# Patient Record
Sex: Female | Born: 1935 | Race: White | Hispanic: No | Smoking: Never smoker
Health system: Southern US, Community
[De-identification: ages and names within clinical notes are randomized; demographics above are authoritative.]

## PROBLEM LIST (undated history)

## (undated) DIAGNOSIS — T8859XA Other complications of anesthesia, initial encounter: Secondary | ICD-10-CM

## (undated) DIAGNOSIS — G629 Polyneuropathy, unspecified: Secondary | ICD-10-CM

## (undated) DIAGNOSIS — I10 Essential (primary) hypertension: Secondary | ICD-10-CM

## (undated) DIAGNOSIS — T4145XA Adverse effect of unspecified anesthetic, initial encounter: Secondary | ICD-10-CM

## (undated) DIAGNOSIS — E785 Hyperlipidemia, unspecified: Secondary | ICD-10-CM

## (undated) DIAGNOSIS — K589 Irritable bowel syndrome without diarrhea: Secondary | ICD-10-CM

## (undated) DIAGNOSIS — E039 Hypothyroidism, unspecified: Secondary | ICD-10-CM

## (undated) DIAGNOSIS — K219 Gastro-esophageal reflux disease without esophagitis: Secondary | ICD-10-CM

## (undated) DIAGNOSIS — Z87440 Personal history of urinary (tract) infections: Secondary | ICD-10-CM

## (undated) HISTORY — PX: BLADDER SURGERY: SHX569

## (undated) HISTORY — DX: Hypothyroidism, unspecified: E03.9

## (undated) HISTORY — DX: Gastro-esophageal reflux disease without esophagitis: K21.9

## (undated) HISTORY — DX: Essential (primary) hypertension: I10

## (undated) HISTORY — DX: Hyperlipidemia, unspecified: E78.5

## (undated) HISTORY — DX: Irritable bowel syndrome, unspecified: K58.9

## (undated) HISTORY — DX: Polyneuropathy, unspecified: G62.9

## (undated) HISTORY — PX: FOOT SURGERY: SHX648

## (undated) HISTORY — PX: MASTECTOMY: SHX3

## (undated) HISTORY — PX: NASAL SINUS SURGERY: SHX719

## (undated) HISTORY — PX: ABDOMINAL HYSTERECTOMY: SHX81

## (undated) HISTORY — DX: Personal history of urinary (tract) infections: Z87.440

---

## 1998-04-07 ENCOUNTER — Ambulatory Visit (HOSPITAL_BASED_OUTPATIENT_CLINIC_OR_DEPARTMENT_OTHER): Admission: RE | Admit: 1998-04-07 | Discharge: 1998-04-07 | Payer: Self-pay | Admitting: Plastic Surgery

## 1998-05-19 ENCOUNTER — Encounter (INDEPENDENT_AMBULATORY_CARE_PROVIDER_SITE_OTHER): Payer: Self-pay | Admitting: Gastroenterology

## 1998-05-19 ENCOUNTER — Ambulatory Visit (HOSPITAL_COMMUNITY): Admission: RE | Admit: 1998-05-19 | Discharge: 1998-05-19 | Payer: Self-pay | Admitting: Gastroenterology

## 1998-05-21 ENCOUNTER — Encounter: Payer: Self-pay | Admitting: Internal Medicine

## 1998-05-21 ENCOUNTER — Ambulatory Visit (HOSPITAL_COMMUNITY): Admission: RE | Admit: 1998-05-21 | Discharge: 1998-05-21 | Payer: Self-pay | Admitting: Internal Medicine

## 2000-02-20 ENCOUNTER — Ambulatory Visit (HOSPITAL_BASED_OUTPATIENT_CLINIC_OR_DEPARTMENT_OTHER): Admission: RE | Admit: 2000-02-20 | Discharge: 2000-02-20 | Payer: Self-pay | Admitting: Plastic Surgery

## 2000-04-04 ENCOUNTER — Encounter: Admission: RE | Admit: 2000-04-04 | Discharge: 2000-04-04 | Payer: Self-pay | Admitting: Endocrinology

## 2000-04-04 ENCOUNTER — Encounter: Payer: Self-pay | Admitting: Endocrinology

## 2000-09-25 ENCOUNTER — Encounter: Admission: RE | Admit: 2000-09-25 | Discharge: 2000-09-25 | Payer: Self-pay | Admitting: Gastroenterology

## 2000-09-25 ENCOUNTER — Encounter: Payer: Self-pay | Admitting: Gastroenterology

## 2001-06-04 ENCOUNTER — Encounter: Payer: Self-pay | Admitting: Emergency Medicine

## 2001-06-04 ENCOUNTER — Emergency Department (HOSPITAL_COMMUNITY): Admission: EM | Admit: 2001-06-04 | Discharge: 2001-06-04 | Payer: Self-pay | Admitting: Emergency Medicine

## 2001-07-04 ENCOUNTER — Encounter: Payer: Self-pay | Admitting: Otolaryngology

## 2001-07-04 ENCOUNTER — Encounter: Admission: RE | Admit: 2001-07-04 | Discharge: 2001-07-04 | Payer: Self-pay | Admitting: Otolaryngology

## 2001-07-09 ENCOUNTER — Encounter: Admission: RE | Admit: 2001-07-09 | Discharge: 2001-07-09 | Payer: Self-pay | Admitting: Pulmonary Disease

## 2001-07-09 ENCOUNTER — Encounter: Payer: Self-pay | Admitting: Pulmonary Disease

## 2001-09-19 ENCOUNTER — Encounter: Payer: Self-pay | Admitting: Pulmonary Disease

## 2001-09-19 ENCOUNTER — Ambulatory Visit (HOSPITAL_COMMUNITY): Admission: RE | Admit: 2001-09-19 | Discharge: 2001-09-19 | Payer: Self-pay | Admitting: Pulmonary Disease

## 2002-03-20 ENCOUNTER — Ambulatory Visit (HOSPITAL_COMMUNITY): Admission: RE | Admit: 2002-03-20 | Discharge: 2002-03-20 | Payer: Self-pay | Admitting: Pulmonary Disease

## 2002-03-20 ENCOUNTER — Encounter: Payer: Self-pay | Admitting: Pulmonary Disease

## 2002-06-05 ENCOUNTER — Encounter: Payer: Self-pay | Admitting: Gastroenterology

## 2002-06-05 ENCOUNTER — Ambulatory Visit (HOSPITAL_COMMUNITY): Admission: RE | Admit: 2002-06-05 | Discharge: 2002-06-05 | Payer: Self-pay | Admitting: Gastroenterology

## 2002-10-29 ENCOUNTER — Ambulatory Visit (HOSPITAL_COMMUNITY): Admission: RE | Admit: 2002-10-29 | Discharge: 2002-10-29 | Payer: Self-pay | Admitting: Pulmonary Disease

## 2002-10-29 ENCOUNTER — Encounter: Payer: Self-pay | Admitting: Pulmonary Disease

## 2002-12-26 ENCOUNTER — Inpatient Hospital Stay (HOSPITAL_COMMUNITY): Admission: AD | Admit: 2002-12-26 | Discharge: 2002-12-29 | Payer: Self-pay | Admitting: Emergency Medicine

## 2002-12-29 ENCOUNTER — Inpatient Hospital Stay: Admission: RE | Admit: 2002-12-29 | Discharge: 2003-01-08 | Payer: Self-pay | Admitting: Psychiatry

## 2003-01-13 ENCOUNTER — Encounter: Admission: RE | Admit: 2003-01-13 | Discharge: 2003-04-13 | Payer: Self-pay | Admitting: Endocrinology

## 2003-03-24 ENCOUNTER — Encounter (INDEPENDENT_AMBULATORY_CARE_PROVIDER_SITE_OTHER): Payer: Self-pay | Admitting: Specialist

## 2003-03-24 ENCOUNTER — Ambulatory Visit (HOSPITAL_COMMUNITY): Admission: RE | Admit: 2003-03-24 | Discharge: 2003-03-25 | Payer: Self-pay | Admitting: Otolaryngology

## 2003-05-07 ENCOUNTER — Ambulatory Visit (HOSPITAL_COMMUNITY): Admission: RE | Admit: 2003-05-07 | Discharge: 2003-05-07 | Payer: Self-pay | Admitting: Pulmonary Disease

## 2003-10-07 ENCOUNTER — Encounter (INDEPENDENT_AMBULATORY_CARE_PROVIDER_SITE_OTHER): Payer: Self-pay | Admitting: Gastroenterology

## 2003-10-07 DIAGNOSIS — K5730 Diverticulosis of large intestine without perforation or abscess without bleeding: Secondary | ICD-10-CM | POA: Insufficient documentation

## 2003-11-29 ENCOUNTER — Ambulatory Visit: Payer: Self-pay | Admitting: Pulmonary Disease

## 2003-11-29 ENCOUNTER — Ambulatory Visit: Payer: Self-pay | Admitting: Gastroenterology

## 2003-12-03 ENCOUNTER — Ambulatory Visit (HOSPITAL_COMMUNITY): Admission: RE | Admit: 2003-12-03 | Discharge: 2003-12-03 | Payer: Self-pay | Admitting: Gastroenterology

## 2003-12-14 ENCOUNTER — Ambulatory Visit: Payer: Self-pay | Admitting: Gastroenterology

## 2003-12-16 ENCOUNTER — Encounter: Admission: RE | Admit: 2003-12-16 | Discharge: 2003-12-16 | Payer: Self-pay | Admitting: Rheumatology

## 2004-02-17 ENCOUNTER — Encounter: Admission: RE | Admit: 2004-02-17 | Discharge: 2004-02-17 | Payer: Self-pay | Admitting: Endocrinology

## 2004-02-17 ENCOUNTER — Ambulatory Visit: Payer: Self-pay | Admitting: Pulmonary Disease

## 2004-06-01 ENCOUNTER — Ambulatory Visit: Payer: Self-pay | Admitting: Pulmonary Disease

## 2004-06-02 ENCOUNTER — Ambulatory Visit: Payer: Self-pay | Admitting: Cardiovascular Disease

## 2004-07-19 ENCOUNTER — Ambulatory Visit: Payer: Self-pay | Admitting: Internal Medicine

## 2004-07-26 ENCOUNTER — Encounter: Admission: RE | Admit: 2004-07-26 | Discharge: 2004-07-26 | Payer: Self-pay | Admitting: Endocrinology

## 2004-09-05 ENCOUNTER — Ambulatory Visit: Payer: Self-pay | Admitting: Internal Medicine

## 2004-10-05 ENCOUNTER — Ambulatory Visit: Payer: Self-pay | Admitting: Pulmonary Disease

## 2004-10-05 LAB — PULMONARY FUNCTION TEST

## 2004-10-27 ENCOUNTER — Ambulatory Visit: Payer: Self-pay | Admitting: Pulmonary Disease

## 2004-11-01 ENCOUNTER — Ambulatory Visit: Payer: Self-pay | Admitting: Pulmonary Disease

## 2004-11-29 ENCOUNTER — Ambulatory Visit: Payer: Self-pay | Admitting: Pulmonary Disease

## 2004-12-29 ENCOUNTER — Ambulatory Visit: Payer: Self-pay | Admitting: Pulmonary Disease

## 2005-01-05 ENCOUNTER — Ambulatory Visit: Payer: Self-pay | Admitting: Pulmonary Disease

## 2005-02-22 ENCOUNTER — Ambulatory Visit: Payer: Self-pay | Admitting: Emergency Medicine

## 2005-03-27 ENCOUNTER — Ambulatory Visit: Payer: Self-pay | Admitting: Pulmonary Disease

## 2005-04-24 ENCOUNTER — Ambulatory Visit: Payer: Self-pay | Admitting: Critical Care Medicine

## 2005-05-14 ENCOUNTER — Inpatient Hospital Stay (HOSPITAL_COMMUNITY): Admission: EM | Admit: 2005-05-14 | Discharge: 2005-05-16 | Payer: Self-pay | Admitting: Emergency Medicine

## 2005-05-15 ENCOUNTER — Encounter (INDEPENDENT_AMBULATORY_CARE_PROVIDER_SITE_OTHER): Payer: Self-pay | Admitting: Cardiovascular Disease

## 2005-07-06 ENCOUNTER — Ambulatory Visit: Payer: Self-pay | Admitting: Pulmonary Disease

## 2005-07-06 ENCOUNTER — Inpatient Hospital Stay (HOSPITAL_COMMUNITY): Admission: EM | Admit: 2005-07-06 | Discharge: 2005-07-16 | Payer: Self-pay | Admitting: *Deleted

## 2005-07-10 ENCOUNTER — Encounter (INDEPENDENT_AMBULATORY_CARE_PROVIDER_SITE_OTHER): Payer: Self-pay | Admitting: Cardiology

## 2005-07-10 ENCOUNTER — Ambulatory Visit: Payer: Self-pay | Admitting: Infectious Diseases

## 2005-07-26 ENCOUNTER — Ambulatory Visit: Payer: Self-pay | Admitting: Pulmonary Disease

## 2005-10-24 ENCOUNTER — Ambulatory Visit: Payer: Self-pay | Admitting: Pulmonary Disease

## 2005-11-26 ENCOUNTER — Ambulatory Visit: Payer: Self-pay | Admitting: Pulmonary Disease

## 2005-12-04 ENCOUNTER — Emergency Department (HOSPITAL_COMMUNITY): Admission: EM | Admit: 2005-12-04 | Discharge: 2005-12-04 | Payer: Self-pay | Admitting: Emergency Medicine

## 2005-12-18 ENCOUNTER — Ambulatory Visit (HOSPITAL_COMMUNITY): Admission: RE | Admit: 2005-12-18 | Discharge: 2005-12-18 | Payer: Self-pay | Admitting: Pulmonary Disease

## 2005-12-18 ENCOUNTER — Ambulatory Visit: Payer: Self-pay | Admitting: Pulmonary Disease

## 2005-12-31 ENCOUNTER — Ambulatory Visit: Payer: Self-pay | Admitting: Pulmonary Disease

## 2005-12-31 ENCOUNTER — Ambulatory Visit: Payer: Self-pay | Admitting: Internal Medicine

## 2006-01-24 ENCOUNTER — Ambulatory Visit: Payer: Self-pay | Admitting: Gastroenterology

## 2006-02-01 ENCOUNTER — Ambulatory Visit: Payer: Self-pay | Admitting: Internal Medicine

## 2006-03-06 ENCOUNTER — Ambulatory Visit: Payer: Self-pay | Admitting: Pulmonary Disease

## 2006-03-11 ENCOUNTER — Ambulatory Visit: Payer: Self-pay | Admitting: Pulmonary Disease

## 2006-03-11 ENCOUNTER — Ambulatory Visit (HOSPITAL_COMMUNITY): Admission: RE | Admit: 2006-03-11 | Discharge: 2006-03-11 | Payer: Self-pay | Admitting: Pulmonary Disease

## 2006-03-22 ENCOUNTER — Ambulatory Visit: Payer: Self-pay | Admitting: Cardiology

## 2006-03-28 ENCOUNTER — Ambulatory Visit: Payer: Self-pay | Admitting: Pulmonary Disease

## 2006-04-03 ENCOUNTER — Ambulatory Visit: Payer: Self-pay | Admitting: Pulmonary Disease

## 2006-04-24 ENCOUNTER — Ambulatory Visit: Payer: Self-pay | Admitting: Pulmonary Disease

## 2006-05-06 ENCOUNTER — Ambulatory Visit: Payer: Self-pay | Admitting: Pulmonary Disease

## 2006-05-30 ENCOUNTER — Ambulatory Visit: Payer: Self-pay | Admitting: Pulmonary Disease

## 2006-07-02 ENCOUNTER — Ambulatory Visit: Payer: Self-pay | Admitting: Pulmonary Disease

## 2006-08-06 ENCOUNTER — Ambulatory Visit: Payer: Self-pay | Admitting: Pulmonary Disease

## 2006-09-09 ENCOUNTER — Ambulatory Visit: Payer: Self-pay | Admitting: Internal Medicine

## 2006-11-01 ENCOUNTER — Ambulatory Visit: Payer: Self-pay | Admitting: Pulmonary Disease

## 2006-11-05 ENCOUNTER — Ambulatory Visit: Payer: Self-pay | Admitting: Gastroenterology

## 2006-12-16 ENCOUNTER — Ambulatory Visit: Payer: Self-pay | Admitting: Internal Medicine

## 2006-12-25 ENCOUNTER — Ambulatory Visit: Payer: Self-pay | Admitting: Gastroenterology

## 2006-12-25 LAB — CONVERTED CEMR LAB
Fecal Occult Blood: NEGATIVE
OCCULT 1: NEGATIVE
OCCULT 3: NEGATIVE
OCCULT 4: NEGATIVE
OCCULT 5: NEGATIVE

## 2007-01-01 DIAGNOSIS — J449 Chronic obstructive pulmonary disease, unspecified: Secondary | ICD-10-CM

## 2007-01-01 DIAGNOSIS — E039 Hypothyroidism, unspecified: Secondary | ICD-10-CM

## 2007-01-01 DIAGNOSIS — I10 Essential (primary) hypertension: Secondary | ICD-10-CM | POA: Insufficient documentation

## 2007-01-01 DIAGNOSIS — I519 Heart disease, unspecified: Secondary | ICD-10-CM

## 2007-01-01 DIAGNOSIS — J018 Other acute sinusitis: Secondary | ICD-10-CM

## 2007-01-01 DIAGNOSIS — K219 Gastro-esophageal reflux disease without esophagitis: Secondary | ICD-10-CM

## 2007-01-01 DIAGNOSIS — J309 Allergic rhinitis, unspecified: Secondary | ICD-10-CM | POA: Insufficient documentation

## 2007-01-01 DIAGNOSIS — K589 Irritable bowel syndrome without diarrhea: Secondary | ICD-10-CM

## 2007-01-02 ENCOUNTER — Telehealth: Payer: Self-pay | Admitting: Adult Health

## 2007-01-03 ENCOUNTER — Ambulatory Visit: Payer: Self-pay | Admitting: Pulmonary Disease

## 2007-01-03 DIAGNOSIS — J209 Acute bronchitis, unspecified: Secondary | ICD-10-CM | POA: Insufficient documentation

## 2007-01-10 ENCOUNTER — Emergency Department (HOSPITAL_COMMUNITY): Admission: EM | Admit: 2007-01-10 | Discharge: 2007-01-10 | Payer: Self-pay | Admitting: Emergency Medicine

## 2007-01-10 ENCOUNTER — Telehealth (INDEPENDENT_AMBULATORY_CARE_PROVIDER_SITE_OTHER): Payer: Self-pay | Admitting: *Deleted

## 2007-01-28 ENCOUNTER — Encounter: Payer: Self-pay | Admitting: Cardiology

## 2007-02-03 ENCOUNTER — Ambulatory Visit: Payer: Self-pay | Admitting: Pulmonary Disease

## 2007-02-03 DIAGNOSIS — J329 Chronic sinusitis, unspecified: Secondary | ICD-10-CM | POA: Insufficient documentation

## 2007-02-21 ENCOUNTER — Ambulatory Visit (HOSPITAL_COMMUNITY): Admission: RE | Admit: 2007-02-21 | Discharge: 2007-02-22 | Payer: Self-pay | Admitting: Otolaryngology

## 2007-02-21 ENCOUNTER — Encounter (INDEPENDENT_AMBULATORY_CARE_PROVIDER_SITE_OTHER): Payer: Self-pay | Admitting: Otolaryngology

## 2007-02-24 ENCOUNTER — Emergency Department (HOSPITAL_COMMUNITY): Admission: EM | Admit: 2007-02-24 | Discharge: 2007-02-24 | Payer: Self-pay | Admitting: Emergency Medicine

## 2007-03-11 ENCOUNTER — Emergency Department (HOSPITAL_COMMUNITY): Admission: EM | Admit: 2007-03-11 | Discharge: 2007-03-11 | Payer: Self-pay | Admitting: Emergency Medicine

## 2007-03-20 ENCOUNTER — Telehealth (INDEPENDENT_AMBULATORY_CARE_PROVIDER_SITE_OTHER): Payer: Self-pay | Admitting: *Deleted

## 2007-03-25 ENCOUNTER — Ambulatory Visit: Payer: Self-pay | Admitting: Pulmonary Disease

## 2007-04-15 ENCOUNTER — Ambulatory Visit: Payer: Self-pay | Admitting: Pulmonary Disease

## 2007-04-21 ENCOUNTER — Ambulatory Visit: Payer: Self-pay | Admitting: Pulmonary Disease

## 2007-05-09 ENCOUNTER — Ambulatory Visit: Payer: Self-pay | Admitting: Pulmonary Disease

## 2007-05-09 ENCOUNTER — Telehealth: Payer: Self-pay | Admitting: Pulmonary Disease

## 2007-05-26 ENCOUNTER — Telehealth (INDEPENDENT_AMBULATORY_CARE_PROVIDER_SITE_OTHER): Payer: Self-pay | Admitting: *Deleted

## 2007-05-26 ENCOUNTER — Encounter: Payer: Self-pay | Admitting: Pulmonary Disease

## 2007-06-05 ENCOUNTER — Telehealth (INDEPENDENT_AMBULATORY_CARE_PROVIDER_SITE_OTHER): Payer: Self-pay | Admitting: *Deleted

## 2007-06-13 ENCOUNTER — Ambulatory Visit: Payer: Self-pay | Admitting: Pulmonary Disease

## 2007-06-25 ENCOUNTER — Telehealth (INDEPENDENT_AMBULATORY_CARE_PROVIDER_SITE_OTHER): Payer: Self-pay | Admitting: *Deleted

## 2007-06-25 ENCOUNTER — Ambulatory Visit: Payer: Self-pay | Admitting: Pulmonary Disease

## 2007-06-30 ENCOUNTER — Telehealth: Payer: Self-pay | Admitting: Pulmonary Disease

## 2007-06-30 ENCOUNTER — Ambulatory Visit: Payer: Self-pay | Admitting: Pulmonary Disease

## 2007-06-30 DIAGNOSIS — R079 Chest pain, unspecified: Secondary | ICD-10-CM | POA: Insufficient documentation

## 2007-08-01 ENCOUNTER — Ambulatory Visit: Payer: Self-pay | Admitting: Pulmonary Disease

## 2007-09-02 ENCOUNTER — Ambulatory Visit: Payer: Self-pay | Admitting: Pulmonary Disease

## 2007-09-25 ENCOUNTER — Ambulatory Visit: Payer: Self-pay | Admitting: Pulmonary Disease

## 2007-10-06 ENCOUNTER — Ambulatory Visit: Payer: Self-pay | Admitting: Pulmonary Disease

## 2007-11-10 ENCOUNTER — Ambulatory Visit: Payer: Self-pay | Admitting: Pulmonary Disease

## 2007-11-28 ENCOUNTER — Encounter: Admission: RE | Admit: 2007-11-28 | Discharge: 2007-11-28 | Payer: Self-pay | Admitting: Endocrinology

## 2007-12-04 ENCOUNTER — Ambulatory Visit: Payer: Self-pay | Admitting: Pulmonary Disease

## 2008-01-06 ENCOUNTER — Ambulatory Visit: Payer: Self-pay | Admitting: Internal Medicine

## 2008-01-29 ENCOUNTER — Ambulatory Visit: Payer: Self-pay | Admitting: Pulmonary Disease

## 2008-02-25 ENCOUNTER — Ambulatory Visit: Payer: Self-pay | Admitting: Pulmonary Disease

## 2008-02-25 DIAGNOSIS — J45909 Unspecified asthma, uncomplicated: Secondary | ICD-10-CM | POA: Insufficient documentation

## 2008-03-03 ENCOUNTER — Ambulatory Visit: Payer: Self-pay | Admitting: Pulmonary Disease

## 2008-03-05 DIAGNOSIS — J45909 Unspecified asthma, uncomplicated: Secondary | ICD-10-CM | POA: Insufficient documentation

## 2008-04-06 ENCOUNTER — Ambulatory Visit: Payer: Self-pay | Admitting: Pulmonary Disease

## 2008-04-30 ENCOUNTER — Ambulatory Visit: Payer: Self-pay | Admitting: Pulmonary Disease

## 2008-06-02 ENCOUNTER — Ambulatory Visit: Payer: Self-pay | Admitting: Pulmonary Disease

## 2008-06-30 ENCOUNTER — Ambulatory Visit: Payer: Self-pay | Admitting: Pulmonary Disease

## 2008-07-21 ENCOUNTER — Emergency Department (HOSPITAL_COMMUNITY): Admission: EM | Admit: 2008-07-21 | Discharge: 2008-07-21 | Payer: Self-pay

## 2008-07-30 ENCOUNTER — Encounter: Payer: Self-pay | Admitting: Internal Medicine

## 2008-07-30 ENCOUNTER — Ambulatory Visit: Payer: Self-pay | Admitting: Pulmonary Disease

## 2008-08-26 ENCOUNTER — Ambulatory Visit: Payer: Self-pay | Admitting: Pulmonary Disease

## 2008-08-30 ENCOUNTER — Emergency Department (HOSPITAL_COMMUNITY): Admission: EM | Admit: 2008-08-30 | Discharge: 2008-08-30 | Payer: Self-pay | Admitting: Emergency Medicine

## 2008-09-28 ENCOUNTER — Ambulatory Visit: Payer: Self-pay | Admitting: Pulmonary Disease

## 2008-10-06 ENCOUNTER — Encounter: Payer: Self-pay | Admitting: Cardiology

## 2008-11-01 ENCOUNTER — Ambulatory Visit: Payer: Self-pay | Admitting: Pulmonary Disease

## 2008-11-03 ENCOUNTER — Telehealth: Payer: Self-pay | Admitting: Gastroenterology

## 2008-11-05 ENCOUNTER — Encounter: Payer: Self-pay | Admitting: Gastroenterology

## 2008-11-30 ENCOUNTER — Telehealth (INDEPENDENT_AMBULATORY_CARE_PROVIDER_SITE_OTHER): Payer: Self-pay | Admitting: *Deleted

## 2008-12-01 ENCOUNTER — Ambulatory Visit: Payer: Self-pay | Admitting: Pulmonary Disease

## 2008-12-06 ENCOUNTER — Telehealth (INDEPENDENT_AMBULATORY_CARE_PROVIDER_SITE_OTHER): Payer: Self-pay | Admitting: *Deleted

## 2008-12-15 ENCOUNTER — Encounter: Admission: RE | Admit: 2008-12-15 | Discharge: 2008-12-15 | Payer: Self-pay | Admitting: Gastroenterology

## 2009-01-03 ENCOUNTER — Ambulatory Visit: Payer: Self-pay | Admitting: Pulmonary Disease

## 2009-02-01 ENCOUNTER — Ambulatory Visit: Payer: Self-pay | Admitting: Pulmonary Disease

## 2009-03-03 ENCOUNTER — Ambulatory Visit: Payer: Self-pay | Admitting: Pulmonary Disease

## 2009-03-29 ENCOUNTER — Ambulatory Visit: Payer: Self-pay | Admitting: Pulmonary Disease

## 2009-03-29 DIAGNOSIS — J019 Acute sinusitis, unspecified: Secondary | ICD-10-CM

## 2009-03-31 ENCOUNTER — Telehealth (INDEPENDENT_AMBULATORY_CARE_PROVIDER_SITE_OTHER): Payer: Self-pay | Admitting: *Deleted

## 2009-05-05 ENCOUNTER — Ambulatory Visit: Payer: Self-pay | Admitting: Pulmonary Disease

## 2009-05-13 ENCOUNTER — Encounter: Admission: RE | Admit: 2009-05-13 | Discharge: 2009-05-13 | Payer: Self-pay | Admitting: Neurology

## 2009-05-13 ENCOUNTER — Encounter: Payer: Self-pay | Admitting: Pulmonary Disease

## 2009-05-18 ENCOUNTER — Telehealth: Payer: Self-pay | Admitting: Pulmonary Disease

## 2009-05-20 ENCOUNTER — Encounter: Admission: RE | Admit: 2009-05-20 | Discharge: 2009-05-20 | Payer: Self-pay | Admitting: Neurology

## 2009-06-01 ENCOUNTER — Ambulatory Visit: Payer: Self-pay | Admitting: Pulmonary Disease

## 2009-07-04 ENCOUNTER — Ambulatory Visit: Payer: Self-pay | Admitting: Pulmonary Disease

## 2009-08-01 ENCOUNTER — Ambulatory Visit: Payer: Self-pay | Admitting: Pulmonary Disease

## 2009-08-03 ENCOUNTER — Ambulatory Visit: Payer: Self-pay | Admitting: Pulmonary Disease

## 2009-08-03 ENCOUNTER — Telehealth (INDEPENDENT_AMBULATORY_CARE_PROVIDER_SITE_OTHER): Payer: Self-pay | Admitting: *Deleted

## 2009-08-03 ENCOUNTER — Telehealth: Payer: Self-pay | Admitting: Pulmonary Disease

## 2009-08-03 ENCOUNTER — Ambulatory Visit: Payer: Self-pay | Admitting: Cardiology

## 2009-08-03 DIAGNOSIS — R0602 Shortness of breath: Secondary | ICD-10-CM | POA: Insufficient documentation

## 2009-08-03 DIAGNOSIS — R071 Chest pain on breathing: Secondary | ICD-10-CM

## 2009-08-03 LAB — CONVERTED CEMR LAB
BUN: 15 mg/dL (ref 6–23)
CO2: 29 meq/L (ref 19–32)
Calcium: 9.1 mg/dL (ref 8.4–10.5)
Chloride: 97 meq/L (ref 96–112)
Potassium: 4.1 meq/L (ref 3.5–5.1)
Sodium: 132 meq/L — ABNORMAL LOW (ref 135–145)

## 2009-09-05 ENCOUNTER — Ambulatory Visit: Payer: Self-pay | Admitting: Pulmonary Disease

## 2009-10-10 ENCOUNTER — Ambulatory Visit: Payer: Self-pay | Admitting: Pulmonary Disease

## 2009-10-18 ENCOUNTER — Ambulatory Visit: Payer: Self-pay | Admitting: Pulmonary Disease

## 2009-11-08 ENCOUNTER — Ambulatory Visit: Payer: Self-pay | Admitting: Pulmonary Disease

## 2010-01-24 ENCOUNTER — Ambulatory Visit
Admission: RE | Admit: 2010-01-24 | Discharge: 2010-01-24 | Payer: Self-pay | Source: Home / Self Care | Attending: Pulmonary Disease | Admitting: Pulmonary Disease

## 2010-02-04 ENCOUNTER — Encounter: Payer: Self-pay | Admitting: Gastroenterology

## 2010-02-05 ENCOUNTER — Encounter: Payer: Self-pay | Admitting: Endocrinology

## 2010-02-14 NOTE — Assessment & Plan Note (Signed)
Summary: Joanna [name]   Nurse Visit   Allergies: 1)  ! Sulfa 2)  ! Biaxin 3)  ! Prednisone 4)  ! * Amitriptyline 5)  ! Caffeine 6)  ! Codeine 7)  ! Ceftin 8)  ! * Latex  Medication Administration  Injection # 1:    Medication: Xolair (omalizumab) 150mg     Diagnosis: EXTRINSIC ASTHMA, UNSPECIFIED (ICD-493.00)    Route: SQ    Site: L deltoid    Exp Date: 05/15/2012    Lot #: [id number]    Mfr: GENENTECH    Comments: 1.2 ML IN LEFT PT DIDNT WAIT CHARGED [id number] AND [id number]    Given by: Dimas Millin IN ALLERGY LAB   Medication Administration  Injection # 1:    Medication: Xolair (omalizumab) 150mg     Diagnosis: EXTRINSIC ASTHMA, UNSPECIFIED (ICD-493.00)    Route: SQ    Site: L deltoid    Exp Date: 05/15/2012    Lot #: [id number]    Mfr: GENENTECH    Comments: 1.2 ML IN LEFT PT DIDNT WAIT CHARGED [id number] AND [id number]    Given by: Babette Relic SCOTT IN ALLERGY LAB

## 2010-02-14 NOTE — Assessment & Plan Note (Signed)
Summary: rov for asthma   Visit Type:  Follow-up Primary Provider/Referring Provider:  Dr. Lucianne Muss  CC:  follow up. Pt states her breathing is getting better. Pt states she now has very little sob with activity and very little sob at rest. Pt states she occasionally gets a bad coughing spell wih green-yellow phlem. Pt states symbicort helps her out a lot. Pt states she still has that spot on her back that bothers her. Pt states she would like flu shot today.  Marland Kitchen  History of Present Illness: The pt comes in today for f/u of her known asthma.  She is being maintained on symbicort, as well as xolair injections.  She also has a h/o chronic rhinosinusitis, but has been better on xolair and sinus rinses.  She feels that her breathing is stable, denies any worsening sob.  She has occasional cough with scant discolored mucus.  Current Medications (verified): 1)  Synthroid 125 Mcg Tabs (Levothyroxine Sodium) .... Take 1/2 Tab Once Daily 2)  Neurontin 300 Mg  Caps (Gabapentin) .... Take 2 Tabs By Mouth Three Times A Day 3)  Calcium 600 1500 Mg  Tabs (Calcium Carbonate) .... Take 2 Tablet By Mouth Once A Day 4)  Amlodipine Besylate 5 Mg Tabs (Amlodipine Besylate) .... Take 1 Tab By Mouth Daily 5)  Toprol Xl 50 Mg  Tb24 (Metoprolol Succinate) .... Take 1 Tablet By Mouth Once A Day 6)  Fiber Complete   Tabs (Fiber) .... Take 4 To 6 Tabs By Mouth Once Daily 7)  Proair Hfa 108 (90 Base) Mcg/act  Aers (Albuterol Sulfate) .... Inhale 2 Puffs Four Times A Day As Needed 8)  Symbicort 160-4.5 Mcg/act  Aero (Budesonide-Formoterol Fumarate) .... Two Puffs Twice Daily..rinse Mouth 9)  Sinus Rinse .... Use Two Times A Day 10)  Simvastatin 5 Mg  Tabs (Simvastatin) .... Take 1 Tablet By Mouth Once A Day 11)  Aspirin Low Dose 81 Mg  Tabs (Aspirin) .... Take 1 Tablet By Mouth Once A Day 12)  Alprazolam .... Pt Unsure of Strength. Take 1 Tab By Mouth At Bedtime 13)  Omeprazole .... Take 1 Tablet By Mouth Once A Day 14)   Losartan Potassium 50 Mg Tabs (Losartan Potassium) .... Take 1 Tablet By Mouth Once A Day  Allergies (verified): 1)  ! Sulfa 2)  ! Biaxin 3)  ! Prednisone 4)  ! * Amitriptyline 5)  ! Caffeine 6)  ! Codeine 7)  ! Ceftin 8)  ! * Latex  Review of Systems       The patient complains of shortness of breath with activity, shortness of breath at rest, productive cough, acid heartburn, difficulty swallowing, nasal congestion/difficulty breathing through nose, sneezing, and joint stiffness or pain.  The patient denies non-productive cough, coughing up blood, chest pain, irregular heartbeats, indigestion, loss of appetite, weight change, abdominal pain, sore throat, tooth/dental problems, headaches, itching, ear ache, anxiety, depression, hand/feet swelling, rash, change in color of mucus, and fever.    Vital Signs:  Patient profile:   77 year old female Height:      67 inches Weight:      145.25 pounds BMI:     22.83 O2 Sat:      100 % on Room air Temp:     97.7 degrees F oral Pulse rate:   70 / minute BP sitting:   120 / 64  (left arm) Cuff size:   regular  Vitals Entered By: Carver Fila (October 18, 2009 11:19 AM)  O2 Flow:  Room air CC: follow up. Pt states her breathing is getting better. Pt states she now has very little sob with activity and very little sob at rest. Pt states she occasionally gets a bad coughing spell wih green-yellow phlem. Pt states symbicort helps her out a lot. Pt states she still has that spot on her back that bothers her. Pt states she would like flu shot today.   Comments meds and allergies updated Phone number updated Carver Fila  October 18, 2009 11:19 AM    Physical Exam  General:  wd female in nad Nose:  no purulence or drainage noted. Mouth:  clear, no thrush Lungs:  totally clear to auscultation, no wheezing Heart:  rrr, no mrg Extremities:  no edema or cyanosis  Neurologic:  alert and oriented, moves all 4.   Impression &  Recommendations:  Problem # 1:  INTRINSIC ASTHMA, UNSPECIFIED (ICD-493.10) the pt is doing fairly well on her current asthma regimen.  She has breakthru at times, but is only using symbicort once a day.  I have asked her to use twice a day, but I think she is concerned about the expense.  I discussed with her the pt assistance program.  I have asked her to also stay on the xolair, and to keep as active as possible.  Medications Added to Medication List This Visit: 1)  Amlodipine Besylate 5 Mg Tabs (Amlodipine besylate) .... Take 1 tab by mouth daily  Other Orders: Est. Patient Level III (16109) Flu Vaccine 17yrs + MEDICARE PATIENTS (U0454) Administration Flu vaccine - MCR (U9811)  Patient Instructions: 1)  stay on symbicort, but take twice a day!! 2)  if you are having a hard time affording the symbicort, let us know and we can look into the patient assistance program 3)  stay on xolair 4)  followup with me in 6mos.   Immunization History:  Pneumovax Immunization History:    Pneumovax:  historical (10/15/2005)       Flu Vaccine Consent Questions     Do you have a history of severe allergic reactions to this vaccine? no    Any prior history of allergic reactions to egg and/or gelatin? no    Do you have a sensitivity to the preservative Thimersol? no    Do you have a past history of Guillan-Barre Syndrome? no    Do you currently have an acute febrile illness? no    Have you ever had a severe reaction to latex? no    Vaccine information given and explained to patient? yes    Are you currently pregnant? no    Lot Number:[id number]   Exp Date:07/15/2010   Manufacturer: [employer]    Site Given  Left Deltoid IMlu Vernie Murders  October 18, 2009 12:09 PM

## 2010-02-14 NOTE — Assessment & Plan Note (Signed)
Summary: xolair///kp   Nurse Visit   Allergies: 1)  ! Sulfa 2)  ! Biaxin 3)  ! Prednisone 4)  ! * Amitriptyline 5)  ! Caffeine 6)  ! Codeine 7)  ! Ceftin 8)  ! * Latex  Medication Administration  Injection # 1:    Medication: Xolair (omalizumab) 150mg     Diagnosis: EXTRINSIC ASTHMA, UNSPECIFIED (ICD-493.00)    Route: SQ    Site: L deltoid    Exp Date: 01/2012    Lot #: [id number]    Mfr: Mendel Ryder    Comments: Injection given by Dimas Millin in allergy lab. Xolair 150mg . 1.38ml x 1 in Left Deltoid. Pt did not wait.   Orders Added: 1)  Admin of Therapeutic Inj  intramuscular or subcutaneous [96372] 2)  Xolair (omalizumab) 150mg  [J2357]

## 2010-02-14 NOTE — Progress Notes (Signed)
Summary: SOB-req to be seen- call back asap  Phone Note Call from Patient Call back at [facility] Phone [phone]   Caller: Patient Call For: clance Summary of Call: pt states that she's in "a lot of pain" and wants to be seen today- wants to see kc for SOB. says she thinks it's pleurisy- pain under L shoulder and behind her back- has had this before. wants a call back asap this am.  Initial call taken by: Tivis Ringer, CNA,  August 03, 2009 11:18 AM  Follow-up for Phone Call        megan pt is requesting to see [facility] today---please advise Randell Loop CMA  August 03, 2009 11:19 AM    [facility], do you want to see this pt today or have another physican see pt since you have a dr's appt today?  Aundra Millet Reynolds LPN  August 03, 2009 11:58 AM   per [facility], add her on at 2pm today and cxr prior to appt.  Aundra Millet Reynolds LPN  August 03, 2009 12:00 PM   Additional Follow-up for Phone Call Additional follow up Details #1::        Pt aware of appt time and knows to be at the office by 130 and go to xray first. Appts are in [facility].Reynaldo Minium CMA  August 03, 2009 12:08 PM

## 2010-02-14 NOTE — Assessment & Plan Note (Signed)
Summary: xolair///kp   Nurse Visit   Allergies: 1)  ! Sulfa 2)  ! Biaxin 3)  ! Prednisone 4)  ! * Amitriptyline 5)  ! Caffeine 6)  ! Codeine 7)  ! Ceftin 8)  ! * Latex  Medication Administration  Injection # 1:    Medication: Xolair (omalizumab) 150mg     Diagnosis: EXTRINSIC ASTHMA, UNSPECIFIED (ICD-493.00)    Route: IM    Site: R deltoid    Exp Date: 08/15/2012    Lot #: [id number]    Mfr: Genetech    Comments: xolair 150 mg, 30 units, 1.2 ml in Right deltoid Pt waited 20 mins.    Given by: Carver Fila, clinical medical assistant  Orders Added: 1)  Xolair (omalizumab) 150mg  [J2357] 2)  Administration xolair injection [76811]   Medication Administration  Injection # 1:    Medication: Xolair (omalizumab) 150mg     Diagnosis: EXTRINSIC ASTHMA, UNSPECIFIED (ICD-493.00)    Route: IM    Site: R deltoid    Exp Date: 08/15/2012    Lot #: [id number]    Mfr: Genetech    Comments: xolair 150 mg, 30 units, 1.2 ml in Right deltoid Pt waited 20 mins.    Given by: Carver Fila, clinical medical assistant  Orders Added: 1)  Xolair (omalizumab) 150mg  [J2357] 2)  Administration xolair injection [phone]

## 2010-02-14 NOTE — Assessment & Plan Note (Signed)
Summary: acute sick visit for chest pain.   Primary Provider/Referring Provider:  Dr. Lucianne Muss  CC:  Pt is here for a sick visit.   Pt c/o L sided back pain x 1 week.  Pt states pain is worse if she tries to take a deep breath or cough.  Pt also c/o coughing up yellow to green sputum but states it is difficult to cough up. Marland Kitchen  History of Present Illness: the pt comes in today for an acute sick visit.  She noted the onset of pleuritic like chest pain starting one week ago.  It was gradual in onset, but has worsened and is persistent.  The pain is posterior on the left, and under the left scapula.  It is worse with deep breathing, but she is unsure if movement makes it worse.  She can press on the area and somewhat reproduce the pain.  She also has had a cough with scant discolored mucus, but no chest congestion or fever.  She does not feel she is developing a chest cold.  She has had no LE edema or calf pain, nor has she been on a long trip where she was sitting for a prolonged period of time.    Current Medications (verified): 1)  Synthroid 125 Mcg Tabs (Levothyroxine Sodium) .... Take 1/2 Tab Once Daily 2)  Neurontin 300 Mg  Caps (Gabapentin) .... Take 2 Tabs By Mouth Three Times A Day 3)  Calcium 600 1500 Mg  Tabs (Calcium Carbonate) .... Take 2 Tablet By Mouth Once A Day 4)  Amlodipine Besylate 5 Mg Tabs (Amlodipine Besylate) .... Take 1/2 Tab By Mouth Daily 5)  Toprol Xl 50 Mg  Tb24 (Metoprolol Succinate) .... Take 1 Tablet By Mouth Once A Day 6)  Fiber Complete   Tabs (Fiber) .... Take 4 To 6 Tabs By Mouth Once Daily 7)  Proair Hfa 108 (90 Base) Mcg/act  Aers (Albuterol Sulfate) .... Inhale 2 Puffs Four Times A Day As Needed 8)  Symbicort 160-4.5 Mcg/act  Aero (Budesonide-Formoterol Fumarate) .... Two Puffs Twice Daily..rinse Mouth 9)  Sinus Rinse .... Use Two Times A Day 10)  Simvastatin 5 Mg  Tabs (Simvastatin) .... Take 1 Tablet By Mouth Once A Day 11)  Aspirin Low Dose 81 Mg  Tabs (Aspirin)  .... Take 1 Tablet By Mouth Once A Day 12)  Alprazolam .... Pt Unsure of Strength. Take 1 Tab By Mouth At Bedtime 13)  Omeprazole .... Take 1 Tablet By Mouth Once A Day 14)  Losartan Potassium 50 Mg Tabs (Losartan Potassium) .... Take 1 Tablet By Mouth Once A Day  Allergies (verified): 1)  ! Sulfa 2)  ! Biaxin 3)  ! Prednisone 4)  ! * Amitriptyline 5)  ! Caffeine 6)  ! Codeine 7)  ! Ceftin 8)  ! * Latex  Past History:  Past medical, surgical, family and social histories (including risk factors) reviewed, and no changes noted (except as noted below).  Past Medical History: Reviewed history from 09/28/2008 and no changes required.  IRRITABLE BOWEL SYNDROME (ICD-564.1) HYPOTHYROIDISM (ICD-244.9) GERD (ICD-530.81) HYPERTENSION (ICD-401.9) Hx of RHINOSINUSITIS, ACUTE (ICD-461.8) ALLERGIC RHINITIS (ICD-477.9) ASTHMA (ICD-493.90)    Family History: Reviewed history and no changes required.  Social History: Reviewed history from 03/29/2009 and no changes required. Marital Status: widowed Children: 2 sons Occupation: unemployeed  Review of Systems       The patient complains of shortness of breath at rest, non-productive cough, and chest pain.  The patient denies shortness  of breath with activity, coughing up blood, irregular heartbeats, acid heartburn, indigestion, loss of appetite, weight change, abdominal pain, difficulty swallowing, sore throat, tooth/dental problems, headaches, sneezing, itching, ear ache, anxiety, depression, hand/feet swelling, joint stiffness or pain, rash, fever, productive cough, nasal congestion/difficulty breathing through nose, and change in color of mucus.    Vital Signs:  Patient profile:   77 year old female Height:      67 inches Weight:      142.38 pounds BMI:     22.38 O2 Sat:      96 % on Room air Temp:     97.7 degrees F oral Pulse rate:   77 / minute BP sitting:   104 / 60  (left arm) Cuff size:   regular  Vitals Entered By: Arman Filter LPN (August 03, 2009 1:59 PM)  O2 Flow:  Room air CC: Pt is here for a sick visit.   Pt c/o L sided back pain x 1 week.  Pt states pain is worse if she tries to take a deep breath or cough.  Pt also c/o coughing up yellow to green sputum but states it is difficult to cough up.  Comments Medications reviewed with patient Arman Filter LPN  August 03, 2009 1:59 PM    Physical Exam  General:  thin female in nad Nose:  no purulence or drainage noted. Lungs:  splinting to the left due to pain clear lung fields except for mild left basilar crackles. Heart:  rrr, no mrg Extremities:  no edema or calf tenderness, varicosities present  Neurologic:  alert and oriented, moves all 4.   Impression & Recommendations:  Problem # 1:  CHEST PAIN, PLEURITIC (ICD-786.52) the pt is having acute onset of pleuritic chest pain that I suspect is MSK in origin, but I think we need to r/o PE and other pleural processes given the severity of her symptoms and increased sob.  Her plain cxr today shows no acute process.  Will check ct chest, and call her with the results.  If unremarkable, would treat this with heat and NSAIDS.  Medications Added to Medication List This Visit: 1)  Amlodipine Besylate 5 Mg Tabs (Amlodipine besylate) .... Take 1/2 tab by mouth daily 2)  Losartan Potassium 50 Mg Tabs (Losartan potassium) .... Take 1 tablet by mouth once a day  Other Orders: Est. Patient Level IV (82956) Radiology Referral (Radiology) TLB-BMP (Basic Metabolic Panel-BMET) (80048-METABOL) T-2 View CXR (71020TC)  Patient Instructions: 1)  will check ct scan of chest to exclude blood clots and other things within the chest.  If negative, I suspect this is in the chest wall.

## 2010-02-14 NOTE — Assessment & Plan Note (Signed)
Summary: xolair///kp   Nurse Visit   Allergies: 1)  ! Sulfa 2)  ! Biaxin 3)  ! Prednisone 4)  ! * Amitriptyline 5)  ! Caffeine 6)  ! Codeine 7)  ! Ceftin 8)  ! * Latex  Medication Administration  Injection # 1:    Medication: Xolair (omalizumab) 150mg     Diagnosis: 493.00    Route: SQ    Site: R deltoid    Exp Date: 07/2012    Lot #: [id number]    Mfr: Salome Spotted    Comments: 1.2 ML IN RIGHT ARM PT DIDNT WAIT CHARGED [id number] AND [id number]    Given by: Dimas Millin IN ALLERGY LAB   Medication Administration  Injection # 1:    Medication: Xolair (omalizumab) 150mg     Diagnosis: 493.00    Route: SQ    Site: R deltoid    Exp Date: 07/2012    Lot #: [id number]    Mfr: Salome Spotted    Comments: 1.2 ML IN RIGHT ARM PT DIDNT WAIT CHARGED [id number] AND 47829    Given by: Dimas Millin IN ALLERGY LAB

## 2010-02-14 NOTE — Progress Notes (Signed)
Summary: CT scan results  Phone Note Other Incoming   Caller: [name], CT    ex  [phone] Summary of Call: received call from [facility].  [name] had pt over in the office currently as pt  just finished having CT.  KC not in the office this PM and therefore had MW look at CT scan. MW ok'd for pt leave and we would call her with results.  Relayed this information to [facility] who informed pt.  Will forward message to [facility] as an FYI.  Arman Filter LPN  August 03, 2009 5:16 PM  Initial call taken by: Arman Filter LPN,  August 03, 2009 5:16 PM  Follow-up for Phone Call        noted Follow-up by: Barbaraann Share MD,  August 03, 2009 6:06 PM

## 2010-02-14 NOTE — Progress Notes (Signed)
Summary: talk to nurse  Phone Note Call from Patient Call back at Home Phone [phone]   Caller: Patient Call For: clance Summary of Call: patient took one pill of her avelox that dr clance told her to take. she said she was "out of her head", she was seeing all kinds of colors, she passed out, and she couldnt hardly walk. she thinks this was side effects from the avelox. what should she do. she said she wasnt going to take abother pill until she talked to someone about this.  Initial call taken by: Valinda Hoar,  March 31, 2009 12:36 PM  Follow-up for Phone Call        pt states she was having all these symptoms prior to starting the avelox advised pt to continue avelox and dr clance is aware, also advised pt she should call her primary about the other symptoms she is having Follow-up by: Philipp Deputy CMA,  March 31, 2009 2:22 PM

## 2010-02-14 NOTE — Assessment & Plan Note (Signed)
Summary: xolair///kp   Nurse Visit   Allergies: 1)  ! Sulfa 2)  ! Biaxin 3)  ! Prednisone 4)  ! * Amitriptyline 5)  ! Caffeine 6)  ! Codeine 7)  ! Ceftin 8)  ! * Latex  Medication Administration  Injection # 1:    Medication: Xolair (omalizumab) 150mg     Diagnosis: EXTRINSIC ASTHMA, UNSPECIFIED (ICD-493.00)    Route: SQ    Site: R deltoid    Exp Date: 03/2012    Lot #: [id number]    Mfr: Mendel Ryder    Comments: Injection given by Dimas Millin in allergy lab. Xolair 150mg . 1.42ml x 1 in Right Deltoid. Pt waited 15 minutes.     Patient tolerated injection without complications  Orders Added: 1)  Admin of Therapeutic Inj  intramuscular or subcutaneous [96372] 2)  Xolair (omalizumab) 150mg  [J2357]

## 2010-02-14 NOTE — Assessment & Plan Note (Signed)
Summary: xolair/klw   Nurse Visit   Allergies: 1)  ! Sulfa 2)  ! Biaxin 3)  ! Prednisone 4)  ! * Amitriptyline 5)  ! Caffeine 6)  ! Codeine 7)  ! Ceftin 8)  ! * Latex  Medication Administration  Injection # 1:    Medication: Xolair (omalizumab) 150mg     Diagnosis: EXTRINSIC ASTHMA, UNSPECIFIED (ICD-493.00)    Route: IM    Site: R deltoid    Exp Date: 10/15/2012    Lot #: [id number]    Mfr: Genetech    Comments: xolair 150 mg , 30 units, 1.2 ml x 1 in Right deltoid. Pt waited 20 mins.     Given by: Drucie Opitz, CMA  Orders Added: 1)  Xolair (omalizumab) 150mg  [J2357] 2)  Administration xolair injection [phone]

## 2010-02-14 NOTE — Assessment & Plan Note (Signed)
Summary: xolair/jd   Nurse Visit   Allergies: 1)  ! Sulfa 2)  ! Biaxin 3)  ! Prednisone 4)  ! * Amitriptyline 5)  ! Caffeine 6)  ! Codeine 7)  ! Ceftin 8)  ! * Latex  Medication Administration  Injection # 1:    Medication: Xolair (omalizumab) 150mg     Diagnosis: EXTRINSIC ASTHMA, UNSPECIFIED (ICD-493.00)    Route: IM    Site: L deltoid    Exp Date: 10/15/2012    Lot #: [id number]    Mfr: Genetech    Comments: xolair 150 mg , 30 units, 1.2 ml in lrft deltoid Pt waited 10 minutes    Given by: Dimas Millin, Allergy tech  Orders Added: 1)  Xolair (omalizumab) 150mg  [J2357] 2)  Administration xolair injection [phone]

## 2010-02-14 NOTE — Progress Notes (Signed)
Summary: sinus problem---MRI report   Phone Note Call from Patient Call back at Home Phone [phone]   Caller: Patient Call For: Cadan Maggart Summary of Call: patient need to talk to nurse, not really sure what she needed. i believe that she needs a medicine to replace avelox. she never took it when she seen dr Karem Farha in march, she said she was having an reaction to it.  [facility] Initial call taken by: Valinda Hoar,  May 18, 2009 12:15 PM  Follow-up for Phone Call        Patient talked in circles and sounds very confused. She says she had a recent MRI and was told she had a sinus infection and would need to call KC. I offered her appt on 05/19/2009 with [facility] but the patient said she would have to callback after talking with her son.Michel Bickers CMA  May 18, 2009 12:34 PM  Spoke with pt.  She states that she is unable to come in for an appt this wk. due to transportation issues.  She is c/o "sinus infection"- facial pressure, HA, green nasal d/c.  States that she needs abx, but wants KC to know that she " could'nt even walk taking avelox".  Please advise, thanks! Follow-up by: Vernie Murders,  May 18, 2009 2:52 PM  Additional Follow-up for Phone Call Additional follow up Details #1::        we need to find out where she had her xray, and get the report to me (thanks).   can call in omnicef 300mg  2 each am for 10 days. let her know she may need to go see ENT if she is having persistent sinus symptoms Additional Follow-up by: Barbaraann Share MD,  May 18, 2009 5:51 PM    Additional Follow-up for Phone Call Additional follow up Details #2::    pt called back, wanted to know if anything had been called in for her.  Said to please call and notify her if ithas.  Call back number given is still good. Follow-up by: Eugene Gavia,  May 19, 2009 8:35 AM  Additional Follow-up for Phone Call Additional follow up Details #3:: Details for Additional Follow-up Action Taken: The patient is aware  of abx RX and to call if she continues to have problems with her sinuses. She also had verbal understanding that if she does continue to have problems she may need to see an ENT. The patient had an MRI of the brain at [facility] and I have printed the report and given to Dr. Shelle Iron.Michel Bickers CMA  May 19, 2009 9:18 AM  let her know that I have reviewed mri report, and the abx will hopefully take care of this. Additional Follow-up by: Barbaraann Share MD,  May 19, 2009 5:59 PM  New/Updated Medications: CEFDINIR 300 MG CAPS (CEFDINIR) 2 by mouth every morning for 10 days Prescriptions: CEFDINIR 300 MG CAPS (CEFDINIR) 2 by mouth every morning for 10 days  #20 x 0   Entered by:   Michel Bickers CMA   Authorized by:   Barbaraann Share MD   Signed by:   Michel Bickers CMA on 05/19/2009   Method used:   Electronically to        [facility] [address] [id number] (retail)       [address] [place], [place]  [address]       Ph: [phone]  Fax: [phone]   RxID:   [id number]  pt advised. Carron Curie CMA  May 20, 2009 9:00 AM

## 2010-02-14 NOTE — Miscellaneous (Signed)
Summary: Injection Record / [facility]    Injection Record / [facility]    Imported By: Lennie Odor 06/06/2009 14:57:43  _____________________________________________________________________  External Attachment:    Type:   Image     Comment:   External Document

## 2010-02-14 NOTE — Assessment & Plan Note (Signed)
Summary: xolair/mhh   Nurse Visit   Allergies: 1)  ! Sulfa 2)  ! Biaxin 3)  ! Prednisone 4)  ! * Amitriptyline 5)  ! Caffeine 6)  ! Codeine 7)  ! Ceftin 8)  ! * Latex  Medication Administration  Injection # 1:    Medication: Xolair (omalizumab) 150mg     Diagnosis: EXTRINSIC ASTHMA, UNSPECIFIED (ICD-493.00)    Route: SQ    Site: R deltoid    Exp Date: 03/17/2012    Lot #: [id number]    Mfr: GENENTECH    Comments: 1.2 ML IN RIGHT ARM PT DIDNT WAIT    Given by: TAMMY SCOTT IN [facility]  Orders Added: 1)  Xolair (omalizumab) 150mg  [J2357] 2)  Administration xolair injection [id number]   Medication Administration  Injection # 1:    Medication: Xolair (omalizumab) 150mg     Diagnosis: EXTRINSIC ASTHMA, UNSPECIFIED (ICD-493.00)    Route: SQ    Site: R deltoid    Exp Date: 03/17/2012    Lot #: [id number]    Mfr: GENENTECH    Comments: 1.2 ML IN RIGHT ARM PT DIDNT WAIT    Given by: TAMMY SCOTT IN [facility]  Orders Added: 1)  Xolair (omalizumab) 150mg  [J2357] 2)  Administration xolair injection [[id number]]

## 2010-02-14 NOTE — Assessment & Plan Note (Signed)
Summary: rov for asthma, acute sinusitis   Visit Type:  Follow-up Primary Provider/Referring Provider:  Dr. Lucianne Muss  CC:  Pt here for 6 month follow up. Pt c/o sneezing, productive  cough yellow to green mucus, and sinus pressure x 6 weeks.  History of Present Illness: The pt comes in today for f/u of her known intrinsic and extrinsic asthma.  She has been doing very well on xolair, with no recent acute exacerbation.  Most recently however, she has had persistent nasal congestion, sinus pressure, and is washing purulent material from her nose with her sinus rinses.  She denies any fever.  Her breathing has not been an issue, and denies any significant cough.  Current Medications (verified): 1)  Synthroid 125 Mcg Tabs (Levothyroxine Sodium) .... Take 1/2 Tab Once Daily 2)  Neurontin 300 Mg  Caps (Gabapentin) .... Take 2 Tabs By Mouth Three Times A Day 3)  Calcium 600 1500 Mg  Tabs (Calcium Carbonate) .... Take 2 Tablet By Mouth Once A Day 4)  Amlodipine Besylate 5 Mg Tabs (Amlodipine Besylate) .... Take 1 Tablet By Mouth Once A Day 5)  Toprol Xl 50 Mg  Tb24 (Metoprolol Succinate) .... Take 1 Tablet By Mouth Once A Day 6)  Fiber Complete   Tabs (Fiber) .... Take 4 To 6 Tabs By Mouth Once Daily 7)  Proair Hfa 108 (90 Base) Mcg/act  Aers (Albuterol Sulfate) .... Inhale 2 Puffs Four Times A Day As Needed 8)  Symbicort 160-4.5 Mcg/act  Aero (Budesonide-Formoterol Fumarate) .... Two Puffs Twice Daily..rinse Mouth 9)  Sinus Rinse .... Use Two Times A Day 10)  Simvastatin 5 Mg  Tabs (Simvastatin) .... Take 1 Tablet By Mouth Once A Day 11)  Aspirin Low Dose 81 Mg  Tabs (Aspirin) .... Take 1 Tablet By Mouth Once A Day 12)  Alprazolam .... Pt Unsure of Strength. Take 1 Tab By Mouth At Bedtime 13)  Omeprazole .... Take 1 Tablet By Mouth Once A Day  Allergies (verified): 1)  ! Sulfa 2)  ! Biaxin 3)  ! Prednisone 4)  ! * Amitriptyline 5)  ! Caffeine 6)  ! Codeine 7)  ! Ceftin 8)  ! * Latex  Social  History: Marital Status: widowed Children: 2 sons Occupation: unemployeed  Review of Systems      See HPI  Vital Signs:  Patient profile:   77 year old female Height:      67 inches Weight:      147.50 pounds O2 Sat:      93 % on Room air Temp:     98.1 degrees F oral Pulse rate:   69 / minute BP sitting:   124 / 82  (left arm) Cuff size:   regular  Vitals Entered By: Zackery Barefoot CMA (March 29, 2009 11:50 AM)  O2 Flow:  Room air CC: Pt here for 6 month follow up. Pt c/o sneezing, productive  cough yellow to green mucus, sinus pressure x 6 weeks Comments Medications reviewed with patient Verified contact number and pharmacy with patient Zackery Barefoot CMA  March 29, 2009 11:51 AM    Physical Exam  General:  wd female in nad severe nasal voice Nose:  crusting noted bilat. Lungs:  mildly decreased bs, no wheezing or rhonchi Heart:  rrr, no mrg Extremities:  no edema or cyanosis Neurologic:  alert and oriented, moves all 4.   Impression & Recommendations:  Problem # 1:  SINUSITIS, ACUTE (ICD-461.9) the pt most likely has acute on  chronic sinusitis, and will need a course of abx.  I have asked her to continue with her nasal hygiene regimen as well.  Problem # 2:  INTRINSIC ASTHMA, UNSPECIFIED (ICD-493.10) the pt has known severe asthma.  She has been doing very well on xolair, with no recent acute exacerbations.  She is to continue on symbicort.  Medications Added to Medication List This Visit: 1)  Calcium 600 1500 Mg Tabs (Calcium carbonate) .... Take 2 tablet by mouth once a day 2)  Alprazolam  .... Pt unsure of strength. take 1 tab by mouth at bedtime 3)  Omeprazole  .... Take 1 tablet by mouth once a day  Other Orders: Est. Patient Level III (10272)  Patient Instructions: 1)  stay on symbicort 2)  will treat sinus infection with avelox 400mg  one a day for 7days 3)  continue sinus rinses 4)  can try sudafed 60mg  one in am and pm while having sinus  congestion, but no more than one week. 5)  followup with me in 6mos or sooner if problems.    Immunization History:  Influenza Immunization History:    Influenza:  historical (10/18/2008)    Appended Document: Orders Update     Clinical Lists Changes  Orders: Added new Service order of Xolair (omalizumab) 150mg  (Z3664) - Signed Added new Service order of Admin of Therapeutic Inj  intramuscular or subcutaneous (40347) - Signed       Medication Administration  Injection # 1:    Medication: Xolair (omalizumab) 150mg     Diagnosis: EXTRINSIC ASTHMA, UNSPECIFIED (ICD-493.00)    Route: SQ    Site: left arm    Exp Date: 05/2012    Lot #: [id number]    Mfr: Mendel Ryder    Comments: Xolair 150mg  1.2 mL left arm Given by Drucie Opitz, CMA in the allergy lab Pt seen by MD after injection for office visit  Orders Added: 1)  Xolair (omalizumab) 150mg  [J2357] 2)  Admin of Therapeutic Inj  intramuscular or subcutaneous [[id number]]

## 2010-02-14 NOTE — Assessment & Plan Note (Signed)
Summary: xolair/apc   Nurse Visit   Allergies: 1)  ! Sulfa 2)  ! Biaxin 3)  ! Prednisone 4)  ! * Amitriptyline 5)  ! Caffeine 6)  ! Codeine 7)  ! Ceftin 8)  ! * Latex  Medication Administration  Injection # 1:    Medication: Xolair (omalizumab) 150mg     Diagnosis: EXTRINSIC ASTHMA, UNSPECIFIED (ICD-493.00)    Route: SQ    Site: L deltoid    Exp Date: 05/15/2012    Lot #: [id number]    Mfr: genentech    Comments: 1.2ML IN LEFT ARM PT WAITED 15 MINS    Patient tolerated injection without complications    Given by: TAMMY SCOTT IN [facility] LAB  Orders Added: 1)  Xolair (omalizumab) 150mg  [J2357] 2)  Administration xolair injection [[id number]]   Medication Administration  Injection # 1:    Medication: Xolair (omalizumab) 150mg     Diagnosis: EXTRINSIC ASTHMA, UNSPECIFIED (ICD-493.00)    Route: SQ    Site: L deltoid    Exp Date: 05/15/2012    Lot #: [id number]    Mfr: genentech    Comments: 1.2ML IN LEFT ARM PT WAITED 15 MINS    Patient tolerated injection without complications    Given by: TAMMY SCOTT IN [facility]ER[facility] LAB  Orders Added: 1)  Xolair (omalizumab) 150mg  [J2357] 2)  Administration xolair injection [[id number]]

## 2010-02-16 NOTE — Assessment & Plan Note (Signed)
Summary: Joanna [name] ///kp   Nurse Visit   Allergies: 1)  ! Sulfa 2)  ! Biaxin 3)  ! Prednisone 4)  ! * Amitriptyline 5)  ! Caffeine 6)  ! Codeine 7)  ! Ceftin 8)  ! * Latex  Medication Administration  Injection # 1:    Medication: Xolair (omalizumab) 150mg     Diagnosis: EXTRINSIC ASTHMA, UNSPECIFIED (ICD-493.00)    Route: SQ    Site: R deltoid    Exp Date: 01/2012    Lot #: [id number]    Mfr: Genetech    Comments: 1.2ML X RIGHT ARM 150 ML CHARGED J2357 AND 47829    Patient tolerated injection without complications    Given by: SUSANNE FORD IN [facility]ERGY LAB  Orders Added: 1)  Xolair (omalizumab) 150mg  [J2357] 2)  Administration xolair injection [[id number]]   Medication Administration  Injection # 1:    Medication: Xolair (omalizumab) 150mg     Diagnosis: EXTRINSIC ASTHMA, UNSPECIFIED (ICD-493.00)    Route: SQ    Site: R deltoid    Exp Date: 01/2012    Lot #: [id number]    Mfr: Genetech    Comments: 1.2ML X RIGHT ARM 150 ML CHARGED [id number]    Patient tolerated injection without complications    Given by: SUSANNE FORD IN [facility]ERGY LAB  Orders Added: 1)  Xolair (omalizumab) 150mg  [J2357] 2)  Administration xolair injection [[id number]]

## 2010-03-16 ENCOUNTER — Telehealth (INDEPENDENT_AMBULATORY_CARE_PROVIDER_SITE_OTHER): Payer: Self-pay | Admitting: *Deleted

## 2010-03-22 ENCOUNTER — Encounter: Payer: Self-pay | Admitting: Pulmonary Disease

## 2010-03-22 ENCOUNTER — Ambulatory Visit (INDEPENDENT_AMBULATORY_CARE_PROVIDER_SITE_OTHER): Payer: Medicare Other

## 2010-03-22 DIAGNOSIS — J45909 Unspecified asthma, uncomplicated: Secondary | ICD-10-CM

## 2010-03-23 NOTE — Progress Notes (Signed)
  Phone Note Other Incoming   Request: Send information Summary of Call: Request for records received from Dr. Willis Modena with [facility]. Faxed Colon from 10/07/2003 and Panendoscopy from 05/19/1998 to [phone] along with a copy of the [facility] Release.

## 2010-03-27 ENCOUNTER — Ambulatory Visit: Payer: Medicare Other | Admitting: Adult Health

## 2010-03-27 ENCOUNTER — Emergency Department (HOSPITAL_COMMUNITY): Payer: Medicare Other

## 2010-03-27 ENCOUNTER — Emergency Department (HOSPITAL_COMMUNITY)
Admission: EM | Admit: 2010-03-27 | Discharge: 2010-03-27 | Disposition: A | Discharge status: Home / Self Care | Payer: Medicare Other | Attending: Emergency Medicine | Admitting: Emergency Medicine

## 2010-03-27 DIAGNOSIS — Z853 Personal history of malignant neoplasm of breast: Secondary | ICD-10-CM | POA: Insufficient documentation

## 2010-03-27 DIAGNOSIS — R079 Chest pain, unspecified: Secondary | ICD-10-CM | POA: Insufficient documentation

## 2010-03-27 DIAGNOSIS — R0602 Shortness of breath: Secondary | ICD-10-CM | POA: Insufficient documentation

## 2010-03-27 DIAGNOSIS — E039 Hypothyroidism, unspecified: Secondary | ICD-10-CM | POA: Insufficient documentation

## 2010-03-27 DIAGNOSIS — R0989 Other specified symptoms and signs involving the circulatory and respiratory systems: Secondary | ICD-10-CM | POA: Insufficient documentation

## 2010-03-27 DIAGNOSIS — R0609 Other forms of dyspnea: Secondary | ICD-10-CM | POA: Insufficient documentation

## 2010-03-27 DIAGNOSIS — J189 Pneumonia, unspecified organism: Secondary | ICD-10-CM | POA: Insufficient documentation

## 2010-03-27 DIAGNOSIS — I10 Essential (primary) hypertension: Secondary | ICD-10-CM | POA: Insufficient documentation

## 2010-03-27 LAB — DIFFERENTIAL
Basophils Absolute: 0 10*3/uL (ref 0.0–0.1)
Eosinophils Absolute: 0.1 10*3/uL (ref 0.0–0.7)
Lymphs Abs: 1.6 10*3/uL (ref 0.7–4.0)
Monocytes Relative: 7 % (ref 3–12)
Neutro Abs: 3.8 10*3/uL (ref 1.7–7.7)
Neutrophils Relative %: 64 % (ref 43–77)

## 2010-03-27 LAB — PROTIME-INR: INR: 0.98 (ref 0.00–1.49)

## 2010-03-27 LAB — BASIC METABOLIC PANEL
CO2: 28 mEq/L (ref 19–32)
Calcium: 9.3 mg/dL (ref 8.4–10.5)
Creatinine, Ser: 0.93 mg/dL (ref 0.4–1.2)
GFR calc non Af Amer: 58 mL/min — ABNORMAL LOW (ref >60)
Potassium: 3.7 mEq/L (ref 3.5–5.1)
Sodium: 134 mEq/L — ABNORMAL LOW (ref 135–145)

## 2010-03-27 LAB — CBC
HCT: 39.9 % (ref 36.0–46.0)
Platelets: 235 10*3/uL (ref 150–400)

## 2010-03-27 LAB — BRAIN NATRIURETIC PEPTIDE: Pro B Natriuretic peptide (BNP): 96.7 pg/mL (ref 0.0–100.0)

## 2010-03-27 MED ORDER — IOHEXOL 300 MG/ML  SOLN
100.0000 mL | Freq: Once | INTRAMUSCULAR | Status: AC | PRN
  Administered 2010-03-27: 100 mL via INTRAVENOUS

## 2010-03-28 NOTE — Assessment & Plan Note (Signed)
Summary: Geoff[facility] INJ//SH   Nurse Visit   Allergies: 1)  ! Sulfa 2)  ! Biaxin 3)  ! Prednisone 4)  ! * Amitriptyline 5)  ! Caffeine 6)  ! Codeine 7)  ! Ceftin 8)  ! * Latex  Medication Administration  Injection # 1:    Medication: Xolair (omalizumab) 150mg     Diagnosis: EXTRINSIC ASTHMA, UNSPECIFIED (ICD-493.00)    Route: SQ    Site: L deltoid    Exp Date: 01/2012    Lot #: [id number]    Mfr: Genetech    Comments: 1.2 ML IN LEFT ARM 150 MG CHARGED J2357 AND 52841    Given by: Dimas Millin IN ALLERGY LAB  Orders Added: 1)  Xolair (omalizumab) 150mg  [J2357] 2)  Administration xolair injection [id number]   Medication Administration  Injection # 1:    Medication: Xolair (omalizumab) 150mg     Diagnosis: EXTRINSIC ASTHMA, UNSPECIFIED (ICD-493.00)    Route: SQ    Site: L deltoid    Exp Date: 01/2012    Lot #: [id number]    Mfr: Genetech    Comments: 1.2 ML IN LEFT ARM 150 MG CHARGED J2357 AND 02725    Given by: Babette Relic SCOTT IN ALLERGY LAB  Orders Added: 1)  Xolair (omalizumab) 150mg  [J2357] 2)  Administration xolair injection [[id number]]

## 2010-04-18 ENCOUNTER — Ambulatory Visit: Payer: Self-pay | Admitting: Pulmonary Disease

## 2010-04-18 ENCOUNTER — Encounter: Payer: Self-pay | Admitting: Pulmonary Disease

## 2010-04-20 ENCOUNTER — Encounter: Payer: Self-pay | Admitting: Pulmonary Disease

## 2010-04-20 ENCOUNTER — Ambulatory Visit (INDEPENDENT_AMBULATORY_CARE_PROVIDER_SITE_OTHER): Payer: Medicare Other

## 2010-04-20 ENCOUNTER — Ambulatory Visit (INDEPENDENT_AMBULATORY_CARE_PROVIDER_SITE_OTHER): Payer: Medicare Other | Admitting: Pulmonary Disease

## 2010-04-20 DIAGNOSIS — J449 Chronic obstructive pulmonary disease, unspecified: Secondary | ICD-10-CM

## 2010-04-20 DIAGNOSIS — J4489 Other specified chronic obstructive pulmonary disease: Secondary | ICD-10-CM

## 2010-04-20 DIAGNOSIS — J479 Bronchiectasis, uncomplicated: Secondary | ICD-10-CM

## 2010-04-20 DIAGNOSIS — J45909 Unspecified asthma, uncomplicated: Secondary | ICD-10-CM

## 2010-04-20 MED ORDER — LEVOFLOXACIN 750 MG PO TABS: 750.0000 mg | ORAL_TABLET | Freq: Every day | ORAL | Status: AC

## 2010-04-20 MED ORDER — OMALIZUMAB 150 MG ~~LOC~~ SOLR
150.0000 mg | Freq: Once | SUBCUTANEOUS | Status: AC
  Administered 2010-04-20: 150 mg via SUBCUTANEOUS

## 2010-04-20 NOTE — Patient Instructions (Signed)
Will treat with levaquin 750mg  one each day for 7days mucinex dm extrastrength one in am and pm with large glass of water, for 7days Stay on breathing meds.  followup with me in 4mos

## 2010-04-20 NOTE — Assessment & Plan Note (Signed)
Her doe is at her usual baseline, and no bronchospasm on exam today.  I have asked her to continue with her current meds.

## 2010-04-20 NOTE — Assessment & Plan Note (Addendum)
The pt has had a recent acute exac of her bronchiectasis, and was treated with zpak.  She is still congested and bringing up purulent mucus.  Suspect the zpak was not adequate given her chronic lung disease with recurrent infections.  Will treat with a course of levaquin.  Will also need to keep in mind whether her sinusitis may be playing a role in this.

## 2010-04-20 NOTE — Progress Notes (Signed)
  Subjective:    Patient ID: Joanna [name], female    DOB: [birth year], 77 y.o.   MRN: [id number]  HPI The pt comes in today for an acute sick visit.  She has know copd with asthmatic component, and most recently came to ER with mild hemoptysis.  Ct chest showed scattered areas of bronchiectasis, primarily in lower lobes L>> R, with increased infiltrate in LLL.  She was treated with what sounds like zpak, but has continued to have purulent mucus but no further hemoptysis.  She is still congested.  She denies any significant worsening of her sob.    Review of Systems  Constitutional: Negative for fever and unexpected weight change.  HENT: Positive for congestion, rhinorrhea, sneezing, postnasal drip and sinus pressure. Negative for ear pain, nosebleeds, sore throat, trouble swallowing and dental problem.   Eyes: Positive for itching. Negative for redness.  Respiratory: Positive for cough, chest tightness, shortness of breath and wheezing.   Cardiovascular: Negative for palpitations and leg swelling.  Gastrointestinal: Negative for nausea and vomiting.  Genitourinary: Negative for dysuria.  Musculoskeletal: Negative for joint swelling.  Skin: Negative for rash.  Neurological: Negative for headaches.  Hematological: Does not bruise/bleed easily.  Psychiatric/Behavioral: Positive for dysphoric mood. The patient is nervous/anxious.        Objective:   Physical Exam Wd female in nad Chest with left basilar crackles, no wheezing, adequate airflow Cor with rrr LE without edema, +varicosities Neuro: alert and oriented, moves all 4        Assessment & Plan:

## 2010-04-22 ENCOUNTER — Encounter: Payer: Self-pay | Admitting: Pulmonary Disease

## 2010-04-22 LAB — CBC
HCT: 35.5 % — ABNORMAL LOW (ref 36.0–46.0)
Hemoglobin: 12.1 g/dL (ref 12.0–15.0)
MCHC: 34.1 g/dL (ref 30.0–36.0)
RBC: 3.99 MIL/uL (ref 3.87–5.11)
RDW: 13 % (ref 11.5–15.5)

## 2010-04-22 LAB — DIFFERENTIAL
Basophils Absolute: 0 10*3/uL (ref 0.0–0.1)
Basophils Relative: 1 % (ref 0–1)
Eosinophils Relative: 1 % (ref 0–5)
Monocytes Absolute: 0.6 10*3/uL (ref 0.1–1.0)
Monocytes Relative: 8 % (ref 3–12)

## 2010-04-22 LAB — BASIC METABOLIC PANEL
CO2: 25 mEq/L (ref 19–32)
Chloride: 95 mEq/L — ABNORMAL LOW (ref 96–112)
Glucose, Bld: 96 mg/dL (ref 70–99)
Potassium: 3.7 mEq/L (ref 3.5–5.1)
Sodium: 128 mEq/L — ABNORMAL LOW (ref 135–145)

## 2010-04-22 LAB — POCT CARDIAC MARKERS
CKMB, poc: 1.5 ng/mL (ref 1.0–8.0)
Myoglobin, poc: 83.8 ng/mL (ref 12–200)
Troponin i, poc: <0.05 ng/mL (ref 0.00–0.09)
Troponin i, poc: <0.05 ng/mL (ref 0.00–0.09)

## 2010-05-01 ENCOUNTER — Encounter: Payer: Self-pay | Admitting: Pulmonary Disease

## 2010-05-09 ENCOUNTER — Encounter: Payer: Self-pay | Admitting: Internal Medicine

## 2010-05-30 NOTE — Assessment & Plan Note (Signed)
[facility]                         GASTROENTEROLOGY OFFICE NOTE   KAMREE, WIENS                       MRN:          [id number]  DATE:11/05/2006                            DOB:          [birth year]    Joanna [name] is doing extremely well on a high fiber diet with 2 fiber capsules  3 times a day.  She has had minimal abdominal complaints and is having  regular bowel movements.  Her vital signs were all stable, as is her  abdominal exam, except for some tenderness to deep palpation over her  sigmoid colon.   Joanna [name] obviously has symptomatic diverticulosis and uses p.r.n. Robinul  Forte, which I have asked her to continue along with her fiber  supplements.  We will check hemoccult cards.  If these are negative, she  does not need routine colonoscopy at this time.     Vania Rea. Jarold Motto, MD, Caleen Essex, FAGA  Electronically Signed    DRP/MedQ  DD: 11/05/2006  DT: 11/06/2006  Job #: [id number]

## 2010-05-30 NOTE — H&P (Signed)
NAME:  Joanna [name], Joanna [name] NO.:  [id number]   MEDICAL RECORD NO.:  [id number]          PATIENT TYPE:  OIB   LOCATION:  5152                         FACILITY:  [facility]   PHYSICIAN:  Hermelinda Medicus, M.D.   DATE OF BIRTH:  1928-02-07   DATE OF ADMISSION:  02/21/2007  DATE OF DISCHARGE:  02/22/2007                              HISTORY & PHYSICAL   This patient is a 77 year old female who has been under my care for  sinusitis on several occasions.  She had surgery back in 2005 and did  really quite well until just recently when she was admitted to the  hospital after having a bad sinus infection by Dr. Lucianne Muss with a  pneumonia, and was treated as an in hospital person for several days she  did quite well.  But then also over Christmas, which was just 1-1/2  months ago was in the emergency room where Dr. Donnetta Hutching saw her for  severe headache with postnasal drainage and sinusitis. once again.  A  CAT scan was done showing considerable sinusitis of her bilateral  maxillary inner right ethmoid.  She was treated, once again, and she  continues to have difficulties.  She has had several episodes over the  past year, and has been on antibiotics including Ceftin and Levaquin.  She has developed some allergies to [facility], SULFA, and also to CODEINE;  but now her latest CAT scan shows continuing bilateral maxillary and  right ethmoid sinusitis, and she now enters for functional endoscopic  sinus surgery.  She has also had a workup recently by Dr. Ronny Flurry  with a stress test and a complete evaluation for atrial fibrillation,  and has been cleared for surgery.  She had been experiencing some  shortness of breath with exertion and has had this felt to be most  likely secondary to chronic obstructive pulmonary disease, but her chest  x-ray shows no progressive disease.  She now enters for sinus surgery.   CURRENT MEDICATIONS:  Listed in the chart.   ALLERGIES:  Primarily those of  CODEINE, AMITRIPTYLINE, SULFA, BIAXIN,  SELDANE and TEGRETOL.   PHARYNX:  She is seen also Dr. Marcelino Freestone in 2005 with peripheral  neuropathy; and, of course, she has been seen by Dr. Lucianne Muss for her  asthma and pneumonia.  She has also had a hysterectomy in the past. has  had a hemorrhoidectomy, and bilateral mastectomies with implants.  She  has also had sinus surgery, in the past, under my care.   REVIEW OF SYSTEMS:  She has some nocturia x2.  A pulmonary disease, as  above-mentioned.  She has never had a TIA or a stroke.  She had a  negative brain scan.  She does not smoke or drink other than some  coffee.   PHYSICAL EXAMINATION:  GENERAL:  She is a well-nourished, well-developed  female in no acute distress.  VITAL SIGNS:  Blood pressure 130/80.  Her pulse is 60.  HEENT:  The ears are clear.  Tympanic membranes are clear.  The nose,  however, does show scar tissue on the  right ethmoid where it is just  blocked, essentially; it does not drain; and she shows purulent drainage  out of both maxillary sinuses on scoping.  She has some drainage down  the back of her throat and has a chronic cough with mucus, and has been  recently on Levaquin to try to get this sinus issue under control, as  well as nasal sprays,. and mild decongestants.  CARDIAC:  EKGs and chest x-ray are well stated in Dr. Yevonne Pax note,  and he she has had a complete stress test with the EKG showing atrial  fibrillation.  A normal stress nuclear study with no diagnostic ST and T-  wave abnormalities.  She also showed an ejection fraction of 75%.  ABDOMEN:  Unremarkable.  EXTREMITIES:  Unremarkable.   INITIAL DIAGNOSIS:  1. Bilateral maxillary AND right ethmoid sinusitis.  2. History of persistent respiratory issues with A history of      pneumonia and bronchitis.  3. History of allergies to medications above mentioned.  4. History of bilateral mastectomies with reconstruction.  5. History of atrial  fibrillation.  6. History of allergic rhinitis.  7. History OF hysterectomy.  8. Bladder tack.  9. Hemorrhoidectomy.           ______________________________  Hermelinda Medicus, M.D.     JC/MEDQ  D:  02/21/2007  T:  02/22/2007  Job:  [id number]   cc:   Reather Littler, M.D.  Cassell Clement, M.D.

## 2010-05-30 NOTE — Op Note (Signed)
NAME:  Joanna [name], Joanna [name]                ACCOUNT NO.:  [id number]   MEDICAL RECORD NO.:  [id number]          PATIENT TYPE:  OIB   LOCATION:  5152                         FACILITY:  [facility]   PHYSICIAN:  Hermelinda Medicus, M.D.   DATE OF BIRTH:  [birth year]   DATE OF PROCEDURE:  02/21/2007  DATE OF DISCHARGE:  02/22/2007                               OPERATIVE REPORT   PREOPERATIVE DIAGNOSES:  1. Bilateral maxillary sinusitis.  2. Right ethmoid sinusitis with associated bronchitis.  3. A history of pneumonia.   POSTOPERATIVE DIAGNOSES:  1. Bilateral maxillary sinusitis.  2. Right ethmoid sinusitis with associated bronchitis.  3. A history of pneumonia.   OPERATION:  1. A functional endoscopic sinus surgery.  2. Right ethmoidectomy with bilateral maxillary sinus ostial      enlargements.  3. Drainage of mucopurulent debris.   OPERATOR:  Hermelinda Medicus, M.D.   ANESTHESIA:  Local MAC with Janetta Hora. Gelene Mink, M.D.   PROCEDURE:  The patient was placed in the supine position under local  MAC anesthesia, 1% Xylocaine with epinephrine 5 mL, and topical cocaine  200 mg.  The nose was anesthetized.  The right ethmoid sinus was first  approached.  The middle turbinate, after examination, was pushed medial  and the ethmoid sinus was blocked with scar tissue and had some  drainage.   This was opened using the straight upbiting Shann Medal looking  through the 0-degree scope.  Once this was opened, the remainder of the  sinus in the more posterior was in quite decent shape.  Superiorly we  worked up toward the frontal sinus where this was also blocked.  Once  this was completed, we then worked toward the left maxillary sinus which  was very much scarred at the natural ostium, and we were able to use the  curved suction to find this natural ostium and then we used the  backbiting forceps to increase this small sized natural ostium.   We then used the angled Shann Medal to increase it  further.  Purulent drainage was coming from this, we suctioned it; but now that we  have it open, I would expect this were would clear with just the  antibiotics in use.  The right side had similar findings where the  maxillary sinus ostium was blocked.  It was opened with the backbiting  forceps using the 0-degree scope, once again; and then we used the  angled Shann Medal as well.  Once this was completed, we then  suctioned the sinus.  The remainder of the sinuses looked to be in good  condition, as they did on x-ray and on CAT scan.   The patient then had Gelfoam placed in the right ethmoid.  Telfa within  each nostril.  She will be kept overnight because of her primary past  cardiovascular history, and as a 23-hour observation.  Her follow up  then will be in 5 days, 10 days, 3 weeks, 6 weeks, 3 months, 6 months,  and a year.           ______________________________  Hermelinda Medicus, M.D.  JC/MEDQ  D:  02/21/2007  T:  02/22/2007  Job:  [id number]   cc:   Cassell Clement, M.D.  Reather Littler, M.D.

## 2010-05-31 ENCOUNTER — Telehealth: Payer: Self-pay | Admitting: Pulmonary Disease

## 2010-05-31 NOTE — Telephone Encounter (Signed)
Spoke with our rep for xolair and he states that this is not correct. Rep advised that there would be advanced notice if this occurred and all offices would be notified well in advance. I contacted pt back and advised her of this. Advised patient that Medicare is still covering Xolair.

## 2010-05-31 NOTE — Telephone Encounter (Signed)
I haven't heard anything about Medicare not paying for xolair injections. I'm checking into this and will advise patient once I hear. Rhonda Cobb

## 2010-05-31 NOTE — Telephone Encounter (Signed)
Spoke w/ pt and she states Medicare has been slow about paying for her xolair injections. Pt states she can't afford to pay this out of pocket. I advised pt if she received a letter stating they are not going to cover her xolair and she states no she has not. Pt states she saw on the news mediare may no longer cover xolair. I advised pt until she receives a letter stating Joanna [name] is not going to cover her xolair then she does not have anything to worry about. Pt still wanted to know if medicare is going to cover her xolair before she gets her next injection. Please advise Joanna [name] on what needs to be done if anything. Thanks  Carver Fila, CMA

## 2010-06-02 NOTE — H&P (Signed)
NAME:  Joanna [name], Joanna [name]                          ACCOUNT NO.:  [id number]   MEDICAL RECORD NO.:  [id number]                   PATIENT TYPE:  OIB   LOCATION:  5703                                 FACILITY:  [facility]   PHYSICIAN:  Hermelinda Medicus, M.D.                DATE OF BIRTH:  [birth year]   DATE OF ADMISSION:  03/24/2003  DATE OF DISCHARGE:                                HISTORY & PHYSICAL   HISTORY OF PRESENT ILLNESS:  This patient is a 77 year old female who has  had persistent respiratory problems.  She has been coughing up cloudy,  yellow-green material that has been unresolving.  She has also had  persistent sinus problems where she has had drainage down the back of her  throat of similar material.  She has been on antibiotics on several  occasions.  Her rhonchi and asthma has considerable difficulty with  drainage, with control, and her most recent antibiotics being Augmentin XR,  1000 mg b.i.d. for 10 days.  She has also been treated with multiple  medications for her lungs, and with also some mild decongestants.  Some of  her pulmonary medications include 5 mg of prednisone.  She had been on  Allegra.  She has been also taking other medications primarily for her blood  pressure and cardiovascular status.  At this point, however, Dr. Shelle Iron  feels the sinus problems are the major causative factors to her respiratory  difficulties.  Her chest x-ray showed chronic bronchitis.  We obtained a CT  scan of her sinuses after multiple treatments, and it showed bilateral  maxillary sinus fluid levels with ethmoid sinusitis with sphenoid sinusitis.   On examination, even in the face of antibiotics, we can see purulent  drainage in the posterior ethmoid region on both sides, and she now enters  for functional endoscopic sinus surgery revision.  She has had functional  endoscopic sinus surgery once in the past in 1999 when she had bilateral  ethmoid and maxillary sinus surgery at that time.   She had done very well  until just recently.   MEDICATIONS:  1. Lipitor 10.  2. Neurontin 300.  3. Toprol 25.  4. Allegra p.r.n.  5. Synthroid 0.5.  6. Advair Diskus.  7. Protonix 40 mg daily.  8. Iron.  9. Prednisone 5.  10.      Augmentin XL 1000 mg q.12 and _________ p.r.n.Marland Kitchen   ALLERGIES:  1. SULFA.  2. SELDANE.  3. BIAXIN.  4. CODEINE.  5. AMITRIPTYLINE.  6. TEGRETOL.   SOCIAL HISTORY:  She does not smoke or drink.   SURGICAL HISTORY:  She has had bilateral mastectomies for malignancy.  Breast implants were removed in 1999 and then replaced.  She had a  hysterectomy and of course the sinus surgery above mentioned.   She has also had some polymyalgia with reactive arthritis.  She has had  recent palpitations.  She  is using the Advair and is taken care of by Dr.  Shelle Iron.  She has had a flu shot.  One thing that brought sinus problems to  our attention, she had some vertigo thought to be associated with sinus  difficulties.  She fell and fractured her skull on December, 2004, and has  done very well, but she did notice on both films the sinusitis problem.  She  has had the reflux esophagitis cared for.   PHYSICAL EXAMINATION:  VITAL SIGNS:  Blood pressure 121/66, height 67  inches.  She weighs 158.  Respirations 18.  Heart rate is 71.  HEENT:  Ears are clear.  Tympanic membranes are clear.  Nose shows purulent  drainage on both sides in the posterior ethmoid region, possibly involving  the left maxillary more than the right, in the face of Augmentin 1000 b.i.d.  Nasopharynx is clear.  The oral cavity is also clear of any ulceration or  mass.  Larynx is clear of any ulceration or mass.  True cords, false cords,  epiglottis, and base of the tongue are clear.  True cord mobility, gag  reflex, tongue mobility, EOMs, and facial nerve are all symmetrical.  NECK:  Free of any thyromegaly, cervical adenopathy or mass.  CHEST:  Clear.  No rales, rhonchi or wheezes at this  time.  CARDIOVASCULAR:  No murmurs or gallops.  ABDOMEN:  Unremarkable.  EXTREMITIES:  Unremarkable except for the above-mentioned surgery.   DIAGNOSES:  1. Bilateral maxillary, ethmoid and sphenoid sinusitis.  2. History of chronic bronchitis and asthma.  3. History of bilateral mastectomies for pre carcinoma with breast implants,     then removed and replaced.  4. Hysterectomy.  5. History of the sinus surgery.  6. Polymyalgia.   PLAN:  Do bilateral ethmoid, maxillary sinusotomies with ethmoidectomies,  with bilateral sphenoidotomies.                                                Hermelinda Medicus, M.D.    JC/MEDQ  D:  03/24/2003  T:  03/25/2003  Job:  [id number]   cc:   Marcelyn Bruins, M.D. [facility]   Reather Littler, M.D.  [address]  [place]  [place] [address]  Fax: [phone]

## 2010-06-02 NOTE — Cardiovascular Report (Signed)
NAMEMarland [name]  BIRDELLA, SIPPEL                ACCOUNT NO.:  [id number]   MEDICAL RECORD NO.:  [id number]          PATIENT TYPE:  INP   LOCATION:  2007                         FACILITY:  [facility]   PHYSICIAN:  Cristy Hilts. Jacinto Halim, MD       DATE OF BIRTH:  Jan 18, 1928   DATE OF PROCEDURE:  05/14/2005  DATE OF DISCHARGE:                              CARDIAC CATHETERIZATION   PROCEDURES PERFORMED:  1.  Left ventriculography.  2.  Selective left and right coronary arteriography.  3.  Ascending aortogram.  4.  Abdominal aortogram.   INDICATIONS:  [name] is a 77 year old female who was admitted to [facility] with new onset of atrial fibrillation.  She also complained of  chest discomfort in the middle of her chest.  She has history of  hypertension, hyperlipidemia, and bronchial asthma.  She was admitted to the  hospital and two sets of cardiac enzymes were negative for myocardial injury  and EKG had demonstrated atrial fibrillation which converted to sinus rhythm  with beta blocker and calcium channel blocker therapy.  This morning while  she was in the bathroom suddenly had an episode of syncope and had  complained of severe crushing chest pain.  Prior to this she was complaining  of chest pain and she had diffuse sublingual nitroglycerin and she was doing  fairly well until she walked to the bathroom and had to strain and then she  passed out.  Because of severe chest pain right in the middle of her chest  associated with hypotensive rate which I presume was probably due to  nitroglycerin, however, underlying coronary disease could not be excluded.  Hence, she was brought emergently to the cardiac catheterization laboratory  to evaluate her coronary anatomy.   ASCENDING AORTOGRAM:  Abdominal aortogram was performed to evaluate for  ascending aortic aneurysm and aortic dissection as she had severe chest  pain.   HEMODYNAMIC DATA:  The left ventricular pressures were 123/0 with an end-  diastolic pressure of 10 mmHg.  The aortic pressures were 122/41 with a mean  of 72 mmHg.  There was no pressure gradient across the aortic valve.   ANGIOGRAPHIC DATA:  Left ventricle:  Left ventricular systolic function was  normal with ejection fraction estimated at 65%.  There was no significant  mitral regurgitation.   Right coronary artery:  Right coronary artery is a moderate to large caliber  vessel.  It gives origin to a large RV branch and a PDA.  It is codominant  with circumflex.  It is normal.   Left main:  Left main is a large caliber vessel.  It is normal.   Circumflex:  Circumflex is a very large caliber vessel and a super dominant  vessel.  It has mild calcification, but otherwise appears normal.  It gives  origin to large OM1 which traverses all the way to the apex, moderate sized  OM2, and has a large PDA.   LAD:  LAD is a large caliber vessel.  It bifurcates into a diagonal 1 and  LAD in the mid segment and  ends before reaching the apex.  This is smooth  and normal except for mild calcification.   ASCENDING AORTOGRAM:  Ascending aortogram revealed presence of three aortic  valve cusps.  There was no evidence of aortic regurgitation.  No evidence of  aortic dissection.   ABDOMINAL AORTOGRAM:  Abdomen aortogram revealed presence of two renal  arteries, one on either side.  There was no evidence of abdominal aortic  aneurysm.  The aortoiliac bifurcation was widely patent.   IMPRESSION:  1.  Except for mild calcification in the coronaries, normal coronary      arteries with no stenosis.  2.  Normal left ventricular systolic function, ejection fraction 65%.  3.  No evidence of ascending aortic aneurysm or abdominal aneurysm or      dissection.   RECOMMENDATIONS:  1.  The syncope was probably related to vasovagal episode.  2.  Chest pain is probably non-cardiac and evaluation for non-cardiac cause      of chest pain, probably GERD is indicated.   Patient will be  continued with beta blocker therapy along with calcium  channel blocker therapy and continued in observation for 24 hours.  If she  maintains sinus rhythm which she has she can be discharged home safely.  Otherwise, if she has any more episodes of palpitation or recurrence of  atrial fibrillation she may need to be on chronic Coumadin therapy.   A total of 75 mL of contrast was utilized for diagnostic angiography.   TECHNIQUE OF PROCEDURE:  Under usual sterile precautions using a a 6-French  right femoral arterial access, 6-French multipurpose BD catheter was  advanced to the ascending aorta with 0.035 [place] J-wire.  The catheter was  gently advanced to the left ventricle.  Left ventricular pressures were  monitored.  Hand contrast injection of the the left ventricle was performed  both in LAO and RAO projection.  The catheter was flushed with saline,  pulled back into the ascending aorta and pressure gradient across the aorta  was monitored.  Right coronary artery was selectively engaged and  angiography was performed.  In a similar fashion left main coronary artery  was selectively engaged and angiography was performed.  Then the catheter  was pulled into the root of the aorta and the ascending aortogram was  performed.  Then the catheter was pulled back into abdominal aorta and  abdominal aortogram was performed.  Then the catheter was pulled out of the  body in the usual fashion.  Patient tolerated procedure.  No immediate  complication noted.      Cristy Hilts. Jacinto Halim, MD  Electronically Signed     JRG/MEDQ  D:  05/14/2005  T:  05/15/2005  Job:  [id number]   cc:   Reather Littler, M.D.  Fax: [phone]

## 2010-06-02 NOTE — Op Note (Signed)
NAME:  Joanna [name], Joanna [name]                          ACCOUNT NO.:  [id number]   MEDICAL RECORD NO.:  [id number]                   PATIENT TYPE:  OIB   LOCATION:  5703                                 FACILITY:  [facility]   PHYSICIAN:  Hermelinda Medicus, M.D.                DATE OF BIRTH:  Aug 02, 1928   DATE OF PROCEDURE:  03/24/2003  DATE OF DISCHARGE:                                 OPERATIVE REPORT   PREOPERATIVE DIAGNOSIS:  Persistent ethmoid, maxillary, and sphenoid  sinusitis status post functional endoscopic sinus surgery in 1999, bilateral  ethmoidectomy and bilateral maxillary sinus ostial enlargement.   POSTOPERATIVE DIAGNOSIS:  Persistent ethmoid, maxillary, and sphenoid  sinusitis status post functional endoscopic sinus surgery in 1999, bilateral  ethmoidectomy and bilateral maxillary sinus ostial enlargement.   OPERATION:  Functional endoscopic sinus surgery with revision ethmoidectomy  and maxillary sinus ostial enlargement with sphenoidotomies.   SURGEON:  Hermelinda Medicus, M.D.   ANESTHESIA:  Local 1% Xylocaine with epinephrine and topical cocaine with  MAC, Dr. Katrinka Blazing   PROCEDURE:  The patient was placed in the supine position.  Under local  anesthesia where we used 6 mL of 1% Xylocaine with epinephrine and 200 mg  topical cocaine, first, the left sphenoid sinuses were approached and both  sphenoid sinuses were opened and suctioned using the 0 degree scope and the  straight and upbiting Shann Medal.  The openings or the natural ostia  of the sphenoid sinuses were just opened and dilated so that they would  drain more effectively.  Attention was then carried to the ethmoid sinus on  the left where the middle turbinate was pushed aggressively medial and the  inferior turbinate was pushed lateral and some purulent drainage could  immediately be seen via the naked eye evaluation as well as the scope.  The  ethmoid sinus scar tissue was taken down and opened up and the scar  tissue  and polypoid debris and new polyps were removed from this ethmoid sinus.  It  was also checked up in the frontal region where this frontal ostium was made  sure it would drain adequately.  Once the ethmoid sinus was opened, the  maxillary sinus, which was totally obstructed with polyps and thick  membrane, was suctioned of its pus which was cultured, then the back biting  forceps and the 0 degree scope and the angled Blakesley Wilders were used to  take down this uncinate process region and open this maxillary sinus  aggressively to approximately five times its normal size.  The sinus was  suctioned and antrostomy was also done for further ventilation and drainage.  On the right side, a similar situation was found where the natural ostium  was very stenotic with recurrent polyps and scar tissue and this was opened,  removed, and the ethmoid sinus was further opened more posteriorly using the  0 and 70 degree scope and straight and  upbiting [employer].  The last  right maxillary sinus was again suctioned and purulent drainage was found  and this was cultured, also.  Antrostomy was done on this side, also.  The  scope was used, the 0 and 70, to establish the natural ostium and there was  considerable polypoid material blocking her sinus drainage and was removed  and the sinus natural ostium was increased, again, approximately five times  its normal sized.  Once this was completed, then Gelfoam was placed within  the ethmoid  sinus, Gelfilm was placed to hold the middle turbinate medial, and Merocel  packs were placed in each nose which will be removed.  Follow up will be  tomorrow at 11:45 and then it will be in one week, three weeks, six weeks,  six months, and a year.                                               Hermelinda Medicus, M.D.    JC/MEDQ  D:  03/24/2003  T:  03/24/2003  Job:  [id number]   cc:   Marcelyn Bruins, M.D. [facility]   Reather Littler, M.D.  [address]  [place]  [place] [address]  Fax: [phone]

## 2010-06-02 NOTE — Discharge Summary (Signed)
NAME:  Joanna [name], Joanna [name]                            ACCOUNT NO.:  [id number]   MEDICAL RECORD NO.:  [id number]                   PATIENT TYPE:  ORB   LOCATION:                                       FACILITY:  [facility]   PHYSICIAN:  Reather Littler, M.D.                    DATE OF BIRTH:  [birth year]   DATE OF ADMISSION:  12/29/2002  DATE OF DISCHARGE:  01/08/2003                                 DISCHARGE SUMMARY   HISTORY:  This patient was transferred from the 3700 unit after an admission  for syncope at home related to tachycardia. The patient, however, continued  to have vertigo after treatment of her transient atrial fibrillation and  also because of her weakness. She was transferred to subacute unit.   HOSPITAL COURSE:  The patient was given intensive therapy. Her beta blockers  were reduced because of low normal blood pressure. She was given symptomatic  treatment for her vertigo. Has also complaints of headaches and  constipation. She was given BuSpar for her anxiety. The patient had slow  progress with ambulation.   The patient was also seen in consultation by physical therapy for vestibular  training. Subsequently on December 21, she had headache with nasal  congestion and stuffiness and was given Zithromax; however, the next day she  had a macular rash on knees and legs and also marked pains in shoulders,  knees, and foot along with some swelling of the left foot. Zithromax was  stopped, and prednisone and Mobic was given for symptomatic control. Dr.  Kellie Simmering was also consulted for this. He felt that she had reactive  arthralgia or arthritis and continued the prednisone with which she improved  considerably and was discharged on December 24 on the same medications.   LABORATORY DATA:  Rheumatoid factor was negative. Hemoglobin 10.2. ESR 132.  Sodium 131, glucose 106. Complement levels normal. ANA test 1:40,  ___________ positive. Neutrophil cytoplasmic antibody negative. Other  antibodies were also negative.   DISCHARGE DIAGNOSES:  1. Vertigo secondary to head injury.  2. Transient atrial fibrillation.  3. Sinusitis.  4. Reactive arthritis.   DISCHARGE CONDITION:  Improved.   DISCHARGE INSTRUCTIONS:  1. Amoxicillin 500 mg 3 times a day for 5 days.  2. Prednisone 15 mg for 2 days, 10 for 2 days, and 5 mg for 2 days.  3. Neurontin q.i.d.  4. Advair b.i.d.  5. Synthroid 75 mcg.  6. Fibercon.  7. Protonix.  8. Toprol-XL 25 mg q.d.  9. Advil p.r.n.   FOLLOW UP:  Follow up in the office in two weeks.                                                Reather Littler, M.D.  AK/MEDQ  D:  02/24/2003  T:  02/24/2003  Job:  [id number]

## 2010-06-02 NOTE — H&P (Signed)
NAME:  Joanna [name], Joanna [name] NO.:  [id number]   MEDICAL RECORD NO.:  [id number]          PATIENT TYPE:  EMS   LOCATION:  MAJO                         FACILITY:  [facility]   PHYSICIAN:  Ulyses Amor, MD DATE OF BIRTH:  11-Aug-1928   DATE OF ADMISSION:  05/13/2005  DATE OF DISCHARGE:                                HISTORY & PHYSICAL   REASON FOR ADMISSION:  Joanna [name] is a 77 year old white woman who is  admitted to [facility]  for the new onset of atrial fibrillation.   HISTORY OF PRESENT ILLNESS:  The patient, who has one past episode of atrial  fibrillation, experienced the onset of a rapid and irregular heart beat soon  after awakening this morning.  This was associated with a vague pressure in  her substernal area which radiated down to the left arm.  There was  associated dyspnea and diaphoresis but no nausea.  These symptoms continued  throughout the day in a low-grade fashion and ultimately prompted her call  to EMS.  She was found to be in atrial fibrillation with a rapid ventricular  rate.  Diltiazem IV was started in the field, and she was transported to the  emergency department.  Since her arrival in the emergency department, she  has remained in atrial fibrillation, though her heart rate has been less  than 100 since being on the diltiazem.  In addition, her chest discomfort  has resolved.   As noted, the patient has one past episode of atrial fibrillation.  The  details regarding this event are not apparent in the medical record.  There  is no history of chest pain, myocardial infarction, coronary artery disease,  congestive heart failure, or arrhythmia.  She has a number of risk factors  for coronary artery disease including hypertension, dyslipidemia, and a  family history of heart disease (father).  There is no history of diabetes  mellitus or smoking.   The patient experienced no dizziness, lightheadedness, syncope, or near-  syncope  today.   The patient has a number of medical problems including asthma,  diverticulitis, and hypothyroidism.   MEDICATIONS:  Albuterol, Advair Diskus, Fosamax, Protonix, Synthroid, and  Toprol-XL.   ALLERGIES:  SULFA.   PAST SURGICAL HISTORY:  1.  Bilateral mastectomies for precarcinoma with breast implants which were      removed and then replaced.  2.  Sinus surgery.  3.  Hysterectomy.   SOCIAL HISTORY:  The patient lives alone.  She is widowed.  She does not  work.  She does not drink alcohol.  She does not smoke cigarettes.   FAMILY HISTORY:  Notable for heart disease in her father.   REVIEW OF SYSTEMS:  No new problems related to his head, eyes, ears, nose,  mouth, throat, lungs, gastrointestinal system, genitourinary system, or  extremities.  There is no history of neurological or psychiatric disorder.  There is no history of fever, chills, or weight loss.   PHYSICAL EXAMINATION:  VITAL SIGNS:  Blood pressure 114/54, pulse 108 and  irregularly irregular.  Respirations 20, temperature 97.9.  GENERAL:  The patient was an elderly white woman in no discomfort.  He was  alert, oriented, appropriate, and responsive.  HEENT:  Normal.  NECK:  Without thyromegaly or adenopathy.  Carotid pulses were palpable  bilaterally without bruits.  CARDIAC:  Irregularly irregular rhythm.  There was no murmur, rub, gallop,  or click.  CHEST:  No chest wall tenderness was noted.  LUNGS:  Clear.  ABDOMEN:  Soft, nontender.  There was no mass, hepatosplenomegaly, bruit,  distention, rebound, guarding, or rigidity.  Bowel sounds were normal.  BREASTS/PELVIC/RECTAL:  Not performed as they were not pertinent to the  reason for acute care hospitalization.  EXTREMITIES:  Without edema, deviation, or deformity.  Radial and dorsalis  pedal pulses were palpable bilaterally.  NEUROLOGIC:  Brief screening  neurological survey was unremarkable.   LABORATORY DATA:  The EKG revealed atrial fibrillation  with a ventricular  rate of 98 beats per minute.  The chest radiographs, according to the  radiologist, demonstrated evidence of chronic obstructive pulmonary disease  with no acute cardiopulmonary abnormalities.  Potassium was 4.0, BUN 29,  creatinine 1.2.  White count was 11.5 with a hemoglobin of 12.7, hematocrit  37.  The initial set of cardiac markers revealed a myoglobin of 144, CK-MB  2.4, troponin less than 0.05.  The remaining studies were pending at the  time of this dictation.   IMPRESSION:  1.  New onset atrial fibrillation.  One prior episode in the past.  2.  Chest pain; rule out coronary artery disease.  3.  Hypertension.  4.  Dyslipidemia.  5.  Asthma.  6.  Hypothyroidism.   PLAN:  1.  Telemetry.  2.  Serial cardiac enzymes.  3.  Aspirin.  4.  Intravenous heparin.  5.  Intravenous diltiazem.  6.  Echocardiogram.  7.  Thyroid function tests.  8.  Further measures by Dr. Jacinto Halim.      Ulyses Amor, MD  Electronically Signed    MSC/MEDQ  D:  05/14/2005  T:  05/14/2005  Job:  [id number]   cc:   Cristy Hilts. Jacinto Halim, MD  Fax: [phone]

## 2010-06-02 NOTE — Consult Note (Signed)
NAME:  Joanna [name], Joanna [name]                ACCOUNT NO.:  [id number]   MEDICAL RECORD NO.:  [id number]          PATIENT TYPE:  INP   LOCATION:  6730                         FACILITY:  [facility]   PHYSICIAN:  Sandria Bales. Ezzard Standing, M.D.  DATE OF BIRTH:  January 05, 1929   DATE OF CONSULTATION:  07/10/2005  DATE OF DISCHARGE:                                   CONSULTATION   REASON FOR CONSULTATION:  Gallstones.   HISTORY OF PRESENT ILLNESS:  Ms. Moyd is a 77 year old white female who  was admitted to [facility] on 06 July 2005 with an acute febrile illness  which was felt to be a probable bronchitis.  She is somewhat difficult to  take a history from, and she tends to be somewhat vague in her answers.  Anyway, it sounds like on admission or at least on initial evaluation, she  had a CT scan which showed gallstones.  She was eating some food today and  had an acute bout with abdominal pain.  She bumped her liver enzymes with  alkaline phosphatase 193, SGOT 203, SGPT 170.  We were called to evaluate  her for gallbladder disease.   As best I can tell, she has had no prior history of peptic ulcer disease,  liver disease.  She has no prior knowledge of gallbladder disease or  pancreatic disease.  She says she has had some trouble with constipation.   Her prior abdominal surgeries have included a hysterectomy in the 1970s for  benign disease.   PAST MEDICAL HISTORY:  She is intolerant of CODEINE, AMITRIPTYLINE, and  PAXIL.   CURRENT MEDICATIONS ON ADMISSION:  1.  Advair.  2.  Singulair.  3.  Neurontin.  4.  Lorazepam.  5.  Protonix.  6.  Synthroid.  7.  Zocor.  8.  Toprol XL 75 mg daily.  9.  Actonel.  10. She takes a sleeping medicine.   REVIEW OF SYSTEMS:  NEUROLOGIC: She denies a history of seizure or loss of  consciousness.  PULMONARY: She has had an increasing cough.  She has asthma.  She had right  pleuritic pain on admission.  CARDIAC: She thinks she has had some trouble with  hypertension and atrial  fibrillation recently.  She also has history of hypercholesterolemia.  GASTROINTESTINAL: See History of Present Illness.  UROLOGIC: No history of kidney stones or kidney infections.   SOCIAL HISTORY:  She is a widow.  She has 2 sons; they live here in  [place].   PHYSICAL EXAMINATION:  VITAL SIGNS: Temperature 98.5, pulse 85, respirations  16, blood pressure 154/68.  GENERAL:  She is a well-nourished, pleasant, older white female.  She is a  little bit scattered in trying to get a history from her.  LUNGS: Clear to auscultation.  HEART: Regular rate and rhythm without murmur or rub.  ABDOMEN: Soft.  She has no right upper quadrant tenderness, no guarding, no  rebound.  She has a well-healed lower midline abdominal incision.  No  hernia, organomegaly.  RECTAL: I did not do a rectal exam on her.  EXTREMITIES:  She has use  of all four extremities.  NEUROLOGIC: Grossly intact.   LABORATORY DATA:  White blood count 6700, hemoglobin 11, hematocrit 33.  Sodium 134, potassium 3.1, chloride 100.  Her alkaline phosphatase is 193,  bilirubin 0.7,  SGOT 203, SGPT 170, albumin 2.9.   She had a CT scan on June 25 which shows a right hilar lymphadenopathy and  mediastinal lymph node, septal thickening, peribronchial vascular nodules of  the right lung.  This could represent atypical infection. She had bilateral  pleural effusions.  She had cholelithiasis with some gallbladder wall edema.   An ultrasound done today shows a gallbladder stones with thickened  gallbladder wall, localized pain over the gallbladder.  I reviewed the films  with Dr. Uvaldo Rising.   IMPRESSION:  1.  Gallstones with thickened gallbladder wall, evidence of chronic      cholecystitis.  I talked with the patient.  I think she is better served      by getting over her acute pulmonary illness and then having elective      cholecystectomy in 3-6 weeks after resolution of her current illness.  I       gave her a card to get in touch with our office for further evaluation.      If she has worsening gall bladder symptoms, surgery may need to be      pushed to an earlier date.  2.  Right pleuritic pain with bronchitis on antibiotics.  3.  Back pain being followed by orthopedics, Dr. Turner Daniels.  She is getting      physical therapy for that.  4.  Thyroid replacement.  5.  Hypertension.  6.  History of atrial fibrillation.      Sandria Bales. Ezzard Standing, M.D.  Electronically Signed     DHN/MEDQ  D:  07/10/2005  T:  07/10/2005  Job:  [id number]   cc:   Reather Littler, M.D.  Fax: [phone]   Rockey Situ. Flavia Shipper., M.D.  Fax: [phone]   Aundra Dubin, M.D.  [address]  [place]  [place] [address]   Feliberto Gottron. Turner Daniels, M.D.  Fax: [phone]

## 2010-06-02 NOTE — H&P (Signed)
NAME:  Joanna [name], ROUND NO.:  [id number]   MEDICAL RECORD NO.:  [id number]           PATIENT TYPE:   LOCATION:                                 FACILITY:   PHYSICIAN:  Reather Littler, M.D.            DATE OF BIRTH:   DATE OF ADMISSION:  07/06/2005  DATE OF DISCHARGE:                                HISTORY & PHYSICAL   HISTORY:  This 77 year old Caucasian female was __________ to the emergency  room for increasing fever, chest pain, cough, and malaise.  Patient says for  the last five to six days she was having pains in the right side of the  chest which were inconsistent in location.  They were migrating from the  front to the back and more recently she was having pain in the scapular  area.  The patient says the pain was worse with breathing and she would have  a difficult time breathing.  She did not have any unusual shortness of  breath.  She was having some cough and expectoration which was initially  yellowish and green and in the last day or so been less prominent.  The  patient was having increasing fever on the day of admission and patient had  already been started on Zithromax on the 20th of this month in the office.  Patient reported to the emergency room because of worsening overall symptoms  and decided to seek more help.   The patient already has known history of mild asthma followed by the  pulmonologist.  The patient says that her wheezing is not any worse  recently.   CURRENT MEDICATIONS:  1.  She is taking Advair b.i.d.  2.  Singulair.  3.  Neurontin 300 mg t.i.d.  4.  Lorazepam.  5.  Protonix.  6.  Synthroid which was on hold recently.  7.  Zocor.  8.  Toprol XL 75 mg.  9.  Actonel weekly.  10. p.r.n. sleep medications, most recently oxazepam.   ALLERGIES:  She is intolerant to CODEINE, AMITRIPTYLINE, and PAXIL.   PAST HISTORY:  She has had atrial fibrillation, inflammatory arthritis,  vertigo, partial hysterectomy, breast implants after  mastectomy.   FAMILY HISTORY:  Positive for diabetes and thyroid disease.   REVIEW OF SYSTEMS:  Patient has had mild hypothyroidism, although her  supplementation had been stopped because of recent atrial fibrillation.  She  has had mild increase in blood pressure.  She has a hypercholesterolemia.  There is no history of diabetes.  She does have some peripheral neuropathy  of uncertain etiology.  She also has chronic insomnia, anxiety, osteopenia,  occasional constipation.  Recently, also she has had more constipation along  with decreased appetite.   PHYSICAL EXAMINATION:  VITAL SIGNS:  The patient's blood pressure is 125/70,  her pulse is 80 per minute, regular, temperature is normal, oxygen  saturation was normal.  HEENT:  Her eyes look generally normal.  ENT examination shows dry mucous  membranes.  NECK:  No lymphadenopathy or thyroid enlargement.  HEART:  Sounds were normal.  LUNGS:  There is no change in percussion.  Her breath sounds are normal.  No  wheezing.  She did have a short pleural rub heard inconsistently on the left  posterior chest area in the supine position; however, this was not heard  subsequently.  ABDOMEN:  Not distended.  There is no mass or tenderness.  EXTREMITIES:  Normal.   Chest x-ray reported as having left lower lobe atelectasis.  No pneumonia.  White blood count 4400.  Sodium 128.  Rest of her laboratories were normal.   ASSESSMENT:  Patient has bronchitis.  She appears to have pleuritic chest  pain which may be due to viral pleurisy.   PLAN:  To hydrate her, control pain, and continue the Rocephin that was  started in the emergency room along with Zithromax that she already has been  taking.  She will be given symptomatic relief in the hospital until she is  comfortable and temperature is controlled.  Blood cultures and sputum  cultures will be done.           ______________________________  Reather Littler, M.D.     AK/MEDQ  D:  07/06/2005   T:  07/06/2005  Job:  [id number]

## 2010-06-02 NOTE — Discharge Summary (Signed)
NAMEMarland [name]  CONSANDRA, Joanna [name]                            ACCOUNT NO.:  [id number]   MEDICAL RECORD NO.:  [id number]                   PATIENT TYPE:  INP   LOCATION:  3714                                 FACILITY:  [facility]   PHYSICIAN:  Reather Littler, M.D.                    DATE OF BIRTH:  22-Aug-1928   DATE OF ADMISSION:  12/26/2002  DATE OF DISCHARGE:  12/29/2002                                 DISCHARGE SUMMARY   HISTORY OF PRESENT ILLNESS:  The patient was admitted with an episode of  syncope preceded by palpitations on the day of admission.  Her medications  on admission were Synthroid, Neurontin, and she also has a history of  hypercholesterolemia, mild hypertension, asthma, and peripheral neuropathy  and mild hypothyroidism.  She was also on Avapro 75 mg for hypertension.   PHYSICAL EXAMINATION:  As per HPI, no significant physical findings except  for irregular heart rate.   HOSPITAL COURSE:  The patient was admitted to monitored bed.  She was  initially given a Cardizem drip with which she converted to a normal sinus  rhythm and maintained this with low dose Lopressor.   Because of her head trauma, she was seen in neurosurgical consultation by  Dr. Venetia Maxon who felt that she had occipital skull fracture without clinical  significance and earlier effects of the trauma were dizziness and vertigo  which continued while the patient was in the hospital.   The patient remained weak.  She had difficulty walking because of the  vertigo and headaches and was discharged to subacute unit for recovery.   DISCHARGE DIAGNOSES:  1. Atrial fibrillation, new onset with syncope.  2. Occipital fracture and minimal hemorrhagic contusions of both frontal     lobes.  3. History of hypertension.  4. History of hypercholesterolemia.  5. History of hypothyroidism.   CONDITION ON DISCHARGE:  Improved.   RECOMMENDATIONS:  She will be placed on rehab program.   DISCHARGE MEDICATIONS:  1. Neurontin 300 t.i.d.  2. Lopressor 25 b.i.d.  3. Percocet p.r.n.  4. Meclozine t.i.d. p.r.n.  5. Celebrex 200 mg daily p.r.n.  6. Advair inhaler b.i.d.  7. Synthroid to be put on hold.                                                Reather Littler, M.D.    AK/MEDQ  D:  12/29/2002  T:  12/29/2002  Job:  [id number]

## 2010-06-02 NOTE — Discharge Summary (Signed)
NAME:  Joanna [name], Joanna [name] NO.:  [id number]   MEDICAL RECORD NO.:  [id number]          PATIENT TYPE:  INP   LOCATION:  2007                         FACILITY:  [facility]   PHYSICIAN:  Dani Gobble, MD       DATE OF BIRTH:  26-Jul-1928   DATE OF ADMISSION:  05/13/2005  DATE OF DISCHARGE:  05/16/2005                                 DISCHARGE SUMMARY   Ms. Staggs is a 77 year old female patient who was admitted secondary to  chest pain and atrial fibrillation with rapid response.  She was seen by Dr.  Waldon Reining in the emergency room.  She was put on IV Cardizem.  The following  day, she was seen by our service.  She was back in sinus rhythm as of about  7 o'clock that morning.  She was also seen by her primary care doctor, Dr.  Lucianne Muss.  She does have a history of hypothyroidism.  Apparently, her TSH  showed that it was low.  Thus, her Synthroid was put on hold.  She  apparently had a onset of atrial fibrillation approximately six years  earlier.  Because of her chest pain, it had been decided that she might need  to undergo cardiac catheterization and questionable Coumadin therapy.  She  was on IV heparin; however, on May 14, 2005, she had severe chest pain.  She was given sublingual nitroglycerin.  She then had to go to the bathroom  to have a bowel movement.  In the bathroom, she apparently had a syncopal  spell.  Her blood pressure dropped low.  The code team was called.  She was  given fluids and dopamine was started.  She was seen by Dr. Jacinto Halim.  It was  decided to take her emergently to cardiac catheterization.  She was still  having ongoing chest pain.   Her EKG really did not show any significant changes.  Her cardiac  catheterization showed normal coronary arteries.  She had some mild  calcification.   The following day, her case was discussed between Dr. Jacinto Halim, Dr. Tresa Endo and  Dr. Lucianne Muss.  It was decided to hold off on Coumadin now secondary to her  hypothyroidism.   Her beta blocker would be increased.  Her Cardizem was  discontinued.  Her heparin had been stopped postcatheterization.  She was to  get up in the hall and walk and she would be discharged the following day.  It was decided she should undergo 2-D echocardiogram.   On May 16, 2005, she was seen by Dr. Dani Gobble.  Blood pressure was 98/56,  pulse was 75, respirations 20, temperature was 98.5.  She was complaining  about cough and congestion which was a productive cough.  Dr. Domingo Sep  wanted her to be on a Z-Pak and thought she was stable to be discharged  home.  She was also seen by Dr. Lucianne Muss who also suggested using Mucinex and  to follow up with him in one month.   LABORATORY DATA:  Hemoglobin A1c was 6.4, total cholesterol 229,  triglycerides 84, HDL was 74, LDL was 138.  Her sodium was 139.  Original  potassium was 3.2 which was replaced.  Glucose was 113, BUN 17, creatinine  8.0.  On May 15, 2005, potassium was 3.7.  Postprocedure, her BUN and  creatinine remained normal.  Chest x-ray showed COPD, no active disease.   DISCHARGE MEDICATIONS:  1.  Neurontin 300 mg three times per day.  2.  Aspirin 81 mg one time per day.  3.  Lipitor 20 mg one time per day.  4.  Protonix 40 mg one time per day.  5.  Toprol XL 75 mg one time per day.  6.  Zithromax 500 mg one time per day for three days.  7.  Mucinex 600 mg two pills two times per day for a week.  8.  Albuterol/Advair Diskus as used before.  9.  She was told not to use Fosamax for now until seen by Dr. Lucianne Muss.   She should follow up with Dr. Jacinto Halim on May 31, 2005 at 10 a.m.   DISCHARGE DIAGNOSES:  1.  Chest pain, possible musculoskeletal versus gastrointestinal related.  2.  Recurrent paroxysm atrial fibrillation with rapid ventricular response:      Previous episode was six years ago.  It is in light of hyperthyroidism      on replacement with underlying hypothyroidism.  3.  Hypertension.  4.  Dyslipidemia.  5.  Hyperglycemia  with hemoglobin A1c of 6.4.  6.  History of polymyalgia:  She has just come off prednisone.  7.  Peripheral neuropathy.  8.  Syncope after getting nitroglycerin in the hospital with a bowel      movement caused by vasovagal response.  9.  Congestion and productive cough.      Lezlie Octave, N.P.    ______________________________  Dani Gobble, MD    BB/MEDQ  D:  05/16/2005  T:  05/17/2005  Job:  [id number]   cc:   Reather Littler, M.D.  Fax: [phone]

## 2010-06-02 NOTE — H&P (Signed)
Joanna [name], Joanna [name]                            ACCOUNT NO.:  [id number]   MEDICAL RECORD NO.:  [id number]                   PATIENT TYPE:  INP   LOCATION:  2924                                 FACILITY:  [facility]   PHYSICIAN:  Alfonse Alpers. Dagoberto Ligas, M.D.             DATE OF BIRTH:  1928/02/25   DATE OF ADMISSION:  12/26/2002  DATE OF DISCHARGE:                                HISTORY & PHYSICAL   CHIEF COMPLAINT:  This is a 77 year old woman who was brought to the  emergency room with an episode of weakness. The patient apparently called  the EMS herself and was seen at the door of her house by EMS. At that time,  she was kneeling on the floor, and they noticed a contusion on her occiput.  She was brought to the emergency room. She does not remember a history of  having fallen or a syncope episode in the past. When she was seen in the  EMS, she was found to be atrial fibrillation, brought to the emergency room  with a fast ventricular rate of 157. Currently, she is getting Cardizem  intravenously. The patient is unclear on her history at this point. She does  not remember hitting her head and does note some vertigo with movement of  her head. She complains of a headache, but this is probably more localized  to the actual contusion on her skull.   She has a history of thyroid disease. She is currently taking Synthroid at  0.05 mg daily.   She also has a history of intermittent asthmatic problems. She apparently  has been taking medication, Avelox, for this and most recently was on  steroids. This apparently was discontinued recently.   She has a history of dyslipidemia and is on a medication for this, but it is  not known what it is at this time.   PAST MEDICAL HISTORY:  She has had a hysterectomy in the past. She has also  had breast implants.   MEDICATIONS PRIOR TO ADMISSION:  1. Synthroid 0.05 mg q.d.  2. BuSpar periodically.  3. Neurontin periodically.  4. She has apparently used  Serevent and Pulmicort in the past.   FAMILY HISTORY:  The aunt has diabetes. Mother and aunts have thyroid  disease.   REVIEW OF SYSTEMS:  Not clear at this time.   PHYSICAL EXAMINATION:  GENERAL:  This is a well-developed woman who appears  in no clinical distress at this time. She appears comfortable, not dyspneic.  Her heart rate is 130.  HEENT:  Her head reveals an area of swelling in the occiput. It is not  hemorrhagic at this time. Eyes equally respond to light. Extraocular muscle  movements are intact.  NECK:  Her neck is supple. No bruits are noted.  LUNGS:  Her lungs are clear.  CARDIOVASCULAR:  Rhythm is irregular. The rate is approximately 130.  BREASTS:  Previous  implants to be present.  ABDOMEN:  Soft, no masses are present.  EXTREMITIES:  Reveal no calf tenderness to be present. No edema is present.  NEUROMUSCULAR:  The patient is somewhat unclear in her history, but she  moves all four extremities. No evidence of neurological lesion.   IMPRESSION:  1. Episode of atrial fibrillation with fast ventricular rate.  2. Syncopal episode, possibly related to #1.  3. History of dyslipidemia.  4. History of asthma.                                                Alfonse Alpers. Dagoberto Ligas, M.D.    CGG/MEDQ  D:  12/26/2002  T:  12/26/2002  Job:  [id number]

## 2010-06-02 NOTE — Discharge Summary (Signed)
NAME:  Joanna [name], MAIDEN NO.:  [id number]   MEDICAL RECORD NO.:  [id number]          PATIENT TYPE:  INP   LOCATION:  6730                         FACILITY:  [facility]   PHYSICIAN:  Reather Littler, M.D.       DATE OF BIRTH:  [birth year]   DATE OF ADMISSION:  07/05/2005  DATE OF DISCHARGE:  07/16/2005                                 DISCHARGE SUMMARY   HISTORY:  This lady was admitted from the emergency room for fever, chest  pain, cough, malaise and it was felt that she was not responding to  outpatient treatment with Zithromax.  The patient has some history of  asthma.   MEDICATIONS:  Advair, Singulair and Neurontin, lorazepam, Protonix, Zocor,  Toprol and Actonel.   For past history, family history, review of systems see HPI.   PHYSICAL EXAMINATION:  The patient's pulse was 80.  Other vital signs  normal.  She had dry mucous membranes.  Her pulmonary exam showed a short  pleural rub inconsistently on the left posterior chest area.  The rest of  the exam was unremarkable.   HOSPITAL COURSE:  The patient was treated with IV Rocephin along with oral  Zithromax.  Blood cultures were drawn but did not show any infection.  Also  her hydration was with saline because of a sodium of 128   She did have persistently recurrent fever for which she was seen by Dr.  Roxan Hockey from infectious disease.  She did have one blood culture that was  positive for gram positive cocci in chains.  CT scan of the chest was  ordered.  She was initially started on vancomycin but this was changed back  to Rocephin.  She was also seen in consultation by pulmonology for her  lymphadenopathy in the chest which was felt to be reactive since it was  previously also found.   There was also some suggestion of mild CHF and she was given Lasix as her  BMP was increased.   In the meantime, the patient also had some right upper quadrant pain and  vomiting and she did have cholelithiasis, however, HIDA scan  was negative  and the surgeons did not feel she needed surgery.   The patient had slow improvement in her fever and gradually started feeling  stronger and improving her appetite.   She did have abnormal liver functions which gradually improved.  Abdominal  pain resolved spontaneously and the patient was discharge to be followed up  as an outpatient.   SIGNIFICANT LABS:  Hemoglobin of 10.9 July 12, 2005.  Sodium ranged from 128  up to 135.  The patient also had transient low potassium of 3.1.  Glucose  ranged from 95 to 123, albumin 2.4. On the final test, AST ranged from 40 up  to 203, the ALT ranged from 25 up to 170 with a final number 105, alkaline  phosphatase ranged 56 up to 200.  BNP 1122 and repeat 1411 on July 16, 2005.  TSH ranged from 5.52 initially and repeat 1.9.  Cortisone normal.  Serum IgE  normal.  Rheumatoid factor negative.  Blood culture one positive for  viridans Streptococcus.  ANA negative.   FINAL DIAGNOSIS:  1.  Acute bronchitis with possible viridans Streptococcus bacteremia.  2.  Mild congestive heart failure.  3.  Possible cholecystitis with abnormal liver functions.   DISCHARGE CONDITION:  Improved.   DISCHARGE INSTRUCTIONS:  The patient was sent home on her admission  medications and the patient was not sent on any antibiotics but she was  continued on 20 mg Lasix a day.  Diet to be regular.  Activity as tolerated.   ADDENDUM:  The patient's echocardiogram showed ejection fraction was 50%  which is lower limits of normal.  Left ventricular wall thickness was mildly  to moderately increased, no vegetation identified and the valves were not  well visualized.  There was trivial mitral regurgitation.           ______________________________  Reather Littler, M.D.     AK/MEDQ  D:  08/22/2005  T:  08/22/2005  Job:  [id number]   cc:   Marcelyn Bruins, M.D. [facility]  Rockey Situ. Flavia Shipper., M.D.  Aundra Dubin, M.D.

## 2010-06-02 NOTE — Consult Note (Signed)
[name], [name]                            ACCOUNT NO.:  [id number]   MEDICAL RECORD NO.:  [id number]                   PATIENT TYPE:  INP   LOCATION:  1830                                 FACILITY:  [facility]   PHYSICIAN:  Danae Orleans. Venetia Maxon, M.D.               DATE OF BIRTH:  [birth year]   DATE OF CONSULTATION:  12/26/2002  DATE OF DISCHARGE:                                   CONSULTATION   REASON FOR CONSULTATION:  Head injury.   HISTORY OF PRESENT ILLNESS:  The patient is a 77 year old woman in atrial  fibrillation who fell and struck her occiput.  She had a questionable  syncopal episode versus loss of consciousness after her fall.  She has an  occipital skull fracture on CT with contrast, injury with multiple small  petechial hemorrhages in bilateral frontal regions.  She was evaluated in  the emergency room by Alfonse Alpers. Gegick, M.D. and is being admitted to the  CCU for telemetry and treatment of her atrial fibrillation.   PAST MEDICAL HISTORY:  Significant for elevated cholesterol, peripheral  neuropathy, and asthma with interstitial fibrosis.   MEDICATIONS:  1. Inhalers.  2. Neurontin.   PHYSICAL EXAMINATION:  GENERAL:  The patient is awake, alert, and  conversant.  She appears to be somewhat uncomfortable.  She complains of  swimmy headed when she sits up.  VITAL SIGNS:  Blood pressure 148/79, pulse 145, respiratory rate 24, and  temperature of 98.8.  She states her name and age.  HEENT:  Pupils equal, round, and reactive to light.  Extraocular movements  are intact.  Facial sensation and facial motor intact and symmetric. Hearing  intact.  Palate is upgoing.  Shoulder shrug is symmetric.  Tongue protrudes  in the midline.  On motor examination, she has 5/5 upper and lower extremity  strength and all motor groups are symmetric with no apparent weakness and no  apparent drift of her outstretched upper extremities.  She has tenderness  over her occiput, no tenderness over  her cervical spine with motion of her  cervical spine. Reflexes are symmetric at 2 in the biceps, triceps, and  brachial radialis, 2 at the knees, and 2 at the ankles.  Toes are downgoing  to plantar stimulation.  She denies any numbness in her upper and lower  extremities.  Cerebellar examination is normal.   IMPRESSION:  The patient is a 77 year old woman with small bifrontal  intracerebral hemorrhages following a fall in which she struck the back of  her head and had an occipital skull fracture and small subdural hematoma.  She will be admitted for cardiology follow-up, but needs to be observed  closely with serial neuro checks and needs a head Ct repeat in the morning.  No anticoagulation.  I will follow the patient.  Danae Orleans. Venetia Maxon, M.D.    JDS/MEDQ  D:  12/26/2002  T:  12/26/2002  Job:  [id number]

## 2010-06-02 NOTE — Assessment & Plan Note (Signed)
 HEALTHCARE                         GASTROENTEROLOGY OFFICE NOTE   Joanna [name], Joanna [name]                       MRN:          [id number]  DATE:01/24/2006                            DOB:          [birth year]    Joanna [name] comes in and says she is having some pressure in her rectum and  having difficulty with moving bowel activity, increasing constipation,  hemorrhoids are bothering her.  No real bleeding, just irritation.  Said  the stool softener, Peri-Colace, not working well.  Says there has been  a great deal of stress since in the hospital several months ago and says  her son, who is an alcoholic, really upset her and he finally moved out.  Otherwise, she is doing fairly well.  She says she has difficulty with  straining with bowel activity.  She has a known very torturous colon and  had this difficulty for quite some time.  I talked to her at length  about using MiraLax and Senokot, prunes, Peri-Colace, and Dulcolax to  have her bowel activity more regularly and less stressful.   PHYSICAL EXAMINATION:  She weighs 143, blood pressure 150/64, pulse 70  and regular.  Neck and upper extremities are unremarkable.  RECTAL:  Done and entirely normal.  There was no stool.  I did not see  any external hemorrhoids.  There did not appear to be anything in the  rectal vault of any concern.  There was no stool to check for blood.   IMPRESSION:  1. Irritable bowel syndrome with constipation component secondary to      extremely torturous colon with some rectal irritation.  2. Hyperlipidemia.  3. Hypothyroidism.  4. Anxiety and depression.  5. Gastroesophageal reflux disease.  6. History of asthmatic bronchitis.  7. Hypertension.   RECOMMENDATIONS:  Try some Canasa suppositories 1 at bedtime, some  Analpram cream, and to continue on her Protonix.  I told her to use some  stool softeners.  We will give her some MiraLax.  Gave her samples of  all of these  medications and hopefully this will be helpful to her.  I  told her to call us if she had any further difficulty.     Ulyess Mort, MD  Electronically Signed    SML/MedQ  DD: 01/24/2006  DT: 01/24/2006  Job #: [id number]

## 2010-06-02 NOTE — Consult Note (Signed)
Joanna [name]                            ACCOUNT NO.:  [id number]   MEDICAL RECORD NO.:  [id number]                   PATIENT TYPE:  INP   LOCATION:  3714                                 FACILITY:  [facility]   PHYSICIAN:  Aundra Dubin, M.D.            DATE OF BIRTH:  10-Sep-1928   DATE OF CONSULTATION:  01/07/2003  DATE OF DISCHARGE:  12/29/2002                                   CONSULTATION   CHIEF COMPLAINT:  Swollen joints, increased ESR.   HISTORY OF PRESENT ILLNESS:  Joanna [name] is a 77 year old woman with a seven  year history of idiopathic peripheral neuropathy.  About two weeks ago at  home she had fallen and required hospitalization and further subacute care.  At that time at the fall, she was having a great deal of congestion and  sinus.  She says that she was blowing out through her nose bloody scabs,  long and green yellow mucus.  She feels that she was having fever of about  101 at home, but this was subjective.  She felt that she had been going  downhill, but was not sure if she had lost weight.  She was having anorexia.   About two days ago, she began having a great deal of pain overnight.  She  was having pain in the shoulders, left mid foot, and knees.  She developed  welts on the anterior shins at the knees along with the palms and soles of  her feet.  She has had no fever while being on the subacute care floor.   Labs on January 06, 2003, showed an ESR of 130, uric acid 4.4, WBC 8.4,  hemoglobin 10.2, platelets 427.   On further review of systems, she has some headaches, but they are no worse.  She feels that the symptoms of her pain and her peripheral neuropathy have  been unchanged.  She has a history of asthma.  She has had some dizziness.  She has had a foot x-ray which was unrevealing.   PAST MEDICAL HISTORY:  1. Hypothyroidism.  2. Cataract surgery.  3. Idiopathic peripheral neuropathy.  No history of gout.   MEDICATIONS:  1. Advair q. day.  2. Metoprolol 12.5 mg b.i.d.  3. Synthroid 25 mcg q. day.  4. Calcium b.i.d.  5. Neurontin 400 mg t.i.d.  6. Allegra 180 mg q. day.  7. Amoxicillin 500 mg t.i.d.  8. Mobic.  9. Prednisone 20 mg q. day.  10.      Oxycodone p.r.n.  11.      Oxazepam 15 mg h.s.  12.      Meclizine p.r.n.   FAMILY HISTORY:  Her father died at age 64, and he had stomach problems.  Her mother died at age 59, and she had heart problems.   SOCIAL HISTORY:  She has been widowed for eight years.  She generally lives  alone, but presently  a son will be at home to help with her.  She has two  children.  She had smoked a little bit throughout the years.  No alcohol.   PHYSICAL EXAMINATION:  VITAL SIGNS:  Temperature 98.1, pulse 89,  respirations 20, blood pressure 101/57.  GENERAL:  She is a thin woman who is in no distress.  SKIN:  She had some macular areas that are minimally palpable to the palms  of the hands.  These are red and a little tender.  There are also bumps to  the anterior shins, and a few of these types of areas on her feet.  HEENT:  PERRL/EOMI.  Mouth:  Dentures.  She has either food or possibly a  yeast infection to the back of her throat.  She has just eaten.  NECK:  Prominent submandibular glands, nontender thyroid.  LUNGS:  Clear.  HEART:  Regular, no murmur.  ABDOMEN:  Negative hepatosplenomegaly, nontender.  MUSCULOSKELETAL:  The hands have mild degenerative changes, but are cool and  nontender.  Wrists, elbows, and shoulders move through a full range of  motion, and this is not painful for her.  Compression of her spine finds  this is nontender.  Hips with good range of motion.  Knees are cool, and  flexes easily to 135 degrees, and are nontender.  The bilateral feet are  cool and not swollen, but she is quite tender to the left mid foot more  medially.  NEUROLOGIC:  She had good pinprick discrimination.  Strength is 5/5.   ASSESSMENT AND PLAN:  1. Acute onset of painful joints  with an increased erythrocyte sedimentation     rate.  She has a whole complex of issues and symptoms.  This is a woman     who has idiopathic neuropathy, difficulties with sinusitis, and some     anorexia.  She has had subjective fever at home.  Questionably, this     could be a vasculitis.  I do want to check an ANCA profile.  Also, she     was just on the medicine azithromycin before this began, possibly she had     a reaction to this.  Her joints are much improved at this time, and she     is on prednisone.  I favor treating her with about a two week tapering     dose.  This would allow time for the labs to return, and if she is     showing laboratory evidence of a vasculitis, then the prednisone would be     extended.  2. Recent fall.  3. Idiopathic neuropathy.   I will follow up as need be in my office with Joanna [name].                                               Aundra Dubin, M.D.    WWT/MEDQ  D:  01/07/2003  T:  01/08/2003  Job:  [id number]   cc:   Reather Littler, M.D.  [address]  [place]  [place] [address]  Fax: [phone]

## 2010-06-02 NOTE — Assessment & Plan Note (Signed)
[facility]                             PULMONARY OFFICE NOTE   Joanna, [name]                       MRN:          [id number]  DATE:03/11/2006                            DOB:          1928/09/18    SUBJECTIVE:  [name] is a 77 year old white female who is well known  to me with bronchiectasis, as well as a recurrent sign of bronchitis  syndrome, as well as underlying asthma, which has a significant allergic  component to it.  She has done much better since being on Xolair on a  continual basis.  She has continued to have trouble, primarily due to  her medical noncompliance with her inhaled combination bronchodilator  and corticosteroid.  She comes in today where her cough is at her usual  baseline with intermittent, purulent sputum.  Her breathing is actually  doing fairly well, according to her history, but she is not using her  Symbicort on a regular basis.  She is only using 1 puff 1 time a day.  She has continued to complain of a pleuritic-like chest pain in her back  along both scapulas.  She has had spine films in the past to rule out  vertebral body compression fracture with none being found.  She has not  had a VQ scan to rule out chronic thromboembolic disease.   PHYSICAL EXAMINATION:  BP is 138/60, pulse 56, temperature is 97.6,  weight is 141 pounds, O2 saturation on room air is 99%.  CHEST:  Chest reveals bibasilar crackles which are chronic.  There are  no wheezes or rhonchi.  CARDIAC EXAM:  Reveals regular rate and rhythm.   IMPRESSION:  1. Asthma/bronchiectasis/chronic sinusitis.  At this point in time she      has got it well controlled on her Xolair.  She does need to use her      Symbicort 2 puffs 2 times a day as we have discussed.  2. Posterior scapular pain which is pleuritic in nature.  There was no      evidence of vertebral body compression fractures on her last      thoracic spine films.  I suspect this is  musculoskeletal in nature,      however, I think the burden of proof is on [place] to make sure she does      not have chronic thromboembolic disease.  Her chest x-ray today in      the office shows no acute process or pneumothorax.   PLAN:  1. Will schedule for a ventilation perfusion lung scan and let patient      know the results.  2. I have urged the patient to take Symbicort 2 puffs 2 times a day.  3. Continue Xolair.  4. The patient will follow up in 3 months or sooner if there are      problems.     Barbaraann Share, MD,FCCP  Electronically Signed    [facility]/MedQ  DD: 03/12/2006  DT: 03/12/2006  Job #: [id number]   cc:   Reather Littler, M.D.

## 2010-06-02 NOTE — Assessment & Plan Note (Signed)
[facility]                             PULMONARY OFFICE NOTE   ARYIA, DELIRA                       MRN:          [id number]  DATE:11/26/2005                            DOB:          June 27, 1928    HISTORY OF PRESENT ILLNESS:  The patient is a 77 year old white female  patient of Dr. Teddy Spike who has a known history of asthmatic bronchitis  and rhinitis, presents for an acute office visit complaining of a 2 week  history of increased productive cough with thick yellow mucus, nasal  congestion and shortness of breath.  Patient has not been seen in almost  a year here in the office.  She has previously been on Advair, however,  reports she was changed approximately 6 months ago to a different  inhaler and is unsure who changed her and what inhaler she is on.  She  is also getting monthly Xolair injections.  The patient denies any  hemoptysis, orthopnea, PND or leg swelling.  Patient reports that she is  under an enormous amount of stress at home.  She has an alcoholic son  and has been having lots of difficulties.   PAST MEDICAL HISTORY:  Reviewed.   CURRENT MEDICATIONS:  Reviewed.   PHYSICAL EXAM:  Patient is a pleasant white female in no acute distress.  She is afebrile with stable vital signs.  Her O2 saturation is 97% on  room air.  HEENT:  Unremarkable.  NECK:  Supple without adenopathy.  No JVD.  LUNG SOUNDS:  Reveal diminished breath sounds in the bases, otherwise  clear.  CARDIAC:  Regular rate and rhythm.  ABDOMEN:  Soft, benign.  EXTREMITIES:  Warm without any edema.   IMPRESSION AND PLAN:  Acute tracheobronchitis.  The patient is to get  doxycycline times seven days.  Mucinex DM twice a day.  The patient will  return here in two weeks with Dr. Shelle Iron, or sooner if needed.  The  patient is advised to bring all of her medications at her next visit.      Rubye Oaks, NP  Electronically Signed      Barbaraann Share,  MD,FCCP  Electronically Signed   TP/MedQ  DD: 11/26/2005  DT: 11/26/2005  Job #: [id number]

## 2010-06-06 ENCOUNTER — Telehealth: Payer: Self-pay | Admitting: Pulmonary Disease

## 2010-06-06 NOTE — Telephone Encounter (Signed)
xolair is either given once a week or every 2 weeks.  Check her schedule.

## 2010-06-06 NOTE — Telephone Encounter (Signed)
Spoke with pt. She states that she is confused about when to get her next xolair and wants KC to verify this. Last ing was given 04/20/10. Pls advise thanks!

## 2010-06-06 NOTE — Telephone Encounter (Signed)
Looks like last Geoffry Paradise was given 04/20/10- atc pt and line busy x 2 wcb

## 2010-06-07 NOTE — Telephone Encounter (Signed)
Spoke with Dimas Millin in Allergy Lab.  Per Tammy, pt is supposed to be xolair injections once monthly.  However, she got one in January, March, then April.  Last injection was 04/20/10.    LMOMTCB to inform pt injections are once monthly.  Will also forward message to [facility] so he is aware pt is not getting xolair injections on a monthly basis.

## 2010-06-07 NOTE — Telephone Encounter (Signed)
Just make sure that she knows she is supposed to get one this month.

## 2010-06-08 ENCOUNTER — Ambulatory Visit (INDEPENDENT_AMBULATORY_CARE_PROVIDER_SITE_OTHER): Payer: Medicare Other

## 2010-06-08 DIAGNOSIS — J45909 Unspecified asthma, uncomplicated: Secondary | ICD-10-CM

## 2010-06-08 NOTE — Telephone Encounter (Signed)
Pt aware she needs to get her Xolair monthly and will make the appt. She was transferred to the schedulers.

## 2010-06-08 NOTE — Telephone Encounter (Signed)
[email or web address].[name], CMA  

## 2010-06-09 MED ORDER — OMALIZUMAB 150 MG ~~LOC~~ SOLR
150.0000 mg | Freq: Once | SUBCUTANEOUS | Status: AC
  Administered 2010-06-09: 150 mg via SUBCUTANEOUS

## 2010-06-29 ENCOUNTER — Telehealth: Payer: Self-pay | Admitting: Pulmonary Disease

## 2010-06-29 MED ORDER — MOXIFLOXACIN HCL 400 MG PO TABS: 400.0000 mg | ORAL_TABLET | Freq: Every day | ORAL | Status: DC

## 2010-06-29 NOTE — Telephone Encounter (Signed)
Called, spoke with pt.  She is aware will call in avelox 400mg  qd x 5 days and will need OV with CXR.  OV scheduled with RA for tomorrow, June 15 at 4:15--pt aware.

## 2010-06-29 NOTE — Telephone Encounter (Signed)
avelox 400 qd  X 5 days OV with TP or other doc for CXR

## 2010-06-29 NOTE — Telephone Encounter (Signed)
Called and spoke with pt.  Pt states symptoms started 3 days.  C/o raw and sore throat, coughing up thick green sputum but states it's difficult to get up, R sided chest "pressure", increased sob, and chills and sweats.  KC out of office this week.  Will forward message to doc of the day to address.  Allergies  Allergen Reactions  . Caffeine   . Cefuroxime Axetil     REACTION: hives  . Clarithromycin   . Codeine   . Latex   . Prednisone   . Sulfonamide Derivatives

## 2010-06-30 ENCOUNTER — Emergency Department (HOSPITAL_COMMUNITY): Payer: Medicare Other

## 2010-06-30 ENCOUNTER — Ambulatory Visit: Payer: Medicare Other | Admitting: Pulmonary Disease

## 2010-06-30 ENCOUNTER — Emergency Department (HOSPITAL_COMMUNITY)
Admission: EM | Admit: 2010-06-30 | Discharge: 2010-06-30 | Disposition: A | Discharge status: Home / Self Care | Payer: Medicare Other | Attending: Emergency Medicine | Admitting: Emergency Medicine

## 2010-06-30 DIAGNOSIS — E860 Dehydration: Secondary | ICD-10-CM | POA: Insufficient documentation

## 2010-06-30 DIAGNOSIS — J449 Chronic obstructive pulmonary disease, unspecified: Secondary | ICD-10-CM | POA: Insufficient documentation

## 2010-06-30 DIAGNOSIS — J4489 Other specified chronic obstructive pulmonary disease: Secondary | ICD-10-CM | POA: Insufficient documentation

## 2010-06-30 DIAGNOSIS — J40 Bronchitis, not specified as acute or chronic: Secondary | ICD-10-CM | POA: Insufficient documentation

## 2010-06-30 DIAGNOSIS — E039 Hypothyroidism, unspecified: Secondary | ICD-10-CM | POA: Insufficient documentation

## 2010-06-30 DIAGNOSIS — I10 Essential (primary) hypertension: Secondary | ICD-10-CM | POA: Insufficient documentation

## 2010-06-30 DIAGNOSIS — R509 Fever, unspecified: Secondary | ICD-10-CM | POA: Insufficient documentation

## 2010-06-30 DIAGNOSIS — E7800 Pure hypercholesterolemia, unspecified: Secondary | ICD-10-CM | POA: Insufficient documentation

## 2010-06-30 LAB — DIFFERENTIAL
Basophils Absolute: 0 10*3/uL (ref 0.0–0.1)
Eosinophils Relative: 0 % (ref 0–5)
Lymphocytes Relative: 6 % — ABNORMAL LOW (ref 12–46)
Lymphs Abs: 0.6 10*3/uL — ABNORMAL LOW (ref 0.7–4.0)
Monocytes Absolute: 0.5 10*3/uL (ref 0.1–1.0)
Neutro Abs: 9 10*3/uL — ABNORMAL HIGH (ref 1.7–7.7)

## 2010-06-30 LAB — CBC
HCT: 36.1 % (ref 36.0–46.0)
Hemoglobin: 12.2 g/dL (ref 12.0–15.0)
MCHC: 33.8 g/dL (ref 30.0–36.0)
MCV: 84.5 fL (ref 78.0–100.0)
RDW: 12.5 % (ref 11.5–15.5)

## 2010-06-30 LAB — BASIC METABOLIC PANEL
BUN: 13 mg/dL (ref 6–23)
Chloride: 95 mEq/L — ABNORMAL LOW (ref 96–112)
Creatinine, Ser: 0.94 mg/dL (ref 0.50–1.10)
Glucose, Bld: 123 mg/dL — ABNORMAL HIGH (ref 70–99)
Potassium: 4.2 mEq/L (ref 3.5–5.1)

## 2010-07-04 ENCOUNTER — Ambulatory Visit (INDEPENDENT_AMBULATORY_CARE_PROVIDER_SITE_OTHER): Payer: Medicare Other

## 2010-07-04 DIAGNOSIS — J45909 Unspecified asthma, uncomplicated: Secondary | ICD-10-CM

## 2010-07-05 ENCOUNTER — Encounter: Payer: Self-pay | Admitting: Pulmonary Disease

## 2010-07-05 ENCOUNTER — Ambulatory Visit (INDEPENDENT_AMBULATORY_CARE_PROVIDER_SITE_OTHER): Payer: Medicare Other | Admitting: Pulmonary Disease

## 2010-07-05 VITALS — BP 104/62 | HR 73 | Temp 97.6°F | Ht 67.5 in | Wt 147.6 lb

## 2010-07-05 DIAGNOSIS — J069 Acute upper respiratory infection, unspecified: Secondary | ICD-10-CM

## 2010-07-05 MED ORDER — OMALIZUMAB 150 MG ~~LOC~~ SOLR
150.0000 mg | Freq: Once | SUBCUTANEOUS | Status: AC
  Administered 2010-07-05: 150 mg via SUBCUTANEOUS

## 2010-07-05 NOTE — Progress Notes (Signed)
  Subjective:    Patient ID: Joanna [name], female    DOB: 1928/07/12, 77 y.o.   MRN: [id number]  HPI 2 wks of increased resp symptoms - SOB, cough, increased mucus, nasal congestion. Finally seen in ED 6/15. Prescribed a cough suppressant. Instructed to f/u here today. Presently states she is "a lot better". No new complaints. Denies CP, fever, hemoptysis, LE edema and calf tenderness   Review of Systems  Constitutional: Negative for fever, chills and diaphoresis.  HENT: Positive for rhinorrhea and postnasal drip.   Eyes: Negative.   Respiratory: Negative for chest tightness and wheezing.   Cardiovascular: Negative.   Gastrointestinal: Negative.   Musculoskeletal: Negative.   Neurological: Negative.        Objective:   Physical Exam  Constitutional: She is oriented to person, place, and time. She appears well-developed and well-nourished. No distress.  HENT:  Head: Normocephalic and atraumatic.  Right Ear: External ear normal.  Left Ear: External ear normal.  Mouth/Throat: Oropharynx is clear and moist.  Eyes: EOM are normal. Pupils are equal, round, and reactive to light.  Neck: Normal range of motion. Neck supple. No thyromegaly present.  Cardiovascular: Normal rate, regular rhythm and normal heart sounds.   No murmur heard. Pulmonary/Chest: Effort normal and breath sounds normal. She has no wheezes. She has no rales.  Abdominal: Soft. Bowel sounds are normal.  Genitourinary: Vagina normal and uterus normal.  Musculoskeletal: Normal range of motion.  Lymphadenopathy:    She has no cervical adenopathy.  Neurological: She is alert and oriented to person, place, and time.  Skin: Skin is dry. She is not diaphoretic.  Psychiatric: She has a normal mood and affect.          Assessment & Plan:  Resolving URI - treated conservatively by ED MD 6/16.  No further specific Rx.  F/U with Dr Shelle Iron as previously scheduled

## 2010-07-12 ENCOUNTER — Other Ambulatory Visit: Payer: Self-pay | Admitting: Neurology

## 2010-07-20 ENCOUNTER — Ambulatory Visit
Admission: RE | Admit: 2010-07-20 | Discharge: 2010-07-20 | Disposition: A | Discharge status: Home / Self Care | Payer: Medicare Other | Source: Ambulatory Visit | Attending: Neurology | Admitting: Neurology

## 2010-08-09 ENCOUNTER — Telehealth: Payer: Self-pay | Admitting: Pulmonary Disease

## 2010-08-09 NOTE — Telephone Encounter (Signed)
Called and spoke with pt.  Pt c/o chest congestion, hoarseness, sore/dry throat, coughing up white to "dark" colored sputum but couldn't tell me specifically what color- just kept saying "dark" Also c/o increased sob and no energy.  Symptoms started approx 2 weeks ago.  Pt scheduled to see TP tomorrow at 2:15pm (could not come in today- didn't have transportation)  But instructed her to go to ER in the meantime if she felt symptoms worsened and she needed immediate attention.  Pt verbalized understanding.

## 2010-08-10 ENCOUNTER — Ambulatory Visit (INDEPENDENT_AMBULATORY_CARE_PROVIDER_SITE_OTHER): Payer: Medicare Other | Admitting: Adult Health

## 2010-08-10 ENCOUNTER — Encounter: Payer: Self-pay | Admitting: Adult Health

## 2010-08-10 DIAGNOSIS — J479 Bronchiectasis, uncomplicated: Secondary | ICD-10-CM

## 2010-08-10 MED ORDER — AMOXICILLIN-POT CLAVULANATE 875-125 MG PO TABS: 1.0000 | ORAL_TABLET | Freq: Two times a day (BID) | ORAL | Status: AC

## 2010-08-10 NOTE — Assessment & Plan Note (Signed)
Flare with associated sinusitis   Plan:  Augmentin  Twice daily  For 10 days- take with food   Mucinex DM Twice daily  As needed  Cough/congestion  Saline nasal rinse As needed   Please contact office for sooner follow up if symptoms do not improve or worsen or seek emergency care

## 2010-08-10 NOTE — Patient Instructions (Addendum)
Augmentin  Twice daily  For 10 days- take with food   Mucinex DM Twice daily  As needed  Cough/congestion  Saline nasal rinse As needed   Please contact office for sooner follow up if symptoms do not improve or worsen or seek emergency care

## 2010-08-10 NOTE — Progress Notes (Signed)
  Subjective:    Patient ID: Joanna [name], female    DOB: 1928/05/25, 77 y.o.   MRN: [id number]  HPI 76 yo WF with known hx of COPD    08/10/2010  Pt presents for an acute office visit. Complains of hoarseness, chest congestion, occ prod cough with light yellow mucus x1week.  OTC not helping. Chest fills full of congestion. Has dry mouth with hoarseness. No energy. Worn out.   Has upcoming colonoscopy next week.   Review of Systems Constitutional:   No  weight loss, night sweats,   +Fevers, chills, fatigue, or  lassitude.  HEENT:   No headaches,  Difficulty swallowing,  Tooth/dental problems, or  Sore throat,                No sneezing, itching, ear ache, nasal congestion, post nasal drip,   CV:  No chest pain,  Orthopnea, PND, swelling in lower extremities, anasarca, dizziness, palpitations, syncope.   GI  No heartburn, indigestion, abdominal pain, nausea, vomiting, diarrhea, change in bowel habits, loss of appetite, bloody stools.   Resp:   No coughing up of blood. Marland Kitchen  No chest wall deformity  Skin: no rash or lesions.  GU: no dysuria, change in color of urine, no urgency or frequency.  No flank pain, no hematuria   MS:  No joint pain or swelling.  No decreased range of motion.  No back pain.  Psych:  No change in mood or affect. No depression or anxiety.  No memory loss.        Objective:   Physical Exam GEN: A/Ox3; pleasant , NAD, elderly   HEENT:  [place]/AT,  EACs-clear, TMs-wnl, NOSE-clear, THROAT-clear, no lesions, no postnasal drip or exudate noted.   NECK:  Supple w/ fair ROM; no JVD; normal carotid impulses w/o bruits; no thyromegaly or nodules palpated; no lymphadenopathy.  RESP  Coarse BS with  w/o, wheezes/ rales/ or rhonchi.no accessory muscle use, no dullness to percussion  CARD:  RRR, no m/r/g  , no peripheral edema, pulses intact, no cyanosis or clubbing.  GI:   Soft & nt; nml bowel sounds; no organomegaly or masses detected.  Musco: Warm bil, no  deformities or joint swelling noted.   Neuro: alert, no focal deficits noted.    Skin: Warm, no lesions or rashes         Assessment & Plan:

## 2010-08-16 NOTE — Progress Notes (Signed)
Note reviewed, and agree with treatment plan

## 2010-08-18 ENCOUNTER — Ambulatory Visit (INDEPENDENT_AMBULATORY_CARE_PROVIDER_SITE_OTHER): Payer: Medicare Other

## 2010-08-18 DIAGNOSIS — J45909 Unspecified asthma, uncomplicated: Secondary | ICD-10-CM

## 2010-08-21 MED ORDER — OMALIZUMAB 150 MG ~~LOC~~ SOLR
150.0000 mg | Freq: Once | SUBCUTANEOUS | Status: AC
  Administered 2010-08-21: 150 mg via SUBCUTANEOUS

## 2010-08-25 ENCOUNTER — Ambulatory Visit (INDEPENDENT_AMBULATORY_CARE_PROVIDER_SITE_OTHER): Payer: Medicare Other | Admitting: Pulmonary Disease

## 2010-08-25 ENCOUNTER — Encounter: Payer: Self-pay | Admitting: Pulmonary Disease

## 2010-08-25 DIAGNOSIS — J479 Bronchiectasis, uncomplicated: Secondary | ICD-10-CM

## 2010-08-25 DIAGNOSIS — J449 Chronic obstructive pulmonary disease, unspecified: Secondary | ICD-10-CM

## 2010-08-25 NOTE — Progress Notes (Signed)
  Subjective:    Patient ID: Joanna [name], female    DOB: [birth year], 77 y.o.   MRN: [id number]  HPI The pt comes in today for f/u after a recent episode of what sound like sinobronchitis. She was treated with abx, and has now returned to her usual baseline. She feels she is breathing well, and has no further cough or mucus.  Denies any chest congestion.  She has restarted her xolair after missing one dose while being sick.    Review of Systems  Constitutional: Negative for fever and unexpected weight change.  HENT: Positive for sneezing. Negative for ear pain, nosebleeds, congestion, sore throat, rhinorrhea, trouble swallowing, dental problem, postnasal drip and sinus pressure.   Eyes: Negative for redness and itching.  Respiratory: Positive for cough and shortness of breath. Negative for chest tightness and wheezing.   Cardiovascular: Negative for palpitations and leg swelling.  Gastrointestinal: Negative for nausea and vomiting.  Genitourinary: Negative for dysuria.  Musculoskeletal: Negative for joint swelling.  Skin: Negative for rash.  Neurological: Negative for headaches.  Hematological: Does not bruise/bleed easily.  Psychiatric/Behavioral: Negative for dysphoric mood. The patient is not nervous/anxious.        Objective:   Physical Exam  Wd female in nad Nares without purulence or discharge Chest with mildly decreased bs, no wheezing or rhonchi Cor with rrr LE without edema or cyanosis Alert, oriented, moves all 4       Assessment & Plan:

## 2010-08-25 NOTE — Patient Instructions (Signed)
No change in meds followup with me in 4mos.  Keep up with xolair injections.

## 2010-08-27 ENCOUNTER — Encounter: Payer: Self-pay | Admitting: Pulmonary Disease

## 2010-08-27 NOTE — Assessment & Plan Note (Signed)
The pt is doing well currently from a pulmonary standpoint, and feels she has returned to baseline after her recent episode of sinobronchitis.  I have asked her to continue on her maintenance meds, and to f/u with me again in 4mos.  She is to stay on xolair as well.

## 2010-08-27 NOTE — Assessment & Plan Note (Signed)
No recent true exacerbation.

## 2010-09-19 ENCOUNTER — Ambulatory Visit (INDEPENDENT_AMBULATORY_CARE_PROVIDER_SITE_OTHER): Payer: Medicare Other

## 2010-09-19 DIAGNOSIS — J45909 Unspecified asthma, uncomplicated: Secondary | ICD-10-CM

## 2010-09-21 DIAGNOSIS — J45909 Unspecified asthma, uncomplicated: Secondary | ICD-10-CM

## 2010-09-21 MED ORDER — OMALIZUMAB 150 MG ~~LOC~~ SOLR
150.0000 mg | Freq: Once | SUBCUTANEOUS | Status: AC
  Administered 2010-09-21: 150 mg via SUBCUTANEOUS

## 2010-10-06 LAB — URINALYSIS, ROUTINE W REFLEX MICROSCOPIC
Bilirubin Urine: NEGATIVE
Bilirubin Urine: NEGATIVE
Glucose, UA: NEGATIVE
Glucose, UA: NEGATIVE
Hgb urine dipstick: NEGATIVE
Ketones, ur: NEGATIVE
Ketones, ur: NEGATIVE
Nitrite: NEGATIVE
Nitrite: NEGATIVE
Protein, ur: NEGATIVE
Protein, ur: NEGATIVE
Specific Gravity, Urine: 1.018
Specific Gravity, Urine: 1.019
Urobilinogen, UA: 0.2
Urobilinogen, UA: 0.2
pH: 7.5
pH: 7

## 2010-10-06 LAB — DIFFERENTIAL
Basophils Absolute: 0
Basophils Relative: 0
Eosinophils Absolute: 0.1
Eosinophils Relative: 1
Lymphocytes Relative: 32
Lymphs Abs: 2.3
Monocytes Absolute: 0.6
Monocytes Relative: 8
Neutro Abs: 4.3
Neutrophils Relative %: 59

## 2010-10-06 LAB — CBC
HCT: 36.9
HCT: 35.4 — ABNORMAL LOW
Hemoglobin: 12.6
Hemoglobin: 12
MCHC: 34.1
MCHC: 33.9
MCV: 88
MCV: 89
Platelets: 271
Platelets: 275
RBC: 4.19
RBC: 3.98
RDW: 13.7
RDW: 14.1
WBC: 6.2
WBC: 7.3

## 2010-10-06 LAB — URINE MICROSCOPIC-ADD ON

## 2010-10-06 LAB — BASIC METABOLIC PANEL
BUN: 14
Chloride: 100
Creatinine, Ser: 0.95
Glucose, Bld: 96
Potassium: 5.3 — ABNORMAL HIGH

## 2010-10-06 LAB — I-STAT 8, (EC8 V) (CONVERTED LAB)
Bicarbonate: 25 — ABNORMAL HIGH
HCT: 38
Hemoglobin: 12.9
Operator id: 284251
Sodium: 131 — ABNORMAL LOW
TCO2: 26

## 2010-10-06 LAB — BASIC METABOLIC PANEL WITH GFR
CO2: 27
Calcium: 9.7
GFR calc Af Amer: >60
GFR calc non Af Amer: 57 — ABNORMAL LOW
Sodium: 135

## 2010-10-19 ENCOUNTER — Ambulatory Visit (INDEPENDENT_AMBULATORY_CARE_PROVIDER_SITE_OTHER): Payer: Medicare Other

## 2010-10-19 DIAGNOSIS — J45909 Unspecified asthma, uncomplicated: Secondary | ICD-10-CM

## 2010-10-19 MED ORDER — OMALIZUMAB 150 MG ~~LOC~~ SOLR
150.0000 mg | Freq: Once | SUBCUTANEOUS | Status: AC
  Administered 2010-10-19: 150 mg via SUBCUTANEOUS

## 2010-10-20 LAB — CBC
HCT: 35.1 — ABNORMAL LOW
MCHC: 34.5
MCV: 86.8
RBC: 4.04

## 2010-10-20 LAB — DIFFERENTIAL
Basophils Relative: 0
Eosinophils Absolute: 0.1
Eosinophils Relative: 2
Monocytes Relative: 6
Neutrophils Relative %: 67

## 2010-10-20 LAB — BASIC METABOLIC PANEL
BUN: 9
CO2: 23
Chloride: 96
Creatinine, Ser: 1.36 — ABNORMAL HIGH
GFR calc Af Amer: 46 — ABNORMAL LOW

## 2010-10-20 LAB — URINALYSIS, ROUTINE W REFLEX MICROSCOPIC
Bilirubin Urine: NEGATIVE
Hgb urine dipstick: NEGATIVE
Ketones, ur: NEGATIVE
Protein, ur: NEGATIVE
Urobilinogen, UA: 0.2

## 2010-10-20 LAB — URINE MICROSCOPIC-ADD ON

## 2010-11-24 ENCOUNTER — Ambulatory Visit (INDEPENDENT_AMBULATORY_CARE_PROVIDER_SITE_OTHER): Payer: Medicare Other

## 2010-11-24 DIAGNOSIS — J45909 Unspecified asthma, uncomplicated: Secondary | ICD-10-CM

## 2010-11-27 DIAGNOSIS — J45909 Unspecified asthma, uncomplicated: Secondary | ICD-10-CM

## 2010-11-27 MED ORDER — OMALIZUMAB 150 MG ~~LOC~~ SOLR
150.0000 mg | Freq: Once | SUBCUTANEOUS | Status: AC
  Administered 2010-11-27: 150 mg via SUBCUTANEOUS

## 2010-11-28 ENCOUNTER — Ambulatory Visit
Admission: RE | Admit: 2010-11-28 | Discharge: 2010-11-28 | Disposition: A | Discharge status: Home / Self Care | Payer: Medicare Other | Source: Ambulatory Visit | Attending: Gastroenterology | Admitting: Gastroenterology

## 2010-11-28 ENCOUNTER — Other Ambulatory Visit: Payer: Self-pay | Admitting: Gastroenterology

## 2010-11-28 DIAGNOSIS — R933 Abnormal findings on diagnostic imaging of other parts of digestive tract: Secondary | ICD-10-CM

## 2010-12-27 ENCOUNTER — Ambulatory Visit (INDEPENDENT_AMBULATORY_CARE_PROVIDER_SITE_OTHER): Payer: Medicare Other

## 2010-12-27 ENCOUNTER — Encounter: Payer: Self-pay | Admitting: Pulmonary Disease

## 2010-12-27 ENCOUNTER — Ambulatory Visit (INDEPENDENT_AMBULATORY_CARE_PROVIDER_SITE_OTHER): Payer: Medicare Other | Admitting: Pulmonary Disease

## 2010-12-27 DIAGNOSIS — J479 Bronchiectasis, uncomplicated: Secondary | ICD-10-CM

## 2010-12-27 DIAGNOSIS — J45909 Unspecified asthma, uncomplicated: Secondary | ICD-10-CM

## 2010-12-27 DIAGNOSIS — J449 Chronic obstructive pulmonary disease, unspecified: Secondary | ICD-10-CM

## 2010-12-27 DIAGNOSIS — J329 Chronic sinusitis, unspecified: Secondary | ICD-10-CM

## 2010-12-27 NOTE — Patient Instructions (Signed)
Continue with same inhaler medication and xolair injections.  Keep up with nasal rinses if having a lot of congestion or allergies. Can take zyrtec 10mg  or chlorpheniramine 8mg  at bedtime for your allergies if they are bothering you.  followup with me in 6mos.

## 2010-12-27 NOTE — Progress Notes (Signed)
  Subjective:    Patient ID: Joanna [name], female    DOB: Feb 13, 1928, 77 y.o.   MRN: [id number]  HPI The patient comes in today for followup of her known chronic obstructive asthma with significant allergic component.  She's been stable on Xolair injections along with a LABA/ICS.  She has not had an acute exacerbation since her last visit, but did have a viral-like illness that was short lived and did not result in respiratory distress.  She has not required her rescue inhaler very often since the last visit.  She is been keeping up with her sinus rinses, and has not had an episode of acute sinusitis.   Review of Systems  Constitutional: Negative for fever and unexpected weight change.  HENT: Positive for congestion, rhinorrhea and postnasal drip. Negative for ear pain, nosebleeds, sore throat, sneezing, trouble swallowing, dental problem and sinus pressure.   Eyes: Negative for redness and itching.  Respiratory: Positive for cough and chest tightness. Negative for shortness of breath and wheezing.   Cardiovascular: Positive for leg swelling. Negative for palpitations.  Gastrointestinal: Negative for nausea and vomiting.  Genitourinary: Negative for dysuria.  Musculoskeletal: Negative for joint swelling.  Skin: Negative for rash.  Neurological: Negative for headaches.  Hematological: Bruises/bleeds easily.  Psychiatric/Behavioral: Negative for dysphoric mood. The patient is not nervous/anxious.        Objective:   Physical Exam Well-developed female in no acute distress Nose without purulence or discharge noted Chest totally clear to auscultation, no wheezes or crackles Cardiac exam with regular rate and rhythm Lower extremities with no edema, no cyanosis noted Alert and oriented, moves all 4 extremities.       Assessment & Plan:

## 2010-12-27 NOTE — Assessment & Plan Note (Signed)
No recent flares since last visit.

## 2010-12-27 NOTE — Assessment & Plan Note (Signed)
Continue with sinus rinses.

## 2010-12-27 NOTE — Assessment & Plan Note (Signed)
The pt is stable on her asthma medications.  I have asked her to continue with xolair injections and inhalers, and to call if she has worsening symptoms.

## 2010-12-28 ENCOUNTER — Encounter: Payer: Self-pay | Admitting: Pulmonary Disease

## 2010-12-28 DIAGNOSIS — J45909 Unspecified asthma, uncomplicated: Secondary | ICD-10-CM

## 2010-12-28 MED ORDER — OMALIZUMAB 150 MG ~~LOC~~ SOLR
150.0000 mg | Freq: Once | SUBCUTANEOUS | Status: AC
  Administered 2010-12-28: 150 mg via SUBCUTANEOUS

## 2011-01-11 ENCOUNTER — Telehealth: Payer: Self-pay | Admitting: Pulmonary Disease

## 2011-01-11 MED ORDER — DOXYCYCLINE HYCLATE 100 MG PO TABS: ORAL_TABLET | ORAL | Status: DC

## 2011-01-11 MED ORDER — HYDROCODONE-HOMATROPINE 5-1.5 MG/5ML PO SYRP: 5.0000 mL | ORAL_SOLUTION | Freq: Four times a day (QID) | ORAL | Status: AC | PRN

## 2011-01-11 NOTE — Telephone Encounter (Signed)
Pt called back and she  Is aware of meds sent to the pharmacy per Cy recs.  Doxy and hydromet for cough. Pt stated that she will try to take these meds if she can.  Pt to call back for any problems or concerns.

## 2011-01-11 NOTE — Telephone Encounter (Signed)
ATC line busy x 3 still wcb

## 2011-01-11 NOTE — Telephone Encounter (Signed)
I spoke with pt and she c/o dry cough, chest tightness, chest congestion, increase SOB w/ exertion x yesterday. Pt states she is coughing so much she is strangling herself. Pt denies any fever, nausea, vomiting. Pt is requesting to have something called in for her. Since [facility] is not in will forward to Dr. Maple Hudson for recs. Please advise, thanks  Allergies  Allergen Reactions  . Caffeine   . Cefuroxime Axetil     REACTION: hives  . Clarithromycin   . Codeine   . Prednisone   . Sulfonamide Derivatives

## 2011-01-11 NOTE — Telephone Encounter (Signed)
For cough- can she tolerate hydrocodone/ hydromet   200 ml, 1 teaspoon 4 x daily prn cough ?  Try doxycycline 100 mg, # 8,   2 today then one daily  Push fluids

## 2011-01-11 NOTE — Telephone Encounter (Signed)
lmomtcb x1 

## 2011-01-11 NOTE — Telephone Encounter (Signed)
ATC line busy x 3 wcb 

## 2011-01-11 NOTE — Telephone Encounter (Signed)
Pt returning call can be reached at [phone].Raylene Everts

## 2011-01-23 ENCOUNTER — Telehealth: Payer: Self-pay | Admitting: Pulmonary Disease

## 2011-01-23 NOTE — Telephone Encounter (Signed)
I spoke with pt and she states her cough is no better. Pt is scheduled to come in and see TP on 01/25/11 at 9:15.

## 2011-01-25 ENCOUNTER — Encounter: Payer: Self-pay | Admitting: Adult Health

## 2011-01-25 ENCOUNTER — Ambulatory Visit (INDEPENDENT_AMBULATORY_CARE_PROVIDER_SITE_OTHER): Payer: Medicare Other

## 2011-01-25 ENCOUNTER — Ambulatory Visit (INDEPENDENT_AMBULATORY_CARE_PROVIDER_SITE_OTHER): Payer: Medicare Other | Admitting: Adult Health

## 2011-01-25 VITALS — BP 138/60 | HR 75 | Temp 97.0°F | Ht 67.0 in | Wt 136.2 lb

## 2011-01-25 DIAGNOSIS — J449 Chronic obstructive pulmonary disease, unspecified: Secondary | ICD-10-CM | POA: Diagnosis not present

## 2011-01-25 DIAGNOSIS — J45909 Unspecified asthma, uncomplicated: Secondary | ICD-10-CM

## 2011-01-25 MED ORDER — HYDROCODONE-HOMATROPINE 5-1.5 MG/5ML PO SYRP: 2.5000 mL | ORAL_SOLUTION | Freq: Four times a day (QID) | ORAL | Status: AC | PRN

## 2011-01-25 MED ORDER — OMALIZUMAB 150 MG ~~LOC~~ SOLR
150.0000 mg | Freq: Once | SUBCUTANEOUS | Status: AC
  Administered 2011-01-25: 150 mg via SUBCUTANEOUS

## 2011-01-25 MED ORDER — AMOXICILLIN-POT CLAVULANATE 875-125 MG PO TABS: 1.0000 | ORAL_TABLET | Freq: Two times a day (BID) | ORAL | Status: AC

## 2011-01-25 NOTE — Progress Notes (Signed)
  Subjective:    Patient ID: Joanna [name], female    DOB: 04/02/1928, 78 y.o.   MRN: [id number]  HPI  78 yo WF with known hx of COPD    01/25/2011  Pt presents for an acute office visit. Complains of persistent cough and congestion . Pt given Doxycycline and Hydromet on 01-11-11 with some improvement but still has cough. Still c/o prod cough (grey) and  SOB . Has upcoming colonoscopy next week and does not want to be sick.   Review of Systems  Constitutional:   No  weight loss, night sweats,  Cough   fatigue, or  lassitude.  HEENT:   No headaches,  Difficulty swallowing,  Tooth/dental problems, or  Sore throat,                No sneezing, itching, ear ache, nasal congestion, post nasal drip,   CV:  No chest pain,  Orthopnea, PND, swelling in lower extremities, anasarca, dizziness, palpitations, syncope.   GI  No heartburn, indigestion, abdominal pain, nausea, vomiting, diarrhea, change in bowel habits, loss of appetite, bloody stools.   Resp:   No coughing up of blood. Marland Kitchen  No chest wall deformity  Skin: no rash or lesions.  GU: no dysuria, change in color of urine, no urgency or frequency.  No flank pain, no hematuria   MS:  No joint pain or swelling.  No decreased range of motion.  No back pain.  Psych:  No change in mood or affect. No depression or anxiety.  No memory loss.        Objective:   Physical Exam  GEN: A/Ox3; pleasant , NAD, elderly   HEENT:  [place]/AT,  EACs-clear, TMs-wnl, NOSE-clear, THROAT-clear, no lesions, no postnasal drip or exudate noted.   NECK:  Supple w/ fair ROM; no JVD; normal carotid impulses w/o bruits; no thyromegaly or nodules palpated; no lymphadenopathy.  RESP  Coarse BS with  w/o, wheezes/ rales/ or rhonchi.no accessory muscle use, no dullness to percussion  CARD:  RRR, no m/r/g  , no peripheral edema, pulses intact, no cyanosis or clubbing.  GI:   Soft & nt; nml bowel sounds; no organomegaly or masses detected.  Musco: Warm bil, no  deformities or joint swelling noted.   Neuro: alert, no focal deficits noted.    Skin: Warm, no lesions or rashes         Assessment & Plan:

## 2011-01-25 NOTE — Assessment & Plan Note (Signed)
Slow to resolve flare   Plan:  Augmentin  Twice daily  For 7 days - take with food  --to have on hold if symptoms do not improve or worsen.  Mucinex DM Twice daily  As needed  Cough/congestion  Hydromet 1/2 - 1 tsp every 4-6 hrs As needed  Cough-may make you sleepy.  follow up Dr. Shelle Iron as planned and As needed   Please contact office for sooner follow up if symptoms do not improve or worsen or seek emergency care

## 2011-01-25 NOTE — Patient Instructions (Signed)
Augmentin  Twice daily  For 7 days - take with food  --to have on hold if symptoms do not improve or worsen.  Mucinex DM Twice daily  As needed  Cough/congestion  Hydromet 1/2 - 1 tsp every 4-6 hrs As needed  Cough-may make you sleepy.  follow up Dr. Shelle Iron as planned and As needed   Please contact office for sooner follow up if symptoms do not improve or worsen or seek emergency care

## 2011-02-23 ENCOUNTER — Ambulatory Visit (INDEPENDENT_AMBULATORY_CARE_PROVIDER_SITE_OTHER)
Admission: RE | Admit: 2011-02-23 | Discharge: 2011-02-23 | Disposition: A | Discharge status: Home / Self Care | Payer: Medicare Other | Source: Ambulatory Visit | Attending: Adult Health | Admitting: Adult Health

## 2011-02-23 ENCOUNTER — Encounter: Payer: Self-pay | Admitting: Adult Health

## 2011-02-23 ENCOUNTER — Ambulatory Visit (INDEPENDENT_AMBULATORY_CARE_PROVIDER_SITE_OTHER): Payer: Medicare Other | Admitting: Adult Health

## 2011-02-23 VITALS — BP 100/60 | HR 53 | Temp 98.0°F | Ht 67.0 in | Wt 137.0 lb

## 2011-02-23 DIAGNOSIS — R0602 Shortness of breath: Secondary | ICD-10-CM | POA: Diagnosis not present

## 2011-02-23 DIAGNOSIS — J449 Chronic obstructive pulmonary disease, unspecified: Secondary | ICD-10-CM

## 2011-02-23 DIAGNOSIS — R079 Chest pain, unspecified: Secondary | ICD-10-CM | POA: Diagnosis not present

## 2011-02-23 MED ORDER — HYDROCODONE-HOMATROPINE 5-1.5 MG/5ML PO SYRP: 5.0000 mL | ORAL_SOLUTION | Freq: Four times a day (QID) | ORAL | Status: AC | PRN

## 2011-02-23 MED ORDER — MOXIFLOXACIN HCL 400 MG PO TABS: 400.0000 mg | ORAL_TABLET | Freq: Every day | ORAL | Status: AC

## 2011-02-23 MED ORDER — LEVALBUTEROL HCL 0.63 MG/3ML IN NEBU
0.6300 mg | INHALATION_SOLUTION | Freq: Once | RESPIRATORY_TRACT | Status: AC
  Administered 2011-02-23: 0.63 mg via RESPIRATORY_TRACT

## 2011-02-23 NOTE — Assessment & Plan Note (Signed)
Slow to resolve flare  cxr pending  xopenex neb in office   Plan:  Avelox 400mg  daily for 7 days -take with food  Mucinex DM Twice daily  As needed  Cough/congestion  Hydromet 1/2 - 1 tsp every 4-6 hrs As needed  Cough-may make you sleepy.  follow up Dr. Shelle Iron in  2 weeks and As needed   Please contact office for sooner follow up if symptoms do not improve or worsen or seek emergency care

## 2011-02-23 NOTE — Progress Notes (Signed)
Addended by: Fenton Foy on: 02/23/2011 03:18 PM   Modules accepted: Orders

## 2011-02-23 NOTE — Patient Instructions (Addendum)
Avelox 400mg  daily for 7 days -take with food  Mucinex DM Twice daily  As needed  Cough/congestion  Hydromet 1/2 - 1 tsp every 4-6 hrs As needed  Cough-may make you sleepy.  follow up Dr. Shelle Iron in  2 weeks and As needed   Please contact office for sooner follow up if symptoms do not improve or worsen or seek emergency care

## 2011-02-23 NOTE — Progress Notes (Signed)
  Subjective:    Patient ID: Joanna [name], female    DOB: 1928-10-16, 78 y.o.   MRN: [id number]  HPI  78 yo WF with known hx of COPD    01/25/2011  Pt presents for an acute office visit. Complains of persistent cough and congestion . Pt given Doxycycline and Hydromet on 01-11-11 with some improvement but still has cough. Still c/o prod cough (grey) and  SOB . Has upcoming colonoscopy next week and does not want to be sick.  >>augmentin rx   02/23/2011 Acute OV  Complains of  pain in lower lungs under her ribs. also, cough with green to yellow mucus, sob, and, chest tightness for last 2 weeks.  Seen 1 month for same symtpoms with resolution-felt much better. Never feels she got completely over her cough/congesiton completely. Hydromet really helped her cough. No hemoptysis or chest pain. No fever or edema.      Review of Systems  Constitutional:   No  weight loss, night sweats,  Cough   fatigue, or  lassitude.  HEENT:   No headaches,  Difficulty swallowing,  Tooth/dental problems, or  Sore throat,                No sneezing, itching, ear ache, nasal congestion, post nasal drip,   CV:  No chest pain,  Orthopnea, PND, swelling in lower extremities, anasarca, dizziness, palpitations, syncope.   GI  No heartburn, indigestion, abdominal pain, nausea, vomiting, diarrhea, change in bowel habits, loss of appetite, bloody stools.   Resp:   No coughing up of blood. Marland Kitchen  No chest wall deformity  Skin: no rash or lesions.  GU: no dysuria, change in color of urine, no urgency or frequency.  No flank pain, no hematuria   MS:  No joint pain or swelling.  No decreased range of motion.  No back pain.  Psych:  No change in mood or affect. No depression or anxiety.  No memory loss.        Objective:   Physical Exam  GEN: A/Ox3; pleasant , NAD, elderly   HEENT:  [place]/AT,  EACs-clear, TMs-wnl, NOSE-clear, THROAT-clear, no lesions, no postnasal drip or exudate noted.   NECK:  Supple w/ fair  ROM; no JVD; normal carotid impulses w/o bruits; no thyromegaly or nodules palpated; no lymphadenopathy.  RESP  Coarse BS with  w/o, wheezes/ rales/ or rhonchi.no accessory muscle use, no dullness to percussion  CARD:  RRR, no m/r/g  , no peripheral edema, pulses intact, no cyanosis or clubbing.  GI:   Soft & nt; nml bowel sounds; no organomegaly or masses detected.  Musco: Warm bil, no deformities or joint swelling noted.   Neuro: alert, no focal deficits noted.    Skin: Warm, no lesions or rashes         Assessment & Plan:

## 2011-02-26 NOTE — Progress Notes (Signed)
Acute ov reviewed and agree with plan as outlined.  

## 2011-02-27 ENCOUNTER — Telehealth: Payer: Self-pay | Admitting: Pulmonary Disease

## 2011-02-27 NOTE — Telephone Encounter (Signed)
Notes Recorded by Rubye Oaks, NP on 02/23/2011 at 5:52 PM No sign of PNA Cont w/ ov recs  follow up as planned and As needed   lmomtcb

## 2011-03-01 NOTE — Telephone Encounter (Signed)
Called, spoke with pt.  I informed her no sign on PNA, cont with OV recs, follow up as planned ans as needed.  She verbalized understanding of these results and recs and voiced no further questions/concerns at this time.

## 2011-03-02 ENCOUNTER — Ambulatory Visit: Payer: Medicare Other

## 2011-03-13 ENCOUNTER — Ambulatory Visit (INDEPENDENT_AMBULATORY_CARE_PROVIDER_SITE_OTHER): Payer: Medicare Other | Admitting: Pulmonary Disease

## 2011-03-13 ENCOUNTER — Encounter: Payer: Self-pay | Admitting: Pulmonary Disease

## 2011-03-13 ENCOUNTER — Ambulatory Visit (INDEPENDENT_AMBULATORY_CARE_PROVIDER_SITE_OTHER): Payer: Medicare Other

## 2011-03-13 DIAGNOSIS — J4489 Other specified chronic obstructive pulmonary disease: Secondary | ICD-10-CM

## 2011-03-13 DIAGNOSIS — J329 Chronic sinusitis, unspecified: Secondary | ICD-10-CM

## 2011-03-13 DIAGNOSIS — J449 Chronic obstructive pulmonary disease, unspecified: Secondary | ICD-10-CM

## 2011-03-13 DIAGNOSIS — J45909 Unspecified asthma, uncomplicated: Secondary | ICD-10-CM

## 2011-03-13 DIAGNOSIS — J479 Bronchiectasis, uncomplicated: Secondary | ICD-10-CM

## 2011-03-13 MED ORDER — FLUTTER DEVI: Status: DC

## 2011-03-13 MED ORDER — OMALIZUMAB 150 MG ~~LOC~~ SOLR
150.0000 mg | Freq: Once | SUBCUTANEOUS | Status: AC
  Administered 2011-03-13: 150 mg via SUBCUTANEOUS

## 2011-03-13 NOTE — Patient Instructions (Signed)
No change in your asthma meds, continue xolair Can take mucinex dm extra strength one in am and pm everyday until congestion is better. Will start flutter valve am and pm until better, then can use as needed for chest congestion. followup with me in 3mos (cancel any old apptms).

## 2011-03-13 NOTE — Assessment & Plan Note (Signed)
The patient has had issues over the last month or so with recurrent pulmonary symptoms, but is much better after a few rounds of antibiotics.  She feels that she has nearly returned to her usual baseline.  She has a history of chronic sinusitis, but did not feel this was her issue when sick.  At this point, I would like to work more aggressively on mucociliary clearance, and will ask her to try a mucolytic and also a flutter valve.

## 2011-03-13 NOTE — Progress Notes (Signed)
  Subjective:    Patient ID: Joanna [name], female    DOB: 06-Oct-1928, 78 y.o.   MRN: [id number]  HPI The patient comes in today for followup of her known chronic asthma and bronchiectasis.  She is maintained on an aggressive bronchodilator regimen, and also Xolair injections.  She recently has had issues with recurrent sinopulmonary infections/issues, and has been treated with a few rounds of antibiotics over the last month or so.  She feels that she is much improved, and almost back to her usual baseline.  She denies any chest or sinus congestion currently.  Her cough is significantly improved, but she still at times has issues mobilizing her mucus.  She currently does not have a flutter valve.    Review of Systems  Constitutional: Negative for fever and unexpected weight change.  HENT: Positive for congestion, sneezing and postnasal drip. Negative for ear pain, nosebleeds, sore throat, rhinorrhea, trouble swallowing, dental problem and sinus pressure.   Eyes: Negative for redness and itching.  Respiratory: Positive for cough, shortness of breath and wheezing. Negative for chest tightness.   Cardiovascular: Positive for palpitations and leg swelling.  Gastrointestinal: Negative for nausea and vomiting.  Genitourinary: Positive for urgency. Negative for dysuria.  Musculoskeletal: Negative for joint swelling.  Skin: Negative for rash.  Neurological: Positive for headaches.  Hematological: Does not bruise/bleed easily.  Psychiatric/Behavioral: Negative for dysphoric mood. The patient is not nervous/anxious.        Objective:   Physical Exam Well-developed female in no acute distress Nose without purulence or discharge noted Chest is totally clear to auscultation, no wheezes or rhonchi Cardiac exam with regular rate and rhythm Lower extremities with minimal ankle edema, no cyanosis Alert and oriented, moves all 4 extremities.       Assessment & Plan:

## 2011-03-15 DIAGNOSIS — N318 Other neuromuscular dysfunction of bladder: Secondary | ICD-10-CM | POA: Diagnosis not present

## 2011-03-27 DIAGNOSIS — E7800 Pure hypercholesterolemia, unspecified: Secondary | ICD-10-CM | POA: Diagnosis not present

## 2011-03-27 DIAGNOSIS — N318 Other neuromuscular dysfunction of bladder: Secondary | ICD-10-CM | POA: Diagnosis not present

## 2011-03-27 DIAGNOSIS — E039 Hypothyroidism, unspecified: Secondary | ICD-10-CM | POA: Diagnosis not present

## 2011-05-10 DIAGNOSIS — K5900 Constipation, unspecified: Secondary | ICD-10-CM | POA: Diagnosis not present

## 2011-05-15 ENCOUNTER — Ambulatory Visit (INDEPENDENT_AMBULATORY_CARE_PROVIDER_SITE_OTHER): Payer: Medicare Other

## 2011-05-15 DIAGNOSIS — J45909 Unspecified asthma, uncomplicated: Secondary | ICD-10-CM

## 2011-05-15 MED ORDER — OMALIZUMAB 150 MG ~~LOC~~ SOLR
150.0000 mg | Freq: Once | SUBCUTANEOUS | Status: AC
  Administered 2011-05-15: 150 mg via SUBCUTANEOUS

## 2011-05-23 DIAGNOSIS — G479 Sleep disorder, unspecified: Secondary | ICD-10-CM | POA: Diagnosis not present

## 2011-05-23 DIAGNOSIS — E039 Hypothyroidism, unspecified: Secondary | ICD-10-CM | POA: Diagnosis not present

## 2011-05-23 DIAGNOSIS — R109 Unspecified abdominal pain: Secondary | ICD-10-CM | POA: Diagnosis not present

## 2011-05-23 DIAGNOSIS — I10 Essential (primary) hypertension: Secondary | ICD-10-CM | POA: Diagnosis not present

## 2011-06-08 ENCOUNTER — Ambulatory Visit (INDEPENDENT_AMBULATORY_CARE_PROVIDER_SITE_OTHER): Payer: Medicare Other

## 2011-06-08 DIAGNOSIS — J45909 Unspecified asthma, uncomplicated: Secondary | ICD-10-CM

## 2011-06-08 MED ORDER — OMALIZUMAB 150 MG ~~LOC~~ SOLR
150.0000 mg | Freq: Once | SUBCUTANEOUS | Status: AC
  Administered 2011-06-08: 150 mg via SUBCUTANEOUS

## 2011-06-12 ENCOUNTER — Ambulatory Visit (INDEPENDENT_AMBULATORY_CARE_PROVIDER_SITE_OTHER): Payer: Medicare Other | Admitting: Pulmonary Disease

## 2011-06-12 ENCOUNTER — Encounter: Payer: Self-pay | Admitting: Pulmonary Disease

## 2011-06-12 VITALS — BP 122/58 | HR 59 | Temp 97.8°F | Ht 67.0 in | Wt 139.2 lb

## 2011-06-12 DIAGNOSIS — J449 Chronic obstructive pulmonary disease, unspecified: Secondary | ICD-10-CM | POA: Diagnosis not present

## 2011-06-12 DIAGNOSIS — J309 Allergic rhinitis, unspecified: Secondary | ICD-10-CM | POA: Diagnosis not present

## 2011-06-12 NOTE — Patient Instructions (Signed)
Continue current breathing medications Stay on your sinus rinses am and pm until better Try chlorpheniramine 8mg  at bedtime and 4mg  at lunch for next few weeks to see if better. If your nasal congestion and cough continue, and not getting better, let us know. followup with me in 4mos.

## 2011-06-12 NOTE — Assessment & Plan Note (Signed)
The patient seems to be doing well from an asthma standpoint, with no acute exacerbation recently or excessive rescue inhaler use.  Her lungs are totally clear today on exam.  I've asked her to continue on her current medications.

## 2011-06-12 NOTE — Assessment & Plan Note (Signed)
The patient's history is most consistent with a flare of allergic rhinitis, although I cannot totally exclude a sinusitis.  I would like for her to take an antihistamine on a daily basis for a while, and also continue with her sinus rinses.  If she continues to have symptoms, may have to treat for acute sinusitis.

## 2011-06-12 NOTE — Progress Notes (Signed)
  Subjective:    Patient ID: Joanna [name], female    DOB: [birth year], 78 y.o.   MRN: [id number]  HPI The patient comes in today for followup of her known asthma and allergic rhinitis.  She is doing very well from a breathing standpoint, and has not required her rescue inhaler.  Her only complaint today is that of nasal congestion with postnasal drip and cough productive of discolored mucus.  She does not have sinus pressure or fever.  She is not taking an antihistamine on a regular basis currently, but is doing her nasal rinses.   Review of Systems  Constitutional: Negative.  Negative for fever and unexpected weight change.  HENT: Positive for congestion, sneezing and postnasal drip. Negative for ear pain, nosebleeds, sore throat, trouble swallowing, dental problem and sinus pressure.   Eyes: Positive for itching. Negative for redness.  Respiratory: Positive for cough. Negative for chest tightness, shortness of breath and wheezing.   Cardiovascular: Negative.  Negative for palpitations and leg swelling.  Gastrointestinal: Negative.  Negative for nausea and vomiting.  Genitourinary: Negative for dysuria.  Musculoskeletal: Negative.  Negative for joint swelling.  Skin: Negative.  Negative for rash.  Neurological: Negative.  Negative for headaches.  Hematological: Negative.  Does not bruise/bleed easily.  Psychiatric/Behavioral: Negative.  Negative for dysphoric mood. The patient is not nervous/anxious.        Objective:   Physical Exam Thin female in no acute distress Nose without purulence or discharge noted, but congested turbinates and mucous membranes. Chest totally clear auscultation Cardiac exam with regular rate and rhythm Lower extremities without edema, cyanosis Alert and oriented, moves all 4 extremities.       Assessment & Plan:

## 2011-06-15 ENCOUNTER — Telehealth: Payer: Self-pay | Admitting: Pulmonary Disease

## 2011-06-15 MED ORDER — LEVOFLOXACIN 750 MG PO TABS: 750.0000 mg | ORAL_TABLET | Freq: Every day | ORAL | Status: DC

## 2011-06-15 MED ORDER — LEVOFLOXACIN 750 MG PO TABS: 750.0000 mg | ORAL_TABLET | Freq: Every day | ORAL | Status: AC

## 2011-06-15 NOTE — Telephone Encounter (Signed)
Ok to call in levaquin 750mg  one a day for 7 days.  Call us if doesn't improve.

## 2011-06-15 NOTE — Telephone Encounter (Signed)
Pt called back and is afraid she is going to end up with pneumonia.  Not getting better.  Wants to know if [facility] wants to call her in an antibiotic. [phone] [name]

## 2011-06-15 NOTE — Telephone Encounter (Signed)
Called and spoke with pt who was just seen 5/28- states that using saline nasal rinses bid and taking chlorpheniramine as directed per [facility] is not helping with her symptoms. She states that for the past 24 hours she has developed tightness in chest and has increased cough that is mainly non prod, but sometimes is able to produce very minimal green sputum. Denies fever.  She states SOB seems slightly worse than normal for her today. Would like to know if [facility] feels that she needs abx. Please advise, thanks! Allergies  Allergen Reactions  . Caffeine   . Cefuroxime Axetil     REACTION: hives  . Clarithromycin   . Codeine   . Prednisone   . Sulfonamide Derivatives

## 2011-06-15 NOTE — Telephone Encounter (Signed)
I accidentally sent rx to [facility], so I called them and spoke with [facility] at [facility] and advised this is a mistake and to cancel this. They state this has not come across but [facility] made a note to cancel once it comes through. I sent rx to correct pharmacy. Pt is aware.Carron Curie, CMA

## 2011-06-18 ENCOUNTER — Telehealth: Payer: Self-pay | Admitting: Pulmonary Disease

## 2011-06-18 NOTE — Telephone Encounter (Signed)
Pt called to state she had appt with [facility] on 06-12-11;told to follow up in 4 months-wanted to cancel her appt on 06-27-11 at 145pm with [facility]. This has been taken care of and pt aware to call us sooner if need before 10-16-11 appt.

## 2011-06-27 ENCOUNTER — Ambulatory Visit: Payer: Medicare Other | Admitting: Pulmonary Disease

## 2011-07-16 ENCOUNTER — Ambulatory Visit: Payer: Medicare Other

## 2011-07-16 ENCOUNTER — Ambulatory Visit (INDEPENDENT_AMBULATORY_CARE_PROVIDER_SITE_OTHER): Payer: Medicare Other

## 2011-07-16 DIAGNOSIS — J45909 Unspecified asthma, uncomplicated: Secondary | ICD-10-CM

## 2011-07-17 MED ORDER — OMALIZUMAB 150 MG ~~LOC~~ SOLR
150.0000 mg | Freq: Once | SUBCUTANEOUS | Status: AC
  Administered 2011-07-17: 150 mg via SUBCUTANEOUS

## 2011-08-15 ENCOUNTER — Ambulatory Visit (INDEPENDENT_AMBULATORY_CARE_PROVIDER_SITE_OTHER): Payer: Medicare Other

## 2011-08-15 DIAGNOSIS — J45909 Unspecified asthma, uncomplicated: Secondary | ICD-10-CM

## 2011-08-15 MED ORDER — OMALIZUMAB 150 MG ~~LOC~~ SOLR
150.0000 mg | Freq: Once | SUBCUTANEOUS | Status: AC
  Administered 2011-08-15: 150 mg via SUBCUTANEOUS

## 2011-09-14 ENCOUNTER — Ambulatory Visit (INDEPENDENT_AMBULATORY_CARE_PROVIDER_SITE_OTHER): Payer: Medicare Other

## 2011-09-14 DIAGNOSIS — J45909 Unspecified asthma, uncomplicated: Secondary | ICD-10-CM | POA: Diagnosis not present

## 2011-09-14 MED ORDER — OMALIZUMAB 150 MG ~~LOC~~ SOLR
150.0000 mg | Freq: Once | SUBCUTANEOUS | Status: AC
  Administered 2011-09-14: 150 mg via SUBCUTANEOUS

## 2011-09-25 DIAGNOSIS — E871 Hypo-osmolality and hyponatremia: Secondary | ICD-10-CM | POA: Diagnosis not present

## 2011-09-25 DIAGNOSIS — Z23 Encounter for immunization: Secondary | ICD-10-CM | POA: Diagnosis not present

## 2011-09-25 DIAGNOSIS — R42 Dizziness and giddiness: Secondary | ICD-10-CM | POA: Diagnosis not present

## 2011-09-25 DIAGNOSIS — I10 Essential (primary) hypertension: Secondary | ICD-10-CM | POA: Diagnosis not present

## 2011-10-15 ENCOUNTER — Ambulatory Visit (INDEPENDENT_AMBULATORY_CARE_PROVIDER_SITE_OTHER): Payer: Medicare Other

## 2011-10-15 DIAGNOSIS — J45909 Unspecified asthma, uncomplicated: Secondary | ICD-10-CM

## 2011-10-16 ENCOUNTER — Ambulatory Visit (INDEPENDENT_AMBULATORY_CARE_PROVIDER_SITE_OTHER): Payer: Medicare Other | Admitting: Pulmonary Disease

## 2011-10-16 ENCOUNTER — Encounter: Payer: Self-pay | Admitting: Pulmonary Disease

## 2011-10-16 ENCOUNTER — Ambulatory Visit: Payer: Medicare Other | Admitting: Pulmonary Disease

## 2011-10-16 VITALS — BP 138/72 | HR 74 | Ht 67.0 in | Wt 135.6 lb

## 2011-10-16 DIAGNOSIS — J479 Bronchiectasis, uncomplicated: Secondary | ICD-10-CM | POA: Diagnosis not present

## 2011-10-16 DIAGNOSIS — J449 Chronic obstructive pulmonary disease, unspecified: Secondary | ICD-10-CM | POA: Diagnosis not present

## 2011-10-16 MED ORDER — OMALIZUMAB 150 MG ~~LOC~~ SOLR
150.0000 mg | Freq: Once | SUBCUTANEOUS | Status: AC
  Administered 2011-10-16: 150 mg via SUBCUTANEOUS

## 2011-10-16 NOTE — Patient Instructions (Addendum)
No change in current medications. folllowup with me in 4mos.

## 2011-10-16 NOTE — Progress Notes (Signed)
  Subjective:    Patient ID: Joanna [name], female    DOB: 1928/08/25, 78 y.o.   MRN: [id number]  HPI The patient comes in today for followup of her known asthma with significant allergic component.  Overall, she has done extremely well since the last visit, and tells me that she has not had an acute exacerbation.  She feels that her breathing is at a reasonable baseline, and has not had any recent cough or congestion.  She is starting to have a few more allergy symptoms with the change in weather and the approach of fall   Review of Systems  Constitutional: Negative for fever and unexpected weight change.  HENT: Positive for congestion, rhinorrhea and sinus pressure. Negative for ear pain, nosebleeds, sore throat, sneezing, trouble swallowing, dental problem and postnasal drip.   Eyes: Negative for redness and itching.  Respiratory: Positive for cough, shortness of breath and wheezing. Negative for chest tightness.   Cardiovascular: Negative for palpitations and leg swelling.  Gastrointestinal: Negative for nausea and vomiting.  Genitourinary: Negative for dysuria.  Musculoskeletal: Positive for joint swelling.  Skin: Negative for rash.  Neurological: Positive for headaches.  Hematological: Bruises/bleeds easily.  Psychiatric/Behavioral: Positive for dysphoric mood. The patient is nervous/anxious.        Objective:   Physical Exam Thin female in no acute distress Nose without purulence or discharge noted Chest with mild decrease in breath sounds, no wheezing noted, adequate air flow. Cardiac exam with regular rate and rhythm Lower extremities without edema, no cyanosis Alert and oriented, moves all 4 extremities.       Assessment & Plan:

## 2011-10-16 NOTE — Assessment & Plan Note (Signed)
The patient appears to be at a stable baseline from her chronic obstructive asthma.  I have asked her to continue on her current bronchodilator regimen, as well as her Xolair injections.

## 2011-11-06 DIAGNOSIS — G609 Hereditary and idiopathic neuropathy, unspecified: Secondary | ICD-10-CM | POA: Diagnosis not present

## 2011-11-06 DIAGNOSIS — I10 Essential (primary) hypertension: Secondary | ICD-10-CM | POA: Diagnosis not present

## 2011-11-06 DIAGNOSIS — M899 Disorder of bone, unspecified: Secondary | ICD-10-CM | POA: Diagnosis not present

## 2011-11-06 DIAGNOSIS — E559 Vitamin D deficiency, unspecified: Secondary | ICD-10-CM | POA: Diagnosis not present

## 2011-11-06 DIAGNOSIS — M949 Disorder of cartilage, unspecified: Secondary | ICD-10-CM | POA: Diagnosis not present

## 2011-11-06 DIAGNOSIS — E871 Hypo-osmolality and hyponatremia: Secondary | ICD-10-CM | POA: Diagnosis not present

## 2011-11-06 DIAGNOSIS — E039 Hypothyroidism, unspecified: Secondary | ICD-10-CM | POA: Diagnosis not present

## 2011-11-13 ENCOUNTER — Other Ambulatory Visit: Payer: Self-pay | Admitting: Gastroenterology

## 2011-11-13 ENCOUNTER — Ambulatory Visit
Admission: RE | Admit: 2011-11-13 | Discharge: 2011-11-13 | Disposition: A | Discharge status: Home / Self Care | Payer: Medicare Other | Source: Ambulatory Visit | Attending: Gastroenterology | Admitting: Gastroenterology

## 2011-11-13 ENCOUNTER — Ambulatory Visit (INDEPENDENT_AMBULATORY_CARE_PROVIDER_SITE_OTHER): Payer: Medicare Other

## 2011-11-13 DIAGNOSIS — K5900 Constipation, unspecified: Secondary | ICD-10-CM

## 2011-11-13 DIAGNOSIS — R109 Unspecified abdominal pain: Secondary | ICD-10-CM | POA: Diagnosis not present

## 2011-11-13 DIAGNOSIS — J45909 Unspecified asthma, uncomplicated: Secondary | ICD-10-CM

## 2011-11-14 MED ORDER — OMALIZUMAB 150 MG ~~LOC~~ SOLR
150.0000 mg | Freq: Once | SUBCUTANEOUS | Status: AC
  Administered 2011-11-14: 150 mg via SUBCUTANEOUS

## 2011-11-19 DIAGNOSIS — Z1211 Encounter for screening for malignant neoplasm of colon: Secondary | ICD-10-CM | POA: Diagnosis not present

## 2011-11-19 DIAGNOSIS — K5900 Constipation, unspecified: Secondary | ICD-10-CM | POA: Diagnosis not present

## 2011-12-20 ENCOUNTER — Ambulatory Visit (INDEPENDENT_AMBULATORY_CARE_PROVIDER_SITE_OTHER): Payer: Medicare Other

## 2011-12-20 DIAGNOSIS — J45909 Unspecified asthma, uncomplicated: Secondary | ICD-10-CM

## 2011-12-25 DIAGNOSIS — J45909 Unspecified asthma, uncomplicated: Secondary | ICD-10-CM | POA: Diagnosis not present

## 2011-12-25 DIAGNOSIS — R82998 Other abnormal findings in urine: Secondary | ICD-10-CM | POA: Diagnosis not present

## 2011-12-25 DIAGNOSIS — R350 Frequency of micturition: Secondary | ICD-10-CM | POA: Diagnosis not present

## 2011-12-25 MED ORDER — OMALIZUMAB 150 MG ~~LOC~~ SOLR
150.0000 mg | Freq: Once | SUBCUTANEOUS | Status: AC
  Administered 2011-12-25: 150 mg via SUBCUTANEOUS

## 2012-01-07 ENCOUNTER — Telehealth: Payer: Self-pay | Admitting: Pulmonary Disease

## 2012-01-07 NOTE — Telephone Encounter (Signed)
(  continued)  I offered to set an appt for pt who declined saying she just needs some meds called in.  Joanna [name]

## 2012-01-07 NOTE — Telephone Encounter (Signed)
I spoke with the pt and she is c/o having increased SOB with activity, pain under her left shoulder blade when she takes a deep breath, productive cough with green phlegm all x 3 days. App set for tomorrow at 10:15am. Pt advised if symptoms get worse to go to ER or call on call if after hours. Carron Curie, CMA

## 2012-01-08 ENCOUNTER — Encounter: Payer: Self-pay | Admitting: Adult Health

## 2012-01-08 ENCOUNTER — Ambulatory Visit (INDEPENDENT_AMBULATORY_CARE_PROVIDER_SITE_OTHER): Payer: Medicare Other | Admitting: Adult Health

## 2012-01-08 VITALS — BP 130/70 | HR 83 | Temp 98.3°F | Ht 67.0 in | Wt 134.4 lb

## 2012-01-08 DIAGNOSIS — J449 Chronic obstructive pulmonary disease, unspecified: Secondary | ICD-10-CM

## 2012-01-08 MED ORDER — HYDROCODONE-HOMATROPINE 5-1.5 MG/5ML PO SYRP: 5.0000 mL | ORAL_SOLUTION | Freq: Four times a day (QID) | ORAL | Status: AC | PRN

## 2012-01-08 MED ORDER — LEVOFLOXACIN 500 MG PO TABS: 500.0000 mg | ORAL_TABLET | Freq: Every day | ORAL | Status: DC

## 2012-01-08 NOTE — Patient Instructions (Addendum)
Levaquin 500mg  daily for 7 days -this will cover bronchitis and UTI  Mucinex DM Twice daily  As needed  Cough/congestion  Hydromet 1/2 - 1 tsp every 4-6 hrs As needed  Cough-may make you sleepy.  follow up Dr. Shelle Iron as planned and As needed   Please contact office for sooner follow up if symptoms do not improve or worsen or seek emergency care

## 2012-01-08 NOTE — Progress Notes (Signed)
  Subjective:    Patient ID: Joanna [name], female    DOB: 1928/09/06, 78 y.o.   MRN: [id number]  HPI 01/08/12 Acute OV  Complains of  prod cough w yellow/green mucus for 1 week.  No fever or chest pain  otc not helping  Cough is getting worse.   Review of Systems Constitutional:   No  weight loss, night sweats,  Fevers, chills,  +fatigue, or  lassitude.  HEENT:   No headaches,  Difficulty swallowing,  Tooth/dental problems, or  Sore throat,                No sneezing, itching, ear ache,  +nasal congestion, post nasal drip,   CV:  No chest pain,  Orthopnea, PND, swelling in lower extremities, anasarca, dizziness, palpitations, syncope.   GI  No heartburn, indigestion, abdominal pain, nausea, vomiting, diarrhea, change in bowel habits, loss of appetite, bloody stools.   Resp:  o coughing up of blood.   Marland Kitchen  No chest wall deformity  Skin: no rash or lesions.  GU: no dysuria, change in color of urine, no urgency or frequency.  No flank pain, no hematuria   MS:  No joint pain or swelling.  No decreased range of motion.  No back pain.  Psych:  No change in mood or affect. No depression or anxiety.  No memory loss.         Objective:   Physical Exam  GEN: A/Ox3; pleasant , NAD, well nourished   HEENT:  [place]/AT,  EACs-clear, TMs-wnl, NOSE-clear, THROAT-clear, no lesions, no postnasal drip or exudate noted.   NECK:  Supple w/ fair ROM; no JVD; normal carotid impulses w/o bruits; no thyromegaly or nodules palpated; no lymphadenopathy.  RESP  Few rhonchi , no accessory muscle use, no dullness to percussion  CARD:  RRR, no m/r/g  , no peripheral edema, pulses intact, no cyanosis or clubbing.  GI:   Soft & nt; nml bowel sounds; no organomegaly or masses detected.  Musco: Warm bil, no deformities or joint swelling noted.   Neuro: alert, no focal deficits noted.    Skin: Warm, no lesions or rashes        Assessment & Plan:

## 2012-01-15 NOTE — Assessment & Plan Note (Signed)
Flare ?UTI per pt  Advised if UTI symptoms not reoslving see PCP   Plan Levaquin 500mg  daily for 7 days -this will cover bronchitis and UTI  Mucinex DM Twice daily  As needed  Cough/congestion  Hydromet 1/2 - 1 tsp every 4-6 hrs As needed  Cough-may make you sleepy.  follow up Dr. Shelle Iron as planned and As needed   Please contact office for sooner follow up if symptoms do not improve or worsen or seek emergency care

## 2012-01-17 ENCOUNTER — Ambulatory Visit: Payer: Medicare Other

## 2012-01-25 ENCOUNTER — Ambulatory Visit (INDEPENDENT_AMBULATORY_CARE_PROVIDER_SITE_OTHER): Payer: Medicare Other

## 2012-01-25 DIAGNOSIS — J45909 Unspecified asthma, uncomplicated: Secondary | ICD-10-CM | POA: Diagnosis not present

## 2012-01-30 MED ORDER — OMALIZUMAB 150 MG ~~LOC~~ SOLR
150.0000 mg | Freq: Once | SUBCUTANEOUS | Status: AC
  Administered 2012-01-30: 150 mg via SUBCUTANEOUS

## 2012-02-19 ENCOUNTER — Encounter: Payer: Self-pay | Admitting: Pulmonary Disease

## 2012-02-19 ENCOUNTER — Ambulatory Visit (INDEPENDENT_AMBULATORY_CARE_PROVIDER_SITE_OTHER): Payer: Medicare Other | Admitting: Pulmonary Disease

## 2012-02-19 VITALS — BP 122/60 | HR 73 | Temp 97.7°F | Ht 67.5 in | Wt 130.4 lb

## 2012-02-19 DIAGNOSIS — J479 Bronchiectasis, uncomplicated: Secondary | ICD-10-CM | POA: Diagnosis not present

## 2012-02-19 DIAGNOSIS — J449 Chronic obstructive pulmonary disease, unspecified: Secondary | ICD-10-CM | POA: Diagnosis not present

## 2012-02-19 NOTE — Patient Instructions (Addendum)
Stay on symbicort TWICE a day.  It will help your mucus and breathing.  Use flutter valve am and pm when having increased mucus Stay on xolair Stay as active as possible followup with me in 4mos.

## 2012-02-19 NOTE — Progress Notes (Signed)
  Subjective:    Patient ID: Joanna [name], female    DOB: 03-21-1928, 79 y.o.   MRN: [id number]  HPI Patient comes in today for followup of her known chronic obstructive asthma and bronchiectasis.  She has been maintaining on Xolair, but has not been as compliant with symbicort.  She had an acute exacerbation in December, but has returned to baseline after treatment.  She still is having mucus production at times, probably related to her bronchiectasis.  She does use the flutter valve at times, he feels that it does help.  I have asked her to be more compliant with this.  She feels that her breathing is at her usual baseline.   Review of Systems  Constitutional: Negative for fever and unexpected weight change.  HENT: Negative for ear pain, nosebleeds, congestion, sore throat, rhinorrhea, sneezing, trouble swallowing, dental problem, postnasal drip and sinus pressure.   Eyes: Negative for redness and itching.  Respiratory: Positive for cough. Negative for chest tightness, shortness of breath and wheezing.   Cardiovascular: Negative for palpitations and leg swelling.  Gastrointestinal: Negative for nausea and vomiting.  Genitourinary: Negative for dysuria.  Musculoskeletal: Negative for joint swelling.  Skin: Negative for rash.  Neurological: Negative for headaches.  Hematological: Does not bruise/bleed easily.  Psychiatric/Behavioral: Negative for dysphoric mood. The patient is not nervous/anxious.        Objective:   Physical Exam Thin female in no acute distress Nose without purulence or discharge noted Neck without lymphadenopathy or thyromegaly Chest with decreased breath sounds, but no crackles, wheezes, or rhonchi. Cardiac exam was regular rate and rhythm Lower extremities with minimal edema, no cyanosis Alert and oriented, moves all 4 extremities.       Assessment & Plan:

## 2012-02-19 NOTE — Assessment & Plan Note (Signed)
The patient has been compliant on Xolair, but I have stressed the importance of taking symbicort twice a day as well.  She also needs to be more active, and to work on physical conditioning.

## 2012-02-19 NOTE — Assessment & Plan Note (Signed)
The patient has known bronchiectasis with chronic mucus production.  She seems to be doing well from a pulmonary toilet standpoint, but I have asked her to be more consistent with flutter valve when she has increased mucus production.

## 2012-02-22 ENCOUNTER — Ambulatory Visit: Payer: Medicare Other

## 2012-03-11 ENCOUNTER — Ambulatory Visit (INDEPENDENT_AMBULATORY_CARE_PROVIDER_SITE_OTHER): Payer: Medicare Other

## 2012-03-11 DIAGNOSIS — J45909 Unspecified asthma, uncomplicated: Secondary | ICD-10-CM | POA: Diagnosis not present

## 2012-03-11 DIAGNOSIS — E039 Hypothyroidism, unspecified: Secondary | ICD-10-CM | POA: Diagnosis not present

## 2012-03-11 DIAGNOSIS — M899 Disorder of bone, unspecified: Secondary | ICD-10-CM | POA: Diagnosis not present

## 2012-03-11 DIAGNOSIS — I10 Essential (primary) hypertension: Secondary | ICD-10-CM | POA: Diagnosis not present

## 2012-03-11 DIAGNOSIS — E559 Vitamin D deficiency, unspecified: Secondary | ICD-10-CM | POA: Diagnosis not present

## 2012-03-11 MED ORDER — OMALIZUMAB 150 MG ~~LOC~~ SOLR
150.0000 mg | Freq: Once | SUBCUTANEOUS | Status: AC
  Administered 2012-03-11: 150 mg via SUBCUTANEOUS

## 2012-03-12 DIAGNOSIS — M899 Disorder of bone, unspecified: Secondary | ICD-10-CM | POA: Diagnosis not present

## 2012-03-12 DIAGNOSIS — M949 Disorder of cartilage, unspecified: Secondary | ICD-10-CM | POA: Diagnosis not present

## 2012-04-03 DIAGNOSIS — M542 Cervicalgia: Secondary | ICD-10-CM | POA: Diagnosis not present

## 2012-04-03 DIAGNOSIS — S300XXA Contusion of lower back and pelvis, initial encounter: Secondary | ICD-10-CM | POA: Diagnosis not present

## 2012-04-03 DIAGNOSIS — M25559 Pain in unspecified hip: Secondary | ICD-10-CM | POA: Diagnosis not present

## 2012-04-03 DIAGNOSIS — S139XXA Sprain of joints and ligaments of unspecified parts of neck, initial encounter: Secondary | ICD-10-CM | POA: Diagnosis not present

## 2012-04-09 ENCOUNTER — Ambulatory Visit: Payer: Medicare Other

## 2012-04-11 ENCOUNTER — Ambulatory Visit (INDEPENDENT_AMBULATORY_CARE_PROVIDER_SITE_OTHER): Payer: Medicare Other

## 2012-04-11 DIAGNOSIS — J45909 Unspecified asthma, uncomplicated: Secondary | ICD-10-CM

## 2012-04-14 DIAGNOSIS — J45909 Unspecified asthma, uncomplicated: Secondary | ICD-10-CM | POA: Diagnosis not present

## 2012-04-14 MED ORDER — OMALIZUMAB 150 MG ~~LOC~~ SOLR
150.0000 mg | Freq: Once | SUBCUTANEOUS | Status: AC
  Administered 2012-04-14: 150 mg via SUBCUTANEOUS

## 2012-05-03 ENCOUNTER — Emergency Department (HOSPITAL_COMMUNITY): Payer: Medicare Other

## 2012-05-03 ENCOUNTER — Encounter (HOSPITAL_COMMUNITY): Payer: Self-pay | Admitting: Emergency Medicine

## 2012-05-03 ENCOUNTER — Inpatient Hospital Stay (HOSPITAL_COMMUNITY)
Admission: EM | Admit: 2012-05-03 | Discharge: 2012-05-07 | DRG: 470 | Disposition: A | Discharge status: Skilled Nursing Facility | Payer: Medicare Other | Attending: Internal Medicine | Admitting: Internal Medicine

## 2012-05-03 DIAGNOSIS — E876 Hypokalemia: Secondary | ICD-10-CM | POA: Diagnosis not present

## 2012-05-03 DIAGNOSIS — S72009D Fracture of unspecified part of neck of unspecified femur, subsequent encounter for closed fracture with routine healing: Secondary | ICD-10-CM | POA: Diagnosis not present

## 2012-05-03 DIAGNOSIS — R262 Difficulty in walking, not elsewhere classified: Secondary | ICD-10-CM | POA: Diagnosis not present

## 2012-05-03 DIAGNOSIS — S298XXA Other specified injuries of thorax, initial encounter: Secondary | ICD-10-CM | POA: Diagnosis not present

## 2012-05-03 DIAGNOSIS — J309 Allergic rhinitis, unspecified: Secondary | ICD-10-CM

## 2012-05-03 DIAGNOSIS — J4489 Other specified chronic obstructive pulmonary disease: Secondary | ICD-10-CM | POA: Diagnosis not present

## 2012-05-03 DIAGNOSIS — I10 Essential (primary) hypertension: Secondary | ICD-10-CM | POA: Diagnosis not present

## 2012-05-03 DIAGNOSIS — I519 Heart disease, unspecified: Secondary | ICD-10-CM | POA: Diagnosis present

## 2012-05-03 DIAGNOSIS — D649 Anemia, unspecified: Secondary | ICD-10-CM | POA: Diagnosis not present

## 2012-05-03 DIAGNOSIS — F0390 Unspecified dementia without behavioral disturbance: Secondary | ICD-10-CM | POA: Diagnosis present

## 2012-05-03 DIAGNOSIS — S72043A Displaced fracture of base of neck of unspecified femur, initial encounter for closed fracture: Secondary | ICD-10-CM | POA: Diagnosis not present

## 2012-05-03 DIAGNOSIS — J449 Chronic obstructive pulmonary disease, unspecified: Secondary | ICD-10-CM

## 2012-05-03 DIAGNOSIS — R41841 Cognitive communication deficit: Secondary | ICD-10-CM | POA: Diagnosis not present

## 2012-05-03 DIAGNOSIS — S72002D Fracture of unspecified part of neck of left femur, subsequent encounter for closed fracture with routine healing: Secondary | ICD-10-CM

## 2012-05-03 DIAGNOSIS — S52599A Other fractures of lower end of unspecified radius, initial encounter for closed fracture: Secondary | ICD-10-CM | POA: Diagnosis not present

## 2012-05-03 DIAGNOSIS — K589 Irritable bowel syndrome without diarrhea: Secondary | ICD-10-CM

## 2012-05-03 DIAGNOSIS — K5730 Diverticulosis of large intestine without perforation or abscess without bleeding: Secondary | ICD-10-CM

## 2012-05-03 DIAGNOSIS — M6281 Muscle weakness (generalized): Secondary | ICD-10-CM | POA: Diagnosis not present

## 2012-05-03 DIAGNOSIS — K219 Gastro-esophageal reflux disease without esophagitis: Secondary | ICD-10-CM | POA: Diagnosis present

## 2012-05-03 DIAGNOSIS — T148XXA Other injury of unspecified body region, initial encounter: Secondary | ICD-10-CM | POA: Diagnosis not present

## 2012-05-03 DIAGNOSIS — S52302D Unspecified fracture of shaft of left radius, subsequent encounter for closed fracture with routine healing: Secondary | ICD-10-CM

## 2012-05-03 DIAGNOSIS — S52509A Unspecified fracture of the lower end of unspecified radius, initial encounter for closed fracture: Secondary | ICD-10-CM | POA: Diagnosis present

## 2012-05-03 DIAGNOSIS — N390 Urinary tract infection, site not specified: Secondary | ICD-10-CM | POA: Diagnosis present

## 2012-05-03 DIAGNOSIS — J479 Bronchiectasis, uncomplicated: Secondary | ICD-10-CM

## 2012-05-03 DIAGNOSIS — W19XXXA Unspecified fall, initial encounter: Secondary | ICD-10-CM | POA: Diagnosis present

## 2012-05-03 DIAGNOSIS — Z471 Aftercare following joint replacement surgery: Secondary | ICD-10-CM | POA: Diagnosis not present

## 2012-05-03 DIAGNOSIS — J329 Chronic sinusitis, unspecified: Secondary | ICD-10-CM

## 2012-05-03 DIAGNOSIS — S52612A Displaced fracture of left ulna styloid process, initial encounter for closed fracture: Secondary | ICD-10-CM

## 2012-05-03 DIAGNOSIS — B965 Pseudomonas (aeruginosa) (mallei) (pseudomallei) as the cause of diseases classified elsewhere: Secondary | ICD-10-CM | POA: Diagnosis present

## 2012-05-03 DIAGNOSIS — E039 Hypothyroidism, unspecified: Secondary | ICD-10-CM | POA: Diagnosis present

## 2012-05-03 DIAGNOSIS — E871 Hypo-osmolality and hyponatremia: Secondary | ICD-10-CM | POA: Diagnosis not present

## 2012-05-03 DIAGNOSIS — M25539 Pain in unspecified wrist: Secondary | ICD-10-CM | POA: Diagnosis not present

## 2012-05-03 DIAGNOSIS — W010XXA Fall on same level from slipping, tripping and stumbling without subsequent striking against object, initial encounter: Secondary | ICD-10-CM | POA: Diagnosis not present

## 2012-05-03 DIAGNOSIS — S72009A Fracture of unspecified part of neck of unspecified femur, initial encounter for closed fracture: Secondary | ICD-10-CM | POA: Diagnosis not present

## 2012-05-03 DIAGNOSIS — S52202A Unspecified fracture of shaft of left ulna, initial encounter for closed fracture: Secondary | ICD-10-CM

## 2012-05-03 DIAGNOSIS — Y92009 Unspecified place in unspecified non-institutional (private) residence as the place of occurrence of the external cause: Secondary | ICD-10-CM

## 2012-05-03 DIAGNOSIS — S52609A Unspecified fracture of lower end of unspecified ulna, initial encounter for closed fracture: Secondary | ICD-10-CM | POA: Diagnosis present

## 2012-05-03 DIAGNOSIS — Z96649 Presence of unspecified artificial hip joint: Secondary | ICD-10-CM | POA: Diagnosis not present

## 2012-05-03 DIAGNOSIS — S72002A Fracture of unspecified part of neck of left femur, initial encounter for closed fracture: Secondary | ICD-10-CM

## 2012-05-03 DIAGNOSIS — M25559 Pain in unspecified hip: Secondary | ICD-10-CM | POA: Diagnosis not present

## 2012-05-03 DIAGNOSIS — S52202D Unspecified fracture of shaft of left ulna, subsequent encounter for closed fracture with routine healing: Secondary | ICD-10-CM

## 2012-05-03 DIAGNOSIS — S52209A Unspecified fracture of shaft of unspecified ulna, initial encounter for closed fracture: Secondary | ICD-10-CM | POA: Diagnosis not present

## 2012-05-03 DIAGNOSIS — J90 Pleural effusion, not elsewhere classified: Secondary | ICD-10-CM | POA: Diagnosis not present

## 2012-05-03 DIAGNOSIS — S5290XB Unspecified fracture of unspecified forearm, initial encounter for open fracture type I or II: Secondary | ICD-10-CM | POA: Diagnosis not present

## 2012-05-03 DIAGNOSIS — D62 Acute posthemorrhagic anemia: Secondary | ICD-10-CM | POA: Diagnosis not present

## 2012-05-03 DIAGNOSIS — S52502A Unspecified fracture of the lower end of left radius, initial encounter for closed fracture: Secondary | ICD-10-CM

## 2012-05-03 DIAGNOSIS — W010XXD Fall on same level from slipping, tripping and stumbling without subsequent striking against object, subsequent encounter: Secondary | ICD-10-CM

## 2012-05-03 MED ORDER — FENTANYL CITRATE 0.05 MG/ML IJ SOLN
50.0000 ug | INTRAMUSCULAR | Status: AC | PRN
  Administered 2012-05-03 – 2012-05-04 (×2): 50 ug via INTRAVENOUS
  Filled 2012-05-03 (×2): qty 2

## 2012-05-03 NOTE — ED Notes (Signed)
As per EMS , pt reports she tripped on a rug, falling on left side. No LOC,N/V or neck and back pain.

## 2012-05-03 NOTE — ED Notes (Signed)
Bed:WA18<BR> Expected date:<BR> Expected time:<BR> Means of arrival:<BR> Comments:<BR> EMS

## 2012-05-04 ENCOUNTER — Encounter (HOSPITAL_COMMUNITY): Payer: Self-pay | Admitting: Anesthesiology

## 2012-05-04 ENCOUNTER — Inpatient Hospital Stay (HOSPITAL_COMMUNITY): Payer: Medicare Other

## 2012-05-04 ENCOUNTER — Inpatient Hospital Stay (HOSPITAL_COMMUNITY): Payer: Medicare Other | Admitting: Anesthesiology

## 2012-05-04 ENCOUNTER — Encounter (HOSPITAL_COMMUNITY): Payer: Self-pay | Admitting: Internal Medicine

## 2012-05-04 ENCOUNTER — Encounter (HOSPITAL_COMMUNITY)
Admission: EM | Disposition: A | Discharge status: Skilled Nursing Facility | Payer: Self-pay | Source: Home / Self Care | Attending: Internal Medicine

## 2012-05-04 DIAGNOSIS — D649 Anemia, unspecified: Secondary | ICD-10-CM | POA: Diagnosis present

## 2012-05-04 DIAGNOSIS — E871 Hypo-osmolality and hyponatremia: Secondary | ICD-10-CM | POA: Diagnosis present

## 2012-05-04 DIAGNOSIS — S52599A Other fractures of lower end of unspecified radius, initial encounter for closed fracture: Secondary | ICD-10-CM

## 2012-05-04 DIAGNOSIS — S52202A Unspecified fracture of shaft of left ulna, initial encounter for closed fracture: Secondary | ICD-10-CM

## 2012-05-04 DIAGNOSIS — I10 Essential (primary) hypertension: Secondary | ICD-10-CM

## 2012-05-04 DIAGNOSIS — S72009A Fracture of unspecified part of neck of unspecified femur, initial encounter for closed fracture: Secondary | ICD-10-CM

## 2012-05-04 DIAGNOSIS — W010XXA Fall on same level from slipping, tripping and stumbling without subsequent striking against object, initial encounter: Secondary | ICD-10-CM

## 2012-05-04 DIAGNOSIS — S72002A Fracture of unspecified part of neck of left femur, initial encounter for closed fracture: Secondary | ICD-10-CM

## 2012-05-04 DIAGNOSIS — E039 Hypothyroidism, unspecified: Secondary | ICD-10-CM

## 2012-05-04 HISTORY — PX: CLOSED REDUCTION WRIST FRACTURE: SHX1091

## 2012-05-04 HISTORY — PX: HIP ARTHROPLASTY: SHX981

## 2012-05-04 LAB — URINALYSIS, MICROSCOPIC ONLY
Bilirubin Urine: NEGATIVE
Glucose, UA: NEGATIVE mg/dL
Ketones, ur: NEGATIVE mg/dL
Nitrite: NEGATIVE
Specific Gravity, Urine: 1.011 (ref 1.005–1.030)
pH: 7.5 (ref 5.0–8.0)

## 2012-05-04 LAB — CBC WITH DIFFERENTIAL/PLATELET
Basophils Absolute: 0 10*3/uL (ref 0.0–0.1)
Eosinophils Relative: 1 % (ref 0–5)
Lymphocytes Relative: 27 % (ref 12–46)
Lymphs Abs: 2.1 10*3/uL (ref 0.7–4.0)
MCV: 84.9 fL (ref 78.0–100.0)
Neutro Abs: 5.2 10*3/uL (ref 1.7–7.7)
Neutrophils Relative %: 65 % (ref 43–77)
Platelets: 244 10*3/uL (ref 150–400)
RBC: 4.05 MIL/uL (ref 3.87–5.11)
RDW: 13.4 % (ref 11.5–15.5)
WBC: 7.9 10*3/uL (ref 4.0–10.5)

## 2012-05-04 LAB — BASIC METABOLIC PANEL
CO2: 24 mEq/L (ref 19–32)
CO2: 27 mEq/L (ref 19–32)
Calcium: 9 mg/dL (ref 8.4–10.5)
Chloride: 98 mEq/L (ref 96–112)
Creatinine, Ser: 0.67 mg/dL (ref 0.50–1.10)
Creatinine, Ser: 0.8 mg/dL (ref 0.50–1.10)
GFR calc Af Amer: 78 mL/min — ABNORMAL LOW (ref >90)
Glucose, Bld: 139 mg/dL — ABNORMAL HIGH (ref 70–99)
Sodium: 132 mEq/L — ABNORMAL LOW (ref 135–145)

## 2012-05-04 LAB — CBC
HCT: 33 % — ABNORMAL LOW (ref 36.0–46.0)
MCV: 84.4 fL (ref 78.0–100.0)
RBC: 3.91 MIL/uL (ref 3.87–5.11)
RDW: 13.4 % (ref 11.5–15.5)
WBC: 15.6 10*3/uL — ABNORMAL HIGH (ref 4.0–10.5)

## 2012-05-04 LAB — TYPE AND SCREEN
ABO/RH(D): O POS
Antibody Screen: POSITIVE
DAT, IgG: NEGATIVE

## 2012-05-04 LAB — PROTIME-INR
INR: 1 (ref 0.00–1.49)
Prothrombin Time: 13.1 seconds (ref 11.6–15.2)

## 2012-05-04 LAB — IRON AND TIBC
Iron: 51 ug/dL (ref 42–135)
Saturation Ratios: 20 % (ref 20–55)
UIBC: 201 ug/dL (ref 125–400)

## 2012-05-04 LAB — FOLATE: Folate: >20 ng/mL

## 2012-05-04 LAB — RETICULOCYTES: Retic Count, Absolute: 39.7 10*3/uL (ref 19.0–186.0)

## 2012-05-04 LAB — ABO/RH: ABO/RH(D): O POS

## 2012-05-04 SURGERY — HEMIARTHROPLASTY, HIP, DIRECT ANTERIOR APPROACH, FOR FRACTURE
Anesthesia: General | Site: Wrist | Laterality: Left | Wound class: Clean

## 2012-05-04 MED ORDER — CEFAZOLIN SODIUM-DEXTROSE 2-3 GM-% IV SOLR
INTRAVENOUS | Status: AC
  Filled 2012-05-04: qty 50

## 2012-05-04 MED ORDER — HYDROMORPHONE HCL PF 1 MG/ML IJ SOLN
0.5000 mg | INTRAMUSCULAR | Status: DC | PRN
  Administered 2012-05-04: 1 mg via INTRAVENOUS
  Filled 2012-05-04 (×2): qty 1

## 2012-05-04 MED ORDER — ALBUTEROL SULFATE HFA 108 (90 BASE) MCG/ACT IN AERS
2.0000 | INHALATION_SPRAY | RESPIRATORY_TRACT | Status: AC
  Filled 2012-05-04: qty 6.7

## 2012-05-04 MED ORDER — ACETAMINOPHEN 650 MG RE SUPP: 650.0000 mg | Freq: Four times a day (QID) | RECTAL | Status: DC | PRN

## 2012-05-04 MED ORDER — EPHEDRINE SULFATE 50 MG/ML IJ SOLN
INTRAMUSCULAR | Status: DC | PRN
  Administered 2012-05-04: 10 mg via INTRAVENOUS

## 2012-05-04 MED ORDER — ONDANSETRON HCL 4 MG/2ML IJ SOLN
4.0000 mg | Freq: Four times a day (QID) | INTRAMUSCULAR | Status: DC | PRN
  Administered 2012-05-04: 4 mg via INTRAVENOUS
  Filled 2012-05-04: qty 2

## 2012-05-04 MED ORDER — PATIENT'S GUIDE TO USING COUMADIN BOOK
Freq: Once | Status: AC
  Administered 2012-05-04: 21:00:00
  Filled 2012-05-04: qty 1

## 2012-05-04 MED ORDER — ENOXAPARIN SODIUM 40 MG/0.4ML ~~LOC~~ SOLN
40.0000 mg | SUBCUTANEOUS | Status: DC
  Administered 2012-05-05 – 2012-05-07 (×3): 40 mg via SUBCUTANEOUS
  Filled 2012-05-04 (×4): qty 0.4

## 2012-05-04 MED ORDER — FENTANYL CITRATE 0.05 MG/ML IJ SOLN: 25.0000 ug | INTRAMUSCULAR | Status: DC | PRN

## 2012-05-04 MED ORDER — SODIUM CHLORIDE 0.9 % IV SOLN
INTRAVENOUS | Status: DC
  Administered 2012-05-04 – 2012-05-05 (×3): via INTRAVENOUS

## 2012-05-04 MED ORDER — 0.9 % SODIUM CHLORIDE (POUR BTL) OPTIME
TOPICAL | Status: DC | PRN
  Administered 2012-05-04: 1000 mL

## 2012-05-04 MED ORDER — MIDAZOLAM HCL 5 MG/5ML IJ SOLN
INTRAMUSCULAR | Status: DC | PRN
  Administered 2012-05-04: 0.5 mg via INTRAVENOUS

## 2012-05-04 MED ORDER — PHENOL 1.4 % MT LIQD: 1.0000 | OROMUCOSAL | Status: DC | PRN

## 2012-05-04 MED ORDER — LOSARTAN POTASSIUM 50 MG PO TABS
50.0000 mg | ORAL_TABLET | Freq: Every morning | ORAL | Status: DC
  Administered 2012-05-05 – 2012-05-07 (×3): 50 mg via ORAL
  Filled 2012-05-04 (×4): qty 1

## 2012-05-04 MED ORDER — ONDANSETRON HCL 4 MG PO TABS: 4.0000 mg | ORAL_TABLET | Freq: Four times a day (QID) | ORAL | Status: DC | PRN

## 2012-05-04 MED ORDER — ONDANSETRON HCL 4 MG/2ML IJ SOLN: 4.0000 mg | Freq: Three times a day (TID) | INTRAMUSCULAR | Status: AC | PRN

## 2012-05-04 MED ORDER — HYDROMORPHONE HCL PF 1 MG/ML IJ SOLN
1.0000 mg | Freq: Once | INTRAMUSCULAR | Status: AC
  Administered 2012-05-04: 1 mg via INTRAVENOUS
  Filled 2012-05-04: qty 1

## 2012-05-04 MED ORDER — METOPROLOL TARTRATE 50 MG PO TABS
50.0000 mg | ORAL_TABLET | Freq: Every morning | ORAL | Status: DC
  Filled 2012-05-04: qty 2

## 2012-05-04 MED ORDER — CLINDAMYCIN PHOSPHATE 600 MG/50ML IV SOLN
600.0000 mg | Freq: Four times a day (QID) | INTRAVENOUS | Status: AC
  Administered 2012-05-04 (×2): 600 mg via INTRAVENOUS
  Filled 2012-05-04 (×2): qty 50

## 2012-05-04 MED ORDER — WARFARIN - PHARMACIST DOSING INPATIENT: Freq: Every day | Status: DC

## 2012-05-04 MED ORDER — ACETAMINOPHEN 10 MG/ML IV SOLN
INTRAVENOUS | Status: AC
  Filled 2012-05-04: qty 100

## 2012-05-04 MED ORDER — WARFARIN VIDEO
Freq: Once | Status: AC
  Administered 2012-05-05: 12:00:00

## 2012-05-04 MED ORDER — WARFARIN SODIUM 4 MG PO TABS
4.0000 mg | ORAL_TABLET | Freq: Once | ORAL | Status: AC
  Administered 2012-05-04: 4 mg via ORAL
  Filled 2012-05-04: qty 1

## 2012-05-04 MED ORDER — FENTANYL CITRATE 0.05 MG/ML IJ SOLN
INTRAMUSCULAR | Status: DC | PRN
  Administered 2012-05-04: 100 ug via INTRAVENOUS
  Administered 2012-05-04 (×3): 50 ug via INTRAVENOUS

## 2012-05-04 MED ORDER — LEVOTHYROXINE SODIUM 125 MCG PO TABS
62.5000 ug | ORAL_TABLET | Freq: Every day | ORAL | Status: DC
  Administered 2012-05-04 – 2012-05-06 (×3): 62.5 ug via ORAL
  Filled 2012-05-04 (×6): qty 0.5

## 2012-05-04 MED ORDER — CEFAZOLIN SODIUM-DEXTROSE 2-3 GM-% IV SOLR
INTRAVENOUS | Status: DC | PRN
  Administered 2012-05-04: 2 g via INTRAVENOUS

## 2012-05-04 MED ORDER — HYDROMORPHONE HCL PF 1 MG/ML IJ SOLN
1.0000 mg | INTRAMUSCULAR | Status: DC | PRN
  Administered 2012-05-04 – 2012-05-06 (×3): 1 mg via INTRAVENOUS
  Filled 2012-05-04 (×3): qty 1

## 2012-05-04 MED ORDER — ONDANSETRON HCL 4 MG/2ML IJ SOLN
INTRAMUSCULAR | Status: DC | PRN
  Administered 2012-05-04: 4 mg via INTRAVENOUS

## 2012-05-04 MED ORDER — OXYCODONE HCL 5 MG PO TABS
5.0000 mg | ORAL_TABLET | ORAL | Status: DC | PRN
  Administered 2012-05-05 – 2012-05-07 (×7): 5 mg via ORAL
  Filled 2012-05-04 (×7): qty 1

## 2012-05-04 MED ORDER — ONDANSETRON HCL 4 MG/2ML IJ SOLN: 4.0000 mg | Freq: Four times a day (QID) | INTRAMUSCULAR | Status: DC | PRN

## 2012-05-04 MED ORDER — ACETAMINOPHEN 325 MG PO TABS
650.0000 mg | ORAL_TABLET | Freq: Four times a day (QID) | ORAL | Status: DC | PRN
  Administered 2012-05-07: 650 mg via ORAL
  Filled 2012-05-04: qty 2

## 2012-05-04 MED ORDER — PROPOFOL 10 MG/ML IV BOLUS
INTRAVENOUS | Status: DC | PRN
  Administered 2012-05-04: 100 mg via INTRAVENOUS

## 2012-05-04 MED ORDER — FERROUS SULFATE 325 (65 FE) MG PO TABS
325.0000 mg | ORAL_TABLET | Freq: Three times a day (TID) | ORAL | Status: DC
  Administered 2012-05-04 – 2012-05-07 (×10): 325 mg via ORAL
  Filled 2012-05-04 (×11): qty 1

## 2012-05-04 MED ORDER — CIPROFLOXACIN IN D5W 400 MG/200ML IV SOLN
400.0000 mg | Freq: Two times a day (BID) | INTRAVENOUS | Status: DC
  Administered 2012-05-04 – 2012-05-06 (×4): 400 mg via INTRAVENOUS
  Filled 2012-05-04 (×5): qty 200

## 2012-05-04 MED ORDER — MENTHOL 3 MG MT LOZG: 1.0000 | LOZENGE | OROMUCOSAL | Status: DC | PRN

## 2012-05-04 MED ORDER — ACETAMINOPHEN 10 MG/ML IV SOLN: 1000.0000 mg | Freq: Once | INTRAVENOUS | Status: DC | PRN

## 2012-05-04 MED ORDER — PHENYLEPHRINE HCL 10 MG/ML IJ SOLN
INTRAMUSCULAR | Status: DC | PRN
  Administered 2012-05-04: 160 ug via INTRAVENOUS
  Administered 2012-05-04 (×3): 80 ug via INTRAVENOUS

## 2012-05-04 MED ORDER — METOCLOPRAMIDE HCL 5 MG/ML IJ SOLN: 5.0000 mg | Freq: Three times a day (TID) | INTRAMUSCULAR | Status: DC | PRN

## 2012-05-04 MED ORDER — ACETAMINOPHEN 10 MG/ML IV SOLN
INTRAVENOUS | Status: DC | PRN
  Administered 2012-05-04: 1000 mg via INTRAVENOUS

## 2012-05-04 MED ORDER — LACTATED RINGERS IV SOLN
INTRAVENOUS | Status: DC | PRN
  Administered 2012-05-04 (×2): via INTRAVENOUS

## 2012-05-04 MED ORDER — METOCLOPRAMIDE HCL 5 MG PO TABS
5.0000 mg | ORAL_TABLET | Freq: Three times a day (TID) | ORAL | Status: DC | PRN
  Filled 2012-05-04: qty 2

## 2012-05-04 MED ORDER — AMLODIPINE BESYLATE 2.5 MG PO TABS
2.5000 mg | ORAL_TABLET | Freq: Every morning | ORAL | Status: DC
  Administered 2012-05-05 – 2012-05-07 (×3): 2.5 mg via ORAL
  Filled 2012-05-04 (×4): qty 1

## 2012-05-04 MED ORDER — METOPROLOL TARTRATE 1 MG/ML IV SOLN
2.5000 mg | Freq: Four times a day (QID) | INTRAVENOUS | Status: DC
  Administered 2012-05-04 – 2012-05-05 (×5): 2.5 mg via INTRAVENOUS
  Filled 2012-05-04 (×8): qty 5

## 2012-05-04 MED ORDER — SUCCINYLCHOLINE CHLORIDE 20 MG/ML IJ SOLN
INTRAMUSCULAR | Status: DC | PRN
  Administered 2012-05-04: 80 mg via INTRAVENOUS

## 2012-05-04 MED ORDER — ALBUTEROL SULFATE HFA 108 (90 BASE) MCG/ACT IN AERS
INHALATION_SPRAY | RESPIRATORY_TRACT | Status: AC
  Filled 2012-05-04: qty 6.7

## 2012-05-04 MED ORDER — LIDOCAINE HCL (CARDIAC) 20 MG/ML IV SOLN
INTRAVENOUS | Status: DC | PRN
  Administered 2012-05-04: 60 mg via INTRAVENOUS

## 2012-05-04 MED ORDER — HYDROCODONE-ACETAMINOPHEN 5-325 MG PO TABS
1.0000 | ORAL_TABLET | ORAL | Status: AC | PRN
  Administered 2012-05-04: 2 via ORAL
  Filled 2012-05-04: qty 2

## 2012-05-04 MED ORDER — MORPHINE SULFATE 2 MG/ML IJ SOLN
0.5000 mg | INTRAMUSCULAR | Status: DC | PRN
  Administered 2012-05-04 (×3): 0.5 mg via INTRAVENOUS
  Filled 2012-05-04 (×3): qty 1

## 2012-05-04 MED ORDER — METHOCARBAMOL 100 MG/ML IJ SOLN
500.0000 mg | Freq: Four times a day (QID) | INTRAVENOUS | Status: DC | PRN
  Filled 2012-05-04: qty 5

## 2012-05-04 MED ORDER — HYDROMORPHONE HCL PF 1 MG/ML IJ SOLN
INTRAMUSCULAR | Status: DC | PRN
  Administered 2012-05-04 (×2): 0.5 mg via INTRAVENOUS
  Administered 2012-05-04: 1 mg via INTRAVENOUS

## 2012-05-04 MED ORDER — HYDRALAZINE HCL 20 MG/ML IJ SOLN
5.0000 mg | Freq: Four times a day (QID) | INTRAMUSCULAR | Status: DC | PRN
  Administered 2012-05-04: 5 mg via INTRAVENOUS
  Filled 2012-05-04: qty 0.25

## 2012-05-04 SURGICAL SUPPLY — 54 items
ADH SKN CLS APL DERMABOND .7 (GAUZE/BANDAGES/DRESSINGS) ×2
BAG SPEC THK2 15X12 ZIP CLS (MISCELLANEOUS) ×2
BAG ZIPLOCK 12X15 (MISCELLANEOUS) ×3 IMPLANT
BANDAGE ELASTIC 3 VELCRO ST LF (GAUZE/BANDAGES/DRESSINGS) ×1 IMPLANT
BANDAGE ELASTIC 4 VELCRO ST LF (GAUZE/BANDAGES/DRESSINGS) ×1 IMPLANT
BLADE SAW SGTL 18X1.27X75 (BLADE) ×3 IMPLANT
BNDG PLASTER X FAST 4X5 WHT LF (CAST SUPPLIES) ×3 IMPLANT
BNDG PLSTR 5X4 XFST ST WHT LF (CAST SUPPLIES) ×6
CLOTH BEACON ORANGE TIMEOUT ST (SAFETY) ×3 IMPLANT
DERMABOND ADVANCED (GAUZE/BANDAGES/DRESSINGS) ×1
DERMABOND ADVANCED .7 DNX12 (GAUZE/BANDAGES/DRESSINGS) ×2 IMPLANT
DRAPE INCISE IOBAN 85X60 (DRAPES) ×3 IMPLANT
DRAPE ORTHO SPLIT 77X108 STRL (DRAPES) ×6
DRAPE POUCH INSTRU U-SHP 10X18 (DRAPES) ×3 IMPLANT
DRAPE SURG 17X11 SM STRL (DRAPES) ×3 IMPLANT
DRAPE SURG ORHT 6 SPLT 77X108 (DRAPES) ×4 IMPLANT
DRAPE U-SHAPE 47X51 STRL (DRAPES) ×3 IMPLANT
DRSG AQUACEL AG ADV 3.5X10 (GAUZE/BANDAGES/DRESSINGS) ×3 IMPLANT
DRSG TEGADERM 4X4.75 (GAUZE/BANDAGES/DRESSINGS) ×3 IMPLANT
DURAPREP 26ML APPLICATOR (WOUND CARE) ×3 IMPLANT
ELECT BLADE TIP CTD 4 INCH (ELECTRODE) ×3 IMPLANT
ELECT REM PT RETURN 9FT ADLT (ELECTROSURGICAL) ×3
ELECTRODE REM PT RTRN 9FT ADLT (ELECTROSURGICAL) ×2 IMPLANT
EVACUATOR 1/8 PVC DRAIN (DRAIN) ×3 IMPLANT
FACESHIELD LNG OPTICON STERILE (SAFETY) ×12 IMPLANT
GAUZE SPONGE 2X2 8PLY STRL LF (GAUZE/BANDAGES/DRESSINGS) ×2 IMPLANT
GLOVE BIOGEL PI IND STRL 7.5 (GLOVE) ×2 IMPLANT
GLOVE BIOGEL PI IND STRL 8 (GLOVE) ×2 IMPLANT
GLOVE BIOGEL PI INDICATOR 7.5 (GLOVE) ×1
GLOVE BIOGEL PI INDICATOR 8 (GLOVE) ×1
GLOVE ECLIPSE 8.0 STRL XLNG CF (GLOVE) IMPLANT
GLOVE ORTHO TXT STRL SZ7.5 (GLOVE) ×6 IMPLANT
GLOVE SURG ORTHO 8.0 STRL STRW (GLOVE) ×3 IMPLANT
GOWN BRE IMP PREV XXLGXLNG (GOWN DISPOSABLE) ×6 IMPLANT
GOWN STRL NON-REIN LRG LVL3 (GOWN DISPOSABLE) ×3 IMPLANT
HANDPIECE INTERPULSE COAX TIP (DISPOSABLE)
IMMOBILIZER KNEE 20 (SOFTGOODS)
IMMOBILIZER KNEE 20 THIGH 36 (SOFTGOODS) IMPLANT
KIT BASIN OR (CUSTOM PROCEDURE TRAY) ×3 IMPLANT
MANIFOLD NEPTUNE II (INSTRUMENTS) ×3 IMPLANT
PACK TOTAL JOINT (CUSTOM PROCEDURE TRAY) ×3 IMPLANT
PADDING CAST ABS 4INX4YD NS (CAST SUPPLIES) ×4
PADDING CAST ABS COTTON 4X4 ST (CAST SUPPLIES) IMPLANT
POSITIONER SURGICAL ARM (MISCELLANEOUS) ×3 IMPLANT
SET HNDPC FAN SPRY TIP SCT (DISPOSABLE) IMPLANT
SPONGE GAUZE 2X2 STER 10/PKG (GAUZE/BANDAGES/DRESSINGS) ×1
STRIP CLOSURE SKIN 1/2X4 (GAUZE/BANDAGES/DRESSINGS) ×6 IMPLANT
SUT ETHIBOND NAB CT1 #1 30IN (SUTURE) ×3 IMPLANT
SUT MNCRL AB 4-0 PS2 18 (SUTURE) ×3 IMPLANT
SUT VIC AB 1 CT1 36 (SUTURE) ×6 IMPLANT
SUT VIC AB 2-0 CT1 27 (SUTURE) ×6
SUT VIC AB 2-0 CT1 TAPERPNT 27 (SUTURE) ×4 IMPLANT
TOWEL OR 17X26 10 PK STRL BLUE (TOWEL DISPOSABLE) ×6 IMPLANT
TRAY FOLEY CATH 14FRSI W/METER (CATHETERS) ×2 IMPLANT

## 2012-05-04 NOTE — ED Notes (Signed)
Patient transported to X-ray and returned 

## 2012-05-04 NOTE — Progress Notes (Signed)
ANTICOAGULATION CONSULT NOTE - Initial Consult  Pharmacy Consult for Warfarin Indication: VTE prophylaxis  Allergies  Allergen Reactions  . Cefuroxime Axetil Hives  . Lactose Intolerance (Gi) Other (See Comments)    Gi upset  . Prednisone Other (See Comments)    Pt unsure of reaction  . Clarithromycin Palpitations  . Codeine Palpitations  . Sulfonamide Derivatives Palpitations    Patient Measurements: Height: 5\' 7"  (170.2 cm) Weight: 135 lb 3.2 oz (61.326 kg) IBW/kg (Calculated) : 61.6  Vital Signs: Temp: 97.1 F (36.2 C) (04/20 1802) Temp src: Oral (04/20 1409) BP: 138/49 mmHg (04/20 1802) Pulse Rate: 73 (04/20 1802)  Labs:  Recent Labs  05/04/12 0013 05/04/12 0552  HGB 11.9* 11.4*  HCT 34.4* 33.0*  PLT 244 247  LABPROT 13.1  --   INR 1.00  --   CREATININE 0.80 0.67    Estimated Creatinine Clearance: 53.4 ml/min (by C-G formula based on Cr of 0.67).   Medical History: Past Medical History  Diagnosis Date  . IBS (irritable bowel syndrome)   . Hypothyroid   . GERD (gastroesophageal reflux disease)   . Hypertension   . Allergic rhinitis   . Asthma     Medications:  Scheduled:  . amLODipine  2.5 mg Oral q morning - 10a  . ciprofloxacin  400 mg Intravenous Q12H  . clindamycin (CLEOCIN) IV  600 mg Intravenous Q6H  . [START ON 05/05/2012] enoxaparin (LOVENOX) injection  40 mg Subcutaneous Q24H  . ferrous sulfate  325 mg Oral TID PC  . [COMPLETED]  HYDROmorphone (DILAUDID) injection  1 mg Intravenous Once  . levothyroxine  62.5 mcg Oral QHS  . losartan  50 mg Oral q morning - 10a  . metoprolol  2.5 mg Intravenous Q6H  . [DISCONTINUED] metoprolol  50 mg Oral q morning - 10a   Infusions:  . sodium chloride 75 mL/hr at 05/04/12 1745    Assessment: 79 yo female s/p fall. Now s/p left hip hemiarthroplasty and left wrist closed reduction.  To be warfarin for post-op VTE prophylaxis.  Baseline coags within normal limits  CBC ok  Potential drug  interaction with cipro (mild)  Note aspirin 81mg  day PTA  Goal of Therapy:  INR 2-3 Monitor platelets by anticoagulation protocol: Yes   Plan:   Begin warfarin 4mg  po x 1 today  Daily PT/INR  Lovenox 40mg  sq q24h until INR >/= 1.8 per MD  Provide warfarin education prior to discharge  Loralee Pacas, PharmD, BCPS Pager: [phone] 05/04/2012,6:23 PM

## 2012-05-04 NOTE — Interval H&P Note (Signed)
History and Physical Interval Note:  05/04/2012 3:19 PM  Joanna [name]  has presented today for surgery, with the diagnosis of left femoral neck fracture and wrist fracture  The various methods of treatment have been discussed with the patient and family. After consideration of risks, benefits and other options for treatment, the patient has consented to  Procedure(s): ARTHROPLASTY BIPOLAR HIP (Left) CLOSED REDUCTION WRIST (Left) as a surgical intervention .  The patient's history has been reviewed, patient examined, no change in status, stable for surgery.  I have reviewed the patient's chart and labs.  Questions were answered to the patient's satisfaction.     Shelda Pal

## 2012-05-04 NOTE — H&P (Signed)
[facility] History and Physical  Joanna [name] XBM:[id number] DOB: September 27, 1928 DOA: 05/03/2012  Referring physician: EDP PCP: Reather Littler, MD  Specialists:   Chief Complaint: Left Hip and Left Wrist Pain after Falling  HPI: Joanna [name] is a 79 y.o. female who presented to the ED after suffering a fall in her home.  She reports slipping on the floor and falling onto her left hip and outstretched hand.   She had increased pain afterward.  In the ED, she was found to have fractures of the Left Femoral neck, and the left distal Radius and Ulna.  Dr. Despina Hick of Orthopedics was consulted to see patient in the AM.      Review of Systems: The patient denies anorexia, fever, chills, headaches, weight loss, vision loss, decreased hearing, hoarseness, chest pain, syncope, dyspnea on exertion, peripheral edema, balance deficits, hemoptysis, abdominal pain, nausea, vomiting, diarrhea, hematemesis,  melena, hematochezia, severe indigestion/heartburn,dysuria,  hematuria, incontinence, muscle weakness, suspicious skin lesions, transient blindness, depression, unusual weight change, abnormal bleeding, enlarged lymph nodes, angioedema, and breast masses.    Past Medical History  Diagnosis Date  . IBS (irritable bowel syndrome)   . Hypothyroid   . GERD (gastroesophageal reflux disease)   . Hypertension   . Allergic rhinitis   . Asthma     Past Surgical History  Procedure Laterality Date  . Mastectomy      double  . Bladder surgery    . Nasal sinus surgery      Medications:  HOME MEDS: Prior to Admission medications   Medication Sig Start Date End Date Taking? Authorizing Provider  amLODipine (NORVASC) 5 MG tablet Take 2.5 mg by mouth every evening.   Yes Historical Provider, MD  aspirin 81 MG tablet Take 81 mg by mouth every other day.    Yes Historical Provider, MD  b complex vitamins tablet Take 1 tablet by mouth every morning.   Yes Historical Provider, MD  budesonide-formoterol  (SYMBICORT) 160-4.5 MCG/ACT inhaler Inhale 2 puffs into the lungs 2 (two) times daily.     Yes Historical Provider, MD  calcium carbonate (OS-CAL - DOSED IN MG OF ELEMENTAL CALCIUM) 1250 MG tablet Take 1 tablet by mouth every morning.   Yes Historical Provider, MD  cholecalciferol (VITAMIN D) 1000 UNITS tablet Take 1,000 Units by mouth daily.   Yes Historical Provider, MD  fish oil-omega-3 fatty acids 1000 MG capsule Take 1 g by mouth every morning.   Yes Historical Provider, MD  flurbiprofen (ANSAID) 100 MG tablet Take 100 mg by mouth daily as needed (pain).  10/23/10  Yes Historical Provider, MD  gabapentin (NEURONTIN) 300 MG capsule Take 300-600 mg by mouth daily as needed (pain).    Yes Historical Provider, MD  glucosamine-chondroitin 500-400 MG tablet Take 1 tablet by mouth 3 (three) times a week.   Yes Historical Provider, MD  levothyroxine (SYNTHROID, LEVOTHROID) 125 MCG tablet Take 62.5 mcg by mouth at bedtime.    Yes Historical Provider, MD  losartan (COZAAR) 50 MG tablet Take 50 mg by mouth every evening.   Yes Historical Provider, MD  metoprolol (LOPRESSOR) 50 MG tablet Take 50 mg by mouth every evening.   Yes Historical Provider, MD  Multiple Vitamin (MULTIVITAMIN WITH MINERALS) TABS Take 1 tablet by mouth every morning.   Yes Historical Provider, MD  omalizumab Geoffry Paradise) 150 MG injection Inject 150 mg into the skin every 28 (twenty-eight) days. Administered by Dr. Shelle Iron [phone]   Yes Historical Provider, MD  omeprazole (PRILOSEC)  20 MG capsule Take 20 mg by mouth every morning.   Yes Historical Provider, MD  polycarbophil (FIBERCON) 625 MG tablet Take 625 mg by mouth 2 (two) times daily.   Yes Historical Provider, MD  simvastatin (ZOCOR) 20 MG tablet Take 20 mg by mouth every evening.   Yes Historical Provider, MD  vitamin C (ASCORBIC ACID) 500 MG tablet Take 500 mg by mouth daily.   Yes Historical Provider, MD  albuterol (PROAIR HFA) 108 (90 BASE) MCG/ACT inhaler Inhale 2 puffs  into the lungs every 6 (six) hours as needed for wheezing or shortness of breath.     Historical Provider, MD    Allergies:  Allergies  Allergen Reactions  . Cefuroxime Axetil Hives  . Lactose Intolerance (Gi) Other (See Comments)    Gi upset  . Prednisone Other (See Comments)    Pt unsure of reaction  . Clarithromycin Palpitations  . Codeine Palpitations  . Sulfonamide Derivatives Palpitations      Social History: Lives Alone  reports that she has never smoked. She has never used smokeless tobacco. She reports that she does not drink alcohol or use illicit drugs.    Family History: Family History  Problem Relation Age of Onset  . CAD Mother   . Hypertension Father   . Breast cancer Sister      Physical Exam:  GEN:  Pleasant Elderly 79 year old Well nourished and well developed Caucasian Female examined  and in no acute distress; cooperative with exam Filed Vitals:   05/03/12 2327 05/03/12 2338 05/04/12 0230  BP:  173/76 174/77  Pulse:  83 76  Temp:  97.9 F (36.6 C)   TempSrc:  Oral   Resp:  15   Height:  5\' 7"  (1.702 m)   SpO2: 90% 95% 99%   Blood pressure 174/77, pulse 76, temperature 97.9 F (36.6 C), temperature source Oral, resp. rate 15, height 5\' 7"  (1.702 m), SpO2 99.00%. PSYCH: She is alert and oriented x4; does not appear anxious does not appear depressed; affect is normal HEENT: Normocephalic and Atraumatic, Mucous membranes pink; PERRLA; EOM intact; Fundi:  Benign;  No scleral icterus, Nares: Patent, Oropharynx: Fair Dentition, Neck:  FROM, no cervical lymphadenopathy nor thyromegaly or carotid bruit; no JVD; Breasts:: Not examined CHEST WALL: No tenderness CHEST: Normal respiration, clear to auscultation bilaterally HEART: Regular rate and rhythm; no murmurs rubs or gallops BACK: No kyphosis or scoliosis; no CVA tenderness ABDOMEN: Positive Bowel Sounds, soft non-tender; no masses, no organomegaly.   Rectal Exam: Not done EXTREMITIES: No  cyanosis, clubbing or edema; no ulcerations. Genitalia: not examined PULSES: 2+ and symmetric SKIN: Normal hydration no rash or ulceration CNS: Cranial nerves 2-12 grossly intact no focal neurologic deficit    Labs & Imaging Results for orders placed during the hospital encounter of 05/03/12 (from the past 48 hour(s))  URINALYSIS, MICROSCOPIC ONLY     Status: Abnormal   Collection Time    05/04/12 12:10 AM      Result Value Range   Color, Urine YELLOW  YELLOW   APPearance CLEAR  CLEAR   Specific Gravity, Urine 1.011  1.005 - 1.030   pH 7.5  5.0 - 8.0   Glucose, UA NEGATIVE  NEGATIVE mg/dL   Hgb urine dipstick NEGATIVE  NEGATIVE   Bilirubin Urine NEGATIVE  NEGATIVE   Ketones, ur NEGATIVE  NEGATIVE mg/dL   Protein, ur NEGATIVE  NEGATIVE mg/dL   Urobilinogen, UA 0.2  0.0 - 1.0 mg/dL   Nitrite  NEGATIVE  NEGATIVE   Leukocytes, UA LARGE (*) NEGATIVE   WBC, UA 7-10  <3 WBC/hpf   Comment: WITH CLUMPS  BASIC METABOLIC PANEL     Status: Abnormal   Collection Time    05/04/12 12:13 AM      Result Value Range   Sodium 132 (*) 135 - 145 mEq/L   Potassium 3.9  3.5 - 5.1 mEq/L   Chloride 96  96 - 112 mEq/L   CO2 27  19 - 32 mEq/L   Glucose, Bld 120 (*) 70 - 99 mg/dL   BUN 18  6 - 23 mg/dL   Creatinine, Ser 1.61  0.50 - 1.10 mg/dL   Calcium 9.0  8.4 - 09.6 mg/dL   GFR calc non Af Amer 67 (*) >90 mL/min   GFR calc Af Amer 78 (*) >90 mL/min   Comment:            The eGFR has been calculated     using the CKD EPI equation.     This calculation has not been     validated in all clinical     situations.     eGFR's persistently     <90 mL/min signify     possible Chronic Kidney Disease.  CBC WITH DIFFERENTIAL     Status: Abnormal   Collection Time    05/04/12 12:13 AM      Result Value Range   WBC 7.9  4.0 - 10.5 K/uL   RBC 4.05  3.87 - 5.11 MIL/uL   Hemoglobin 11.9 (*) 12.0 - 15.0 g/dL   HCT 04.5 (*) 40.9 - 81.1 %   MCV 84.9  78.0 - 100.0 fL   MCH 29.4  26.0 - 34.0 pg   MCHC  34.6  30.0 - 36.0 g/dL   RDW 91.4  78.2 - 95.6 %   Platelets 244  150 - 400 K/uL   Neutrophils Relative 65  43 - 77 %   Neutro Abs 5.2  1.7 - 7.7 K/uL   Lymphocytes Relative 27  12 - 46 %   Lymphs Abs 2.1  0.7 - 4.0 K/uL   Monocytes Relative 6  3 - 12 %   Monocytes Absolute 0.5  0.1 - 1.0 K/uL   Eosinophils Relative 1  0 - 5 %   Eosinophils Absolute 0.1  0.0 - 0.7 K/uL   Basophils Relative 0  0 - 1 %   Basophils Absolute 0.0  0.0 - 0.1 K/uL  PROTIME-INR     Status: None   Collection Time    05/04/12 12:13 AM      Result Value Range   Prothrombin Time 13.1  11.6 - 15.2 seconds   INR 1.00  0.00 - 1.49  TYPE AND SCREEN     Status: None   Collection Time    05/04/12 12:13 AM      Result Value Range   ABO/RH(D) O POS     Antibody Screen PENDING     Sample Expiration 05/07/2012      Radiological Exams on Admission: Dg Chest 1 View  05/04/2012  *RADIOLOGY REPORT*  Clinical Data: Status post fall; history of asthma.  Concern for chest injury.  CHEST - 1 VIEW  Comparison: Chest radiograph performed 02/23/2011, and CTA of the chest performed 03/27/2010  Findings: The lungs are well-aerated.  Mild chronically increased interstitial markings are seen.  Bilateral densities reflect overlying soft tissues.  There is no evidence of focal opacification, pleural effusion or  pneumothorax.  The cardiomediastinal silhouette is normal in size.  Calcification is noted within the aortic arch.  No acute osseous abnormalities are seen.  IMPRESSION: Mild chronic lung changes noted.  No acute cardiopulmonary process seen.  No displaced rib fractures identified.   Original Report Authenticated By: Tonia Ghent, M.D.    Dg Forearm Left  05/04/2012  *RADIOLOGY REPORT*  Clinical Data: Distal forearm pain and bruising post fall  LEFT FOREARM - 2 VIEW  Comparison: None  Findings: Minimally distracted ulnar styloid fracture. Transverse metaphyseal fracture distal left radius, minimally comminuted, with apex volar  angulation. No definite intrarticular extension. Diffuse osseous demineralization. No additional fracture, dislocation or bone destruction.  IMPRESSION: Displaced ulnar styloid fracture. Mildly displaced and angulated comminuted distal left radial metaphyseal fracture.   Original Report Authenticated By: Ulyses Southward, M.D.    Dg Hip Complete Left  05/04/2012  *RADIOLOGY REPORT*  Clinical Data: Hip pain.  LEFT HIP - COMPLETE 2+ VIEW  Comparison: None.  Findings: There appears to be a mildly displaced transcervical fracture of the left femoral neck, difficult to fully characterize. The left femoral head remains seated at the acetabulum.  The right hip joint is unremarkable in appearance.  Mild degenerative change is noted at the lower lumbar spine.  The sacroiliac joints are unremarkable in appearance.  The visualized bowel gas pattern is grossly unremarkable in appearance.  Scattered phleboliths are noted within the pelvis.  IMPRESSION: Mildly displaced transcervical fracture of the left femoral neck.   Original Report Authenticated By: Tonia Ghent, M.D.     EKG:  Normal Sinus Rhythm  No Acute S-T Changes.  .   Assessment/Plan Principal Problem:   Closed left hip fracture Active Problems:   Fracture of radial shaft, with ulna, left, closed   Fall due to stumbling   HYPOTHYROIDISM   HYPERTENSION   COPD   Anemia   Hyponatremia    1.  Left Hip Fracture and Fracture of distal radius and Ulna-  Orthopedics Dr. Despina Hick to see this AM.  NPO, PAinControl PRN  2.  Fall due to Stumbling- accidental.    3.  Hypothyroid- on Levothyroxine, check TSH.    4.  HTN-  IV Metoprolol, IV Hydralazine PRN.    5.  COPD- stable  Nebs PRN.  Monitor O2 sats.    6.  Anemia- send Anemia Panel, monitor trend.    7.  Hyponatremia-  IVFs with NSS, should correct, monitor Na+Levels.    8.  DVT prophylaxis with SCDs.      Code Status:  FULL CODE Family Communication:  No Family Present at this  Time Disposition Plan:  TBA  Time spent: 35 Minutes  Ron Parker [facility] Pager [phone]  If 7PM-7AM, please contact night-coverage [email or web address] Password [facility] 05/04/2012, 2:44 AM

## 2012-05-04 NOTE — Anesthesia Postprocedure Evaluation (Signed)
  Anesthesia Post-op Note  Anesthesia Post Note  Patient: Joanna [name]  Procedure(s) Performed: Procedure(s) (LRB): ARTHROPLASTY BIPOLAR HIP (Left) CLOSED REDUCTION WRIST (Left)  Anesthesia type: General  Patient location: PACU  Post pain: Pain level controlled  Post assessment: Post-op Vital signs reviewed  Last Vitals:  Filed Vitals:   05/04/12 1745  BP:   Pulse: 77  Temp:   Resp: 18    Post vital signs: Reviewed  Level of consciousness: sedated  Complications: No apparent anesthesia complications

## 2012-05-04 NOTE — ED Provider Notes (Signed)
History     CSN: [id number]  Arrival date & time 05/03/12  2327   First MD Initiated Contact with Patient 05/03/12 2328      Chief Complaint  Patient presents with  . Fall  . Wrist Pain  . Hip Pain    (Consider location/radiation/quality/duration/timing/severity/associated sxs/prior treatment) HPI 79 year old female presents to emergency room via EMS after fall.  Patient reports she slipped on some water and a loose rug in her kitchen.  She reports landing on her left side.  She did not strike her head, neck, or have LOC.  Patient is complaining of severe pain to her left wrist, and left hip.  She was unable to get up after falling.  Patient is right-hand dominant.  She reports she last ate at dinner around 7 pm.  She has past medical history of hypertension, COPD, irregular heartbeat, hypothyroidism.  She is followed by Dr. Lucianne Muss and Dr.Clance.   Past Medical History  Diagnosis Date  . IBS (irritable bowel syndrome)   . Hypothyroid   . GERD (gastroesophageal reflux disease)   . Hypertension   . Allergic rhinitis   . Asthma     Past Surgical History  Procedure Laterality Date  . Mastectomy      double  . Bladder surgery    . Nasal sinus surgery      No family history on file.  History  Substance Use Topics  . Smoking status: Never Smoker   . Smokeless tobacco: Never Used  . Alcohol Use: No    OB History   Grav Para Term Preterm Abortions TAB SAB Ect Mult Living                  Review of Systems  See History of Present Illness; otherwise all other systems are reviewed and negative Allergies  Caffeine; Cefuroxime axetil; Clarithromycin; Codeine; Prednisone; and Sulfonamide derivatives  Home Medications   Current Outpatient Rx  Name  Route  Sig  Dispense  Refill  . albuterol (PROAIR HFA) 108 (90 BASE) MCG/ACT inhaler   Inhalation   Inhale 2 puffs into the lungs every 6 (six) hours as needed for wheezing or shortness of breath.          Marland Kitchen amLODipine  (NORVASC) 5 MG tablet   Oral   Take 2.5 mg by mouth every morning.          Marland Kitchen aspirin 81 MG tablet   Oral   Take 81 mg by mouth every other day.          . budesonide-formoterol (SYMBICORT) 160-4.5 MCG/ACT inhaler   Inhalation   Inhale 2 puffs into the lungs 2 (two) times daily.           . calcium carbonate (OS-CAL - DOSED IN MG OF ELEMENTAL CALCIUM) 1250 MG tablet   Oral   Take 1 tablet by mouth every morning.         . estazolam (PROSOM) 1 MG tablet   Oral   Take 1 mg by mouth at bedtime.          . flurbiprofen (ANSAID) 100 MG tablet   Oral   Take 100 mg by mouth daily as needed (pain).          Marland Kitchen gabapentin (NEURONTIN) 300 MG capsule   Oral   Take 300-600 mg by mouth daily as needed (pain).          . Hypertonic Nasal Wash (SINUS RINSE NA)   Nasal  by Nasal route 2 (two) times daily.           Marland Kitchen levothyroxine (SYNTHROID, LEVOTHROID) 125 MCG tablet   Oral   Take 62.5 mcg by mouth daily before breakfast.          . losartan (COZAAR) 50 MG tablet   Oral   Take 50 mg by mouth every morning.          . magnesium hydroxide (MILK OF MAGNESIA) 400 MG/5ML suspension   Oral   Take 15 mLs by mouth daily as needed for constipation.          . metoprolol (LOPRESSOR) 50 MG tablet   Oral   Take 50 mg by mouth every morning.          . Multiple Vitamin (MULTIVITAMIN WITH MINERALS) TABS   Oral   Take 1 tablet by mouth every morning.         . nortriptyline (PAMELOR) 25 MG capsule   Oral   Take 2 capsules by mouth at bedtime as needed.          Marland Kitchen omalizumab (XOLAIR) 150 MG injection   Subcutaneous   Inject 150 mg into the skin every 28 (twenty-eight) days. Once montly         . omeprazole (PRILOSEC) 20 MG capsule   Oral   Take 20 mg by mouth every morning.         . polycarbophil (FIBERCON) 625 MG tablet   Oral   Take 625 mg by mouth 2 (two) times daily.         . simvastatin (ZOCOR) 5 MG tablet   Oral   Take 5 mg by mouth at  bedtime.          . triamcinolone cream (KENALOG) 0.1 %   Topical   Apply 1 application topically daily as needed (for rash or dryness).            BP 173/76  Pulse 83  Temp(Src) 97.9 F (36.6 C) (Oral)  Resp 15  Ht 5\' 7"  (1.702 m)  SpO2 95%  Physical Exam  Nursing note and vitals reviewed. Constitutional: She is oriented to person, place, and time. She appears well-developed and well-nourished. She appears distressed.  HENT:  Head: Normocephalic and atraumatic.  Right Ear: External ear normal.  Left Ear: External ear normal.  Nose: Nose normal.  Mouth/Throat: Oropharynx is clear and moist.  Eyes: Conjunctivae and EOM are normal. Pupils are equal, round, and reactive to light.  Neck: Normal range of motion. No JVD present. No tracheal deviation present. No thyromegaly present.  Cardiovascular: Normal rate, regular rhythm, normal heart sounds and intact distal pulses.  Exam reveals no gallop and no friction rub.   No murmur heard. Pulmonary/Chest: Effort normal and breath sounds normal. No stridor. No respiratory distress. She has no wheezes. She has no rales. She exhibits no tenderness.  Abdominal: Soft. Bowel sounds are normal. She exhibits no distension and no mass. There is no tenderness. There is no rebound and no guarding.  Musculoskeletal: She exhibits tenderness.  Left leg shortened and rotated.  Left wrist with crepitus, deformity.  Normal sensation, pulses intact in all extremities  Lymphadenopathy:    She has no cervical adenopathy.  Neurological: She is alert and oriented to person, place, and time. She exhibits normal muscle tone. Coordination normal.  Skin: Skin is warm and dry. No rash noted. No erythema. No pallor.    ED Course  Procedures (including critical care  time)  Labs Reviewed  BASIC METABOLIC PANEL - Abnormal; Notable for the following:    Sodium 132 (*)    Glucose, Bld 120 (*)    GFR calc non Af Amer 67 (*)    GFR calc Af Amer 78 (*)    All  other components within normal limits  CBC WITH DIFFERENTIAL - Abnormal; Notable for the following:    Hemoglobin 11.9 (*)    HCT 34.4 (*)    All other components within normal limits  URINALYSIS, MICROSCOPIC ONLY - Abnormal; Notable for the following:    Leukocytes, UA LARGE (*)    All other components within normal limits  PROTIME-INR  TYPE AND SCREEN  ABO/RH   Dg Chest 1 View  05/04/2012  *RADIOLOGY REPORT*  Clinical Data: Status post fall; history of asthma.  Concern for chest injury.  CHEST - 1 VIEW  Comparison: Chest radiograph performed 02/23/2011, and CTA of the chest performed 03/27/2010  Findings: The lungs are well-aerated.  Mild chronically increased interstitial markings are seen.  Bilateral densities reflect overlying soft tissues.  There is no evidence of focal opacification, pleural effusion or pneumothorax.  The cardiomediastinal silhouette is normal in size.  Calcification is noted within the aortic arch.  No acute osseous abnormalities are seen.  IMPRESSION: Mild chronic lung changes noted.  No acute cardiopulmonary process seen.  No displaced rib fractures identified.   Original Report Authenticated By: Tonia Ghent, M.D.    Dg Forearm Left  05/04/2012  *RADIOLOGY REPORT*  Clinical Data: Distal forearm pain and bruising post fall  LEFT FOREARM - 2 VIEW  Comparison: None  Findings: Minimally distracted ulnar styloid fracture. Transverse metaphyseal fracture distal left radius, minimally comminuted, with apex volar angulation. No definite intrarticular extension. Diffuse osseous demineralization. No additional fracture, dislocation or bone destruction.  IMPRESSION: Displaced ulnar styloid fracture. Mildly displaced and angulated comminuted distal left radial metaphyseal fracture.   Original Report Authenticated By: Ulyses Southward, M.D.    Dg Hip Complete Left  05/04/2012  *RADIOLOGY REPORT*  Clinical Data: Hip pain.  LEFT HIP - COMPLETE 2+ VIEW  Comparison: None.  Findings: There  appears to be a mildly displaced transcervical fracture of the left femoral neck, difficult to fully characterize. The left femoral head remains seated at the acetabulum.  The right hip joint is unremarkable in appearance.  Mild degenerative change is noted at the lower lumbar spine.  The sacroiliac joints are unremarkable in appearance.  The visualized bowel gas pattern is grossly unremarkable in appearance.  Scattered phleboliths are noted within the pelvis.  IMPRESSION: Mildly displaced transcervical fracture of the left femoral neck.   Original Report Authenticated By: Tonia Ghent, M.D.     Date: 05/03/2012  Rate: 83  Rhythm: normal sinus rhythm  QRS Axis: normal  Intervals: normal  ST/T Wave abnormalities: nonspecific ST/T changes  Conduction Disutrbances:none  Narrative Interpretation:   Old EKG Reviewed: unchanged    1. Left displaced femoral neck fracture, closed, initial encounter   2. Distal radius fracture, left, closed, initial encounter   3. Fracture of ulnar styloid, left, closed, initial encounter   4. Fall, initial encounter   5. Hyponatremia   6. HYPOTHYROIDISM   7. HYPERTENSION       MDM  79 year old female status post fall with deformity to left wrist, and left hip.  Suspect fractures of both.  We'll get baseline labs, get x-rays of hip and wrist.  Expect admission.  The hospital on the hospitalist service with consult  to orthopedics.  Patient and family updated on findings and plan.      Olivia Mackie, MD 05/04/12 [phone]

## 2012-05-04 NOTE — ED Notes (Signed)
Samuella Cota, (daughter in law)  [phone]

## 2012-05-04 NOTE — Progress Notes (Signed)
[facility] PROGRESS NOTE  Joanna [name]:[id number] DOB: 09/22/1928 DOA: 05/03/2012 PCP: Reather Littler, MD  Assessment/Plan: 1.   Left Hip Fracture and Fracture of distal radius and Ulna- Orthopedics on board.  NPO, PAinControl PRN  2. Fall due to Stumbling- accidental.  3. Hypothyroid- on Levothyroxine, check TSH.  4. HTN- IV Metoprolol, IV Hydralazine PRN.  5. COPD- stable Nebs PRN. Monitor O2 sats.  6. Anemia- send Anemia Panel, monitor trend.  7. Hyponatremia- IVFs with NSS, should correct, monitor Na+Levels.  8. DVT prophylaxis with SCDs.    Consultants: Orthopedics.  HPI/Subjective: Complaining of pain.   Objective: Filed Vitals:   05/04/12 0300 05/04/12 0315 05/04/12 0331 05/04/12 0433  BP: 157/91  146/67 105/53  Pulse:  53 130 115  Temp:   98 F (36.7 C) 97.1 F (36.2 C)  TempSrc:   Oral Oral  Resp:   19 20  Height:    5\' 7"  (1.702 m)  Weight:    61.326 kg (135 lb 3.2 oz)  SpO2:  99% 99% 98%    Intake/Output Summary (Last 24 hours) at 05/04/12 1159 Last data filed at 05/04/12 8119  Gross per 24 hour  Intake      0 ml  Output   2375 ml  Net  -2375 ml   Filed Weights   05/04/12 0433  Weight: 61.326 kg (135 lb 3.2 oz)    Exam:  CHEST: Normal respiration, clear to auscultation bilaterally  HEART: Regular rate and rhythm; no murmurs rubs or gallops  BACK: No kyphosis or scoliosis; no CVA tenderness  ABDOMEN: Positive Bowel Sounds, soft non-tender; no masses, no organomegaly.  EXTREMITIES: No cyanosis, clubbing or edema; no ulcerations   Data Reviewed: Basic Metabolic Panel:  Recent Labs Lab 05/04/12 0013 05/04/12 0552  NA 132* 133*  K 3.9 3.7  CL 96 98  CO2 27 24  GLUCOSE 120* 139*  BUN 18 16  CREATININE 0.80 0.67  CALCIUM 9.0 8.7   Liver Function Tests: No results found for this basename: AST, ALT, ALKPHOS, BILITOT, PROT, ALBUMIN,  in the last 168 hours No results found for this basename: LIPASE, AMYLASE,  in the last 168  hours No results found for this basename: AMMONIA,  in the last 168 hours CBC:  Recent Labs Lab 05/04/12 0013 05/04/12 0552  WBC 7.9 15.6*  NEUTROABS 5.2  --   HGB 11.9* 11.4*  HCT 34.4* 33.0*  MCV 84.9 84.4  PLT 244 247   Cardiac Enzymes: No results found for this basename: CKTOTAL, CKMB, CKMBINDEX, TROPONINI,  in the last 168 hours BNP (last 3 results) No results found for this basename: PROBNP,  in the last 8760 hours CBG: No results found for this basename: GLUCAP,  in the last 168 hours  No results found for this or any previous visit (from the past 240 hour(s)).   Studies: Dg Chest 1 View  05/04/2012  *RADIOLOGY REPORT*  Clinical Data: Status post fall; history of asthma.  Concern for chest injury.  CHEST - 1 VIEW  Comparison: Chest radiograph performed 02/23/2011, and CTA of the chest performed 03/27/2010  Findings: The lungs are well-aerated.  Mild chronically increased interstitial markings are seen.  Bilateral densities reflect overlying soft tissues.  There is no evidence of focal opacification, pleural effusion or pneumothorax.  The cardiomediastinal silhouette is normal in size.  Calcification is noted within the aortic arch.  No acute osseous abnormalities are seen.  IMPRESSION: Mild chronic lung changes noted.  No acute cardiopulmonary  process seen.  No displaced rib fractures identified.   Original Report Authenticated By: Tonia Ghent, M.D.    Dg Forearm Left  05/04/2012  *RADIOLOGY REPORT*  Clinical Data: Distal forearm pain and bruising post fall  LEFT FOREARM - 2 VIEW  Comparison: None  Findings: Minimally distracted ulnar styloid fracture. Transverse metaphyseal fracture distal left radius, minimally comminuted, with apex volar angulation. No definite intrarticular extension. Diffuse osseous demineralization. No additional fracture, dislocation or bone destruction.  IMPRESSION: Displaced ulnar styloid fracture. Mildly displaced and angulated comminuted distal left  radial metaphyseal fracture.   Original Report Authenticated By: Ulyses Southward, M.D.    Dg Hip Complete Left  05/04/2012  *RADIOLOGY REPORT*  Clinical Data: Hip pain.  LEFT HIP - COMPLETE 2+ VIEW  Comparison: None.  Findings: There appears to be a mildly displaced transcervical fracture of the left femoral neck, difficult to fully characterize. The left femoral head remains seated at the acetabulum.  The right hip joint is unremarkable in appearance.  Mild degenerative change is noted at the lower lumbar spine.  The sacroiliac joints are unremarkable in appearance.  The visualized bowel gas pattern is grossly unremarkable in appearance.  Scattered phleboliths are noted within the pelvis.  IMPRESSION: Mildly displaced transcervical fracture of the left femoral neck.   Original Report Authenticated By: Tonia Ghent, M.D.     Scheduled Meds: . amLODipine  2.5 mg Oral q morning - 10a  . levothyroxine  62.5 mcg Oral QHS  . losartan  50 mg Oral q morning - 10a  . metoprolol  2.5 mg Intravenous Q6H   Continuous Infusions: . sodium chloride 75 mL/hr at 05/04/12 0320    Principal Problem:   Closed left hip fracture Active Problems:   HYPOTHYROIDISM   HYPERTENSION   COPD   Fracture of radial shaft, with ulna, left, closed   Fall due to stumbling   Anemia   Hyponatremia        Joanna [name]  [facility] Pager [phone] If 7PM-7AM, please contact night-coverage at [email or web address], password [facility] 05/04/2012, 11:59 AM  LOS: 1 day

## 2012-05-04 NOTE — H&P (View-Only) (Signed)
Reason for Consult:Left femoral neck and distal radius/ulna fracture Referring Physician: Dr. Della Goo  Joanna [name] is an 79 y.o. female.  HPI: Joanna [name] was taking care of her dog last evening and getting ready to let her out, when she slipped on a wet floor and landed on her left side with immediate left hip and left wrist pain. Denies dizziness, light headedness, syncope, chest pain, SOB, palpitations or other problems prior to fall. This was purely a mechanical fall, slipping on a wet floor. She was eventually able to crawl to a phone and call for help. She was rushed to [facility] ED and found to have displaced femoral neck and distal radius/ulna fractures.Her only complaints now are wrist and hip pain. No paresthesias or inability to move fingers/toes. She is independent and lives alone at home with her pet dog. She did not have any hip problems prior to this fall.  Past Medical History  Diagnosis Date  . IBS (irritable bowel syndrome)   . Hypothyroid   . GERD (gastroesophageal reflux disease)   . Hypertension   . Allergic rhinitis   . Asthma     Past Surgical History  Procedure Laterality Date  . Mastectomy      double  . Bladder surgery    . Nasal sinus surgery      Family History  Problem Relation Age of Onset  . CAD Mother   . Hypertension Father   . Breast cancer Sister     Social History:  reports that she has never smoked. She has never used smokeless tobacco. She reports that she does not drink alcohol or use illicit drugs.  Allergies:  Allergies  Allergen Reactions  . Cefuroxime Axetil Hives  . Lactose Intolerance (Gi) Other (See Comments)    Gi upset  . Prednisone Other (See Comments)    Pt unsure of reaction  . Clarithromycin Palpitations  . Codeine Palpitations  . Sulfonamide Derivatives Palpitations    Medications: I have reviewed the patient's current medications.  Results for orders placed during the hospital encounter of 05/03/12 (from the  past 48 hour(s))  URINALYSIS, MICROSCOPIC ONLY     Status: Abnormal   Collection Time    05/04/12 12:10 AM      Result Value Range   Color, Urine YELLOW  YELLOW   APPearance CLEAR  CLEAR   Specific Gravity, Urine 1.011  1.005 - 1.030   pH 7.5  5.0 - 8.0   Glucose, UA NEGATIVE  NEGATIVE mg/dL   Hgb urine dipstick NEGATIVE  NEGATIVE   Bilirubin Urine NEGATIVE  NEGATIVE   Ketones, ur NEGATIVE  NEGATIVE mg/dL   Protein, ur NEGATIVE  NEGATIVE mg/dL   Urobilinogen, UA 0.2  0.0 - 1.0 mg/dL   Nitrite NEGATIVE  NEGATIVE   Leukocytes, UA LARGE (*) NEGATIVE   WBC, UA 7-10  <3 WBC/hpf   Comment: WITH CLUMPS  BASIC METABOLIC PANEL     Status: Abnormal   Collection Time    05/04/12 12:13 AM      Result Value Range   Sodium 132 (*) 135 - 145 mEq/L   Potassium 3.9  3.5 - 5.1 mEq/L   Chloride 96  96 - 112 mEq/L   CO2 27  19 - 32 mEq/L   Glucose, Bld 120 (*) 70 - 99 mg/dL   BUN 18  6 - 23 mg/dL   Creatinine, Ser 6.04  0.50 - 1.10 mg/dL   Calcium 9.0  8.4 - 54.0 mg/dL  GFR calc non Af Amer 67 (*) >90 mL/min   GFR calc Af Amer 78 (*) >90 mL/min   Comment:            The eGFR has been calculated     using the CKD EPI equation.     This calculation has not been     validated in all clinical     situations.     eGFR's persistently     <90 mL/min signify     possible Chronic Kidney Disease.  CBC WITH DIFFERENTIAL     Status: Abnormal   Collection Time    05/04/12 12:13 AM      Result Value Range   WBC 7.9  4.0 - 10.5 K/uL   RBC 4.05  3.87 - 5.11 MIL/uL   Hemoglobin 11.9 (*) 12.0 - 15.0 g/dL   HCT 30.8 (*) 65.7 - 84.6 %   MCV 84.9  78.0 - 100.0 fL   MCH 29.4  26.0 - 34.0 pg   MCHC 34.6  30.0 - 36.0 g/dL   RDW 96.2  95.2 - 84.1 %   Platelets 244  150 - 400 K/uL   Neutrophils Relative 65  43 - 77 %   Neutro Abs 5.2  1.7 - 7.7 K/uL   Lymphocytes Relative 27  12 - 46 %   Lymphs Abs 2.1  0.7 - 4.0 K/uL   Monocytes Relative 6  3 - 12 %   Monocytes Absolute 0.5  0.1 - 1.0 K/uL    Eosinophils Relative 1  0 - 5 %   Eosinophils Absolute 0.1  0.0 - 0.7 K/uL   Basophils Relative 0  0 - 1 %   Basophils Absolute 0.0  0.0 - 0.1 K/uL  PROTIME-INR     Status: None   Collection Time    05/04/12 12:13 AM      Result Value Range   Prothrombin Time 13.1  11.6 - 15.2 seconds   INR 1.00  0.00 - 1.49  TYPE AND SCREEN     Status: None   Collection Time    05/04/12 12:13 AM      Result Value Range   ABO/RH(D) O POS     Antibody Screen POS     Sample Expiration 05/07/2012     Antibody Identification ANTI-Jka (KIDD a)     PT AG Type NEGATIVE FOR KIDD A ANTIGEN     DAT, IgG NEG    ABO/RH     Status: None   Collection Time    05/04/12 12:13 AM      Result Value Range   ABO/RH(D) O POS    BASIC METABOLIC PANEL     Status: Abnormal   Collection Time    05/04/12  5:52 AM      Result Value Range   Sodium 133 (*) 135 - 145 mEq/L   Potassium 3.7  3.5 - 5.1 mEq/L   Chloride 98  96 - 112 mEq/L   CO2 24  19 - 32 mEq/L   Glucose, Bld 139 (*) 70 - 99 mg/dL   BUN 16  6 - 23 mg/dL   Creatinine, Ser 3.24  0.50 - 1.10 mg/dL   Calcium 8.7  8.4 - 40.1 mg/dL   GFR calc non Af Amer 80 (*) >90 mL/min   GFR calc Af Amer >90  >90 mL/min   Comment:            The eGFR has been calculated  using the CKD EPI equation.     This calculation has not been     validated in all clinical     situations.     eGFR's persistently     <90 mL/min signify     possible Chronic Kidney Disease.  CBC     Status: Abnormal   Collection Time    05/04/12  5:52 AM      Result Value Range   WBC 15.6 (*) 4.0 - 10.5 K/uL   RBC 3.91  3.87 - 5.11 MIL/uL   Hemoglobin 11.4 (*) 12.0 - 15.0 g/dL   HCT 21.3 (*) 08.6 - 57.8 %   MCV 84.4  78.0 - 100.0 fL   MCH 29.2  26.0 - 34.0 pg   MCHC 34.5  30.0 - 36.0 g/dL   RDW 46.9  62.9 - 52.8 %   Platelets 247  150 - 400 K/uL  RETICULOCYTES     Status: None   Collection Time    05/04/12  5:52 AM      Result Value Range   Retic Ct Pct 1.0  0.4 - 3.1 %   RBC. 3.97   3.87 - 5.11 MIL/uL   Retic Count, Manual 39.7  19.0 - 186.0 K/uL    Dg Chest 1 View  05/04/2012  *RADIOLOGY REPORT*  Clinical Data: Status post fall; history of asthma.  Concern for chest injury.  CHEST - 1 VIEW  Comparison: Chest radiograph performed 02/23/2011, and CTA of the chest performed 03/27/2010  Findings: The lungs are well-aerated.  Mild chronically increased interstitial markings are seen.  Bilateral densities reflect overlying soft tissues.  There is no evidence of focal opacification, pleural effusion or pneumothorax.  The cardiomediastinal silhouette is normal in size.  Calcification is noted within the aortic arch.  No acute osseous abnormalities are seen.  IMPRESSION: Mild chronic lung changes noted.  No acute cardiopulmonary process seen.  No displaced rib fractures identified.   Original Report Authenticated By: Tonia Ghent, M.D.    Dg Forearm Left  05/04/2012  *RADIOLOGY REPORT*  Clinical Data: Distal forearm pain and bruising post fall  LEFT FOREARM - 2 VIEW  Comparison: None  Findings: Minimally distracted ulnar styloid fracture. Transverse metaphyseal fracture distal left radius, minimally comminuted, with apex volar angulation. No definite intrarticular extension. Diffuse osseous demineralization. No additional fracture, dislocation or bone destruction.  IMPRESSION: Displaced ulnar styloid fracture. Mildly displaced and angulated comminuted distal left radial metaphyseal fracture.   Original Report Authenticated By: Ulyses Southward, M.D.    Dg Hip Complete Left  05/04/2012  *RADIOLOGY REPORT*  Clinical Data: Hip pain.  LEFT HIP - COMPLETE 2+ VIEW  Comparison: None.  Findings: There appears to be a mildly displaced transcervical fracture of the left femoral neck, difficult to fully characterize. The left femoral head remains seated at the acetabulum.  The right hip joint is unremarkable in appearance.  Mild degenerative change is noted at the lower lumbar spine.  The sacroiliac joints  are unremarkable in appearance.  The visualized bowel gas pattern is grossly unremarkable in appearance.  Scattered phleboliths are noted within the pelvis.  IMPRESSION: Mildly displaced transcervical fracture of the left femoral neck.   Original Report Authenticated By: Tonia Ghent, M.D.     ROS Blood pressure 105/53, pulse 115, temperature 97.1 F (36.2 C), temperature source Oral, resp. rate 20, height 5\' 7"  (1.702 m), weight 135 lb 3.2 oz (61.326 kg), SpO2 98.00%. Physical Exam  Physical Examination: General appearance - alert, well  appearing, and in no distress Mental status - alert, oriented to person, place, and time Neck - supple, no significant adenopathy Chest - clear to auscultation, no wheezes, rales or rhonchi, symmetric air entry Heart - normal rate, regular rhythm, normal S1, S2, no murmurs, rubs, clicks or gallops Abdomen - soft, nontender, nondistended, no masses or organomegaly Neurological - alert, oriented, normal speech, no focal findings or movement disorder noted Left upper extremity in short arm splint. Able to move all fingers, sensation intact in fingers, cap refill < 3 seconds Left lower extremity short and externally rotated, EHL/FHL/TA/gastroc intact; + DP/PT pulses; sensation intact  X-rays- Displaced left femoral neck fracture Displaced distal radius,ulna fract; extraarticular with angulation and comminution.   Assessment/Plan: 1- Left femoral neck fracture- Will require hemiarthroplasty. Discussed procedure, risks, potential complications and rehab course (including probable need for inpatient rehab given that she lives alone), and she would like to proceed. I have spoken with Dr. Charlann Boxer who is on call today and he will try to do this today. If not, then I will be able to do it tomorrow afternoon 2-Left distal radius/ulna fracture- Will need closed reduction which can be done in OR. I have told her that there is a chance that the bones may not stay reduced and  that she may eventually need ORIF, but that for today a closed reduction would be performed.She understands and agrees with the plan.  Loanne Drilling 05/04/2012, 7:39 AM

## 2012-05-04 NOTE — Transfer of Care (Signed)
Immediate Anesthesia Transfer of Care Note  Patient: Joanna [name]  Procedure(s) Performed: Procedure(s) with comments: ARTHROPLASTY BIPOLAR HIP (Left) CLOSED REDUCTION WRIST (Left) - with casting  Patient Location: PACU  Anesthesia Type:General  Level of Consciousness: awake, alert , patient cooperative and responds to stimulation  Airway & Oxygen Therapy: Patient Spontanous Breathing and Patient connected to face mask oxygen  Post-op Assessment: Report given to PACU RN, Post -op Vital signs reviewed and stable and Patient moving all extremities X 4  Post vital signs: stable  Complications: No apparent anesthesia complications

## 2012-05-04 NOTE — Anesthesia Preprocedure Evaluation (Signed)
Anesthesia Evaluation  Patient identified by MRN, date of birth, ID band Patient awake    Reviewed: Allergy & Precautions, H&P , NPO status , Patient's Chart, lab work & pertinent test results, reviewed documented beta blocker date and time   History of Anesthesia Complications Negative for: history of anesthetic complications  Airway Mallampati: II      Dental  (+) Teeth Intact   Pulmonary asthma , COPD COPD inhaler,  breath sounds clear to auscultation  Pulmonary exam normal       Cardiovascular hypertension, On Medications and On Home Beta Blockers Rhythm:regular Rate:Normal     Neuro/Psych negative neurological ROS  negative psych ROS   GI/Hepatic Neg liver ROS, GERD-  Medicated,IBS   Endo/Other  Hypothyroidism   Renal/GU   negative genitourinary   Musculoskeletal   Abdominal   Peds  Hematology  (+) anemia ,   Anesthesia Other Findings Multiple allergies Last ate at 2300 05/03/12 prior to fall  Reproductive/Obstetrics negative OB ROS                           Anesthesia Physical Anesthesia Plan  ASA: III  Anesthesia Plan: General ETT   Post-op Pain Management:    Induction:   Airway Management Planned:   Additional Equipment:   Intra-op Plan:   Post-operative Plan:   Informed Consent: I have reviewed the patients History and Physical, chart, labs and discussed the procedure including the risks, benefits and alternatives for the proposed anesthesia with the patient or authorized representative who has indicated his/her understanding and acceptance.   Dental Advisory Given  Plan Discussed with: CRNA and Surgeon  Anesthesia Plan Comments:         Anesthesia Quick Evaluation

## 2012-05-04 NOTE — Brief Op Note (Signed)
05/03/2012 - 05/04/2012  4:55 PM  PATIENT:  Joanna [name]  79 y.o. female  PRE-OPERATIVE DIAGNOSIS:  1. Left displaced femoral neck fracture, 2.  Closed left distal radius fracture  POST-OPERATIVE DIAGNOSIS:  1. Left displaced femoral neck fracture, 2.  Closed left distal radius fracture  PROCEDURE:  Procedure(s) with comments: 1.  Left hip hemiARTHROPLASTY - Depuy, 5 std, 47+0 2.  CLOSED REDUCTION WRIST (Left) - application long arm splint  SURGEON:  Surgeon(s) and Role:    * Shelda Pal, MD - Primary  PHYSICIAN ASSISTANT: Lanney Gins, PA-C  ANESTHESIA:   general  EBL:  Total I/O In: 1000 [I.V.:1000] Out: 450 [Urine:250; Blood:200]  BLOOD ADMINISTERED:none  DRAINS: (1 medium ) Hemovact drain(s) in the left hip with  Suction Open   LOCAL MEDICATIONS USED:  NONE  SPECIMEN:  No Specimen  DISPOSITION OF SPECIMEN:  N/A  COUNTS:  YES  TOURNIQUET:  * No tourniquets in log *  DICTATION: .Other Dictation: Dictation Number [id number]  PLAN OF CARE: Admit to inpatient   PATIENT DISPOSITION:  PACU - hemodynamically stable.   Delay start of Pharmacological VTE agent (>24hrs) due to surgical blood loss or risk of bleeding: no

## 2012-05-04 NOTE — Consult Note (Signed)
Reason for Consult:Left femoral neck and distal radius/ulna fracture Referring Physician: Dr. Harvette Jenkins  Joanna [name] is an 79 y.o. female.  HPI: Joanna [name] was taking care of her dog last evening and getting ready to let her out, when she slipped on a wet floor and landed on her left side with immediate left hip and left wrist pain. Denies dizziness, light headedness, syncope, chest pain, SOB, palpitations or other problems prior to fall. This was purely a mechanical fall, slipping on a wet floor. She was eventually able to crawl to a phone and call for help. She was rushed to [facility] ED and found to have displaced femoral neck and distal radius/ulna fractures.Her only complaints now are wrist and hip pain. No paresthesias or inability to move fingers/toes. She is independent and lives alone at home with her pet dog. She did not have any hip problems prior to this fall.  Past Medical History  Diagnosis Date  . IBS (irritable bowel syndrome)   . Hypothyroid   . GERD (gastroesophageal reflux disease)   . Hypertension   . Allergic rhinitis   . Asthma     Past Surgical History  Procedure Laterality Date  . Mastectomy      double  . Bladder surgery    . Nasal sinus surgery      Family History  Problem Relation Age of Onset  . CAD Mother   . Hypertension Father   . Breast cancer Sister     Social History:  reports that she has never smoked. She has never used smokeless tobacco. She reports that she does not drink alcohol or use illicit drugs.  Allergies:  Allergies  Allergen Reactions  . Cefuroxime Axetil Hives  . Lactose Intolerance (Gi) Other (See Comments)    Gi upset  . Prednisone Other (See Comments)    Pt unsure of reaction  . Clarithromycin Palpitations  . Codeine Palpitations  . Sulfonamide Derivatives Palpitations    Medications: I have reviewed the patient's current medications.  Results for orders placed during the hospital encounter of 05/03/12 (from the  past 48 hour(s))  URINALYSIS, MICROSCOPIC ONLY     Status: Abnormal   Collection Time    05/04/12 12:10 AM      Result Value Range   Color, Urine YELLOW  YELLOW   APPearance CLEAR  CLEAR   Specific Gravity, Urine 1.011  1.005 - 1.030   pH 7.5  5.0 - 8.0   Glucose, UA NEGATIVE  NEGATIVE mg/dL   Hgb urine dipstick NEGATIVE  NEGATIVE   Bilirubin Urine NEGATIVE  NEGATIVE   Ketones, ur NEGATIVE  NEGATIVE mg/dL   Protein, ur NEGATIVE  NEGATIVE mg/dL   Urobilinogen, UA 0.2  0.0 - 1.0 mg/dL   Nitrite NEGATIVE  NEGATIVE   Leukocytes, UA LARGE (*) NEGATIVE   WBC, UA 7-10  <3 WBC/hpf   Comment: WITH CLUMPS  BASIC METABOLIC PANEL     Status: Abnormal   Collection Time    05/04/12 12:13 AM      Result Value Range   Sodium 132 (*) 135 - 145 mEq/L   Potassium 3.9  3.5 - 5.1 mEq/L   Chloride 96  96 - 112 mEq/L   CO2 27  19 - 32 mEq/L   Glucose, Bld 120 (*) 70 - 99 mg/dL   BUN 18  6 - 23 mg/dL   Creatinine, Ser 0.80  0.50 - 1.10 mg/dL   Calcium 9.0  8.4 - 10.5 mg/dL     GFR calc non Af Amer 67 (*) >90 mL/min   GFR calc Af Amer 78 (*) >90 mL/min   Comment:            The eGFR has been calculated     using the CKD EPI equation.     This calculation has not been     validated in all clinical     situations.     eGFR's persistently     <90 mL/min signify     possible Chronic Kidney Disease.  CBC WITH DIFFERENTIAL     Status: Abnormal   Collection Time    05/04/12 12:13 AM      Result Value Range   WBC 7.9  4.0 - 10.5 K/uL   RBC 4.05  3.87 - 5.11 MIL/uL   Hemoglobin 11.9 (*) 12.0 - 15.0 g/dL   HCT 34.4 (*) 36.0 - 46.0 %   MCV 84.9  78.0 - 100.0 fL   MCH 29.4  26.0 - 34.0 pg   MCHC 34.6  30.0 - 36.0 g/dL   RDW 13.4  11.5 - 15.5 %   Platelets 244  150 - 400 K/uL   Neutrophils Relative 65  43 - 77 %   Neutro Abs 5.2  1.7 - 7.7 K/uL   Lymphocytes Relative 27  12 - 46 %   Lymphs Abs 2.1  0.7 - 4.0 K/uL   Monocytes Relative 6  3 - 12 %   Monocytes Absolute 0.5  0.1 - 1.0 K/uL    Eosinophils Relative 1  0 - 5 %   Eosinophils Absolute 0.1  0.0 - 0.7 K/uL   Basophils Relative 0  0 - 1 %   Basophils Absolute 0.0  0.0 - 0.1 K/uL  PROTIME-INR     Status: None   Collection Time    05/04/12 12:13 AM      Result Value Range   Prothrombin Time 13.1  11.6 - 15.2 seconds   INR 1.00  0.00 - 1.49  TYPE AND SCREEN     Status: None   Collection Time    05/04/12 12:13 AM      Result Value Range   ABO/RH(D) O POS     Antibody Screen POS     Sample Expiration 05/07/2012     Antibody Identification ANTI-Jka (KIDD a)     PT AG Type NEGATIVE FOR KIDD A ANTIGEN     DAT, IgG NEG    ABO/RH     Status: None   Collection Time    05/04/12 12:13 AM      Result Value Range   ABO/RH(D) O POS    BASIC METABOLIC PANEL     Status: Abnormal   Collection Time    05/04/12  5:52 AM      Result Value Range   Sodium 133 (*) 135 - 145 mEq/L   Potassium 3.7  3.5 - 5.1 mEq/L   Chloride 98  96 - 112 mEq/L   CO2 24  19 - 32 mEq/L   Glucose, Bld 139 (*) 70 - 99 mg/dL   BUN 16  6 - 23 mg/dL   Creatinine, Ser 0.67  0.50 - 1.10 mg/dL   Calcium 8.7  8.4 - 10.5 mg/dL   GFR calc non Af Amer 80 (*) >90 mL/min   GFR calc Af Amer >90  >90 mL/min   Comment:            The eGFR has been calculated       using the CKD EPI equation.     This calculation has not been     validated in all clinical     situations.     eGFR's persistently     <90 mL/min signify     possible Chronic Kidney Disease.  CBC     Status: Abnormal   Collection Time    05/04/12  5:52 AM      Result Value Range   WBC 15.6 (*) 4.0 - 10.5 K/uL   RBC 3.91  3.87 - 5.11 MIL/uL   Hemoglobin 11.4 (*) 12.0 - 15.0 g/dL   HCT 33.0 (*) 36.0 - 46.0 %   MCV 84.4  78.0 - 100.0 fL   MCH 29.2  26.0 - 34.0 pg   MCHC 34.5  30.0 - 36.0 g/dL   RDW 13.4  11.5 - 15.5 %   Platelets 247  150 - 400 K/uL  RETICULOCYTES     Status: None   Collection Time    05/04/12  5:52 AM      Result Value Range   Retic Ct Pct 1.0  0.4 - 3.1 %   RBC. 3.97   3.87 - 5.11 MIL/uL   Retic Count, Manual 39.7  19.0 - 186.0 K/uL    Dg Chest 1 View  05/04/2012  *RADIOLOGY REPORT*  Clinical Data: Status post fall; history of asthma.  Concern for chest injury.  CHEST - 1 VIEW  Comparison: Chest radiograph performed 02/23/2011, and CTA of the chest performed 03/27/2010  Findings: The lungs are well-aerated.  Mild chronically increased interstitial markings are seen.  Bilateral densities reflect overlying soft tissues.  There is no evidence of focal opacification, pleural effusion or pneumothorax.  The cardiomediastinal silhouette is normal in size.  Calcification is noted within the aortic arch.  No acute osseous abnormalities are seen.  IMPRESSION: Mild chronic lung changes noted.  No acute cardiopulmonary process seen.  No displaced rib fractures identified.   Original Report Authenticated By: Jeffrey Chang, M.D.    Dg Forearm Left  05/04/2012  *RADIOLOGY REPORT*  Clinical Data: Distal forearm pain and bruising post fall  LEFT FOREARM - 2 VIEW  Comparison: None  Findings: Minimally distracted ulnar styloid fracture. Transverse metaphyseal fracture distal left radius, minimally comminuted, with apex volar angulation. No definite intrarticular extension. Diffuse osseous demineralization. No additional fracture, dislocation or bone destruction.  IMPRESSION: Displaced ulnar styloid fracture. Mildly displaced and angulated comminuted distal left radial metaphyseal fracture.   Original Report Authenticated By: Mark Boles, M.D.    Dg Hip Complete Left  05/04/2012  *RADIOLOGY REPORT*  Clinical Data: Hip pain.  LEFT HIP - COMPLETE 2+ VIEW  Comparison: None.  Findings: There appears to be a mildly displaced transcervical fracture of the left femoral neck, difficult to fully characterize. The left femoral head remains seated at the acetabulum.  The right hip joint is unremarkable in appearance.  Mild degenerative change is noted at the lower lumbar spine.  The sacroiliac joints  are unremarkable in appearance.  The visualized bowel gas pattern is grossly unremarkable in appearance.  Scattered phleboliths are noted within the pelvis.  IMPRESSION: Mildly displaced transcervical fracture of the left femoral neck.   Original Report Authenticated By: Jeffrey Chang, M.D.     ROS Blood pressure 105/53, pulse 115, temperature 97.1 F (36.2 C), temperature source Oral, resp. rate 20, height 5' 7" (1.702 m), weight 135 lb 3.2 oz (61.326 kg), SpO2 98.00%. Physical Exam  Physical Examination: General appearance - alert, well   appearing, and in no distress Mental status - alert, oriented to person, place, and time Neck - supple, no significant adenopathy Chest - clear to auscultation, no wheezes, rales or rhonchi, symmetric air entry Heart - normal rate, regular rhythm, normal S1, S2, no murmurs, rubs, clicks or gallops Abdomen - soft, nontender, nondistended, no masses or organomegaly Neurological - alert, oriented, normal speech, no focal findings or movement disorder noted Left upper extremity in short arm splint. Able to move all fingers, sensation intact in fingers, cap refill < 3 seconds Left lower extremity short and externally rotated, EHL/FHL/TA/gastroc intact; + DP/PT pulses; sensation intact  X-rays- Displaced left femoral neck fracture Displaced distal radius,ulna fract; extraarticular with angulation and comminution.   Assessment/Plan: 1- Left femoral neck fracture- Will require hemiarthroplasty. Discussed procedure, risks, potential complications and rehab course (including probable need for inpatient rehab given that she lives alone), and she would like to proceed. I have spoken with Dr. Olin who is on call today and he will try to do this today. If not, then I will be able to do it tomorrow afternoon 2-Left distal radius/ulna fracture- Will need closed reduction which can be done in OR. I have told her that there is a chance that the bones may not stay reduced and  that she may eventually need ORIF, but that for today a closed reduction would be performed.She understands and agrees with the plan.  Diesha Rostad V 05/04/2012, 7:39 AM      

## 2012-05-05 ENCOUNTER — Encounter (HOSPITAL_COMMUNITY): Payer: Self-pay | Admitting: Orthopedic Surgery

## 2012-05-05 LAB — BASIC METABOLIC PANEL
BUN: 16 mg/dL (ref 6–23)
CO2: 24 mEq/L (ref 19–32)
Calcium: 8.3 mg/dL — ABNORMAL LOW (ref 8.4–10.5)
Chloride: 99 mEq/L (ref 96–112)
Creatinine, Ser: 0.75 mg/dL (ref 0.50–1.10)
GFR calc Af Amer: 90 mL/min — ABNORMAL LOW (ref >90)
GFR calc non Af Amer: 77 mL/min — ABNORMAL LOW (ref >90)
Glucose, Bld: 124 mg/dL — ABNORMAL HIGH (ref 70–99)
Potassium: 4 mEq/L (ref 3.5–5.1)
Sodium: 129 mEq/L — ABNORMAL LOW (ref 135–145)

## 2012-05-05 LAB — CBC
HCT: 26.8 % — ABNORMAL LOW (ref 36.0–46.0)
Hemoglobin: 9.2 g/dL — ABNORMAL LOW (ref 12.0–15.0)
MCH: 29.4 pg (ref 26.0–34.0)
MCHC: 34.3 g/dL (ref 30.0–36.0)
MCV: 85.6 fL (ref 78.0–100.0)
Platelets: 188 10*3/uL (ref 150–400)
RBC: 3.13 MIL/uL — ABNORMAL LOW (ref 3.87–5.11)
RDW: 13.8 % (ref 11.5–15.5)
WBC: 12.5 10*3/uL — ABNORMAL HIGH (ref 4.0–10.5)

## 2012-05-05 LAB — PROTIME-INR
INR: 1.15 (ref 0.00–1.49)
Prothrombin Time: 14.5 seconds (ref 11.6–15.2)

## 2012-05-05 MED ORDER — HYDROCODONE-ACETAMINOPHEN 5-325 MG PO TABS: 1.0000 | ORAL_TABLET | Freq: Four times a day (QID) | ORAL | Status: DC | PRN

## 2012-05-05 MED ORDER — RANITIDINE HCL 150 MG/10ML PO SYRP
150.0000 mg | ORAL_SOLUTION | Freq: Two times a day (BID) | ORAL | Status: DC | PRN
  Administered 2012-05-05: 150 mg via ORAL
  Filled 2012-05-05: qty 10

## 2012-05-05 MED ORDER — ENSURE COMPLETE PO LIQD
237.0000 mL | Freq: Two times a day (BID) | ORAL | Status: DC
  Administered 2012-05-06 – 2012-05-07 (×3): 237 mL via ORAL

## 2012-05-05 MED ORDER — WARFARIN SODIUM 4 MG PO TABS
4.0000 mg | ORAL_TABLET | Freq: Once | ORAL | Status: AC
  Administered 2012-05-05: 4 mg via ORAL
  Filled 2012-05-05: qty 1

## 2012-05-05 MED ORDER — METOPROLOL TARTRATE 50 MG PO TABS
50.0000 mg | ORAL_TABLET | Freq: Every day | ORAL | Status: DC
  Administered 2012-05-05 – 2012-05-06 (×2): 50 mg via ORAL
  Filled 2012-05-05 (×4): qty 1

## 2012-05-05 MED ORDER — ENOXAPARIN SODIUM 40 MG/0.4ML ~~LOC~~ SOLN: 40.0000 mg | SUBCUTANEOUS | Status: DC

## 2012-05-05 NOTE — Progress Notes (Addendum)
PT Cancellation Note  Patient Details Name: Joanna [name] MRN: [id number] DOB: 1928/03/08   Cancelled Treatment:    Reason Eval/Treat Not Completed: Pain limiting ability to participate Pt reports MD told her not to get OOB and states Dr. Charlann Boxer did not visit this morning.  Encouraged pt getting OOB and learning to use platform RW however pt reports too much pain and that she was not moving today.  Pt also stated "no promises" about getting up with therapy tomorrow.  Will check back tomorrow for evaluation.     Kleber Crean,KATHrine E 05/05/2012, 2:57 PM Zenovia Jarred, PT, DPT 05/05/2012 Pager: [phone]

## 2012-05-05 NOTE — Evaluation (Signed)
Occupational Therapy Evaluation Patient Details Name: Joanna [name] MRN: [id number] DOB: 1928-08-16 Today's Date: 05/05/2012 Time: 5784-6962 OT Time Calculation (min): 19 min  OT Assessment / Plan / Recommendation Clinical Impression  Pt presents to OT s/p fall with L hip and radius fracture. Pt in significant pain and was confused during OT eval in which both were limiting to pt.  Pt will need further evaluation of OT, as well as post acute therapy- likely ST SNF    OT Assessment  Patient needs continued OT Services    Follow Up Recommendations  SNF       Equipment Recommendations  None recommended by OT       Frequency  Min 2X/week    Precautions / Restrictions Precautions Precautions: Fall Precaution Comments: WBAT LLE.  NWB R hand/wrist. Pt can use platform       ADL       OT Diagnosis: Generalized weakness;Acute pain  OT Problem List: Decreased strength;Decreased activity tolerance;Decreased safety awareness;Pain;Impaired UE functional use;Decreased cognition OT Treatment Interventions: Self-care/ADL training;Patient/family education;DME and/or AE instruction   OT Goals Acute Rehab OT Goals OT Goal Formulation: With patient Time For Goal Achievement: 05/12/12 Miscellaneous OT Goals Miscellaneous OT Goal #1: Pt will perform LUE finger ROM to maintain ROM and prevent further edema with S and verbal cues.. Miscellaneous OT Goal #2: Pt will further tolerate OOB OT evaluation   Visit Information  Last OT Received On: 05/05/12    Subjective Data  Subjective: Pt repeated self regarding not doing work outside multiple times   Prior Functioning     Home Living Lives With: Alone Type of Home: House Home Layout: One level Home Adaptive Equipment: None Prior Function Level of Independence: Independent Comments: alll info per pt.  Will need to verify with family Communication Communication: No difficulties Dominant Hand: Right            Cognition  Cognition Arousal/Alertness: Awake/alert Behavior During Therapy: Anxious    Extremity/Trunk Assessment Right Upper Extremity Assessment RUE ROM/Strength/Tone: Within functional levels Left Upper Extremity Assessment LUE ROM/Strength/Tone: Unable to fully assess;Due to precautions (encouraged finger extension and flexion of fingers) LUE Coordination: Deficits LUE Coordination Deficits: edema limiting ROM     Mobility Bed Mobility Bed Mobility: Rolling Right;Supine to Sit Supine to Sit: HOB elevated;1: +1 Total assist Details for Bed Mobility Assistance: Pt intiated movement to EOB in prep for sitting, but pain limiting pt.  Pt able to move left leg toward EOB with support of OT, but then would scream in pain.  RN notified. Pt returned to bed and OT focused on LUE AROM Transfers Transfers: Not assessed           End of Session OT - End of Session Activity Tolerance: Patient limited by pain Patient left: in bed;Other (comment) (sitter present) Nurse Communication: Patient requests pain meds  GO     Alba Cory 05/05/2012, 11:52 AM

## 2012-05-05 NOTE — Progress Notes (Signed)
Patient ID: Joanna [name], female   DOB: 05/20/1928, 79 y.o.   MRN: [id number] Subjective: 1 Day Post-Op Procedure(s) (LRB): ARTHROPLASTY BIPOLAR HIP (Left) CLOSED REDUCTION WRIST (Left)    Patient reports pain as mild.  Mild dementia, found standing in room unsupervised - sitter now in room as is her daughter in law  Objective:   VITALS:   Filed Vitals:   05/05/12 1200  BP:   Pulse:   Temp:   Resp: 18    Neurovascular intact LUE splint intact, swelling and bruising as noted yesterday present in hand, moves fingers but uncomfortable  Left hip wounds dressing C/D/I  LABS  Recent Labs  05/04/12 0013 05/04/12 0552 05/05/12 0434  HGB 11.9* 11.4* 9.2*  HCT 34.4* 33.0* 26.8*  WBC 7.9 15.6* 12.5*  PLT 244 247 188     Recent Labs  05/04/12 0013 05/04/12 0552 05/05/12 0434  NA 132* 133* 129*  K 3.9 3.7 4.0  BUN 18 16 16   CREATININE 0.80 0.67 0.75  GLUCOSE 120* 139* 124*     Recent Labs  05/04/12 0013 05/05/12 0434  INR 1.00 1.15     Assessment/Plan: 1 Day Post-Op Procedure(s) (LRB): ARTHROPLASTY BIPOLAR HIP (Left) CLOSED REDUCTION WRIST (Left)   Advance diet Up with therapy Discharge to SNF pending medical clearance  Plan to have Dr. Melvyn Novas review her wrist X-rays to determine if she needs ORIF or if she can continue to be treated conservatively

## 2012-05-05 NOTE — Progress Notes (Signed)
Clinical Social Work Department BRIEF PSYCHOSOCIAL ASSESSMENT 05/05/2012  Patient:  Joanna [name], Joanna [name]     Account Number:  [id number]     Admit date:  05/03/2012  Clinical Social Worker:  Orpah Greek  Date/Time:  05/05/2012 03:18 PM  Referred by:  Physician  Date Referred:  05/05/2012 Referred for  SNF Placement   Other Referral:   Interview type:  Patient Other interview type:   and daughter-in-law, Renea Ee    PSYCHOSOCIAL DATA Living Status:  ALONE Admitted from facility:   Level of care:   Primary support name:  Samuella Cota (daughter-in-law) h#: [phone] c#: [phone] Primary support relationship to patient:  CHILD, ADULT Degree of support available:   good    CURRENT CONCERNS Current Concerns  Post-Acute Placement   Other Concerns:    SOCIAL WORK ASSESSMENT / PLAN CSW attempted to speak with patient re: discharge planning though patient was confused. CSW spoke with patient's daughter-in-law, Renea Ee re: SNF placement.   Assessment/plan status:  Information/Referral to [facility] Other assessment/ plan:   Information/referral to community resources:   CSW completed FL2 and faxed information out to [facility] SNFs - left bed offers at bedside, will await call back from daughter-in-law with SNF decision.    PATIENT'S/FAMILY'S RESPONSE TO PLAN OF CARE: Patient's daughter-in-law is agreeable with plan for SNF, anticipating discharge tomorrow.        Unice Bailey, LCSW [facility] Clinical Social Worker cell #: [phone]

## 2012-05-05 NOTE — Op Note (Signed)
NAMEMarland Kitchen  APRILE, DICKENSON NO.:  [id number]  MEDICAL RECORD NO.:  [id number]  LOCATION:  1429                         FACILITY:  [facility]  PHYSICIAN:  Madlyn Frankel. Charlann Boxer, M.D.  DATE OF BIRTH:  [birth year]  DATE OF PROCEDURE:  05/04/2012 DATE OF DISCHARGE:                              OPERATIVE REPORT   PREOPERATIVE DIAGNOSES: 1. Displaced left femoral neck fracture. 2. Closed left distal radius fracture.  POSTOPERATIVE DIAGNOSES: 1. Displaced left femoral neck fracture. 2. Closed left distal radius fracture.  PROCEDURE: 1. Left hip hemiarthroplasty utilizing DePuy component size 4 through     a 5 standard Tri-Lock stem with a 47 unipolar ball and a +0     adapter. 2. Closed reduction of left distal radius fracture with application of     long-arm splint.  SURGEON:  Madlyn Frankel. Charlann Boxer, M.D.  ASSISTANT:  Lanney Gins, PA-C.  Note that Mr. Carmon Sails was present for the entirety of the case from preoperative position to perioperative management of the operative extremity, general facilitation of the case, and primary wound closure.  He was also involved with maintenance of fracture reduction, application of long-arm splint.  ANESTHESIA:  General.  SPECIMENS:  None.  COMPLICATIONS:  None.  DRAINS:  One medium Hemovac and the left drain.  ESTIMATED BLOOD LOSS:  From left hip procedure about 200 mL.  INDICATIONS FOR PROCEDURE:  Ms. Gartin is an 79 year old female who unfortunately slipped on some fluids on her floor landing on her left side with obvious pain, deformity, and inability to bear weight on both the left wrist and the left hip area.  She was brought to the emergency room last night where she was admitted to the Medical Service after radiographs revealed a left displaced femoral neck fracture and distal radius fracture, which were splinted in the ER in situ.  The patient was seen and evaluated as consultation in the morning, and I was asked to assist in  definitive management of her hip fracture and at least stabilize and improve alignment of the left wrist prior to consideration of other intervention.  Attention was paid to review the risks and benefits and necessity of the staged procedures.  The left hip was reviewed as was issues regarding her left distal radius fracture and the possible need for surgical intervention if there is a loss of reduction or development of a malunion.  Consent was obtained for both procedures.  PROCEDURE IN DETAIL:  The patient was brought to the operative theater. Once adequate anesthesia and preoperative antibiotics, which included 2 g of Ancef and preoperatively dosed ciprofloxacin most likely for urinary tract infection, she was positioned on the operative table.  At this point, we performed closed reduction, splint the left wrist.  Once a time-out was performed identifying the patient, planned procedure, and the first procedure in this extremity, the old splint was removed, which was basically just a volar splint.  I then performed a reduction maneuver and maintained reduction and examined the fracture reduction under fluoroscopic imaging, confirming a near anatomic reduction both in AP and lateral planes with correction of the patient's radial height inclination as well as a volar tilt.  Once  this was done, we held the arm in position and applied a long-arm splint.  I confirmed again maintenance of reduction radiographically while the splint fully set up.  Once this was done into this portion of the procedure, she was then positioned into the right lateral decubitus position with left side up. Left lower extremity was then prepped and draped in the sterile fashion. All bony prominences were adequately padded protecting the head and neck region as well as the left upper extremity.  The left lower extremity was prepped and draped in the sterile fashion. A time-out was performed identifying the  patient for this portion of the procedure and the extremity.  She had identified markings from preoperative visit as well as bruising on left hip confirming side.  An incision was made for posterior approach to the hip.  Sharp dissection was carried to the iliotibial band and gluteal fascia, which was incised for posterior approach.  The short external rotators and capsule were taken down in a separate layer identifying the fracture evacuated hematoma.  I used an oscillating saw to create a neck osteotomy into the trochanteric fossa.  The femoral head was then removed easily, measured to be 47 mm in diameter using the rings on the back table.  At this point with the hip appropriately position, the proximal femur was opened with a starting drill, hand reamed once and irrigated to try to prevent fat emboli.  I then began broach with the starting broach and broached up to a size 5 broach with good metaphyseal fit medial and laterally.  Trial reduction was carried out with standard neck, 47 ball, and 0 adapter.  I felt that her leg lengths were comparable to the down leg.  The hip was stable throughout range of motion without evidence of impingement.  Given these findings, I then removed the trial components, irrigated the canal, selected this 5 standard Tri-Lock stem, which was impacted to the level of where the broach was.  Based on this and the trial reduction, a 0 adapter was selected and impacted into the 47 unipolar ball, and a combination onto a clean and dry trunnion.  The hip was reduced.  The hip had been irrigated throughout the case again at this point.  Reapproximated posterior capsule using #1 Vicryl.  The iliotibial band and gluteal fascia were then reapproximated using #1 Vicryl.  I did place a medium Hemovac drain deep to this layer.  The remainder wound was closed with 2- 0 Vicryl and running 4-0 Monocryl.  The hip was cleaned, dried dressed sterilely using Dermabond and  Aquacel dressing.  Drain site was dressed separately.  She was then extubated and brought to recovery room in stable condition.  At this point, I allowed her to be weightbearing as tolerated in the left lower extremity.  This was the primary reason for selecting a high performance stem in this 79 year old female.  She will then bear weight through the elevated arm rest as she will bear weight through her arm but not her hand or wrist.  I will review these hand radiographs with our hand surgeons to determine the course of action.     Madlyn Frankel Charlann Boxer, M.D.     MDO/MEDQ  D:  05/04/2012  T:  05/05/2012  Job:  [id number]

## 2012-05-05 NOTE — Progress Notes (Signed)
   CARE MANAGEMENT NOTE 05/05/2012  Patient:  Joanna [name], Joanna [name]   Account Number:  [id number]  Date Initiated:  05/05/2012  Documentation initiated by:  Jiles Crocker  Subjective/Objective Assessment:   ADMITTED WITH HIP FRACTURE/ ORIF     Action/Plan:   PCP: Reather Littler, MD  LIVES ALONE; POSSIBLY NEED SHORT TERM SNF AT DISCHARGE; SOC WORKER REFERRAL PLACED   Anticipated DC Date:  05/09/2012   Anticipated DC Plan:  SKILLED NURSING FACILITY  In-house referral  Clinical Social Worker      DC Planning Services  CM consult             Status of service:  In process, will continue to follow Medicare Important Message given?  NA - LOS <3 / Initial given by admissions (If response is "NO", the following Medicare IM given date fields will be blank) Per UR Regulation:  Reviewed for med. necessity/level of care/duration of stay Comments:  05/05/2012- B Hurley Blevins RN,BSN,MHA

## 2012-05-05 NOTE — Progress Notes (Signed)
[facility] PROGRESS NOTE  LAVERLE PILLARD ZOX:[id number] DOB: Dec 30, 1928 DOA: 05/03/2012 PCP: Reather Littler, MD  Assessment/Plan:   1. Left Hip Fracture and Fracture of distal radius and Ulna- Orthopedics on board.  S/p hemiarthroplasty.  2. Fall due to Stumbling- accidental.  3. Hypothyroid- on Levothyroxine,. TSH with in normal limits.  4. HTN- resume home medications.  5. COPD- stable Nebs PRN. Monitor O2 sats.  6. Anemia- post op anemia. Will continue to monitor.  7. Hyponatremia-worsened. Will get urine electrolytes and d/c IV fluids.  8. DVT prophylaxis  ON coumadin.  9. Pseudomonas UTI: ON iv ciprofloxacin awaiting sensitivities back.    PROCEDURE: Procedure(s) with comments:  1. Left hip hemiARTHROPLASTY - Depuy, 5 std, 47+0  2. CLOSED REDUCTION WRIST (Left) - application long arm splint   Consultants: Orthopedics.  HPI/Subjective: Complaining of pain.   Objective: Filed Vitals:   05/05/12 0754 05/05/12 1200 05/05/12 1402 05/05/12 1600  BP:   151/55   Pulse:   91   Temp:   99.2 F (37.3 C)   TempSrc:   Oral   Resp: 20 18 18 18   Height:      Weight:      SpO2:   93% 95%    Intake/Output Summary (Last 24 hours) at 05/05/12 1841 Last data filed at 05/05/12 1500  Gross per 24 hour  Intake   1910 ml  Output   1170 ml  Net    740 ml   Filed Weights   05/04/12 0433  Weight: 61.326 kg (135 lb 3.2 oz)    Exam:  CHEST: Normal respiration, clear to auscultation bilaterally  HEART: Regular rate and rhythm; no murmurs rubs or gallops  BACK: No kyphosis or scoliosis; no CVA tenderness  ABDOMEN: Positive Bowel Sounds, soft non-tender; no masses, no organomegaly.  EXTREMITIES: No cyanosis, clubbing or edema; no ulcerations   Data Reviewed: Basic Metabolic Panel:  Recent Labs Lab 05/04/12 0013 05/04/12 0552 05/05/12 0434  NA 132* 133* 129*  K 3.9 3.7 4.0  CL 96 98 99  CO2 27 24 24   GLUCOSE 120* 139* 124*  BUN 18 16 16   CREATININE 0.80 0.67 0.75   CALCIUM 9.0 8.7 8.3*   Liver Function Tests: No results found for this basename: AST, ALT, ALKPHOS, BILITOT, PROT, ALBUMIN,  in the last 168 hours No results found for this basename: LIPASE, AMYLASE,  in the last 168 hours No results found for this basename: AMMONIA,  in the last 168 hours CBC:  Recent Labs Lab 05/04/12 0013 05/04/12 0552 05/05/12 0434  WBC 7.9 15.6* 12.5*  NEUTROABS 5.2  --   --   HGB 11.9* 11.4* 9.2*  HCT 34.4* 33.0* 26.8*  MCV 84.9 84.4 85.6  PLT 244 247 188   Cardiac Enzymes: No results found for this basename: CKTOTAL, CKMB, CKMBINDEX, TROPONINI,  in the last 168 hours BNP (last 3 results) No results found for this basename: PROBNP,  in the last 8760 hours CBG: No results found for this basename: GLUCAP,  in the last 168 hours  Recent Results (from the past 240 hour(s))  URINE CULTURE     Status: None   Collection Time    05/04/12 12:29 PM      Result Value Range Status   Specimen Description URINE, CATHETERIZED   Final   Special Requests NONE   Final   Culture  Setup Time 05/04/2012 19:04   Final   Colony Count >=100,000 COLONIES/ML   Final   Culture PSEUDOMONAS  AERUGINOSA   Final   Report Status PENDING   Incomplete  SURGICAL PCR SCREEN     Status: None   Collection Time    05/04/12 12:30 PM      Result Value Range Status   MRSA, PCR NEGATIVE  NEGATIVE Final   Staphylococcus aureus NEGATIVE  NEGATIVE Final   Comment:            The Xpert SA Assay (FDA     approved for NASAL specimens     in patients over 20 years of age),     is one component of     a comprehensive surveillance     program.  Test performance has     been validated by [employer] for patients greater     than or equal to 68 year old.     It is not intended     to diagnose infection nor to     guide or monitor treatment.     Studies: Dg Chest 1 View  05/04/2012  *RADIOLOGY REPORT*  Clinical Data: Status post fall; history of asthma.  Concern for chest injury.   CHEST - 1 VIEW  Comparison: Chest radiograph performed 02/23/2011, and CTA of the chest performed 03/27/2010  Findings: The lungs are well-aerated.  Mild chronically increased interstitial markings are seen.  Bilateral densities reflect overlying soft tissues.  There is no evidence of focal opacification, pleural effusion or pneumothorax.  The cardiomediastinal silhouette is normal in size.  Calcification is noted within the aortic arch.  No acute osseous abnormalities are seen.  IMPRESSION: Mild chronic lung changes noted.  No acute cardiopulmonary process seen.  No displaced rib fractures identified.   Original Report Authenticated By: Tonia Ghent, M.D.    Dg Forearm Left  05/04/2012  *RADIOLOGY REPORT*  Clinical Data: Distal forearm pain and bruising post fall  LEFT FOREARM - 2 VIEW  Comparison: None  Findings: Minimally distracted ulnar styloid fracture. Transverse metaphyseal fracture distal left radius, minimally comminuted, with apex volar angulation. No definite intrarticular extension. Diffuse osseous demineralization. No additional fracture, dislocation or bone destruction.  IMPRESSION: Displaced ulnar styloid fracture. Mildly displaced and angulated comminuted distal left radial metaphyseal fracture.   Original Report Authenticated By: Ulyses Southward, M.D.    Dg Hip Complete Left  05/04/2012  *RADIOLOGY REPORT*  Clinical Data: Hip pain.  LEFT HIP - COMPLETE 2+ VIEW  Comparison: None.  Findings: There appears to be a mildly displaced transcervical fracture of the left femoral neck, difficult to fully characterize. The left femoral head remains seated at the acetabulum.  The right hip joint is unremarkable in appearance.  Mild degenerative change is noted at the lower lumbar spine.  The sacroiliac joints are unremarkable in appearance.  The visualized bowel gas pattern is grossly unremarkable in appearance.  Scattered phleboliths are noted within the pelvis.  IMPRESSION: Mildly displaced transcervical  fracture of the left femoral neck.   Original Report Authenticated By: Tonia Ghent, M.D.    Dg Pelvis Portable  05/04/2012  *RADIOLOGY REPORT*  Clinical Data: Postop left hip surgery.  PORTABLE PELVIS  Comparison: 04/03/2012  Findings: Changes of left hip replacement.  Soft tissue drain in place.  Normal AP alignment.  No hardware or bony complicating feature.  Mild degenerative changes in the right hip.  IMPRESSION: Left hip replacement.  No complicating feature.   Original Report Authenticated By: Charlett Nose, M.D.     Scheduled Meds: . albuterol  2 puff Inhalation  NOW  . amLODipine  2.5 mg Oral q morning - 10a  . ciprofloxacin  400 mg Intravenous Q12H  . enoxaparin (LOVENOX) injection  40 mg Subcutaneous Q24H  . feeding supplement  237 mL Oral BID BM  . ferrous sulfate  325 mg Oral TID PC  . levothyroxine  62.5 mcg Oral QHS  . losartan  50 mg Oral q morning - 10a  . metoprolol tartrate  50 mg Oral QHS  . Warfarin - Pharmacist Dosing Inpatient   Does not apply q1800   Continuous Infusions: . sodium chloride 75 mL/hr at 05/05/12 1610    Principal Problem:   Closed left hip fracture Active Problems:   HYPOTHYROIDISM   HYPERTENSION   COPD   Fracture of radial shaft, with ulna, left, closed   Fall due to stumbling   Anemia   Hyponatremia        Jahleah Mariscal  [facility] Pager [phone] If 7PM-7AM, please contact night-coverage at [email or web address], password [facility] 05/05/2012, 6:41 PM  LOS: 2 days

## 2012-05-05 NOTE — Progress Notes (Signed)
ANTICOAGULATION CONSULT NOTE - Follow Up  Pharmacy Consult for Warfarin Indication: VTE prophylaxis  Allergies  Allergen Reactions  . Cefuroxime Axetil Hives  . Lactose Intolerance (Gi) Other (See Comments)    Gi upset  . Prednisone Other (See Comments)    Pt unsure of reaction  . Clarithromycin Palpitations  . Codeine Palpitations  . Sulfonamide Derivatives Palpitations    Patient Measurements: Height: 5\' 7"  (170.2 cm) Weight: 135 lb 3.2 oz (61.326 kg) IBW/kg (Calculated) : 61.6  Vital Signs: Temp: 98.5 F (36.9 C) (04/21 0524) Temp src: Oral (04/21 0524) BP: 159/59 mmHg (04/21 0524) Pulse Rate: 83 (04/21 0524)  Labs:  Recent Labs  05/04/12 0013 05/04/12 0552 05/05/12 0434  HGB 11.9* 11.4* 9.2*  HCT 34.4* 33.0* 26.8*  PLT 244 247 188  LABPROT 13.1  --  14.5  INR 1.00  --  1.15  CREATININE 0.80 0.67 0.75    Estimated Creatinine Clearance: 53.4 ml/min (by C-G formula based on Cr of 0.75).   Anticoag or Interacting Medications:  Cipro 400mg  IV q12h Lovenox 40mg  SQ q24h  Assessment: 79 yo female s/p fall. Now s/p left hip hemiarthroplasty and left wrist closed reduction.  To be warfarin for post-op VTE prophylaxis. RN note from this am indicates patient is profoundly confused.  INR subtherapeutic as expected after only one dose of warfarin.  CBC ok but trending down  No bleeding reported in chart notes  Potential drug interaction with cipro (mild)  Note patient is on aspirin 81mg  day at home  Goal of Therapy:  INR 2-3 Monitor platelets by anticoagulation protocol: Yes   Plan:   Repeat warfarin 4mg  po x 1 today  Daily PT/INR  Lovenox 40mg  sq q24h until INR >/= 1.8 per MD  Provide warfarin education prior to discharge if patient no longer confused  Darrol Angel, PharmD Pager: [phone] 05/05/2012,8:37 AM

## 2012-05-05 NOTE — Progress Notes (Signed)
Remains grossly confused, found standing at bedside at bottom of bed with bed alarm going off. Very paranoid now and thinks staff are trying to harm her. Unable to reason with her despite multiple efforts. Patient very angry at staff. Family called and patient spoke with both son and daughter in law Renea Ee) but without good results-remains angry, paranoid and grossly confused. Safety sitter placed in room at this time to assist with keeping patient in the bed, safe and from harming self. Continue to monitor. Ginny Forth

## 2012-05-05 NOTE — Progress Notes (Signed)
Awoke grossly confused, pulled out IV and foley catheter (with balloon intact). Rambling speech and asking questions about why doors are open/closed, possibly hallucinating. Gets angry with staff when attempts to reorient. Somewhat agitated. Will give some time prior to attempting to restart IV. BP and vital signs stable. Will continue to monitor.  Ginny Forth

## 2012-05-05 NOTE — Progress Notes (Signed)
Clinical Social Work Department CLINICAL SOCIAL WORK PLACEMENT NOTE 05/05/2012  Patient:  Joanna [name], Joanna [name]  Account Number:  [id number] Admit date:  05/03/2012  Clinical Social Worker:  Orpah Greek  Date/time:  05/05/2012 03:21 PM  Clinical Social Work is seeking post-discharge placement for this patient at the following level of care:   SKILLED NURSING   (*CSW will update this form in Epic as items are completed)   05/05/2012  Patient/family provided with [facility] Department of Clinical Social Work's list of facilities offering this level of care within the geographic area requested by the patient (or if unable, by the patient's family).  05/05/2012  Patient/family informed of their freedom to choose among providers that offer the needed level of care, that participate in Medicare, Medicaid or managed care program needed by the patient, have an available bed and are willing to accept the patient.  05/05/2012  Patient/family informed of [facility]' ownership interest in [facility], as well as of the fact that they are under no obligation to receive care at this facility.  PASARR submitted to EDS on 05/05/2012 PASARR number received from EDS on 05/05/2012  FL2 transmitted to all facilities in geographic area requested by pt/family on  05/05/2012 FL2 transmitted to all facilities within larger geographic area on   Patient informed that his/her managed care company has contracts with or will negotiate with  certain facilities, including the following:     Patient/family informed of bed offers received:  05/05/2012 Patient chooses bed at  Physician recommends and patient chooses bed at    Patient to be transferred to  on   Patient to be transferred to facility by   The following physician request were entered in Epic:   Additional Comments:  Unice Bailey, LCSW [facility] Clinical Social Worker cell #: [phone]

## 2012-05-05 NOTE — Progress Notes (Signed)
Spoke with son (?Jillyn Hidden or ?Tammy Sours) who called me and he is asking if patient can be given something to calm herself down. Son informed that patient paranoid and won't take any medicine from this RN and that IV access is gone and would have to hold patient down to get IV access back at present time-he is not favorable to that. Son informed of Recruitment consultant in room and then states "I guess there's no rush for me to get up there then, is there"? This RN informed son it was up to him as to when he comes to see his mother. Continue safety sitter on upcoming day shift and continue monitoring. Ginny Forth

## 2012-05-05 NOTE — Progress Notes (Addendum)
INITIAL NUTRITION ASSESSMENT  DOCUMENTATION CODES Per approved criteria  -Not Applicable   INTERVENTION: - Ensure Complete BID - Encouraged increased meal intake - Downgraded diet to dysphagia 3, thin per pt request - RD to monitor intake  NUTRITION DIAGNOSIS: Inadequate oral intake related to poor appetite with confusion as evidenced by <50% meal intake.   Goal: Pt to consume >90% of meals/supplements.  Monitor:  Weights, labs, intake  Reason for Assessment: Nutrition risk   79 y.o. female  Admitting Dx: Closed left hip fracture  ASSESSMENT: Pt admitted with left hip and wrist pain after falling, POD# 1 surgical repair of these issues. Noted pt with confusion and paranoia post-op, has safety sitter at bedside. Pt appeared to be having appropriate phone conversation prior to conversation. Pt from home. Met with pt who reports eating well PTA, 3 meals/day. Pt denies any problems swallowing but states sometimes she has problems chewing r/t having a broken tooth. Pt unsure of any weight changes. Pt reports she has been on Ensure in the past. Noted pt ate only 1 bowl of cereal for breakfast, nothing else. Pt not wanting to order lunch right now.   Height: Ht Readings from Last 1 Encounters:  05/04/12 5\' 7"  (1.702 m)    Weight: Wt Readings from Last 1 Encounters:  05/04/12 135 lb 3.2 oz (61.326 kg)    Ideal Body Weight: 135 lb  % Ideal Body Weight: 100  Wt Readings from Last 10 Encounters:  05/04/12 135 lb 3.2 oz (61.326 kg)  05/04/12 135 lb 3.2 oz (61.326 kg)  02/19/12 130 lb 6.4 oz (59.149 kg)  01/08/12 134 lb 6.4 oz (60.963 kg)  10/16/11 135 lb 9.6 oz (61.508 kg)  06/12/11 139 lb 3.2 oz (63.141 kg)  03/13/11 138 lb 9.6 oz (62.869 kg)  02/23/11 137 lb (62.143 kg)  01/25/11 136 lb 3.2 oz (61.78 kg)  12/27/10 143 lb 3.2 oz (64.955 kg)    Usual Body Weight: 135 lb  % Usual Body Weight: 100  BMI:  Body mass index is 21.17 kg/(m^2).  Estimated Nutritional  Needs: Kcal: 1700-1850 Protein: 75-90g Fluid: 1.7-1.8L/day  Skin: Left hip incision, left wrist incision  Diet Order: General  EDUCATION NEEDS: -No education needs identified at this time   Intake/Output Summary (Last 24 hours) at 05/05/12 1202 Last data filed at 05/05/12 1024  Gross per 24 hour  Intake   2570 ml  Output   1420 ml  Net   1150 ml    Last BM: 4/19  Labs:   Recent Labs Lab 05/04/12 0013 05/04/12 0552 05/05/12 0434  NA 132* 133* 129*  K 3.9 3.7 4.0  CL 96 98 99  CO2 27 24 24   BUN 18 16 16   CREATININE 0.80 0.67 0.75  CALCIUM 9.0 8.7 8.3*  GLUCOSE 120* 139* 124*    CBG (last 3)  No results found for this basename: GLUCAP,  in the last 72 hours  Scheduled Meds: . albuterol  2 puff Inhalation NOW  . amLODipine  2.5 mg Oral q morning - 10a  . ciprofloxacin  400 mg Intravenous Q12H  . enoxaparin (LOVENOX) injection  40 mg Subcutaneous Q24H  . ferrous sulfate  325 mg Oral TID PC  . levothyroxine  62.5 mcg Oral QHS  . losartan  50 mg Oral q morning - 10a  . metoprolol  2.5 mg Intravenous Q6H  . warfarin  4 mg Oral ONCE-1800  . warfarin   Does not apply Once  . Warfarin -  Pharmacist Dosing Inpatient   Does not apply q1800    Continuous Infusions: . sodium chloride 75 mL/hr at 05/05/12 0748    Past Medical History  Diagnosis Date  . IBS (irritable bowel syndrome)   . Hypothyroid   . GERD (gastroesophageal reflux disease)   . Hypertension   . Allergic rhinitis   . Asthma     Past Surgical History  Procedure Laterality Date  . Mastectomy      double  . Bladder surgery    . Nasal sinus surgery       Levon Hedger MS, RD, LDN [phone] Pager [phone] After Hours Pager

## 2012-05-06 ENCOUNTER — Inpatient Hospital Stay (HOSPITAL_COMMUNITY): Payer: Medicare Other

## 2012-05-06 LAB — SODIUM, URINE, RANDOM: Sodium, Ur: 96 mEq/L

## 2012-05-06 LAB — URINALYSIS, ROUTINE W REFLEX MICROSCOPIC
Bilirubin Urine: NEGATIVE
Ketones, ur: NEGATIVE mg/dL
Nitrite: NEGATIVE
Specific Gravity, Urine: 1.015 (ref 1.005–1.030)
Urobilinogen, UA: 1 mg/dL (ref 0.0–1.0)
pH: 7 (ref 5.0–8.0)

## 2012-05-06 LAB — CBC
Hemoglobin: 9.4 g/dL — ABNORMAL LOW (ref 12.0–15.0)
Platelets: 171 10*3/uL (ref 150–400)
RBC: 3.19 MIL/uL — ABNORMAL LOW (ref 3.87–5.11)
WBC: 8.2 10*3/uL (ref 4.0–10.5)

## 2012-05-06 LAB — URINE CULTURE: Colony Count: >=100000

## 2012-05-06 LAB — BASIC METABOLIC PANEL
CO2: 24 mEq/L (ref 19–32)
Calcium: 8.1 mg/dL — ABNORMAL LOW (ref 8.4–10.5)
Chloride: 93 mEq/L — ABNORMAL LOW (ref 96–112)
Potassium: 3 mEq/L — ABNORMAL LOW (ref 3.5–5.1)
Sodium: 125 mEq/L — ABNORMAL LOW (ref 135–145)

## 2012-05-06 LAB — OSMOLALITY: Osmolality: 259 mOsm/kg — ABNORMAL LOW (ref 275–300)

## 2012-05-06 LAB — PROTIME-INR
INR: 1.09 (ref 0.00–1.49)
Prothrombin Time: 14 seconds (ref 11.6–15.2)

## 2012-05-06 LAB — CREATININE, URINE, RANDOM: Creatinine, Urine: 42.7 mg/dL

## 2012-05-06 MED ORDER — POTASSIUM CHLORIDE 10 MEQ/100ML IV SOLN
10.0000 meq | INTRAVENOUS | Status: AC
  Administered 2012-05-06 (×3): 10 meq via INTRAVENOUS
  Filled 2012-05-06 (×3): qty 100

## 2012-05-06 MED ORDER — LORAZEPAM 0.5 MG PO TABS
0.2500 mg | ORAL_TABLET | Freq: Once | ORAL | Status: AC
  Administered 2012-05-06: 0.25 mg via ORAL
  Filled 2012-05-06: qty 1

## 2012-05-06 MED ORDER — SODIUM CHLORIDE 0.9 % IV SOLN
INTRAVENOUS | Status: DC
  Administered 2012-05-06: 10:00:00 via INTRAVENOUS

## 2012-05-06 MED ORDER — WARFARIN SODIUM 7.5 MG PO TABS
7.5000 mg | ORAL_TABLET | Freq: Once | ORAL | Status: AC
  Administered 2012-05-06: 7.5 mg via ORAL
  Filled 2012-05-06: qty 1

## 2012-05-06 MED ORDER — PIPERACILLIN-TAZOBACTAM 3.375 G IVPB
3.3750 g | Freq: Three times a day (TID) | INTRAVENOUS | Status: DC
  Administered 2012-05-06 – 2012-05-07 (×3): 3.375 g via INTRAVENOUS
  Filled 2012-05-06 (×5): qty 50
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
## 2012-05-06 NOTE — Progress Notes (Signed)
[facility] PROGRESS NOTE  Joanna [name]:[id number] DOB: 03-15-1928 DOA: 05/03/2012 PCP: Reather Littler, MD  Assessment/Plan:   1. Left Hip Fracture and Fracture of distal radius and Ulna- Orthopedics on board.  S/p hemiarthroplasty of the bipolar hip on the left side and closed reduction of the left wrist. Pain control and IV fluids. PT /OT evaluation recommended SNF placement. Coumadin for dvt prophylaxis.   2. Fall due to Stumbling- accidental.   3. Hypothyroid- on Levothyroxine,. TSH with in normal limits.   4. HTN- resume home medications. PRN hydralazine for BP > 180/100 mmhg.   5. COPD- stable Nebs PRN. Monitor O2 sats.   6. Anemia- post op  Blood loss anemia. H&H on admission was 11.4/33, dropped to 9.2/26.6 and stabilized at 9.2 to 9.4. Will continue to monitor.   7. Hyponatremia and hypokalemia: worsened. Differential include pos operative,vs pain vs dehydration vs SIADH. Her serum osmolality is  259, TSH is within normal limits. Urine sodium is 96, probably SIADH, will put her on fluid restriction to per day. Repeat electrolytes in am. Replete potassium as needed. Will obtain a magnesium level.   9. Pseudomonas UTI: ON iv ciprofloxacin awaiting sensitivities back. But she has a fever today to 100.7, will d/c cipro and start her on zosyn.   9. DVT prophylaxis : she is on both lovenox and coumadin. Her platelets have dropped from 244 to 171. Continue to monitor.    PROCEDURE: Procedure(s) with comments:  1. Left hip hemiARTHROPLASTY - Depuy, 5 std, 47+0  2. CLOSED REDUCTION WRIST (Left) - application long arm splint   Consultants: Orthopedics.  HPI/Subjective: Complaining of pain.   Objective: Filed Vitals:   05/05/12 2121 05/06/12 0000 05/06/12 0400 05/06/12 0609  BP: 172/62   156/61  Pulse: 101   89  Temp: 99.8 F (37.7 C)   100.7 F (38.2 C)  TempSrc: Oral   Oral  Resp: 16 16 16 12   Height:      Weight:      SpO2: 96% 96% 100% 95%     Intake/Output Summary (Last 24 hours) at 05/06/12 1206 Last data filed at 05/06/12 0900  Gross per 24 hour  Intake   1660 ml  Output   1400 ml  Net    260 ml   Filed Weights   05/04/12 0433  Weight: 61.326 kg (135 lb 3.2 oz)    Exam:  CHEST: Normal respiration, clear to auscultation bilaterally  HEART: Regular rate and rhythm; no murmurs rubs or gallops  BACK: No kyphosis or scoliosis; no CVA tenderness  ABDOMEN: Positive Bowel Sounds, soft non-tender; no masses, no organomegaly.  EXTREMITIES: No cyanosis, clubbing or edema; no ulcerations   Data Reviewed: Basic Metabolic Panel:  Recent Labs Lab 05/04/12 0013 05/04/12 0552 05/05/12 0434 05/06/12 0435  NA 132* 133* 129* 125*  K 3.9 3.7 4.0 3.0*  CL 96 98 99 93*  CO2 27 24 24 24   GLUCOSE 120* 139* 124* 105*  BUN 18 16 16 10   CREATININE 0.80 0.67 0.75 0.61  CALCIUM 9.0 8.7 8.3* 8.1*   Liver Function Tests: No results found for this basename: AST, ALT, ALKPHOS, BILITOT, PROT, ALBUMIN,  in the last 168 hours No results found for this basename: LIPASE, AMYLASE,  in the last 168 hours No results found for this basename: AMMONIA,  in the last 168 hours CBC:  Recent Labs Lab 05/04/12 0013 05/04/12 0552 05/05/12 0434 05/06/12 0435  WBC 7.9 15.6* 12.5* 8.2  NEUTROABS 5.2  --   --   --  HGB 11.9* 11.4* 9.2* 9.4*  HCT 34.4* 33.0* 26.8* 26.6*  MCV 84.9 84.4 85.6 83.4  PLT 244 247 188 171   Cardiac Enzymes: No results found for this basename: CKTOTAL, CKMB, CKMBINDEX, TROPONINI,  in the last 168 hours BNP (last 3 results) No results found for this basename: PROBNP,  in the last 8760 hours CBG: No results found for this basename: GLUCAP,  in the last 168 hours  Recent Results (from the past 240 hour(s))  URINE CULTURE     Status: None   Collection Time    05/04/12 12:29 PM      Result Value Range Status   Specimen Description URINE, CATHETERIZED   Final   Special Requests NONE   Final   Culture  Setup  Time 05/04/2012 19:04   Final   Colony Count >=100,000 COLONIES/ML   Final   Culture PSEUDOMONAS AERUGINOSA   Final   Report Status PENDING   Incomplete  SURGICAL PCR SCREEN     Status: None   Collection Time    05/04/12 12:30 PM      Result Value Range Status   MRSA, PCR NEGATIVE  NEGATIVE Final   Staphylococcus aureus NEGATIVE  NEGATIVE Final   Comment:            The Xpert SA Assay (FDA     approved for NASAL specimens     in patients over 60 years of age),     is one component of     a comprehensive surveillance     program.  Test performance has     been validated by [employer] for patients greater     than or equal to 49 year old.     It is not intended     to diagnose infection nor to     guide or monitor treatment.     Studies: Dg Pelvis Portable  05/04/2012  *RADIOLOGY REPORT*  Clinical Data: Postop left hip surgery.  PORTABLE PELVIS  Comparison: 04/03/2012  Findings: Changes of left hip replacement.  Soft tissue drain in place.  Normal AP alignment.  No hardware or bony complicating feature.  Mild degenerative changes in the right hip.  IMPRESSION: Left hip replacement.  No complicating feature.   Original Report Authenticated By: Charlett Nose, M.D.     Scheduled Meds: . amLODipine  2.5 mg Oral q morning - 10a  . ciprofloxacin  400 mg Intravenous Q12H  . enoxaparin (LOVENOX) injection  40 mg Subcutaneous Q24H  . feeding supplement  237 mL Oral BID BM  . ferrous sulfate  325 mg Oral TID PC  . levothyroxine  62.5 mcg Oral QHS  . losartan  50 mg Oral q morning - 10a  . metoprolol tartrate  50 mg Oral QHS  . potassium chloride  10 mEq Intravenous Q1 Hr x 3  . warfarin  7.5 mg Oral ONCE-1800  . Warfarin - Pharmacist Dosing Inpatient   Does not apply q1800   Continuous Infusions: . sodium chloride 75 mL/hr at 05/06/12 1028    Principal Problem:   Closed left hip fracture Active Problems:   HYPOTHYROIDISM   HYPERTENSION   COPD   Fracture of radial shaft,  with ulna, left, closed   Fall due to stumbling   Anemia   Hyponatremia        Jamerica Snavely  [facility] Pager [phone] If 7PM-7AM, please contact night-coverage at [email or web address], password [facility] 05/06/2012, 12:06 PM  LOS: 3 days

## 2012-05-06 NOTE — Progress Notes (Addendum)
Physical Therapy Treatment Patient Details Name: Joanna [name] MRN: [id number] DOB: 1928/02/15 Today's Date: 05/06/2012 Time: 5621-3086 PT Time Calculation (min): 8 min  PT Assessment / Plan / Recommendation Comments on Treatment Session  Assisted pt back to bed. Continues to require +2 assist. Recommend SNF.     Follow Up Recommendations  SNF     Does the patient have the potential to tolerate intense rehabilitation     Barriers to Discharge        Equipment Recommendations  None recommended by PT    Recommendations for Other Services OT consult  Frequency Min 3X/week   Plan Discharge plan remains appropriate    Precautions / Restrictions Precautions Precautions: Fall;Posterior Hip Precaution Comments: WBAT LLE.  NWB R hand/wrist. Pt can use platform Restrictions Weight Bearing Restrictions: Yes LUE Weight Bearing: Non weight bearing (through hand/wrist) LLE Weight Bearing: Weight bearing as tolerated Other Position/Activity Restrictions: L UE WBAT allowed through prox forearm/elbow with PFRW   Pertinent Vitals/Pain 9/10 L hip, L UE    Mobility  Bed Mobility Bed Mobility: Sit to Supine Sit to Supine: 1: +2 Total assist 10% Details for Bed Mobility Assistance: Multimodal cues for safety, technique, hand placement, NWB L UE. Assist for trunk to upright and bil LEs onto bed. Utilized bed pad to assist. Increased time.  Transfers Transfers: Sit to Stand;Stand to Sit;Stand Pivot Transfers Sit to Stand: 1: +2 Total assist;From chair/3-in-1 Sit to Stand: Patient Percentage: 30% Stand to Sit: 1: +2 Total assist;To elevated surface;To bed Stand to Sit: Patient Percentage: 30% Stand Pivot Transfers: 1: +2 Total assist Stand Pivot Transfers: Patient Percentage: 40% Details for Transfer Assistance: Multimodal cues for safety, technqiue, hand placement, L LE placement. Assist  to rise, stabilize, control descent, for L LE/UE placment, maneuver walker. Increased time.     Exercises     PT Diagnosis: Difficulty walking;Abnormality of gait;Generalized weakness;Acute pain  PT Problem List: Decreased strength;Decreased activity tolerance;Decreased mobility;Decreased balance;Decreased range of motion;Decreased knowledge of use of DME;Decreased knowledge of precautions;Pain PT Treatment Interventions: DME instruction;Gait training;Functional mobility training;Therapeutic activities;Therapeutic exercise;Balance training;Patient/family education   PT Goals Acute Rehab PT Goals PT Goal Formulation: With patient Time For Goal Achievement: 05/20/12 Potential to Achieve Goals: Good Pt will go Supine/Side to Sit: with mod assist PT Goal: Supine/Side to Sit - Progress: Goal set today Pt will go Sit to Supine/Side: with mod assist PT Goal: Sit to Supine/Side - Progress: Progressing toward goal Pt will go Sit to Stand: with mod assist PT Goal: Sit to Stand - Progress: Progressing toward goal Pt will Transfer Bed to Chair/Chair to Bed: with mod assist PT Transfer Goal: Bed to Chair/Chair to Bed - Progress: Progressing toward goal Pt will Ambulate: 1 - 15 feet;with mod assist;with rolling walker PT Goal: Ambulate - Progress: Goal set today  Visit Information  Last PT Received On: 05/06/12 Assistance Needed: +2    Subjective Data  Subjective: That's all I can do! Patient Stated Goal: Less pain. Regain independence   Cognition  Cognition Arousal/Alertness: Awake/alert Behavior During Therapy: WFL for tasks assessed/performed Overall Cognitive Status: Within Functional Limits for tasks assessed    Balance  Balance Balance Assessed: Yes Static Sitting Balance Static Sitting - Level of Assistance: 4: Min assist Static Standing Balance Static Standing - Balance Support: Right upper extremity supported Static Standing - Comment/# of Minutes: Sat EOB ~7-8 minutes-level of assist decreased over time.   End of Session PT - End of Session Activity Tolerance: Patient  limited by  pain;Patient limited by fatigue Patient left: in bed;with call bell/phone within reach   GP     Rebeca Alert, MPT Pager: [phone]

## 2012-05-06 NOTE — Progress Notes (Signed)
ANTIBIOTIC CONSULT NOTE - INITIAL  Pharmacy Consult for Zosyn Indication: Pseudomonas UTI  Allergies  Allergen Reactions  . Cefuroxime Axetil Hives  . Lactose Intolerance (Gi) Other (See Comments)    Gi upset  . Prednisone Other (See Comments)    Pt unsure of reaction  . Clarithromycin Palpitations  . Codeine Palpitations  . Sulfonamide Derivatives Palpitations    Patient Measurements: Height: 5\' 7"  (170.2 cm) Weight: 135 lb 3.2 oz (61.326 kg) IBW/kg (Calculated) : 61.6  Vital Signs: Temp: 100.7 F (38.2 C) (04/22 0609) Temp src: Oral (04/22 0609) BP: 156/61 mmHg (04/22 0609) Pulse Rate: 89 (04/22 0609)  Labs:  Recent Labs  05/04/12 0552 05/05/12 0434 05/05/12 2125 05/06/12 0435  WBC 15.6* 12.5*  --  8.2  HGB 11.4* 9.2*  --  9.4*  PLT 247 188  --  171  LABCREA  --   --  42.7  --   CREATININE 0.67 0.75  --  0.61   Estimated Creatinine Clearance: 53.4 ml/min (by C-G formula based on Cr of 0.61).    Microbiology: Recent Results (from the past 720 hour(s))  URINE CULTURE     Status: None   Collection Time    05/04/12 12:29 PM      Result Value Range Status   Specimen Description URINE, CATHETERIZED   Final   Special Requests NONE   Final   Culture  Setup Time 05/04/2012 19:04   Final   Colony Count >=100,000 COLONIES/ML   Final   Culture PSEUDOMONAS AERUGINOSA   Final   Report Status PENDING   Incomplete  SURGICAL PCR SCREEN     Status: None   Collection Time    05/04/12 12:30 PM      Result Value Range Status   MRSA, PCR NEGATIVE  NEGATIVE Final   Staphylococcus aureus NEGATIVE  NEGATIVE Final   Comment:            The Xpert SA Assay (FDA     approved for NASAL specimens     in patients over 58 years of age),     is one component of     a comprehensive surveillance     program.  Test performance has     been validated by [employer] for patients greater     than or equal to 22 year old.     It is not intended     to diagnose infection nor to      guide or monitor treatment.    Medical History: Past Medical History  Diagnosis Date  . IBS (irritable bowel syndrome)   . Hypothyroid   . GERD (gastroesophageal reflux disease)   . Hypertension   . Allergic rhinitis   . Asthma     Medications:  Anti-infectives   Start     Dose/Rate Route Frequency Ordered Stop   05/04/12 1845  clindamycin (CLEOCIN) IVPB 600 mg     600 mg 100 mL/hr over 30 Minutes Intravenous Every 6 hours 05/04/12 1809 05/05/12 0018   05/04/12 1300  ciprofloxacin (CIPRO) IVPB 400 mg  Status:  Discontinued     400 mg 200 mL/hr over 60 Minutes Intravenous Every 12 hours 05/04/12 1200 05/06/12 1241     Assessment: 79 yo F on Day #3 Cipro for pseudomonas UTI. Sensitivities still pending, but since patient spiked a fever (100.7), plan is to change to Zosyn now. CrCl>13ml/min.  Plan:  1) Zosyn 3.375g IV q8h (EI) 2) F/U urine  sensitivities  Darrol Angel, PharmD Pager: [phone] 05/06/2012,1:03 PM

## 2012-05-06 NOTE — Evaluation (Signed)
Physical Therapy Evaluation Patient Details Name: Joanna [name] MRN: [id number] DOB: 1928/03/30 Today's Date: 05/06/2012 Time: 0950-1010 PT Time Calculation (min): 20 min  PT Assessment / Plan / Recommendation Clinical Impression  79 yo female s/p L hip hemi and L distal radius fx-closed reduction. On eval, pt required +2 assist for bed mobility and transfer. Mod-Max encouragment for participation. Pt became anxious during session. Recommend SNF for continued rehab to improve strength, activity tolerance, gait and balance to regain independence.     PT Assessment  Patient needs continued PT services    Follow Up Recommendations  SNF    Does the patient have the potential to tolerate intense rehabilitation      Barriers to Discharge        Equipment Recommendations  None recommended by PT    Recommendations for Other Services OT consult   Frequency Min 3X/week    Precautions / Restrictions Precautions Precautions: Fall;Posterior Hip Precaution Comments: long cast L UE Restrictions Weight Bearing Restrictions: Yes LUE Weight Bearing: Non weight bearing through hand/wrist LLE Weight Bearing: Weight bearing as tolerated Other Position/Activity Restrictions: L UE WBAT allowed through prox forearm/elbow with PFRW   Pertinent Vitals/Pain 7/10 L hip, L UE      Mobility  Bed Mobility Bed Mobility: Supine to Sit Supine to Sit: 1: +2 Total assist Supine to Sit: Patient Percentage: 30% Details for Bed Mobility Assistance: Multimodal cues for safety, technique, hand placement, NWB L UE. Assist for trunk to upright and bil LEs onto bed. Utilzed bed pad to assist. Increased time.  Transfers Transfers: Sit to Stand;Stand to Sit;Stand Pivot Transfers Sit to Stand: 1: +2 Total assist;From elevated surface;From bed Sit to Stand: Patient Percentage: 40% Stand to Sit: 1: +2 Total assist;To chair/3-in-1 Stand to Sit: Patient Percentage: 30% Stand Pivot Transfers: 1: +2 Total  assist Stand Pivot Transfers: Patient Percentage: 40% Details for Transfer Assistance: Multimodal cues for safety, technqiue, hand placement, L LE placement. Assist  to rise, stabilize, control descent, for L LE/UE placment, maneuver walker. Increased time. Pt very anxious.     Exercises     PT Diagnosis: Difficulty walking;Abnormality of gait;Generalized weakness;Acute pain  PT Problem List: Decreased strength;Decreased activity tolerance;Decreased mobility;Decreased balance;Decreased range of motion;Decreased knowledge of use of DME;Decreased knowledge of precautions;Pain PT Treatment Interventions: DME instruction;Gait training;Functional mobility training;Therapeutic activities;Therapeutic exercise;Balance training;Patient/family education   PT Goals Acute Rehab PT Goals PT Goal Formulation: With patient Time For Goal Achievement: 05/20/12 Potential to Achieve Goals: Good Pt will go Supine/Side to Sit: with mod assist PT Goal: Supine/Side to Sit - Progress: Goal set today Pt will go Sit to Supine/Side: with mod assist PT Goal: Sit to Supine/Side - Progress: Goal set today Pt will go Sit to Stand: with mod assist PT Goal: Sit to Stand - Progress: Goal set today Pt will Transfer Bed to Chair/Chair to Bed: with mod assist PT Transfer Goal: Bed to Chair/Chair to Bed - Progress: Goal set today Pt will Ambulate: 1 - 15 feet;with mod assist;with rolling walker PT Goal: Ambulate - Progress: Goal set today  Visit Information  Last PT Received On: 05/06/12 Assistance Needed: +2    Subjective Data  Subjective: That's all I can do! Patient Stated Goal: Less pain. Regain independence   Prior Functioning       Cognition  Cognition Arousal/Alertness: Awake/alert Behavior During Therapy: Anxious Overall Cognitive Status: Within Functional Limits for tasks assessed    Extremity/Trunk Assessment Right Lower Extremity Assessment RLE ROM/Strength/Tone: [facility] for tasks  assessed Left Lower  Extremity Assessment LLE ROM/Strength/Tone: Deficits LLE ROM/Strength/Tone Deficits: hip abd/add 2/5, moves ankle   Balance Balance Balance Assessed: Yes Static Sitting Balance Static Sitting - Level of Assistance: 4: Min assist;3: Mod assist Static Standing Balance Static Standing - Balance Support: Right upper extremity supported Static Standing - Comment/# of Minutes: Sat EOB ~7-8 minutes-level of assist decreased over time.   End of Session PT - End of Session Activity Tolerance: Patient limited by pain Patient left: in chair;with call bell/phone within reach;with nursing in room  GP     Rebeca Alert, MPT Pager: [phone]

## 2012-05-06 NOTE — Progress Notes (Signed)
Occupational Therapy Treatment Patient Details Name: Joanna [name] MRN: [id number] DOB: 1928-06-09 Today's Date: 05/06/2012 Time: 0950-1010 OT Time Calculation (min): 20 min  OT Assessment / Plan / Recommendation    Follow Up Recommendations  SNF                Plan Discharge plan remains appropriate    Precautions / Restrictions Precautions Precautions: Fall;Posterior Hip Precaution Comments: WBAT LLE.  NWB R hand/wrist. Pt can use platform Restrictions Weight Bearing Restrictions: Yes LUE Weight Bearing: Non weight bearing LLE Weight Bearing: Weight bearing as tolerated Other Position/Activity Restrictions: L UE WBAT through elbow with PFRW       ADL  Transfers/Ambulation Related to ADLs: Pt cast very tight on hand. Ortho tech called. Pt and sitter educated in retrograde massage and positioning for edema. Ortho tech did loosen cast as well once pt in the chair.   Encouraged pt to move fingers into flexion and extension to decrease edema and increase range. ADL Comments: Co treat with PT      OT Goals Miscellaneous OT Goals Miscellaneous OT Goal #1: progressing  Miscellaneous OT Goal #2: Pt did tolerate OOB this day- but only bed to chair  Visit Information  Last OT Received On: 05/06/12 Assistance Needed: +2    Subjective Data  Subjective: I cant do this      Cognition  Cognition Arousal/Alertness: Awake/alert Behavior During Therapy: Anxious Overall Cognitive Status: Within Functional Limits for tasks assessed    Mobility  Bed Mobility Bed Mobility: Supine to Sit Supine to Sit: 1: +2 Total assist Supine to Sit: Patient Percentage: 30% Details for Bed Mobility Assistance: Multimodal cues for safety, technique, hand placement, NWB L UE. Assist for trunk to upright and bil LEs onto bed. Utilzed bed pad to assist. Increased time.  Transfers Sit to Stand: 1: +2 Total assist;From elevated surface;From bed Sit to Stand: Patient Percentage: 40% Stand to Sit:  1: +2 Total assist;To chair/3-in-1 Stand to Sit: Patient Percentage: 30% Details for Transfer Assistance: Multimodal cues for safety, technqiue, hand placement, L LE placement. Assist  to rise, stabilize, control descent, for L LE/UE placment, maneuver walker. Increased time. Pt very anxious.           End of Session OT - End of Session Activity Tolerance: Patient limited by pain Patient left: in chair;with call bell/phone within reach  GO     [facility], [place] Kung 05/06/2012, 11:48 AM

## 2012-05-06 NOTE — Progress Notes (Signed)
ANTICOAGULATION CONSULT NOTE - Follow Up  Pharmacy Consult for Warfarin Indication: VTE prophylaxis  Allergies  Allergen Reactions  . Cefuroxime Axetil Hives  . Lactose Intolerance (Gi) Other (See Comments)    Gi upset  . Prednisone Other (See Comments)    Pt unsure of reaction  . Clarithromycin Palpitations  . Codeine Palpitations  . Sulfonamide Derivatives Palpitations    Patient Measurements: Height: 5\' 7"  (170.2 cm) Weight: 135 lb 3.2 oz (61.326 kg) IBW/kg (Calculated) : 61.6  Vital Signs: Temp: 100.7 F (38.2 C) (04/22 0609) Temp src: Oral (04/22 0609) BP: 156/61 mmHg (04/22 0609) Pulse Rate: 89 (04/22 0609)  Labs:  Recent Labs  05/04/12 0013 05/04/12 0552 05/05/12 0434 05/06/12 0435  HGB 11.9* 11.4* 9.2* 9.4*  HCT 34.4* 33.0* 26.8* 26.6*  PLT 244 247 188 171  LABPROT 13.1  --  14.5 14.0  INR 1.00  --  1.15 1.09  CREATININE 0.80 0.67 0.75 0.61    Estimated Creatinine Clearance: 53.4 ml/min (by C-G formula based on Cr of 0.61).   Anticoag or Interacting Medications:  Cipro 400mg  IV q12h Lovenox 40mg  SQ q24h  Assessment: 79 yo female s/p fall. Now s/p left hip hemiarthroplasty and left wrist closed reduction.  Warfarin for post-op VTE prophylaxis. RN note from 4/21 am indicates patient is profoundly confused - no further notes in chart about this.  INR subtherapeutic as expected after only two doses of warfarin; however, would have expected some movement by now. Will use larger warfarin dose tonight.  CBC ok but trending down  No bleeding reported in chart notes  Potential drug interaction with cipro (mild)  Note patient is on aspirin 81mg  day at home  Goal of Therapy:  INR 2-3 Monitor platelets by anticoagulation protocol: Yes   Plan:   Repeat warfarin 7.5mg  po x 1 today  Daily PT/INR  Lovenox 40mg  sq q24h until INR >/= 1.8 per MD  Provide warfarin education prior to discharge if patient no longer confused  Darrol Angel,  PharmD Pager: [phone] 05/06/2012,7:25 AM

## 2012-05-07 DIAGNOSIS — E871 Hypo-osmolality and hyponatremia: Secondary | ICD-10-CM | POA: Diagnosis not present

## 2012-05-07 DIAGNOSIS — S52209A Unspecified fracture of shaft of unspecified ulna, initial encounter for closed fracture: Secondary | ICD-10-CM | POA: Diagnosis not present

## 2012-05-07 DIAGNOSIS — S42309S Unspecified fracture of shaft of humerus, unspecified arm, sequela: Secondary | ICD-10-CM | POA: Diagnosis not present

## 2012-05-07 DIAGNOSIS — S52599A Other fractures of lower end of unspecified radius, initial encounter for closed fracture: Secondary | ICD-10-CM | POA: Diagnosis present

## 2012-05-07 DIAGNOSIS — E876 Hypokalemia: Secondary | ICD-10-CM | POA: Diagnosis not present

## 2012-05-07 DIAGNOSIS — Z882 Allergy status to sulfonamides status: Secondary | ICD-10-CM | POA: Diagnosis not present

## 2012-05-07 DIAGNOSIS — Z96649 Presence of unspecified artificial hip joint: Secondary | ICD-10-CM | POA: Diagnosis not present

## 2012-05-07 DIAGNOSIS — R41841 Cognitive communication deficit: Secondary | ICD-10-CM | POA: Diagnosis not present

## 2012-05-07 DIAGNOSIS — S52509A Unspecified fracture of the lower end of unspecified radius, initial encounter for closed fracture: Secondary | ICD-10-CM | POA: Diagnosis not present

## 2012-05-07 DIAGNOSIS — S72009D Fracture of unspecified part of neck of unspecified femur, subsequent encounter for closed fracture with routine healing: Secondary | ICD-10-CM | POA: Diagnosis not present

## 2012-05-07 DIAGNOSIS — S52309A Unspecified fracture of shaft of unspecified radius, initial encounter for closed fracture: Secondary | ICD-10-CM | POA: Diagnosis not present

## 2012-05-07 DIAGNOSIS — S5290XB Unspecified fracture of unspecified forearm, initial encounter for open fracture type I or II: Secondary | ICD-10-CM | POA: Diagnosis not present

## 2012-05-07 DIAGNOSIS — I10 Essential (primary) hypertension: Secondary | ICD-10-CM | POA: Diagnosis present

## 2012-05-07 DIAGNOSIS — K219 Gastro-esophageal reflux disease without esophagitis: Secondary | ICD-10-CM | POA: Diagnosis present

## 2012-05-07 DIAGNOSIS — E039 Hypothyroidism, unspecified: Secondary | ICD-10-CM | POA: Diagnosis not present

## 2012-05-07 DIAGNOSIS — K589 Irritable bowel syndrome without diarrhea: Secondary | ICD-10-CM | POA: Diagnosis not present

## 2012-05-07 DIAGNOSIS — S8290XS Unspecified fracture of unspecified lower leg, sequela: Secondary | ICD-10-CM | POA: Diagnosis not present

## 2012-05-07 DIAGNOSIS — M6281 Muscle weakness (generalized): Secondary | ICD-10-CM | POA: Diagnosis not present

## 2012-05-07 DIAGNOSIS — Z8249 Family history of ischemic heart disease and other diseases of the circulatory system: Secondary | ICD-10-CM | POA: Diagnosis not present

## 2012-05-07 DIAGNOSIS — S52609A Unspecified fracture of lower end of unspecified ulna, initial encounter for closed fracture: Secondary | ICD-10-CM | POA: Diagnosis not present

## 2012-05-07 DIAGNOSIS — W19XXXA Unspecified fall, initial encounter: Secondary | ICD-10-CM | POA: Diagnosis not present

## 2012-05-07 DIAGNOSIS — Z9010 Acquired absence of unspecified breast and nipple: Secondary | ICD-10-CM | POA: Diagnosis not present

## 2012-05-07 DIAGNOSIS — Z803 Family history of malignant neoplasm of breast: Secondary | ICD-10-CM | POA: Diagnosis not present

## 2012-05-07 DIAGNOSIS — S72043A Displaced fracture of base of neck of unspecified femur, initial encounter for closed fracture: Secondary | ICD-10-CM | POA: Diagnosis not present

## 2012-05-07 DIAGNOSIS — S5290XD Unspecified fracture of unspecified forearm, subsequent encounter for closed fracture with routine healing: Secondary | ICD-10-CM | POA: Diagnosis not present

## 2012-05-07 DIAGNOSIS — J45909 Unspecified asthma, uncomplicated: Secondary | ICD-10-CM | POA: Diagnosis present

## 2012-05-07 DIAGNOSIS — S72009A Fracture of unspecified part of neck of unspecified femur, initial encounter for closed fracture: Secondary | ICD-10-CM | POA: Diagnosis not present

## 2012-05-07 DIAGNOSIS — R262 Difficulty in walking, not elsewhere classified: Secondary | ICD-10-CM | POA: Diagnosis not present

## 2012-05-07 DIAGNOSIS — J449 Chronic obstructive pulmonary disease, unspecified: Secondary | ICD-10-CM | POA: Diagnosis not present

## 2012-05-07 DIAGNOSIS — Z79899 Other long term (current) drug therapy: Secondary | ICD-10-CM | POA: Diagnosis not present

## 2012-05-07 DIAGNOSIS — Z7982 Long term (current) use of aspirin: Secondary | ICD-10-CM | POA: Diagnosis not present

## 2012-05-07 DIAGNOSIS — Z7901 Long term (current) use of anticoagulants: Secondary | ICD-10-CM | POA: Diagnosis not present

## 2012-05-07 DIAGNOSIS — Z888 Allergy status to other drugs, medicaments and biological substances status: Secondary | ICD-10-CM | POA: Diagnosis not present

## 2012-05-07 DIAGNOSIS — D649 Anemia, unspecified: Secondary | ICD-10-CM | POA: Diagnosis not present

## 2012-05-07 DIAGNOSIS — M810 Age-related osteoporosis without current pathological fracture: Secondary | ICD-10-CM | POA: Diagnosis not present

## 2012-05-07 LAB — CBC
MCH: 29.7 pg (ref 26.0–34.0)
MCV: 83.6 fL (ref 78.0–100.0)
Platelets: 173 10*3/uL (ref 150–400)
RBC: 3.17 MIL/uL — ABNORMAL LOW (ref 3.87–5.11)
RDW: 13.4 % (ref 11.5–15.5)

## 2012-05-07 LAB — BASIC METABOLIC PANEL
CO2: 24 mEq/L (ref 19–32)
Calcium: 8.4 mg/dL (ref 8.4–10.5)
Creatinine, Ser: 0.67 mg/dL (ref 0.50–1.10)
GFR calc Af Amer: >90 mL/min (ref >90)
GFR calc non Af Amer: 80 mL/min — ABNORMAL LOW (ref >90)
Sodium: 129 mEq/L — ABNORMAL LOW (ref 135–145)

## 2012-05-07 LAB — PROTIME-INR
INR: 1.13 (ref 0.00–1.49)
Prothrombin Time: 14.3 seconds (ref 11.6–15.2)

## 2012-05-07 MED ORDER — WARFARIN SODIUM 7.5 MG PO TABS: 5.0000 mg | ORAL_TABLET | Freq: Once | ORAL | Status: DC

## 2012-05-07 MED ORDER — MAGNESIUM SULFATE 40 MG/ML IJ SOLN
2.0000 g | Freq: Once | INTRAMUSCULAR | Status: AC
  Administered 2012-05-07: 2 g via INTRAVENOUS
  Filled 2012-05-07: qty 50

## 2012-05-07 MED ORDER — ENSURE COMPLETE PO LIQD: 237.0000 mL | Freq: Two times a day (BID) | ORAL | Status: DC

## 2012-05-07 MED ORDER — HYDROCODONE-ACETAMINOPHEN 5-325 MG PO TABS: 1.0000 | ORAL_TABLET | Freq: Four times a day (QID) | ORAL | Status: DC | PRN

## 2012-05-07 MED ORDER — POTASSIUM CHLORIDE CRYS ER 20 MEQ PO TBCR
40.0000 meq | EXTENDED_RELEASE_TABLET | Freq: Once | ORAL | Status: AC
  Administered 2012-05-07: 40 meq via ORAL
  Filled 2012-05-07: qty 2

## 2012-05-07 MED ORDER — WARFARIN SODIUM 7.5 MG PO TABS
7.5000 mg | ORAL_TABLET | Freq: Once | ORAL | Status: AC
  Administered 2012-05-07: 7.5 mg via ORAL
  Filled 2012-05-07: qty 1

## 2012-05-07 MED ORDER — CIPROFLOXACIN HCL 500 MG PO TABS: 500.0000 mg | ORAL_TABLET | Freq: Two times a day (BID) | ORAL | Status: AC

## 2012-05-07 MED ORDER — OXYCODONE HCL 5 MG PO TABS: 5.0000 mg | ORAL_TABLET | ORAL | Status: DC | PRN

## 2012-05-07 MED ORDER — CIPROFLOXACIN HCL 500 MG PO TABS
500.0000 mg | ORAL_TABLET | ORAL | Status: DC
  Administered 2012-05-07: 500 mg via ORAL
  Filled 2012-05-07 (×3): qty 1

## 2012-05-07 MED ORDER — FERROUS SULFATE 325 (65 FE) MG PO TABS: 325.0000 mg | ORAL_TABLET | Freq: Three times a day (TID) | ORAL | Status: DC

## 2012-05-07 MED ORDER — ENOXAPARIN SODIUM 40 MG/0.4ML ~~LOC~~ SOLN: 40.0000 mg | SUBCUTANEOUS | Status: DC

## 2012-05-07 NOTE — Progress Notes (Signed)
CSW spoke with patient's daughter-in-law, Samuella Cota (work#: [phone]) re: SNF decision. Renea Ee states they are accepting the bed offer at [facility], due to patient's friend living close to that facility and will be visiting often. Facility made aware. Anticipating possible discharge tomorrow.   Clinical Social Work Department CLINICAL SOCIAL WORK PLACEMENT NOTE 05/07/2012  Patient:  PEARLE, WANDLER  Account Number:  [id number] Admit date:  05/03/2012  Clinical Social Worker:  Orpah Greek  Date/time:  05/05/2012 03:21 PM  Clinical Social Work is seeking post-discharge placement for this patient at the following level of care:   SKILLED NURSING   (*CSW will update this form in Epic as items are completed)   05/05/2012  Patient/family provided with [facility] Department of Clinical Social Work's list of facilities offering this level of care within the geographic area requested by the patient (or if unable, by the patient's family).  05/05/2012  Patient/family informed of their freedom to choose among providers that offer the needed level of care, that participate in Medicare, Medicaid or managed care program needed by the patient, have an available bed and are willing to accept the patient.  05/05/2012  Patient/family informed of [facility]' ownership interest in [facility], as well as of the fact that they are under no obligation to receive care at this facility.  PASARR submitted to EDS on 05/05/2012 PASARR number received from EDS on 05/05/2012  FL2 transmitted to all facilities in geographic area requested by pt/family on  05/05/2012 FL2 transmitted to all facilities within larger geographic area on   Patient informed that his/her managed care company has contracts with or will negotiate with  certain facilities, including the following:     Patient/family informed of bed offers received:  05/05/2012 Patient chooses bed at [facility] Physician recommends and patient chooses bed at    Patient to be transferred to [facility] on   Patient to be transferred to facility by   The following physician request were entered in Epic:   Additional Comments: Unice Bailey, LCSW [facility] Clinical Social Worker cell #: [phone]

## 2012-05-07 NOTE — Discharge Summary (Signed)
Physician Discharge Summary  Joanna [name]:[id number] DOB: 03-May-1928 DOA: 05/03/2012  PCP: Reather Littler, MD  Admit date: 05/03/2012 Discharge date: 05/07/2012  Time spent: 50 minutes  Recommendations for Outpatient Follow-up:  Bmet and PT/INR in 3-4 days to followup on sodium potassium and magnesium levels  Discharge Diagnoses:  Principal Problem:   Closed left hip fracture Active Problems:   Fracture of radial shaft, with ulna, left, closed   Fall due to stumbling   Hyponatremia   Hypokalemia   HYPOTHYROIDISM   HYPERTENSION   COPD   Anemia   Discharge Condition: Stable  Diet recommendation: Heart healthy  Filed Weights   05/04/12 0433  Weight: 61.326 kg (135 lb 3.2 oz)    History of present illness:  Joanna [name] is a 79 y.o. female who presented to the ED after suffering a fall in her home. She reports slipping on the floor and falling onto her left hip and outstretched hand. She had increased pain afterward. In the ED, she was found to have fractures of the Left Femoral neck, and the left distal Radius and Ulna. Dr. Despina Hick of Orthopedics was consulted to see patient in the AM.    Hospital Course:  1. Left Hip Fracture and Fracture of distal radius and Ulna-   S/p hemiarthroplasty of the left hip and closed reduction of the left wriston 4/21   - further f/u with Dr Charlann Boxer in 2 wks  - PT /OT evaluation recommended SNF placement- pt agreeable  - Coumadin for dvt prophylaxis- Lovenox bridge recommended by ortho until INR is better  2. Fall due to Stumbling- accidental.   3. Hypothyroid- on Levothyroxine,. TSH with in normal limits.    4. HTN- stable   5. COPD- stable- Nebs PRN. Monitor O2 sats.   6. Anemia- post op Blood loss anemia. H&H on admission was 11.4/33, dropped to 9.2/26.6 and stabilized at 9.2 to 9.4.   7. Hyponatremia and hypokalemia: Differential include pos operative,vs pain vs dehydration vs SIADH. Her serum osmolality is 259, TSH is within normal  limits. Urine sodium is 96, probably SIADH, will put her on fluid restriction to per day.Magnesium is low normal therefore have given her 2 g of IV magnesium today as well.   9. Pseudomonas UTI: ON iv ciprofloxacin (started 4/20) but due to fever 100.7 was switched to Zosyn-sensitivities reveal that it is sensitive to Cipro therefore will switch back. Would give 5 days of treatment   9. DVT prophylaxis : she is on both lovenox and coumadin. Her platelets have dropped from 244 to 173. Continue to monitor. As mentioned above, orthopedic group would like a Lovenox bridge for Coumadin.   Consultations:  Dr.Olin-Orthopedic surgery  Discharge Exam: Filed Vitals:   05/07/12 9629 05/07/12 0800 05/07/12 1200 05/07/12 1420  BP: 151/61   116/67  Pulse: 86   91  Temp: 98 F (36.7 C)   98.6 F (37 C)  TempSrc: Oral   Oral  Resp: 18 16 18 18   Height:      Weight:      SpO2: 96% 98% 18% 98%    General: Awake alert oriented x3, in no acute distrress Cardiovascular: Regular rate and rhythm, no murmurs Respiratory: Clear to auscultation  Discharge Instructions      Discharge Orders   Future Appointments Provider Department Dept Phone   06/18/2012 1:30 PM Barbaraann Share, MD [facility] [phone]   Future Orders Complete By Expires     Call MD /  Call 911  As directed     Comments:      If you experience chest pain or shortness of breath, CALL 911 and be transported to the hospital emergency room.  If you develope a fever above 101 F, pus (white drainage) or increased drainage or redness at the wound, or calf pain, call your surgeon's office.    Constipation Prevention  As directed     Comments:      Drink plenty of fluids.  Prune juice may be helpful.  You may use a stool softener, such as Colace (over the counter) 100 mg twice a day.  Use MiraLax (over the counter) for constipation as needed.    Diet - low sodium heart healthy  As directed     Discharge instructions  As  directed     Comments:      Maintain surgical dressing for 10-14 days, then replace with gauze and tape. Keep the area dry and clean until follow up. Follow up in 2 weeks at [facility]. Call with any questions or concerns.    Increase activity slowly as tolerated  As directed     Comments:      With the left leg    Non weight bearing  As directed     Comments:      Through the left arm    Weight bearing as tolerated  As directed     Comments:      Left leg        Medication List    TAKE these medications       enoxaparin 40 MG/0.4ML injection  Commonly known as:  LOVENOX  Inject 0.4 mLs (40 mg total) into the skin daily.     HYDROcodone-acetaminophen 5-325 MG per tablet  Commonly known as:  NORCO  Take 1-2 tablets by mouth every 6 (six) hours as needed for pain.      ASK your doctor about these medications       amLODipine 5 MG tablet  Commonly known as:  NORVASC  Take 2.5 mg by mouth every evening.     aspirin 81 MG tablet  Take 81 mg by mouth every other day.     b complex vitamins tablet  Take 1 tablet by mouth every morning.     budesonide-formoterol 160-4.5 MCG/ACT inhaler  Commonly known as:  SYMBICORT  Inhale 2 puffs into the lungs 2 (two) times daily.     calcium carbonate 1250 MG tablet  Commonly known as:  OS-CAL - dosed in mg of elemental calcium  Take 1 tablet by mouth every morning.     cholecalciferol 1000 UNITS tablet  Commonly known as:  VITAMIN D  Take 1,000 Units by mouth daily.     fish oil-omega-3 fatty acids 1000 MG capsule  Take 1 g by mouth every morning.     flurbiprofen 100 MG tablet  Commonly known as:  ANSAID  Take 100 mg by mouth daily as needed (pain).     gabapentin 300 MG capsule  Commonly known as:  NEURONTIN  Take 300-600 mg by mouth daily as needed (pain).     glucosamine-chondroitin 500-400 MG tablet  Take 1 tablet by mouth 3 (three) times a week.     levothyroxine 125 MCG tablet  Commonly known as:   SYNTHROID, LEVOTHROID  Take 62.5 mcg by mouth at bedtime.     losartan 50 MG tablet  Commonly known as:  COZAAR  Take 50 mg by mouth  every evening.     metoprolol 50 MG tablet  Commonly known as:  LOPRESSOR  Take 50 mg by mouth every evening.     multivitamin with minerals Tabs  Take 1 tablet by mouth every morning.     omalizumab 150 MG injection  Commonly known as:  XOLAIR  Inject 150 mg into the skin every 28 (twenty-eight) days. Administered by Dr. Shelle Iron [phone]     omeprazole 20 MG capsule  Commonly known as:  PRILOSEC  Take 20 mg by mouth every morning.     polycarbophil 625 MG tablet  Commonly known as:  FIBERCON  Take 625 mg by mouth 2 (two) times daily.     PROAIR HFA 108 (90 BASE) MCG/ACT inhaler  Generic drug:  albuterol  Inhale 2 puffs into the lungs every 6 (six) hours as needed for wheezing or shortness of breath.     simvastatin 20 MG tablet  Commonly known as:  ZOCOR  Take 20 mg by mouth every evening.     vitamin C 500 MG tablet  Commonly known as:  ASCORBIC ACID  Take 500 mg by mouth daily.       Follow-up Information   Follow up with Shelda Pal, MD. Schedule an appointment as soon as possible for a visit in 2 weeks.   Contact information:   [address] [place] [address] [phone]        The results of significant diagnostics from this hospitalization (including imaging, microbiology, ancillary and laboratory) are listed below for reference.    Significant Diagnostic Studies: Dg Chest 1 View  05/04/2012  *RADIOLOGY REPORT*  Clinical Data: Status post fall; history of asthma.  Concern for chest injury.  CHEST - 1 VIEW  Comparison: Chest radiograph performed 02/23/2011, and CTA of the chest performed 03/27/2010  Findings: The lungs are well-aerated.  Mild chronically increased interstitial markings are seen.  Bilateral densities reflect overlying soft tissues.  There is no evidence of focal opacification, pleural  effusion or pneumothorax.  The cardiomediastinal silhouette is normal in size.  Calcification is noted within the aortic arch.  No acute osseous abnormalities are seen.  IMPRESSION: Mild chronic lung changes noted.  No acute cardiopulmonary process seen.  No displaced rib fractures identified.   Original Report Authenticated By: Tonia Ghent, M.D.    Dg Chest 2 View  05/06/2012  *RADIOLOGY REPORT*  Clinical Data: Fever and cough.  CHEST - 2 VIEW  Comparison: 05/04/2012 and 02/23/2011  Findings: The patient has new small bilateral pleural effusions. The lungs are hyperinflated consistent with emphysema.  Heart size and pulmonary vascularity are normal.  No consolidative infiltrates.  No acute osseous abnormality.  IMPRESSION: New small bilateral pleural effusions.  COPD.   Original Report Authenticated By: Francene Boyers, M.D.    Dg Forearm Left  05/04/2012  *RADIOLOGY REPORT*  Clinical Data: Distal forearm pain and bruising post fall  LEFT FOREARM - 2 VIEW  Comparison: None  Findings: Minimally distracted ulnar styloid fracture. Transverse metaphyseal fracture distal left radius, minimally comminuted, with apex volar angulation. No definite intrarticular extension. Diffuse osseous demineralization. No additional fracture, dislocation or bone destruction.  IMPRESSION: Displaced ulnar styloid fracture. Mildly displaced and angulated comminuted distal left radial metaphyseal fracture.   Original Report Authenticated By: Ulyses Southward, M.D.    Dg Hip Complete Left  05/04/2012  *RADIOLOGY REPORT*  Clinical Data: Hip pain.  LEFT HIP - COMPLETE 2+ VIEW  Comparison: None.  Findings: There appears to be  a mildly displaced transcervical fracture of the left femoral neck, difficult to fully characterize. The left femoral head remains seated at the acetabulum.  The right hip joint is unremarkable in appearance.  Mild degenerative change is noted at the lower lumbar spine.  The sacroiliac joints are unremarkable in  appearance.  The visualized bowel gas pattern is grossly unremarkable in appearance.  Scattered phleboliths are noted within the pelvis.  IMPRESSION: Mildly displaced transcervical fracture of the left femoral neck.   Original Report Authenticated By: Tonia Ghent, M.D.    Dg Pelvis Portable  05/04/2012  *RADIOLOGY REPORT*  Clinical Data: Postop left hip surgery.  PORTABLE PELVIS  Comparison: 04/03/2012  Findings: Changes of left hip replacement.  Soft tissue drain in place.  Normal AP alignment.  No hardware or bony complicating feature.  Mild degenerative changes in the right hip.  IMPRESSION: Left hip replacement.  No complicating feature.   Original Report Authenticated By: Charlett Nose, M.D.     Microbiology: Recent Results (from the past 240 hour(s))  URINE CULTURE     Status: None   Collection Time    05/04/12 12:29 PM      Result Value Range Status   Specimen Description URINE, CATHETERIZED   Final   Special Requests NONE   Final   Culture  Setup Time 05/04/2012 19:04   Final   Colony Count >=100,000 COLONIES/ML   Final   Culture PSEUDOMONAS AERUGINOSA   Final   Report Status 05/06/2012 FINAL   Final   Organism ID, Bacteria PSEUDOMONAS AERUGINOSA   Final  SURGICAL PCR SCREEN     Status: None   Collection Time    05/04/12 12:30 PM      Result Value Range Status   MRSA, PCR NEGATIVE  NEGATIVE Final   Staphylococcus aureus NEGATIVE  NEGATIVE Final   Comment:            The Xpert SA Assay (FDA     approved for NASAL specimens     in patients over 33 years of age),     is one component of     a comprehensive surveillance     program.  Test performance has     been validated by [employer] for patients greater     than or equal to 32 year old.     It is not intended     to diagnose infection nor to     guide or monitor treatment.     Labs: Basic Metabolic Panel:  Recent Labs Lab 05/04/12 0013 05/04/12 0552 05/05/12 0434 05/06/12 0435 05/07/12 0430  NA 132* 133*  129* 125* 129*  K 3.9 3.7 4.0 3.0* 3.1*  CL 96 98 99 93* 95*  CO2 27 24 24 24 24   GLUCOSE 120* 139* 124* 105* 114*  BUN 18 16 16 10 11   CREATININE 0.80 0.67 0.75 0.61 0.67  CALCIUM 9.0 8.7 8.3* 8.1* 8.4  MG  --   --   --  1.7 1.9   Liver Function Tests: No results found for this basename: AST, ALT, ALKPHOS, BILITOT, PROT, ALBUMIN,  in the last 168 hours No results found for this basename: LIPASE, AMYLASE,  in the last 168 hours No results found for this basename: AMMONIA,  in the last 168 hours CBC:  Recent Labs Lab 05/04/12 0013 05/04/12 0552 05/05/12 0434 05/06/12 0435 05/07/12 0430  WBC 7.9 15.6* 12.5* 8.2 7.4  NEUTROABS 5.2  --   --   --   --  HGB 11.9* 11.4* 9.2* 9.4* 9.4*  HCT 34.4* 33.0* 26.8* 26.6* 26.5*  MCV 84.9 84.4 85.6 83.4 83.6  PLT 244 247 188 171 173   Cardiac Enzymes: No results found for this basename: CKTOTAL, CKMB, CKMBINDEX, TROPONINI,  in the last 168 hours BNP: BNP (last 3 results) No results found for this basename: PROBNP,  in the last 8760 hours CBG: No results found for this basename: GLUCAP,  in the last 168 hours     Signed:  Bonifacio Pruden  [facility] 05/07/2012, 4:12 PM

## 2012-05-07 NOTE — Progress Notes (Signed)
Occupational Therapy Treatment Patient Details Name: MARTHE DANT MRN: [id number] DOB: [birth year] Today's Date: 05/07/2012 Time: 1350-1405 OT Time Calculation (min): 15 min  OT Assessment / Plan / Recommendation Comments on Treatment Session Pt tolerating edema management techniques and ROM to L fingers and shoulder. Nursing has encouraged OOB to commode.    Follow Up Recommendations  SNF    Barriers to Discharge       Equipment Recommendations  None recommended by OT    Recommendations for Other Services    Frequency     Plan Discharge plan remains appropriate    Precautions / Restrictions Precautions Precautions: Fall;Posterior Hip Precaution Comments: WBAT LLE.  NWB R hand/wrist. Pt can use platform Restrictions Weight Bearing Restrictions: Yes LUE Weight Bearing: Non weight bearing LLE Weight Bearing: Weight bearing as tolerated Other Position/Activity Restrictions: L UE WBAT allowed through prox forearm/elbow with PFRW   Pertinent Vitals/Pain L UE, did not rate, repositioned, premedicated    ADL  Grooming: Wash/dry face;Teeth care;Brushing hair;Set up Where Assessed - Grooming: Supine, head of bed up ADL Comments: Performed retrograde massage, PROM and AROM to fingers of L hand.  Educated pt to elevate entire L UE on 3 pillows rather than simply elevating the hand with elbow in a dependent position.    OT Diagnosis:    OT Problem List:   OT Treatment Interventions:     OT Goals Miscellaneous OT Goals Miscellaneous OT Goal #1: progressing   Visit Information  Last OT Received On: 05/07/12    Subjective Data      Prior Functioning       Cognition  Cognition Arousal/Alertness: Awake/alert Behavior During Therapy: WFL for tasks assessed/performed Overall Cognitive Status: Within Functional Limits for tasks assessed    Mobility  Bed Mobility Bed Mobility: Not assessed    Exercises      Balance     End of Session OT - End of Session Activity  Tolerance: Patient tolerated treatment well Patient left: in bed;with call bell/phone within reach  GO     Evern Bio 05/07/2012, 2:13 PM [phone]

## 2012-05-07 NOTE — Progress Notes (Signed)
   Subjective: 3 Days Post-Op Procedure(s) (LRB): ARTHROPLASTY BIPOLAR HIP (Left) CLOSED REDUCTION WRIST (Left)   Patient reports pain as mild, in the left hip and left arm. States that she still have swelling in the left hand, but she has been trying to keep it elevated and doing finger exercises. No events throughout the night.  Objective:   VITALS:   Filed Vitals:   05/07/12  BP: 151/61  Pulse: 86  Temp: 98 F (36.7 C)   Resp: 16    Neurovascular intact Dorsiflexion/Plantar flexion intact Incision: dressing C/D/I No cellulitis present Compartment soft  LABS  Recent Labs  05/05/12 0434 05/06/12 0435 05/07/12 0430  HGB 9.2* 9.4* 9.4*  HCT 26.8* 26.6* 26.5*  WBC 12.5* 8.2 7.4  PLT 188 171 173     Recent Labs  05/05/12 0434 05/06/12 0435 05/07/12 0430  NA 129* 125* 129*  K 4.0 3.0* 3.1*  BUN 16 10 11   CREATININE 0.75 0.61 0.67  GLUCOSE 124* 105* 114*     Assessment/Plan: 3 Days Post-Op Procedure(s) (LRB): ARTHROPLASTY BIPOLAR HIP (Left) CLOSED REDUCTION WRIST (Left) Up with therapy Discharge to SNF when ready medically Orthopaedically stable Lovenox bridging for Coumadin until therapeutic Oxycodone for pain management, Rx on chart Aquacel dressing on left leg an be maintained until follow up in the clinic in about 2 weeks. WBAT on left leg NWB on left arm Follow up in 2 weeks at [facility]. Follow up with OLIN,Astou Lada D in 2 weeks.  Contact information:  [facility] [address], [address] [place] [address] [phone]        Anastasio Auerbach. Taiven Greenley   PAC  05/07/2012, 1:22 PM

## 2012-05-07 NOTE — Clinical Social Work Placement (Signed)
     Clinical Social Work Department CLINICAL SOCIAL WORK PLACEMENT NOTE 05/07/2012  Patient:  Joanna [name], Joanna [name]  Account Number:  [id number] Admit date:  05/03/2012  Clinical Social Worker:  Orpah Greek  Date/time:  05/05/2012 03:21 PM  Clinical Social Work is seeking post-discharge placement for this patient at the following level of care:   SKILLED NURSING   (*CSW will update this form in Epic as items are completed)   05/05/2012  Patient/family provided with [facility] Department of Clinical Social Works list of facilities offering this level of care within the geographic area requested by the patient (or if unable, by the patients family).  05/05/2012  Patient/family informed of their freedom to choose among providers that offer the needed level of care, that participate in Medicare, Medicaid or managed care program needed by the patient, have an available bed and are willing to accept the patient.  05/05/2012  Patient/family informed of [facility] ownership interest in [facility], as well as of the fact that they are under no obligation to receive care at this facility.  PASARR submitted to EDS on 05/05/2012 PASARR number received from EDS on 05/05/2012  FL2 transmitted to all facilities in geographic area requested by pt/family on  05/05/2012 FL2 transmitted to all facilities within larger geographic area on   Patient informed that his/her managed care company has contracts with or will negotiate with  certain facilities, including the following:     Patient/family informed of bed offers received:  05/05/2012 Patient chooses bed at [facility] Physician recommends and patient chooses bed at    Patient to be transferred to [facility] on  05/07/2012 Patient to be transferred to facility by [facility]  The following physician request were entered in Epic:   Additional Comments:

## 2012-05-07 NOTE — Progress Notes (Addendum)
Patient is set to discharge to [facility]. Patient & daughter-in-law, Renea Ee aware. Discharge packet given to RN, Pattricia Boss. [facility] called for transport.     Unice Bailey, LCSW [facility] Clinical Social Worker cell #: [phone]

## 2012-05-07 NOTE — Progress Notes (Signed)
[facility] PROGRESS NOTE  KODY VIGIL QMV:[id number] DOB: 1928/12/30 DOA: 05/03/2012 PCP: Reather Littler, MD  Assessment/Plan:   1. Left Hip Fracture and Fracture of distal radius and Ulna- Orthopedics on board.  S/p hemiarthroplasty of the bipolar hip on the left side and closed reduction of the left wrist.  - further f/u with Dr Charlann Boxer in 2 wks - Pain control and IV fluids - PT /OT evaluation recommended SNF placement- pt and son agreeable - Coumadin for dvt prophylaxis- Lovenox bridge recommended by ortho  2. Fall due to Stumbling- accidental.   3. Hypothyroid- on Levothyroxine,. TSH with in normal limits.   4. HTN- stable  5. COPD- stable- Nebs PRN. Monitor O2 sats.   6. Anemia- post op  Blood loss anemia. H&H on admission was 11.4/33, dropped to 9.2/26.6 and stabilized at 9.2 to 9.4. Will continue to monitor.   7. Hyponatremia and hypokalemia:  Differential include pos operative,vs pain vs dehydration vs SIADH. Her serum osmolality is  259, TSH is within normal limits. Urine sodium is 96, probably SIADH, will put her on fluid restriction to per day. Repeat electrolytes in am. Replete potassium as needed. Magnesium is low normal therefore will give her 2 g of IV magnesium.  9. Pseudomonas UTI: ON iv ciprofloxacin (started 4/20) but due to fever 100.7 was switched to Zosyn-sensitivities reveal that it is sensitive to Cipro therefore will switch back.  Would give 5 days of treatment  9. DVT prophylaxis : she is on both lovenox and coumadin. Her platelets have dropped from 244 to 173. Continue to monitor. As mentioned above, orthopedic group would like a Lovenox bridge for Coumadin.   PROCEDURE: Procedure(s) with comments:  1. Left hip hemiARTHROPLASTY - Depuy, 5 std, 47+0  2. CLOSED REDUCTION WRIST (Left) - application long arm splint   Consultants: Orthopedics.  HPI/Subjective: Patient has no complaints. But with patient in regards to inpatient rehabilitation at a  nursing facility. She is in agreement with this.  Objective: Filed Vitals:   05/07/12 0608 05/07/12 0800 05/07/12 1200 05/07/12 1420  BP: 151/61   116/67  Pulse: 86   91  Temp: 98 F (36.7 C)   98.6 F (37 C)  TempSrc: Oral   Oral  Resp: 18 16 18 18   Height:      Weight:      SpO2: 96% 98% 18% 98%    Intake/Output Summary (Last 24 hours) at 05/07/12 1457 Last data filed at 05/07/12 1242  Gross per 24 hour  Intake   1040 ml  Output   2100 ml  Net  -1060 ml   Filed Weights   05/04/12 0433  Weight: 61.326 kg (135 lb 3.2 oz)    Exam:  CHEST: Awake and alert -no acute HEART: Regular rate and rhythm; no murmurs rubs or gallops  BACK: No kyphosis or scoliosis; no CVA tenderness  ABDOMEN: Positive Bowel Sounds, soft non-tender; no masses, no organomegaly.  EXTREMITIES: No cyanosis, clubbing or edema; no ulcerations   Data Reviewed: Basic Metabolic Panel:  Recent Labs Lab 05/04/12 0013 05/04/12 0552 05/05/12 0434 05/06/12 0435 05/07/12 0430  NA 132* 133* 129* 125* 129*  K 3.9 3.7 4.0 3.0* 3.1*  CL 96 98 99 93* 95*  CO2 27 24 24 24 24   GLUCOSE 120* 139* 124* 105* 114*  BUN 18 16 16 10 11   CREATININE 0.80 0.67 0.75 0.61 0.67  CALCIUM 9.0 8.7 8.3* 8.1* 8.4  MG  --   --   --  1.7 1.9   Liver Function Tests: No results found for this basename: AST, ALT, ALKPHOS, BILITOT, PROT, ALBUMIN,  in the last 168 hours No results found for this basename: LIPASE, AMYLASE,  in the last 168 hours No results found for this basename: AMMONIA,  in the last 168 hours CBC:  Recent Labs Lab 05/04/12 0013 05/04/12 0552 05/05/12 0434 05/06/12 0435 05/07/12 0430  WBC 7.9 15.6* 12.5* 8.2 7.4  NEUTROABS 5.2  --   --   --   --   HGB 11.9* 11.4* 9.2* 9.4* 9.4*  HCT 34.4* 33.0* 26.8* 26.6* 26.5*  MCV 84.9 84.4 85.6 83.4 83.6  PLT 244 247 188 171 173   Cardiac Enzymes: No results found for this basename: CKTOTAL, CKMB, CKMBINDEX, TROPONINI,  in the last 168 hours BNP (last 3  results) No results found for this basename: PROBNP,  in the last 8760 hours CBG: No results found for this basename: GLUCAP,  in the last 168 hours  Recent Results (from the past 240 hour(s))  URINE CULTURE     Status: None   Collection Time    05/04/12 12:29 PM      Result Value Range Status   Specimen Description URINE, CATHETERIZED   Final   Special Requests NONE   Final   Culture  Setup Time 05/04/2012 19:04   Final   Colony Count >=100,000 COLONIES/ML   Final   Culture PSEUDOMONAS AERUGINOSA   Final   Report Status 05/06/2012 FINAL   Final   Organism ID, Bacteria PSEUDOMONAS AERUGINOSA   Final  SURGICAL PCR SCREEN     Status: None   Collection Time    05/04/12 12:30 PM      Result Value Range Status   MRSA, PCR NEGATIVE  NEGATIVE Final   Staphylococcus aureus NEGATIVE  NEGATIVE Final   Comment:            The Xpert SA Assay (FDA     approved for NASAL specimens     in patients over 72 years of age),     is one component of     a comprehensive surveillance     program.  Test performance has     been validated by [employer] for patients greater     than or equal to 51 year old.     It is not intended     to diagnose infection nor to     guide or monitor treatment.     Studies: Dg Chest 2 View  05/06/2012  *RADIOLOGY REPORT*  Clinical Data: Fever and cough.  CHEST - 2 VIEW  Comparison: 05/04/2012 and 02/23/2011  Findings: The patient has new small bilateral pleural effusions. The lungs are hyperinflated consistent with emphysema.  Heart size and pulmonary vascularity are normal.  No consolidative infiltrates.  No acute osseous abnormality.  IMPRESSION: New small bilateral pleural effusions.  COPD.   Original Report Authenticated By: Francene Boyers, M.D.     Scheduled Meds: . amLODipine  2.5 mg Oral q morning - 10a  . ciprofloxacin  500 mg Oral Custom  . enoxaparin (LOVENOX) injection  40 mg Subcutaneous Q24H  . feeding supplement  237 mL Oral BID BM  . ferrous  sulfate  325 mg Oral TID PC  . levothyroxine  62.5 mcg Oral QHS  . losartan  50 mg Oral q morning - 10a  . metoprolol tartrate  50 mg Oral QHS  . warfarin  7.5 mg Oral  ONCE-1800  . Warfarin - Pharmacist Dosing Inpatient   Does not apply q1800   Continuous Infusions:         [facility]  [facility] Pager [phone] If 7PM-7AM, please contact night-coverage at [email or web address], password [facility] 05/07/2012, 2:57 PM  LOS: 4 days

## 2012-05-07 NOTE — Progress Notes (Addendum)
ANTICOAGULATION CONSULT NOTE - Follow Up  Pharmacy Consult for Warfarin Indication: VTE prophylaxis  Allergies  Allergen Reactions  . Cefuroxime Axetil Hives  . Lactose Intolerance (Gi) Other (See Comments)    Gi upset  . Prednisone Other (See Comments)    Pt unsure of reaction  . Clarithromycin Palpitations  . Codeine Palpitations  . Sulfonamide Derivatives Palpitations    Patient Measurements: Height: 5\' 7"  (170.2 cm) Weight: 135 lb 3.2 oz (61.326 kg) IBW/kg (Calculated) : 61.6  Vital Signs: Temp: 98 F (36.7 C) (04/23 0608) Temp src: Oral (04/23 0608) BP: 151/61 mmHg (04/23 0608) Pulse Rate: 86 (04/23 0608)  Labs:  Recent Labs  05/05/12 0434 05/06/12 0435 05/07/12 0430  HGB 9.2* 9.4* 9.4*  HCT 26.8* 26.6* 26.5*  PLT 188 171 173  LABPROT 14.5 14.0 14.3  INR 1.15 1.09 1.13  CREATININE 0.75 0.61 0.67    Estimated Creatinine Clearance: 53.4 ml/min (by C-G formula based on Cr of 0.67).   Anticoag or Interacting Medications:  Cipro 400mg  IV q12h (stopped 4/22, resumed 4/23) Lovenox 40mg  SQ q24h  Assessment: 79 yo female s/p fall. Now s/p left hip hemiarthroplasty and left wrist closed reduction.  Warfarin for post-op VTE prophylaxis. RN note from 4/21 am indicates patient is profoundly confused - no further notes in chart about this (resolved?).  INR subtherapeutic, would have expected some movement/trend upward by now. Will use larger warfarin dose again tonight.  CBC stable  No bleeding reported in chart notes  Potential drug interaction with cipro (mild) - d/c'd 4/22  Note patient is on aspirin 81mg  day at home  Monitor platelets  Goal of Therapy:  INR 2-3 Monitor platelets by anticoagulation protocol: Yes   Plan:   Repeat warfarin 7.5mg  po x 1 today  Daily PT/INR  Lovenox 40mg  sq q24h until INR >/= 1.8 per MD  Provide warfarin education prior to discharge if patient no longer confused  Darrol Angel, PharmD Pager:  [phone] 05/07/2012,8:36 AM   ADDENDUM: Standard warfarin education completed. Patient was able to correctly repeat back key points of education session. All questions answered. Pt stated her daughter might be coming in later today (sometimes helps with meds) - informed patient to let RN know if daughter would like to speak with a Pharmacist about warfarin.  Darrol Angel, PharmD Pager: [phone] 05/07/2012 12:06 PM

## 2012-05-07 NOTE — Progress Notes (Signed)
Pt aware of transfer today, she is currently leaving her dgt a message. Report called to [facility].

## 2012-05-08 ENCOUNTER — Encounter: Payer: Self-pay | Admitting: Adult Health

## 2012-05-08 ENCOUNTER — Non-Acute Institutional Stay (SKILLED_NURSING_FACILITY): Payer: Medicare Other | Admitting: Adult Health

## 2012-05-08 ENCOUNTER — Other Ambulatory Visit: Payer: Self-pay | Admitting: *Deleted

## 2012-05-08 DIAGNOSIS — S72009D Fracture of unspecified part of neck of unspecified femur, subsequent encounter for closed fracture with routine healing: Secondary | ICD-10-CM

## 2012-05-08 DIAGNOSIS — K219 Gastro-esophageal reflux disease without esophagitis: Secondary | ICD-10-CM

## 2012-05-08 DIAGNOSIS — M810 Age-related osteoporosis without current pathological fracture: Secondary | ICD-10-CM

## 2012-05-08 DIAGNOSIS — S72002D Fracture of unspecified part of neck of left femur, subsequent encounter for closed fracture with routine healing: Secondary | ICD-10-CM

## 2012-05-08 DIAGNOSIS — S5290XD Unspecified fracture of unspecified forearm, subsequent encounter for closed fracture with routine healing: Secondary | ICD-10-CM

## 2012-05-08 DIAGNOSIS — J449 Chronic obstructive pulmonary disease, unspecified: Secondary | ICD-10-CM

## 2012-05-08 DIAGNOSIS — S52202D Unspecified fracture of shaft of left ulna, subsequent encounter for closed fracture with routine healing: Secondary | ICD-10-CM

## 2012-05-08 MED ORDER — HYDROCODONE-ACETAMINOPHEN 5-325 MG PO TABS: ORAL_TABLET | ORAL | Status: DC

## 2012-05-12 ENCOUNTER — Non-Acute Institutional Stay (SKILLED_NURSING_FACILITY): Payer: Medicare Other | Admitting: Internal Medicine

## 2012-05-12 DIAGNOSIS — S52532S Colles' fracture of left radius, sequela: Secondary | ICD-10-CM

## 2012-05-12 DIAGNOSIS — S7292XS Unspecified fracture of left femur, sequela: Secondary | ICD-10-CM

## 2012-05-12 DIAGNOSIS — J4489 Other specified chronic obstructive pulmonary disease: Secondary | ICD-10-CM

## 2012-05-12 DIAGNOSIS — S8290XS Unspecified fracture of unspecified lower leg, sequela: Secondary | ICD-10-CM

## 2012-05-12 DIAGNOSIS — S42309S Unspecified fracture of shaft of humerus, unspecified arm, sequela: Secondary | ICD-10-CM | POA: Diagnosis not present

## 2012-05-12 DIAGNOSIS — E039 Hypothyroidism, unspecified: Secondary | ICD-10-CM | POA: Diagnosis not present

## 2012-05-12 DIAGNOSIS — J449 Chronic obstructive pulmonary disease, unspecified: Secondary | ICD-10-CM

## 2012-05-23 DIAGNOSIS — S52609A Unspecified fracture of lower end of unspecified ulna, initial encounter for closed fracture: Secondary | ICD-10-CM | POA: Diagnosis not present

## 2012-05-23 DIAGNOSIS — S72043A Displaced fracture of base of neck of unspecified femur, initial encounter for closed fracture: Secondary | ICD-10-CM | POA: Diagnosis not present

## 2012-05-27 ENCOUNTER — Encounter (HOSPITAL_COMMUNITY): Payer: Self-pay | Admitting: *Deleted

## 2012-05-27 DIAGNOSIS — S72043A Displaced fracture of base of neck of unspecified femur, initial encounter for closed fracture: Secondary | ICD-10-CM | POA: Diagnosis not present

## 2012-05-27 DIAGNOSIS — S52609A Unspecified fracture of lower end of unspecified ulna, initial encounter for closed fracture: Secondary | ICD-10-CM | POA: Diagnosis not present

## 2012-05-27 NOTE — Progress Notes (Signed)
Patient ID: Joanna [name], female   DOB: 12/27/1928, 79 y.o.   MRN: [id number]        HISTORY & PHYSICAL  DATE: 05/12/2012   FACILITY: [facility]  LEVEL OF CARE: SNF (31)  ALLERGIES:  Allergies  Allergen Reactions  . Cefuroxime Axetil Hives  . Lactose Intolerance (Gi) Other (See Comments)    Gi upset  . Prednisone Other (See Comments)    Pt unsure of reaction  . Clarithromycin Palpitations  . Codeine Palpitations  . Sulfonamide Derivatives Palpitations    CHIEF COMPLAINT:  Manage closed left hip fracture, hypothyroidism and COPD.    HISTORY OF PRESENT ILLNESS:  The patient is an 79 year-old, Caucasian female.    HIP FRACTURE: The patient had a fall and sustained fractures of the left femoral neck, left distal radius and ulna.  She underwent hemiarthroplasty of the left hip and closed reduction of the left wrist and tolerated the procedure well. Patient is admitted to this facility for short-term rehabilitation. Patient denies hip pain currently. No complications reported from the pain medications currently being used.   HYPOTHYROIDISM: The hypothyroidism remains stable. No complications noted from the medications presently being used.  The patient denies fatigue or constipation.  Last TSH normal.  COPD: the COPD remains stable.  Pt denies sob, cough, wheezing or declining exercise tolerance.  No complications from the medications presently being used.    PAST MEDICAL HISTORY :  Past Medical History  Diagnosis Date  . IBS (irritable bowel syndrome)   . Hypothyroid   . GERD (gastroesophageal reflux disease)   . Hypertension   . Allergic rhinitis   . Asthma     PAST SURGICAL HISTORY: Past Surgical History  Procedure Laterality Date  . Mastectomy      double  . Bladder surgery    . Nasal sinus surgery    . Hip arthroplasty Left 05/04/2012    Procedure: ARTHROPLASTY BIPOLAR HIP;  Surgeon: Shelda Pal, MD;  Location: [facility];  Service: Orthopedics;   Laterality: Left;  . Closed reduction wrist fracture Left 05/04/2012    Procedure: CLOSED REDUCTION WRIST;  Surgeon: Shelda Pal, MD;  Location: [facility];  Service: Orthopedics;  Laterality: Left;  with casting    SOCIAL HISTORY:  reports that she has never smoked. She has never used smokeless tobacco. She reports that she does not drink alcohol or use illicit drugs.  FAMILY HISTORY:  Family History  Problem Relation Age of Onset  . CAD Mother   . Hypertension Father   . Breast cancer Sister     CURRENT MEDICATIONS: Reviewed per [facility]  REVIEW OF SYSTEMS:  See HPI otherwise 14 point ROS is negative.  PHYSICAL EXAMINATION  VS:  T 97.8       P 80      RR 20    BP 100/84      POX%        WT (Lb)  GENERAL: no acute distress, normal body habitus SKIN: left hip incision clean and dry and closed, warm & dry, no suspicious lesions or rashes, no excessive dryness EYES: conjunctivae normal, sclerae normal, normal eye lids MOUTH/THROAT: lips without lesions,no lesions in the mouth,tongue is without lesions,uvula elevates in midline NECK: supple, trachea midline, no neck masses, no thyroid tenderness, no thyromegaly LYMPHATICS: no LAN in the neck, no supraclavicular LAN RESPIRATORY: breathing is even & unlabored, BS CTAB CARDIAC: RRR, no murmur,no extra heart sounds, no edema ARTERIAL:  pedal  pulses +1 bilaterally  GI:  ABDOMEN: abdomen soft, normal BS, no masses, no tenderness  LIVER/SPLEEN: no hepatomegaly, no splenomegaly MUSCULOSKELETAL: HEAD: normal to inspection & palpation BACK: no kyphosis, scoliosis or spinal processes tenderness EXTREMITIES: LEFT UPPER EXTREMITY: soft cast, strength and range of motion unable to assess  RIGHT UPPER EXTREMITY:  full range of motion, normal strength & tone LEFT LOWER EXTREMITY: strength intact  RIGHT LOWER EXTREMITY:  full range of motion, normal strength & tone PSYCHIATRIC: the patient is alert & oriented to person, affect & behavior  appropriate  LABS/RADIOLOGY: Chest x-ray:  No acute disease.   Left forearm x-ray showed displaced ulnar styloid fracture, mildly displaced and angulated comminuted distal left radial metaphyseal fracture.    Left hip x-ray showed mildly displaced transcervical fracture of the left femoral neck.   Pelvic x-ray showed left hip replacement.    Urine culture grew Pseudomonas aeruginosa.    MRSA by PCR negative.    Staph aureus by PCR negative.   Sodium 139, potassium 3.1, glucose 114, otherwise BMP normal.    Magnesium 1.9.   Hemoglobin 9.4, MCV 83.6, otherwise CBC normal.    ASSESSMENT/PLAN:  Left femoral neck fracture, closed.  Status post hemiarthroplasty.  Continue rehabilitation.     Left radial shaft and ulnar fracture.  Status post closed reduction.    Hypothyroidism.  Well controlled.   COPD.  Well compensated.   Hypertension.  Well controlled.    Acute blood loss anemia.  Reassess hemoglobin level.   Hyponatremia.  Reassess.   Currently on fluid restriction to 1200 mL a day.    Pseudomonas UTI.  Continue Cipro as prescribed.   Check CBC and BMP.   I have reviewed patient's medical records received at admission/from hospitalization.  CPT CODE: 16109

## 2012-05-28 ENCOUNTER — Other Ambulatory Visit (HOSPITAL_COMMUNITY): Payer: Self-pay | Admitting: *Deleted

## 2012-05-28 ENCOUNTER — Encounter (HOSPITAL_COMMUNITY): Payer: Self-pay

## 2012-05-28 ENCOUNTER — Inpatient Hospital Stay (HOSPITAL_COMMUNITY)
Admission: RE | Admit: 2012-05-28 | Discharge: 2012-05-30 | DRG: 512 | Disposition: A | Discharge status: Skilled Nursing Facility | Payer: Medicare Other | Source: Ambulatory Visit | Attending: Orthopedic Surgery | Admitting: Orthopedic Surgery

## 2012-05-28 ENCOUNTER — Other Ambulatory Visit: Payer: Self-pay | Admitting: Geriatric Medicine

## 2012-05-28 DIAGNOSIS — Z8249 Family history of ischemic heart disease and other diseases of the circulatory system: Secondary | ICD-10-CM

## 2012-05-28 DIAGNOSIS — G8918 Other acute postprocedural pain: Secondary | ICD-10-CM | POA: Diagnosis not present

## 2012-05-28 DIAGNOSIS — Z96649 Presence of unspecified artificial hip joint: Secondary | ICD-10-CM | POA: Diagnosis not present

## 2012-05-28 DIAGNOSIS — Z882 Allergy status to sulfonamides status: Secondary | ICD-10-CM

## 2012-05-28 DIAGNOSIS — S62109A Fracture of unspecified carpal bone, unspecified wrist, initial encounter for closed fracture: Secondary | ICD-10-CM | POA: Diagnosis not present

## 2012-05-28 DIAGNOSIS — Z7982 Long term (current) use of aspirin: Secondary | ICD-10-CM

## 2012-05-28 DIAGNOSIS — E039 Hypothyroidism, unspecified: Secondary | ICD-10-CM | POA: Diagnosis present

## 2012-05-28 DIAGNOSIS — S52599A Other fractures of lower end of unspecified radius, initial encounter for closed fracture: Secondary | ICD-10-CM | POA: Diagnosis not present

## 2012-05-28 DIAGNOSIS — D649 Anemia, unspecified: Secondary | ICD-10-CM | POA: Diagnosis not present

## 2012-05-28 DIAGNOSIS — Z7901 Long term (current) use of anticoagulants: Secondary | ICD-10-CM

## 2012-05-28 DIAGNOSIS — W19XXXA Unspecified fall, initial encounter: Secondary | ICD-10-CM | POA: Diagnosis present

## 2012-05-28 DIAGNOSIS — S5290XB Unspecified fracture of unspecified forearm, initial encounter for open fracture type I or II: Secondary | ICD-10-CM | POA: Diagnosis not present

## 2012-05-28 DIAGNOSIS — S72009D Fracture of unspecified part of neck of unspecified femur, subsequent encounter for closed fracture with routine healing: Secondary | ICD-10-CM | POA: Diagnosis not present

## 2012-05-28 DIAGNOSIS — S59909A Unspecified injury of unspecified elbow, initial encounter: Secondary | ICD-10-CM | POA: Diagnosis not present

## 2012-05-28 DIAGNOSIS — S52509A Unspecified fracture of the lower end of unspecified radius, initial encounter for closed fracture: Secondary | ICD-10-CM | POA: Diagnosis not present

## 2012-05-28 DIAGNOSIS — K219 Gastro-esophageal reflux disease without esophagitis: Secondary | ICD-10-CM | POA: Diagnosis not present

## 2012-05-28 DIAGNOSIS — Z9010 Acquired absence of unspecified breast and nipple: Secondary | ICD-10-CM | POA: Diagnosis not present

## 2012-05-28 DIAGNOSIS — Z79899 Other long term (current) drug therapy: Secondary | ICD-10-CM | POA: Diagnosis not present

## 2012-05-28 DIAGNOSIS — I10 Essential (primary) hypertension: Secondary | ICD-10-CM | POA: Diagnosis present

## 2012-05-28 DIAGNOSIS — J45909 Unspecified asthma, uncomplicated: Secondary | ICD-10-CM | POA: Diagnosis present

## 2012-05-28 DIAGNOSIS — Z888 Allergy status to other drugs, medicaments and biological substances status: Secondary | ICD-10-CM | POA: Diagnosis not present

## 2012-05-28 DIAGNOSIS — S52209A Unspecified fracture of shaft of unspecified ulna, initial encounter for closed fracture: Secondary | ICD-10-CM | POA: Diagnosis not present

## 2012-05-28 DIAGNOSIS — Z803 Family history of malignant neoplasm of breast: Secondary | ICD-10-CM | POA: Diagnosis not present

## 2012-05-28 DIAGNOSIS — M6281 Muscle weakness (generalized): Secondary | ICD-10-CM | POA: Diagnosis not present

## 2012-05-28 DIAGNOSIS — K589 Irritable bowel syndrome without diarrhea: Secondary | ICD-10-CM | POA: Diagnosis present

## 2012-05-28 DIAGNOSIS — E876 Hypokalemia: Secondary | ICD-10-CM | POA: Diagnosis not present

## 2012-05-28 DIAGNOSIS — R41841 Cognitive communication deficit: Secondary | ICD-10-CM | POA: Diagnosis not present

## 2012-05-28 HISTORY — DX: Adverse effect of unspecified anesthetic, initial encounter: T41.45XA

## 2012-05-28 HISTORY — DX: Other complications of anesthesia, initial encounter: T88.59XA

## 2012-05-28 LAB — BASIC METABOLIC PANEL
BUN: 20 mg/dL (ref 6–23)
Chloride: 99 mEq/L (ref 96–112)
Creatinine, Ser: 0.84 mg/dL (ref 0.50–1.10)
GFR calc Af Amer: 74 mL/min — ABNORMAL LOW (ref >90)
GFR calc non Af Amer: 63 mL/min — ABNORMAL LOW (ref >90)
Glucose, Bld: 100 mg/dL — ABNORMAL HIGH (ref 70–99)
Potassium: 4 mEq/L (ref 3.5–5.1)

## 2012-05-28 LAB — CBC
MCH: 29.6 pg (ref 26.0–34.0)
MCHC: 34.5 g/dL (ref 30.0–36.0)
MCV: 85.7 fL (ref 78.0–100.0)
Platelets: 325 10*3/uL (ref 150–400)

## 2012-05-28 LAB — PROTIME-INR: Prothrombin Time: 22 seconds — ABNORMAL HIGH (ref 11.6–15.2)

## 2012-05-28 MED ORDER — CALCIUM CARBONATE 1250 (500 CA) MG PO TABS
1.0000 | ORAL_TABLET | Freq: Every morning | ORAL | Status: DC
  Administered 2012-05-29 – 2012-05-30 (×2): 500 mg via ORAL
  Filled 2012-05-28 (×2): qty 1

## 2012-05-28 MED ORDER — ONDANSETRON HCL 4 MG PO TABS: 4.0000 mg | ORAL_TABLET | Freq: Four times a day (QID) | ORAL | Status: DC | PRN

## 2012-05-28 MED ORDER — LOSARTAN POTASSIUM 50 MG PO TABS
50.0000 mg | ORAL_TABLET | Freq: Every evening | ORAL | Status: DC
  Administered 2012-05-28 – 2012-05-29 (×2): 50 mg via ORAL
  Filled 2012-05-28 (×3): qty 1

## 2012-05-28 MED ORDER — ENSURE COMPLETE PO LIQD
237.0000 mL | Freq: Two times a day (BID) | ORAL | Status: DC
  Administered 2012-05-30 (×2): 237 mL via ORAL

## 2012-05-28 MED ORDER — METOPROLOL TARTRATE 50 MG PO TABS
50.0000 mg | ORAL_TABLET | Freq: Every evening | ORAL | Status: DC
  Administered 2012-05-28 – 2012-05-29 (×2): 50 mg via ORAL
  Filled 2012-05-28 (×3): qty 1

## 2012-05-28 MED ORDER — OXYCODONE HCL 5 MG PO TABS: 5.0000 mg | ORAL_TABLET | ORAL | Status: DC | PRN

## 2012-05-28 MED ORDER — CLINDAMYCIN PHOSPHATE 900 MG/50ML IV SOLN: 900.0000 mg | INTRAVENOUS | Status: DC

## 2012-05-28 MED ORDER — ONDANSETRON HCL 4 MG/2ML IJ SOLN: 4.0000 mg | Freq: Four times a day (QID) | INTRAMUSCULAR | Status: DC | PRN

## 2012-05-28 MED ORDER — HYDROCODONE-ACETAMINOPHEN 5-325 MG PO TABS
1.0000 | ORAL_TABLET | ORAL | Status: DC | PRN
  Administered 2012-05-28 – 2012-05-29 (×2): 2 via ORAL
  Filled 2012-05-28 (×2): qty 2

## 2012-05-28 MED ORDER — HYDROCODONE-ACETAMINOPHEN 5-325 MG PO TABS
ORAL_TABLET | ORAL | Status: AC
  Filled 2012-05-28: qty 1

## 2012-05-28 MED ORDER — MUPIROCIN 2 % EX OINT
TOPICAL_OINTMENT | Freq: Two times a day (BID) | CUTANEOUS | Status: DC
  Administered 2012-05-28 – 2012-05-30 (×4): via NASAL
  Filled 2012-05-28: qty 22

## 2012-05-28 MED ORDER — MORPHINE SULFATE 2 MG/ML IJ SOLN
1.0000 mg | INTRAMUSCULAR | Status: DC | PRN
  Administered 2012-05-30 (×2): 1 mg via INTRAVENOUS
  Filled 2012-05-28 (×2): qty 1

## 2012-05-28 MED ORDER — VITAMIN C 500 MG PO TABS: 500.0000 mg | ORAL_TABLET | Freq: Every day | ORAL | Status: DC

## 2012-05-28 MED ORDER — CALCIUM POLYCARBOPHIL 625 MG PO TABS
625.0000 mg | ORAL_TABLET | Freq: Two times a day (BID) | ORAL | Status: DC
  Administered 2012-05-28 – 2012-05-29 (×2): via ORAL
  Administered 2012-05-29 – 2012-05-30 (×2): 625 mg via ORAL
  Filled 2012-05-28 (×5): qty 1

## 2012-05-28 MED ORDER — FERROUS SULFATE 325 (65 FE) MG PO TABS
325.0000 mg | ORAL_TABLET | Freq: Three times a day (TID) | ORAL | Status: DC
  Administered 2012-05-28 – 2012-05-30 (×4): 325 mg via ORAL
  Filled 2012-05-28 (×8): qty 1

## 2012-05-28 MED ORDER — DIPHENHYDRAMINE HCL 25 MG PO CAPS
25.0000 mg | ORAL_CAPSULE | Freq: Four times a day (QID) | ORAL | Status: DC | PRN
  Administered 2012-05-28: 25 mg via ORAL
  Filled 2012-05-28: qty 1

## 2012-05-28 MED ORDER — KCL IN DEXTROSE-NACL 20-5-0.45 MEQ/L-%-% IV SOLN
INTRAVENOUS | Status: DC
  Administered 2012-05-28: via INTRAVENOUS
  Filled 2012-05-28 (×3): qty 1000

## 2012-05-28 MED ORDER — MUPIROCIN 2 % EX OINT
TOPICAL_OINTMENT | CUTANEOUS | Status: AC
  Administered 2012-05-28: 1 via NASAL
  Filled 2012-05-28: qty 22

## 2012-05-28 MED ORDER — OXYCODONE-ACETAMINOPHEN 5-325 MG PO TABS: ORAL_TABLET | ORAL | Status: DC

## 2012-05-28 MED ORDER — PANTOPRAZOLE SODIUM 40 MG PO TBEC
40.0000 mg | DELAYED_RELEASE_TABLET | Freq: Every day | ORAL | Status: DC
  Administered 2012-05-30: 40 mg via ORAL
  Filled 2012-05-28: qty 1

## 2012-05-28 MED ORDER — OXYCODONE-ACETAMINOPHEN 5-325 MG PO TABS
1.0000 | ORAL_TABLET | ORAL | Status: DC | PRN
  Administered 2012-05-29 – 2012-05-30 (×4): 2 via ORAL
  Filled 2012-05-28 (×5): qty 2

## 2012-05-28 MED ORDER — CHLORHEXIDINE GLUCONATE 4 % EX LIQD: 60.0000 mL | Freq: Once | CUTANEOUS | Status: DC

## 2012-05-28 MED ORDER — LEVOTHYROXINE SODIUM 125 MCG PO TABS
62.5000 ug | ORAL_TABLET | Freq: Every day | ORAL | Status: DC
  Administered 2012-05-29 – 2012-05-30 (×2): 62.5 ug via ORAL
  Filled 2012-05-28 (×3): qty 0.5

## 2012-05-28 MED ORDER — GABAPENTIN 300 MG PO CAPS
600.0000 mg | ORAL_CAPSULE | Freq: Every day | ORAL | Status: DC | PRN
  Administered 2012-05-28: 600 mg via ORAL
  Filled 2012-05-28: qty 2

## 2012-05-28 MED ORDER — DOCUSATE SODIUM 100 MG PO CAPS
100.0000 mg | ORAL_CAPSULE | Freq: Two times a day (BID) | ORAL | Status: DC
  Administered 2012-05-28 – 2012-05-30 (×4): 100 mg via ORAL
  Filled 2012-05-28 (×6): qty 1

## 2012-05-28 MED ORDER — FLURBIPROFEN 100 MG PO TABS
100.0000 mg | ORAL_TABLET | Freq: Every day | ORAL | Status: DC | PRN
  Filled 2012-05-28: qty 1

## 2012-05-28 MED ORDER — AMLODIPINE BESYLATE 2.5 MG PO TABS
2.5000 mg | ORAL_TABLET | Freq: Every evening | ORAL | Status: DC
  Administered 2012-05-29: 2.5 mg via ORAL
  Filled 2012-05-28 (×2): qty 1

## 2012-05-28 MED ORDER — ADULT MULTIVITAMIN W/MINERALS CH
1.0000 | ORAL_TABLET | Freq: Every day | ORAL | Status: DC
  Administered 2012-05-28 – 2012-05-30 (×3): 1 via ORAL
  Filled 2012-05-28 (×3): qty 1

## 2012-05-28 MED ORDER — SIMVASTATIN 20 MG PO TABS
20.0000 mg | ORAL_TABLET | Freq: Every day | ORAL | Status: DC
  Administered 2012-05-28 – 2012-05-29 (×2): 20 mg via ORAL
  Filled 2012-05-28 (×3): qty 1

## 2012-05-28 MED ORDER — BUDESONIDE-FORMOTEROL FUMARATE 160-4.5 MCG/ACT IN AERO
2.0000 | INHALATION_SPRAY | Freq: Two times a day (BID) | RESPIRATORY_TRACT | Status: DC
  Administered 2012-05-28 – 2012-05-30 (×4): 2 via RESPIRATORY_TRACT
  Filled 2012-05-28: qty 6

## 2012-05-28 MED ORDER — ALBUTEROL SULFATE HFA 108 (90 BASE) MCG/ACT IN AERS
2.0000 | INHALATION_SPRAY | Freq: Four times a day (QID) | RESPIRATORY_TRACT | Status: DC | PRN
  Filled 2012-05-28: qty 6.7

## 2012-05-28 MED ORDER — VITAMIN C 500 MG PO TABS
1000.0000 mg | ORAL_TABLET | Freq: Every day | ORAL | Status: DC
  Administered 2012-05-28 – 2012-05-30 (×3): 1000 mg via ORAL
  Filled 2012-05-28 (×3): qty 2

## 2012-05-28 MED ORDER — OXYCODONE-ACETAMINOPHEN 5-325 MG PO TABS: 1.0000 | ORAL_TABLET | Freq: Four times a day (QID) | ORAL | Status: DC | PRN

## 2012-05-28 NOTE — Progress Notes (Signed)
PAtient complains of pain to left arm, rates pain a 8. Hydrocodone 5/325 1 tablet po given at 1725.

## 2012-05-28 NOTE — Anesthesia Preprocedure Evaluation (Addendum)
Anesthesia Evaluation  Patient identified by MRN, date of birth, ID band Patient awake    Reviewed: Allergy & Precautions, H&P , NPO status , Patient's Chart, lab work & pertinent test results, reviewed documented beta blocker date and time   History of Anesthesia Complications (+) Emergence Delirium  Airway Mallampati: II TM Distance: >3 FB Neck ROM: full    Dental  (+) Teeth Intact   Pulmonary asthma , COPD COPD inhaler,  breath sounds clear to auscultation  Pulmonary exam normal       Cardiovascular hypertension, On Medications and On Home Beta Blockers negative cardio ROS  Rhythm:regular Rate:Normal     Neuro/Psych negative neurological ROS  negative psych ROS   GI/Hepatic Neg liver ROS, GERD-  Medicated and Controlled,IBS   Endo/Other  negative endocrine ROSHypothyroidism   Renal/GU negative Renal ROS  negative genitourinary   Musculoskeletal   Abdominal   Peds  Hematology negative hematology ROS (+) anemia ,   Anesthesia Other Findings See surgeon's H&P  Confusion after last GA 4/14  Reproductive/Obstetrics negative OB ROS                          Anesthesia Physical Anesthesia Plan  ASA: II  Anesthesia Plan: General   Post-op Pain Management:    Induction: Intravenous  Airway Management Planned: LMA  Additional Equipment:   Intra-op Plan:   Post-operative Plan: Extubation in OR  Informed Consent: I have reviewed the patients History and Physical, chart, labs and discussed the procedure including the risks, benefits and alternatives for the proposed anesthesia with the patient or authorized representative who has indicated his/her understanding and acceptance.   Dental Advisory Given  Plan Discussed with: CRNA and Surgeon  Anesthesia Plan Comments: (INR 2.0 Plan GA w LMA Due to confusion with last surgery will try to stay with Propofol drip and fentanyl)        Anesthesia Quick Evaluation

## 2012-05-28 NOTE — Progress Notes (Signed)
Notified Dr. Adonis Huguenin that patient had sausage, eggs, prune  Juice and OJ, finishing at 1000. He will notify OR desk.

## 2012-05-28 NOTE — Progress Notes (Signed)
Rhonda in OR called to ask about surgery time and she stated they are waiting on Dr. Melvyn Novas to call and give arrival time.  Bjorn Loser will also let him know that we need an order for consent.

## 2012-05-28 NOTE — H&P (Signed)
Joanna [name] is an 79 y.o. female.   Chief Complaint: Pt fell several weeks ago and sustained closed left distal radius fracture and left hip fracture HPI: Pt followed in office and distal radius went on to further collapse and is being admitted for surgery Was attempting to do surgery today but because of backlog in or today unable to take patient to OR at reasonable time for elective surgery. Will admit for surgery tomorrow.  Past Medical History  Diagnosis Date  . IBS (irritable bowel syndrome)   . Hypothyroid   . GERD (gastroesophageal reflux disease)   . Hypertension   . Allergic rhinitis   . Asthma   . Complication of anesthesia     confusion after 04/2012    Past Surgical History  Procedure Laterality Date  . Mastectomy      double  . Bladder surgery    . Nasal sinus surgery    . Hip arthroplasty Left 05/04/2012    Procedure: ARTHROPLASTY BIPOLAR HIP;  Surgeon: Shelda Pal, MD;  Location: [facility];  Service: Orthopedics;  Laterality: Left;  . Closed reduction wrist fracture Left 05/04/2012    Procedure: CLOSED REDUCTION WRIST;  Surgeon: Shelda Pal, MD;  Location: [facility];  Service: Orthopedics;  Laterality: Left;  with casting    Family History  Problem Relation Age of Onset  . CAD Mother   . Hypertension Father   . Breast cancer Sister    Social History:  reports that she has never smoked. She has never used smokeless tobacco. She reports that she does not drink alcohol or use illicit drugs.  Allergies:  Allergies  Allergen Reactions  . Cefuroxime Axetil Hives  . Lactose Intolerance (Gi) Other (See Comments)    Gi upset  . Prednisone Other (See Comments)    Pt unsure of reaction  . Clarithromycin Palpitations  . Codeine Palpitations  . Sulfonamide Derivatives Palpitations    Medications Prior to Admission  Medication Sig Dispense Refill  . amLODipine (NORVASC) 5 MG tablet Take 2.5 mg by mouth every evening.      Marland Kitchen aspirin 81 MG tablet Take 81 mg by  mouth every other day.       . budesonide-formoterol (SYMBICORT) 160-4.5 MCG/ACT inhaler Inhale 2 puffs into the lungs 2 (two) times daily.        . ferrous sulfate 325 (65 FE) MG tablet Take 1 tablet (325 mg total) by mouth 3 (three) times daily after meals.  30 tablet  0  . gabapentin (NEURONTIN) 300 MG capsule Take 600 mg by mouth daily as needed (pain).       Marland Kitchen levothyroxine (SYNTHROID, LEVOTHROID) 125 MCG tablet Take 62.5 mcg by mouth at bedtime.       . metoprolol (LOPRESSOR) 50 MG tablet Take 50 mg by mouth every evening.      Marland Kitchen oxyCODONE (OXY IR/ROXICODONE) 5 MG immediate release tablet Take 1 tablet (5 mg total) by mouth every 4 (four) hours as needed.  30 tablet  0  . oxyCODONE-acetaminophen (PERCOCET/ROXICET) 5-325 MG per tablet Take one to two tablets by mouth every 4 to 6 hours as needed for pain.  240 tablet  0  . simvastatin (ZOCOR) 20 MG tablet Take 20 mg by mouth every evening.      . warfarin (COUMADIN) 7.5 MG tablet Take 0.5 tablets (3.75 mg total) by mouth one time only at 6 PM.  30 tablet  0  . albuterol (PROAIR HFA) 108 (90 BASE) MCG/ACT  inhaler Inhale 2 puffs into the lungs every 6 (six) hours as needed for wheezing or shortness of breath.       Marland Kitchen b complex vitamins tablet Take 1 tablet by mouth every morning.      . calcium carbonate (OS-CAL - DOSED IN MG OF ELEMENTAL CALCIUM) 1250 MG tablet Take 1 tablet by mouth every morning.      . cholecalciferol (VITAMIN D) 1000 UNITS tablet Take 1,000 Units by mouth daily.      Marland Kitchen enoxaparin (LOVENOX) 40 MG/0.4ML injection Inject 0.4 mLs (40 mg total) into the skin daily.  12 Syringe  0  . enoxaparin (LOVENOX) 40 MG/0.4ML injection Inject 0.4 mLs (40 mg total) into the skin daily.  8 Syringe  0  . feeding supplement (ENSURE COMPLETE) LIQD Take 237 mLs by mouth 2 (two) times daily between meals.  30 Bottle  0  . fish oil-omega-3 fatty acids 1000 MG capsule Take 1 g by mouth every morning.      . flurbiprofen (ANSAID) 100 MG tablet Take  100 mg by mouth daily as needed (pain).       Marland Kitchen glucosamine-chondroitin 500-400 MG tablet Take 1 tablet by mouth 3 (three) times a week.      Marland Kitchen HYDROcodone-acetaminophen (NORCO) 5-325 MG per tablet Take one tablet by mouth every 6 hours as needed for pain  120 tablet  0  . losartan (COZAAR) 50 MG tablet Take 50 mg by mouth every evening.      . Multiple Vitamin (MULTIVITAMIN WITH MINERALS) TABS Take 1 tablet by mouth every morning.      Marland Kitchen omalizumab (XOLAIR) 150 MG injection Inject 150 mg into the skin every 28 (twenty-eight) days. Administered by Dr. Shelle Iron [phone]      . omeprazole (PRILOSEC) 20 MG capsule Take 20 mg by mouth every morning.      . polycarbophil (FIBERCON) 625 MG tablet Take 625 mg by mouth 2 (two) times daily.      . vitamin C (ASCORBIC ACID) 500 MG tablet Take 500 mg by mouth daily.        Results for orders placed during the hospital encounter of 05/28/12 (from the past 48 hour(s))  SURGICAL PCR SCREEN     Status: None   Collection Time    05/28/12  2:02 PM      Result Value Range   MRSA, PCR NEGATIVE  NEGATIVE   Staphylococcus aureus NEGATIVE  NEGATIVE   Comment:            The Xpert SA Assay (FDA     approved for NASAL specimens     in patients over 64 years of age),     is one component of     a comprehensive surveillance     program.  Test performance has     been validated by [employer] for patients greater     than or equal to 21 year old.     It is not intended     to diagnose infection nor to     guide or monitor treatment.  APTT     Status: None   Collection Time    05/28/12  2:14 PM      Result Value Range   aPTT 36  24 - 37 seconds  CBC     Status: Abnormal   Collection Time    05/28/12  2:14 PM      Result Value Range  WBC 7.6  4.0 - 10.5 K/uL   RBC 3.85 (*) 3.87 - 5.11 MIL/uL   Hemoglobin 11.4 (*) 12.0 - 15.0 g/dL   HCT 16.1 (*) 09.6 - 04.5 %   MCV 85.7  78.0 - 100.0 fL   MCH 29.6  26.0 - 34.0 pg   MCHC 34.5  30.0 - 36.0  g/dL   RDW 40.9  81.1 - 91.4 %   Platelets 325  150 - 400 K/uL  PROTIME-INR     Status: Abnormal   Collection Time    05/28/12  2:14 PM      Result Value Range   Prothrombin Time 22.0 (*) 11.6 - 15.2 seconds   INR 2.01 (*) 0.00 - 1.49  BASIC METABOLIC PANEL     Status: Abnormal   Collection Time    05/28/12  2:14 PM      Result Value Range   Sodium 135  135 - 145 mEq/L   Potassium 4.0  3.5 - 5.1 mEq/L   Chloride 99  96 - 112 mEq/L   CO2 27  19 - 32 mEq/L   Glucose, Bld 100 (*) 70 - 99 mg/dL   BUN 20  6 - 23 mg/dL   Creatinine, Ser 7.82  0.50 - 1.10 mg/dL   Calcium 9.5  8.4 - 95.6 mg/dL   GFR calc non Af Amer 63 (*) >90 mL/min   GFR calc Af Amer 74 (*) >90 mL/min   Comment:            The eGFR has been calculated     using the CKD EPI equation.     This calculation has not been     validated in all clinical     situations.     eGFR's persistently     <90 mL/min signify     possible Chronic Kidney Disease.   No results found.  NO RECENT ILLNESSESS WAS JUST IN HOSPITAL FOR BROKEN LEFT HIP AND HAD BIPOLAR ARHTROPLASTY DONE  Blood pressure 126/73, pulse 60, temperature 98.4 F (36.9 C), temperature source Oral, resp. rate 18, height 5\' 7"  (1.702 m), weight 55.7 kg (122 lb 12.7 oz), SpO2 98.00%. General Appearance:  Alert, cooperative, no distress, appears stated age  Head:  Normocephalic, without obvious abnormality, atraumatic  Eyes:  Pupils equal, conjunctiva/corneas clear,         Throat: Lips, mucosa, and tongue normal; teeth and gums normal  Neck: No visible masses     Lungs:   respirations unlabored  Chest Wall:  No tenderness or deformity  Heart:  Regular rate and rhythm,  Abdomen:   Soft, non-tender,         Extremities: LUE: SKIN INTACT WIGGLES FINGER FINGERS WARM WELL PERFUSED ABLE TO EXTEND THUMB  Pulses: 2+ and symmetric  Skin: Skin color, texture, turgor normal, no rashes or lesions     Neurologic: Normal    Assessment/Plan LEFT DISTAL RADIUS  NASCENT MALUNION, DISTAL RADIUS FRACTURE WITH COLLAPSE  lEFT DISTAL RADIUS OPEN REPAIR AND INTERNAL FIXATION  R/B/A DISCUSSED WITH PT IN OFFICE.  PT VOICED UNDERSTANDING OF PLAN CONSENT SIGNED DAY OF SURGERY PT SEEN AND EXAMINED PRIOR TO OPERATIVE PROCEDURE/DAY OF SURGERY SITE MARKED. QUESTIONS ANSWERED WILL BE ADMITTED TONIGHT AND REMAIN IN HOSPITAL  FOLLOWING SURGERY  Sharma Covert 05/28/2012, 5:12 PM

## 2012-05-29 ENCOUNTER — Encounter (HOSPITAL_COMMUNITY)
Admission: RE | Disposition: A | Discharge status: Skilled Nursing Facility | Payer: Self-pay | Source: Ambulatory Visit | Attending: Orthopedic Surgery

## 2012-05-29 ENCOUNTER — Inpatient Hospital Stay (HOSPITAL_COMMUNITY): Payer: Medicare Other | Admitting: Anesthesiology

## 2012-05-29 ENCOUNTER — Encounter (HOSPITAL_COMMUNITY): Payer: Self-pay | Admitting: Anesthesiology

## 2012-05-29 DIAGNOSIS — K219 Gastro-esophageal reflux disease without esophagitis: Secondary | ICD-10-CM | POA: Diagnosis not present

## 2012-05-29 DIAGNOSIS — G8918 Other acute postprocedural pain: Secondary | ICD-10-CM | POA: Diagnosis not present

## 2012-05-29 DIAGNOSIS — E039 Hypothyroidism, unspecified: Secondary | ICD-10-CM | POA: Diagnosis not present

## 2012-05-29 DIAGNOSIS — S52599A Other fractures of lower end of unspecified radius, initial encounter for closed fracture: Secondary | ICD-10-CM | POA: Diagnosis not present

## 2012-05-29 DIAGNOSIS — K589 Irritable bowel syndrome without diarrhea: Secondary | ICD-10-CM | POA: Diagnosis not present

## 2012-05-29 HISTORY — PX: OPEN REDUCTION INTERNAL FIXATION (ORIF) DISTAL RADIAL FRACTURE: SHX5989

## 2012-05-29 SURGERY — OPEN REDUCTION INTERNAL FIXATION (ORIF) DISTAL RADIUS FRACTURE
Anesthesia: General | Site: Wrist | Laterality: Left | Wound class: Clean

## 2012-05-29 MED ORDER — ACETAMINOPHEN 10 MG/ML IV SOLN
1000.0000 mg | Freq: Once | INTRAVENOUS | Status: AC
  Administered 2012-05-29: 1000 mg via INTRAVENOUS

## 2012-05-29 MED ORDER — CLINDAMYCIN PHOSPHATE 600 MG/50ML IV SOLN
600.0000 mg | Freq: Once | INTRAVENOUS | Status: AC
  Administered 2012-05-29: 600 mg via INTRAVENOUS
  Filled 2012-05-29: qty 50

## 2012-05-29 MED ORDER — LIDOCAINE HCL (CARDIAC) 20 MG/ML IV SOLN
INTRAVENOUS | Status: DC | PRN
  Administered 2012-05-29: 60 mg via INTRAVENOUS

## 2012-05-29 MED ORDER — WARFARIN SODIUM 5 MG PO TABS: 5.0000 mg | ORAL_TABLET | Freq: Once | ORAL | Status: DC

## 2012-05-29 MED ORDER — GABAPENTIN 300 MG PO CAPS: 600.0000 mg | ORAL_CAPSULE | Freq: Every day | ORAL | Status: DC

## 2012-05-29 MED ORDER — SUFENTANIL CITRATE 50 MCG/ML IV SOLN
INTRAVENOUS | Status: DC | PRN
  Administered 2012-05-29: 10 ug via INTRAVENOUS

## 2012-05-29 MED ORDER — CLINDAMYCIN PHOSPHATE 600 MG/50ML IV SOLN
INTRAVENOUS | Status: DC | PRN
  Administered 2012-05-29: 600 mg via INTRAVENOUS

## 2012-05-29 MED ORDER — ONDANSETRON HCL 4 MG/2ML IJ SOLN
INTRAMUSCULAR | Status: DC | PRN
  Administered 2012-05-29: 4 mg via INTRAVENOUS

## 2012-05-29 MED ORDER — WARFARIN - PHYSICIAN DOSING INPATIENT: Freq: Every day | Status: DC

## 2012-05-29 MED ORDER — FENTANYL CITRATE 0.05 MG/ML IJ SOLN
INTRAMUSCULAR | Status: AC
  Filled 2012-05-29: qty 2

## 2012-05-29 MED ORDER — GABAPENTIN 600 MG PO TABS
600.0000 mg | ORAL_TABLET | Freq: Every day | ORAL | Status: DC
  Administered 2012-05-30: 600 mg via ORAL
  Filled 2012-05-29: qty 1

## 2012-05-29 MED ORDER — 0.9 % SODIUM CHLORIDE (POUR BTL) OPTIME
TOPICAL | Status: DC | PRN
  Administered 2012-05-29: 1000 mL

## 2012-05-29 MED ORDER — LACTATED RINGERS IV SOLN
INTRAVENOUS | Status: DC | PRN
  Administered 2012-05-29: 18:00:00 via INTRAVENOUS

## 2012-05-29 MED ORDER — BISACODYL 10 MG RE SUPP
10.0000 mg | Freq: Every day | RECTAL | Status: DC | PRN
  Administered 2012-05-30: 10 mg via RECTAL
  Filled 2012-05-29: qty 1

## 2012-05-29 MED ORDER — FENTANYL CITRATE 0.05 MG/ML IJ SOLN
25.0000 ug | INTRAMUSCULAR | Status: DC | PRN
  Administered 2012-05-29 (×2): 25 ug via INTRAVENOUS

## 2012-05-29 MED ORDER — PROPOFOL 10 MG/ML IV BOLUS
INTRAVENOUS | Status: DC | PRN
  Administered 2012-05-29: 120 mg via INTRAVENOUS

## 2012-05-29 MED ORDER — FENTANYL CITRATE 0.05 MG/ML IJ SOLN
INTRAMUSCULAR | Status: DC | PRN
  Administered 2012-05-29: 50 ug via INTRAVENOUS
  Administered 2012-05-29: 100 ug via INTRAVENOUS
  Administered 2012-05-29 (×2): 50 ug via INTRAVENOUS

## 2012-05-29 MED ORDER — PROPOFOL INFUSION 10 MG/ML OPTIME
INTRAVENOUS | Status: DC | PRN
  Administered 2012-05-29: 100 ug/kg/min via INTRAVENOUS

## 2012-05-29 MED ORDER — WARFARIN SODIUM 5 MG PO TABS
5.0000 mg | ORAL_TABLET | Freq: Once | ORAL | Status: AC
  Administered 2012-05-30: 5 mg via ORAL
  Filled 2012-05-29: qty 1

## 2012-05-29 MED ORDER — LACTATED RINGERS IV SOLN
INTRAVENOUS | Status: DC
  Administered 2012-05-29: 16:00:00 via INTRAVENOUS

## 2012-05-29 MED ORDER — FLEET ENEMA 7-19 GM/118ML RE ENEM: 1.0000 | ENEMA | Freq: Every day | RECTAL | Status: DC | PRN

## 2012-05-29 MED ORDER — BUPIVACAINE HCL (PF) 0.25 % IJ SOLN
INTRAMUSCULAR | Status: DC | PRN
  Administered 2012-05-29: 1 mL

## 2012-05-29 SURGICAL SUPPLY — 68 items
BANDAGE ELASTIC 3 VELCRO ST LF (GAUZE/BANDAGES/DRESSINGS) ×2 IMPLANT
BANDAGE ELASTIC 4 VELCRO ST LF (GAUZE/BANDAGES/DRESSINGS) ×2 IMPLANT
BANDAGE GAUZE ELAST BULKY 4 IN (GAUZE/BANDAGES/DRESSINGS) ×2 IMPLANT
BIT DRILL 2.2 SS TIBIAL (BIT) ×1 IMPLANT
BLADE SURG ROTATE 9660 (MISCELLANEOUS) IMPLANT
BNDG CMPR 9X4 STRL LF SNTH (GAUZE/BANDAGES/DRESSINGS) ×1
BNDG ESMARK 4X9 LF (GAUZE/BANDAGES/DRESSINGS) ×2 IMPLANT
CLOTH BEACON ORANGE TIMEOUT ST (SAFETY) ×2 IMPLANT
CORDS BIPOLAR (ELECTRODE) ×2 IMPLANT
COVER SURGICAL LIGHT HANDLE (MISCELLANEOUS) ×2 IMPLANT
CUFF TOURNIQUET SINGLE 18IN (TOURNIQUET CUFF) ×2 IMPLANT
CUFF TOURNIQUET SINGLE 24IN (TOURNIQUET CUFF) IMPLANT
DRAIN TLS ROUND 10FR (DRAIN) IMPLANT
DRAPE OEC MINIVIEW 54X84 (DRAPES) ×2 IMPLANT
DRAPE SURG 17X11 SM STRL (DRAPES) ×2 IMPLANT
DRSG ADAPTIC 3X8 NADH LF (GAUZE/BANDAGES/DRESSINGS) ×2 IMPLANT
ELECT REM PT RETURN 9FT ADLT (ELECTROSURGICAL)
ELECTRODE REM PT RTRN 9FT ADLT (ELECTROSURGICAL) IMPLANT
GAUZE SPONGE 4X4 16PLY XRAY LF (GAUZE/BANDAGES/DRESSINGS) ×2 IMPLANT
GLOVE BIOGEL PI IND STRL 8.5 (GLOVE) ×1 IMPLANT
GLOVE BIOGEL PI INDICATOR 8.5 (GLOVE) ×1
GLOVE SURG ORTHO 8.0 STRL STRW (GLOVE) ×2 IMPLANT
GOWN PREVENTION PLUS XLARGE (GOWN DISPOSABLE) ×2 IMPLANT
GOWN STRL NON-REIN LRG LVL3 (GOWN DISPOSABLE) ×6 IMPLANT
K-WIRE 1.6 (WIRE) ×2
K-WIRE FX5X1.6XNS BN SS (WIRE) ×1
KIT BASIN OR (CUSTOM PROCEDURE TRAY) ×2 IMPLANT
KIT ROOM TURNOVER OR (KITS) ×2 IMPLANT
KWIRE FX5X1.6XNS BN SS (WIRE) IMPLANT
MANIFOLD NEPTUNE II (INSTRUMENTS) ×2 IMPLANT
NDL HYPO 25GX1X1/2 BEV (NEEDLE) IMPLANT
NDL HYPO 25X1 1.5 SAFETY (NEEDLE) ×1 IMPLANT
NEEDLE HYPO 25GX1X1/2 BEV (NEEDLE) ×2 IMPLANT
NEEDLE HYPO 25X1 1.5 SAFETY (NEEDLE) ×2 IMPLANT
NS IRRIG 1000ML POUR BTL (IV SOLUTION) ×2 IMPLANT
PACK ORTHO EXTREMITY (CUSTOM PROCEDURE TRAY) ×2 IMPLANT
PAD ARMBOARD 7.5X6 YLW CONV (MISCELLANEOUS) ×4 IMPLANT
PAD CAST 4YDX4 CTTN HI CHSV (CAST SUPPLIES) ×1 IMPLANT
PADDING CAST COTTON 4X4 STRL (CAST SUPPLIES) ×2
PEG LOCKING SMOOTH 2.2X18 (Peg) ×3 IMPLANT
PEG LOCKING SMOOTH 2.2X20 (Screw) ×3 IMPLANT
PLATE NARROW DVR LEFT (Plate) ×1 IMPLANT
SCREW 2.7X12MM (Screw) ×2 IMPLANT
SCREW 2.7X14MM (Screw) ×1 IMPLANT
SCREW BN 14X2.7XNONLOCK 3 LD (Screw) IMPLANT
SCREW LOCK 12X2.7X 3 LD (Screw) IMPLANT
SCREW LOCK 14X2.7X 3 LD TPR (Screw) IMPLANT
SCREW LOCKING 2.7X12MM (Screw) ×2 IMPLANT
SCREW LOCKING 2.7X14 (Screw) ×2 IMPLANT
SCREW NLOCK 2.7X14 (Screw) ×1 IMPLANT
SCREW NONLOCK 2.7X20MM (Screw) ×1 IMPLANT
SOAP 2 % CHG 4 OZ (WOUND CARE) ×2 IMPLANT
SPLINT FIBERGLASS 3X35 (CAST SUPPLIES) ×1 IMPLANT
SPONGE GAUZE 4X4 12PLY (GAUZE/BANDAGES/DRESSINGS) ×2 IMPLANT
SPONGE LAP 4X18 X RAY DECT (DISPOSABLE) ×2 IMPLANT
STRIP CLOSURE SKIN 1/2X4 (GAUZE/BANDAGES/DRESSINGS) IMPLANT
SUCTION FRAZIER TIP 10 FR DISP (SUCTIONS) ×2 IMPLANT
SUT ETHILON 4 0 PS 2 18 (SUTURE) ×2 IMPLANT
SUT MNCRL AB 4-0 PS2 18 (SUTURE) IMPLANT
SUT VIC AB 2-0 FS1 27 (SUTURE) ×2 IMPLANT
SUT VICRYL 4-0 PS2 18IN ABS (SUTURE) ×2 IMPLANT
SYR CONTROL 10ML LL (SYRINGE) ×1 IMPLANT
SYSTEM CHEST DRAIN TLS 7FR (DRAIN) IMPLANT
TOWEL OR 17X24 6PK STRL BLUE (TOWEL DISPOSABLE) ×2 IMPLANT
TOWEL OR 17X26 10 PK STRL BLUE (TOWEL DISPOSABLE) ×2 IMPLANT
TUBE CONNECTING 12X1/4 (SUCTIONS) ×2 IMPLANT
WATER STERILE IRR 1000ML POUR (IV SOLUTION) ×2 IMPLANT
YANKAUER SUCT BULB TIP NO VENT (SUCTIONS) IMPLANT

## 2012-05-29 NOTE — Brief Op Note (Signed)
05/28/2012 - 05/29/2012  5:55 PM  PATIENT:  Joanna [name]  79 y.o. female  PRE-OPERATIVE DIAGNOSIS:  Left Distal Radius Fracture  POST-OPERATIVE DIAGNOSIS:  SAME  PROCEDURE:  Procedure(s): OPEN REDUCTION INTERNAL FIXATION (ORIF) DISTAL RADIAL FRACTURE (Left)  SURGEON:  Surgeon(s) and Role:    * Sharma Covert, MD - Primary  PHYSICIAN ASSISTANT: NONE  ASSISTANTS: none   ANESTHESIA:   general  EBL:  Total I/O In: 240 [P.O.:240] Out: -   BLOOD ADMINISTERED:none  DRAINS: none   LOCAL MEDICATIONS USED:  MARCAINE     SPECIMEN:  No Specimen  DISPOSITION OF SPECIMEN:  N/A  COUNTS:  YES  TOURNIQUET:    DICTATION: .[id number]  PLAN OF CARE: Admit for overnight observation  PATIENT DISPOSITION:  PACU - hemodynamically stable.   Delay start of Pharmacological VTE agent (>24hrs) due to surgical blood loss or risk of bleeding: not applicable

## 2012-05-29 NOTE — Progress Notes (Signed)
INITIAL NUTRITION ASSESSMENT  DOCUMENTATION CODES Per approved criteria  -Not Applicable   INTERVENTION: 1.  Modify diet; resume once medically appropriate.  NUTRITION DIAGNOSIS: Increased nutrient needs related to healing as evidenced by pt with fx.   Monitor:  1.  Food/Beverage; resume of PO diet once medically appropriate with pt meeting >/=90% estimated needs.  Reason for Assessment: MST  79 y.o. female  Admitting Dx: hip fx  ASSESSMENT: Pt admitted with h/o of hip and radius fracture with further collapse.  Pt returned from OR yesterday for hip repair and is not on floor at time of visit- in OR for wrist repair.  Pt reported wt loss and poor intake PTA.  RD notes wt loss from 135-122 lbs on chart review.  Will continue to monitor for clinical significance of wt loss and nutrition-related interventions.   Height: Ht Readings from Last 1 Encounters:  05/28/12 5\' 7"  (1.702 m)    Weight: Wt Readings from Last 1 Encounters:  05/28/12 122 lb 12.7 oz (55.7 kg)    Ideal Body Weight: 135  % Ideal Body Weight: 90%  Wt Readings from Last 10 Encounters:  05/28/12 122 lb 12.7 oz (55.7 kg)  05/28/12 122 lb 12.7 oz (55.7 kg)  05/08/12 135 lb (61.236 kg)  05/04/12 135 lb 3.2 oz (61.326 kg)  05/04/12 135 lb 3.2 oz (61.326 kg)  02/19/12 130 lb 6.4 oz (59.149 kg)  01/08/12 134 lb 6.4 oz (60.963 kg)  10/16/11 135 lb 9.6 oz (61.508 kg)  06/12/11 139 lb 3.2 oz (63.141 kg)  03/13/11 138 lb 9.6 oz (62.869 kg)    Usual Body Weight: 135 lbs  % Usual Body Weight: 90%  BMI:  Body mass index is 19.23 kg/(m^2).  Estimated Nutritional Needs: Kcal: 1530-1720 Protein: 60-70g Fluid: >1.5 L/day  Skin: intact  Diet Order: NPO  EDUCATION NEEDS: -Education not appropriate at this time   Intake/Output Summary (Last 24 hours) at 05/29/12 1635 Last data filed at 05/29/12 1300  Gross per 24 hour  Intake    880 ml  Output    600 ml  Net    280 ml    Last BM:  PTA  Labs:   Recent Labs Lab 05/28/12 1414  NA 135  K 4.0  CL 99  CO2 27  BUN 20  CREATININE 0.84  CALCIUM 9.5  GLUCOSE 100*    CBG (last 3)  No results found for this basename: GLUCAP,  in the last 72 hours  Scheduled Meds: . amLODipine  2.5 mg Oral QPM  . budesonide-formoterol  2 puff Inhalation BID  . calcium carbonate  1 tablet Oral q morning - 10a  . docusate sodium  100 mg Oral BID  . feeding supplement  237 mL Oral BID BM  . ferrous sulfate  325 mg Oral TID PC  . levothyroxine  62.5 mcg Oral QAC breakfast  . losartan  50 mg Oral QPM  . metoprolol  50 mg Oral QPM  . multivitamin with minerals  1 tablet Oral Daily  . mupirocin ointment   Nasal BID  . pantoprazole  40 mg Oral Daily  . polycarbophil  625 mg Oral BID  . simvastatin  20 mg Oral QHS  . vitamin C  1,000 mg Oral Daily    Continuous Infusions: . dextrose 5 % and 0.45 % NaCl with KCl 20 mEq/L 50 mL/hr at 05/28/12 2354  . lactated ringers 50 mL/hr at 05/29/12 1620    Past Medical History  Diagnosis  Date  . IBS (irritable bowel syndrome)   . Hypothyroid   . GERD (gastroesophageal reflux disease)   . Hypertension   . Allergic rhinitis   . Asthma   . Complication of anesthesia     confusion after 04/2012    Past Surgical History  Procedure Laterality Date  . Mastectomy      double  . Bladder surgery    . Nasal sinus surgery    . Hip arthroplasty Left 05/04/2012    Procedure: ARTHROPLASTY BIPOLAR HIP;  Surgeon: Shelda Pal, MD;  Location: [facility];  Service: Orthopedics;  Laterality: Left;  . Closed reduction wrist fracture Left 05/04/2012    Procedure: CLOSED REDUCTION WRIST;  Surgeon: Shelda Pal, MD;  Location: [facility];  Service: Orthopedics;  Laterality: Left;  with casting    Loyce Dys, MS RD LDN Clinical Inpatient Dietitian Pager: [phone] Weekend/After hours pager: [phone]

## 2012-05-29 NOTE — Progress Notes (Signed)
R/B/A DISCUSSED WITH PT IN OFFICE.  PT VOICED UNDERSTANDING OF PLAN CONSENT SIGNED DAY OF SURGERY PT SEEN AND EXAMINED PRIOR TO OPERATIVE PROCEDURE/DAY OF SURGERY SITE MARKED. QUESTIONS ANSWERED WILL REMAIN AN INPATIENT FOLLOWING SURGERY 

## 2012-05-29 NOTE — Anesthesia Procedure Notes (Addendum)
Procedure Name: LMA Insertion Date/Time: 05/29/2012 6:03 PM Performed by: MCPHAIL, NANCY S Pre-anesthesia Checklist: Patient identified, Timeout performed, Emergency Drugs available, Suction available and Patient being monitored Patient Re-evaluated:Patient Re-evaluated prior to inductionOxygen Delivery Method: Circle system utilized Preoxygenation: Pre-oxygenation with 100% oxygen Intubation Type: IV induction Ventilation: Mask ventilation without difficulty LMA: LMA inserted LMA Size: 4.0 Tube type: Oral Number of attempts: 1   Anesthesia Regional Block:  Supraclavicular block  Pre-Anesthetic Checklist: ,, timeout performed, Correct Patient, Correct Site, Correct Laterality, Correct Procedure, Correct Position, site marked, Risks and benefits discussed, Surgical consent,  Pre-op evaluation,  Post-op pain management  Laterality: Left  Prep: chloraprep       Needles:  Injection technique: Single-shot  Needle Type: Echogenic Stimulator Needle     Needle Length:cm 9 cm Needle Gauge: 22 and 22 G    Additional Needles:  Procedures: ultrasound guided (picture in chart) Supraclavicular block Narrative:  Start time: 05/29/2012 8:30 PM End time: 05/29/2012 8:35 PM Injection made incrementally with aspirations every 5 mL.  Performed by: Personally   Additional Notes: Due to severe L. Arm pain in PACU, decision made to perform L. Supraclavicular block for post-op analgesia.   25 cc Naropin 0.5% injected easily.

## 2012-05-29 NOTE — Transfer of Care (Signed)
Immediate Anesthesia Transfer of Care Note  Patient: Joanna [name]  Procedure(s) Performed: Procedure(s): OPEN REDUCTION INTERNAL FIXATION (ORIF) DISTAL RADIAL FRACTURE (Left)  Patient Location: PACU  Anesthesia Type:General  Level of Consciousness: awake, alert  and oriented  Airway & Oxygen Therapy: Patient Spontanous Breathing and Patient connected to nasal cannula oxygen  Post-op Assessment: Report given to PACU RN and Post -op Vital signs reviewed and stable  Post vital signs: Reviewed and stable  Complications: No apparent anesthesia complications

## 2012-05-29 NOTE — Anesthesia Postprocedure Evaluation (Signed)
  Anesthesia Post-op Note  Patient: Joanna [name]  Procedure(s) Performed: Procedure(s): OPEN REDUCTION INTERNAL FIXATION (ORIF) DISTAL RADIAL FRACTURE (Left)  Patient Location: PACU  Anesthesia Type:General  Level of Consciousness: awake, alert  and oriented  Airway and Oxygen Therapy: Patient Spontanous Breathing and Patient connected to nasal cannula oxygen  Post-op Pain: mild  Post-op Assessment: Post-op Vital signs reviewed, Patient's Cardiovascular Status Stable, Respiratory Function Stable, Patent Airway and Pain level controlled  Post-op Vital Signs: stable  Complications: No apparent anesthesia complications

## 2012-05-29 NOTE — Clinical Social Work Psychosocial (Signed)
Clinical Social Work Department  BRIEF PSYCHOSOCIAL ASSESSMENT  Patient: Joanna [name]  Account Number: [id number]   Admit date: 05/28/12 Clinical Social Worker Sabino Niemann, MSW Date/Time: 05/29/2012 Referred by: Physician Date Referred: 05/28/12 Referred for   Return to SNFt   Other Referral:  Interview type: Patient  Other interview type: PSYCHOSOCIAL DATA  Living Status: Admitted from facility: [facility]  Level of care: SNF Primary support name: Hamilton,Evelyn  Primary support relationship to patient:  Relative Degree of support available:  Fair  CURRENT CONCERNS  Current Concerns   Post-Acute Placement   Other Concerns:  SOCIAL WORK ASSESSMENT / PLAN  CSW met with pt re: PT recommendation for SNF.   Patient is from [facility] and can return in the am to the facility      CSW completed FL2 and sent clinicals to [facility].     Assessment/plan status: Information/Referral to [facility]  Other assessment/ plan:  Information/referral to community resources:  SNF     PATIENT'S/FAMILY'S RESPONSE TO PLAN OF CARE:  Pt  reports she is agreeable to returning to [facility] in order to increase strength and independence with mobility prior to returning home. SW confirmed with the facility that the patient can return on 05/30/12  Pt verbalized understanding of placement process and appreciation for CSW assist.   Sabino Niemann, MSW [phone]

## 2012-05-29 NOTE — Preoperative (Signed)
Beta Blockers   Reason not to administer Beta Blockers:Metoprolol given at 2122 hrs on 05/28/12 per [facility]

## 2012-05-30 ENCOUNTER — Other Ambulatory Visit: Payer: Self-pay | Admitting: *Deleted

## 2012-05-30 DIAGNOSIS — M810 Age-related osteoporosis without current pathological fracture: Secondary | ICD-10-CM | POA: Diagnosis not present

## 2012-05-30 DIAGNOSIS — E039 Hypothyroidism, unspecified: Secondary | ICD-10-CM | POA: Diagnosis not present

## 2012-05-30 DIAGNOSIS — M6281 Muscle weakness (generalized): Secondary | ICD-10-CM | POA: Diagnosis not present

## 2012-05-30 DIAGNOSIS — D649 Anemia, unspecified: Secondary | ICD-10-CM | POA: Diagnosis not present

## 2012-05-30 DIAGNOSIS — Z471 Aftercare following joint replacement surgery: Secondary | ICD-10-CM | POA: Diagnosis not present

## 2012-05-30 DIAGNOSIS — S72009D Fracture of unspecified part of neck of unspecified femur, subsequent encounter for closed fracture with routine healing: Secondary | ICD-10-CM | POA: Diagnosis not present

## 2012-05-30 DIAGNOSIS — J479 Bronchiectasis, uncomplicated: Secondary | ICD-10-CM | POA: Diagnosis not present

## 2012-05-30 DIAGNOSIS — Q442 Atresia of bile ducts: Secondary | ICD-10-CM | POA: Diagnosis not present

## 2012-05-30 DIAGNOSIS — J449 Chronic obstructive pulmonary disease, unspecified: Secondary | ICD-10-CM | POA: Diagnosis not present

## 2012-05-30 DIAGNOSIS — S52599A Other fractures of lower end of unspecified radius, initial encounter for closed fracture: Secondary | ICD-10-CM | POA: Diagnosis not present

## 2012-05-30 DIAGNOSIS — S52209A Unspecified fracture of shaft of unspecified ulna, initial encounter for closed fracture: Secondary | ICD-10-CM | POA: Diagnosis not present

## 2012-05-30 DIAGNOSIS — K219 Gastro-esophageal reflux disease without esophagitis: Secondary | ICD-10-CM | POA: Diagnosis not present

## 2012-05-30 DIAGNOSIS — E876 Hypokalemia: Secondary | ICD-10-CM | POA: Diagnosis not present

## 2012-05-30 DIAGNOSIS — D509 Iron deficiency anemia, unspecified: Secondary | ICD-10-CM | POA: Diagnosis not present

## 2012-05-30 DIAGNOSIS — J45909 Unspecified asthma, uncomplicated: Secondary | ICD-10-CM | POA: Diagnosis not present

## 2012-05-30 DIAGNOSIS — S42309S Unspecified fracture of shaft of humerus, unspecified arm, sequela: Secondary | ICD-10-CM | POA: Diagnosis not present

## 2012-05-30 DIAGNOSIS — I10 Essential (primary) hypertension: Secondary | ICD-10-CM | POA: Diagnosis not present

## 2012-05-30 DIAGNOSIS — S5290XB Unspecified fracture of unspecified forearm, initial encounter for open fracture type I or II: Secondary | ICD-10-CM | POA: Diagnosis not present

## 2012-05-30 DIAGNOSIS — E7800 Pure hypercholesterolemia, unspecified: Secondary | ICD-10-CM | POA: Diagnosis not present

## 2012-05-30 DIAGNOSIS — K5900 Constipation, unspecified: Secondary | ICD-10-CM | POA: Diagnosis not present

## 2012-05-30 DIAGNOSIS — R41841 Cognitive communication deficit: Secondary | ICD-10-CM | POA: Diagnosis not present

## 2012-05-30 DIAGNOSIS — S59909A Unspecified injury of unspecified elbow, initial encounter: Secondary | ICD-10-CM | POA: Diagnosis not present

## 2012-05-30 DIAGNOSIS — S62109A Fracture of unspecified carpal bone, unspecified wrist, initial encounter for closed fracture: Secondary | ICD-10-CM | POA: Diagnosis not present

## 2012-05-30 DIAGNOSIS — K589 Irritable bowel syndrome without diarrhea: Secondary | ICD-10-CM | POA: Diagnosis not present

## 2012-05-30 LAB — PROTIME-INR: Prothrombin Time: 19.2 seconds — ABNORMAL HIGH (ref 11.6–15.2)

## 2012-05-30 MED ORDER — FENTANYL CITRATE 0.05 MG/ML IJ SOLN: 25.0000 ug | INTRAMUSCULAR | Status: DC | PRN

## 2012-05-30 MED ORDER — ACETAMINOPHEN 10 MG/ML IV SOLN: 1000.0000 mg | Freq: Once | INTRAVENOUS | Status: DC | PRN

## 2012-05-30 MED ORDER — VITAMIN C 500 MG PO TABS: 500.0000 mg | ORAL_TABLET | Freq: Every day | ORAL | Status: DC

## 2012-05-30 MED ORDER — DOCUSATE SODIUM 100 MG PO CAPS: 100.0000 mg | ORAL_CAPSULE | Freq: Two times a day (BID) | ORAL | Status: DC

## 2012-05-30 MED ORDER — OXYCODONE-ACETAMINOPHEN 5-325 MG PO TABS: 1.0000 | ORAL_TABLET | ORAL | Status: DC | PRN

## 2012-05-30 MED ORDER — METOCLOPRAMIDE HCL 5 MG/ML IJ SOLN: 10.0000 mg | Freq: Once | INTRAMUSCULAR | Status: DC | PRN

## 2012-05-30 MED ORDER — OXYCODONE-ACETAMINOPHEN 5-325 MG PO TABS: ORAL_TABLET | ORAL | Status: DC

## 2012-05-30 NOTE — Discharge Summary (Signed)
Physician Discharge Summary  Patient ID: Joanna [name] MRN: [id number] DOB/AGE: 77-Oct-1930 77 y.o.  Admit date: 05/28/2012 Discharge date: 05/30/2012  Admission Diagnoses: Left Distal Radius Fracture Past Medical History  Diagnosis Date  . IBS (irritable bowel syndrome)   . Hypothyroid   . GERD (gastroesophageal reflux disease)   . Hypertension   . Allergic rhinitis   . Asthma   . Complication of anesthesia     confusion after 04/2012    Discharge Diagnoses:  Left distal radius fracture  Surgeries: Procedure(s): OPEN REDUCTION INTERNAL FIXATION (ORIF) DISTAL RADIAL FRACTURE on 05/28/2012 - 05/29/2012    Consultants:  none  Discharged Condition: Improved  Hospital Course: DARNEISHA WINDHORST is an 79 y.o. female who was admitted 05/28/2012 with a chief complaint of No chief complaint on file. , and found to have a diagnosis of Left Distal Radius Fracture.  They were brought to the operating room on 05/28/2012 - 05/29/2012 and underwent Procedure(s): OPEN REDUCTION INTERNAL FIXATION (ORIF) DISTAL RADIAL FRACTURE.    They were given perioperative antibiotics: Anti-infectives   Start     Dose/Rate Route Frequency Ordered Stop   05/29/12 1830  clindamycin (CLEOCIN) IVPB 600 mg     600 mg 100 mL/hr over 30 Minutes Intravenous  Once 05/29/12 1825 05/30/12 0021   05/29/12 0600  clindamycin (CLEOCIN) IVPB 900 mg  Status:  Discontinued     900 mg 100 mL/hr over 30 Minutes Intravenous On call to O.R. 05/28/12 1543 05/28/12 1831    .  They were given sequential compression devices, early ambulation, and Coumadin and Other (comment) for DVT prophylaxis.  Recent vital signs: Patient Vitals for the past 24 hrs:  BP Temp Temp src Pulse Resp SpO2  05/30/12 0628 161/84 mmHg 98.4 F (36.9 C) Oral 71 18 98 %  05/30/12 0026 143/47 mmHg 98.4 F (36.9 C) Oral 62 18 99 %  05/29/12 2120 161/59 mmHg 98.2 F (36.8 C) Oral 84 16 99 %  05/29/12 2100 172/72 mmHg 98.7 F (37.1 C) - 91 18 98 %   05/29/12 2045 178/65 mmHg - - - - -  05/29/12 2015 171/72 mmHg - - - - -  05/29/12 2000 171/74 mmHg - - - - -  05/29/12 1935 112/82 mmHg 97.7 F (36.5 C) - 70 14 100 %  05/29/12 1352 138/56 mmHg 98 F (36.7 C) - 66 16 97 %  05/29/12 0846 - - - - - 100 %  .  Recent laboratory studies: No results found.  Discharge Medications:     Medication List    TAKE these medications       amLODipine 5 MG tablet  Commonly known as:  NORVASC  Take 2.5 mg by mouth every evening.     aspirin 81 MG tablet  Take 81 mg by mouth every other day.     b complex vitamins tablet  Take 1 tablet by mouth every morning.     budesonide-formoterol 160-4.5 MCG/ACT inhaler  Commonly known as:  SYMBICORT  Inhale 2 puffs into the lungs 2 (two) times daily.     calcium carbonate 1250 MG tablet  Commonly known as:  OS-CAL - dosed in mg of elemental calcium  Take 1 tablet by mouth every morning.     cholecalciferol 1000 UNITS tablet  Commonly known as:  VITAMIN D  Take 1,000 Units by mouth daily.     docusate sodium 100 MG capsule  Commonly known as:  COLACE  Take 1 capsule (100 mg  total) by mouth 2 (two) times daily.     ferrous sulfate 325 (65 FE) MG tablet  Take 1 tablet (325 mg total) by mouth 3 (three) times daily after meals.     flurbiprofen 100 MG tablet  Commonly known as:  ANSAID  Take 100 mg by mouth daily as needed (pain).     gabapentin 300 MG capsule  Commonly known as:  NEURONTIN  Take 600 mg by mouth daily. Patient takes one 300mg  capsule if pain is not too bad. If pain continues to get severe she will take another capsule equal up to 600mg      glucosamine-chondroitin 500-400 MG tablet  Take 1 tablet by mouth 3 (three) times a week.     HYDROcodone-acetaminophen 5-325 MG per tablet  Commonly known as:  NORCO/VICODIN  Take 1 tablet by mouth every 6 (six) hours as needed for pain. For pain     levothyroxine 125 MCG tablet  Commonly known as:  SYNTHROID, LEVOTHROID  Take  62.5 mcg by mouth at bedtime.     losartan 50 MG tablet  Commonly known as:  COZAAR  Take 50 mg by mouth every evening.     metoprolol 50 MG tablet  Commonly known as:  LOPRESSOR  Take 50 mg by mouth every evening.     multivitamin with minerals Tabs  Take 1 tablet by mouth every morning.     omalizumab 150 MG injection  Commonly known as:  XOLAIR  Inject 150 mg into the skin every 28 (twenty-eight) days. Administered by Dr. Shelle Iron ([phone]) [phone]     omeprazole 20 MG capsule  Commonly known as:  PRILOSEC  Take 20 mg by mouth every morning.     oxyCODONE-acetaminophen 5-325 MG per tablet  Commonly known as:  PERCOCET/ROXICET  Take one to two tablets by mouth every 4 to 6 hours as needed for pain.     oxyCODONE-acetaminophen 5-325 MG per tablet  Commonly known as:  ROXICET  Take 1 tablet by mouth every 4 (four) hours as needed for pain.     PROAIR HFA 108 (90 BASE) MCG/ACT inhaler  Generic drug:  albuterol  Inhale 2 puffs into the lungs every 6 (six) hours as needed for wheezing or shortness of breath.     simvastatin 20 MG tablet  Commonly known as:  ZOCOR  Take 20 mg by mouth every evening.     vitamin C 500 MG tablet  Commonly known as:  ASCORBIC ACID  Take 1 tablet (500 mg total) by mouth daily.     vitamin C 500 MG tablet  Commonly known as:  ASCORBIC ACID  Take 500 mg by mouth daily.     warfarin 7.5 MG tablet  Commonly known as:  COUMADIN  Take 0.5 tablets (3.75 mg total) by mouth one time only at 6 PM.        Diagnostic Studies: Dg Chest 1 View  05/04/2012   *RADIOLOGY REPORT*  Clinical Data: Status post fall; history of asthma.  Concern for chest injury.  CHEST - 1 VIEW  Comparison: Chest radiograph performed 02/23/2011, and CTA of the chest performed 03/27/2010  Findings: The lungs are well-aerated.  Mild chronically increased interstitial markings are seen.  Bilateral densities reflect overlying soft tissues.  There is no evidence of focal opacification,  pleural effusion or pneumothorax.  The cardiomediastinal silhouette is normal in size.  Calcification is noted within the aortic arch.  No acute osseous abnormalities are seen.  IMPRESSION: Mild chronic lung  changes noted.  No acute cardiopulmonary process seen.  No displaced rib fractures identified.   Original Report Authenticated By: Tonia Ghent, M.D.   Dg Chest 2 View  05/06/2012   *RADIOLOGY REPORT*  Clinical Data: Fever and cough.  CHEST - 2 VIEW  Comparison: 05/04/2012 and 02/23/2011  Findings: The patient has new small bilateral pleural effusions. The lungs are hyperinflated consistent with emphysema.  Heart size and pulmonary vascularity are normal.  No consolidative infiltrates.  No acute osseous abnormality.  IMPRESSION: New small bilateral pleural effusions.  COPD.   Original Report Authenticated By: Francene Boyers, M.D.   Dg Forearm Left  05/04/2012   *RADIOLOGY REPORT*  Clinical Data: Distal forearm pain and bruising post fall  LEFT FOREARM - 2 VIEW  Comparison: None  Findings: Minimally distracted ulnar styloid fracture. Transverse metaphyseal fracture distal left radius, minimally comminuted, with apex volar angulation. No definite intrarticular extension. Diffuse osseous demineralization. No additional fracture, dislocation or bone destruction.  IMPRESSION: Displaced ulnar styloid fracture. Mildly displaced and angulated comminuted distal left radial metaphyseal fracture.   Original Report Authenticated By: Ulyses Southward, M.D.   Dg Hip Complete Left  05/04/2012   *RADIOLOGY REPORT*  Clinical Data: Hip pain.  LEFT HIP - COMPLETE 2+ VIEW  Comparison: None.  Findings: There appears to be a mildly displaced transcervical fracture of the left femoral neck, difficult to fully characterize. The left femoral head remains seated at the acetabulum.  The right hip joint is unremarkable in appearance.  Mild degenerative change is noted at the lower lumbar spine.  The sacroiliac joints are unremarkable in  appearance.  The visualized bowel gas pattern is grossly unremarkable in appearance.  Scattered phleboliths are noted within the pelvis.  IMPRESSION: Mildly displaced transcervical fracture of the left femoral neck.   Original Report Authenticated By: Tonia Ghent, M.D.   Dg Pelvis Portable  05/04/2012   *RADIOLOGY REPORT*  Clinical Data: Postop left hip surgery.  PORTABLE PELVIS  Comparison: 04/03/2012  Findings: Changes of left hip replacement.  Soft tissue drain in place.  Normal AP alignment.  No hardware or bony complicating feature.  Mild degenerative changes in the right hip.  IMPRESSION: Left hip replacement.  No complicating feature.   Original Report Authenticated By: Charlett Nose, M.D.    They benefited maximally from their hospital stay and there were no complications.     Disposition: 03-Skilled Nursing Facility      Future Appointments Provider Department Dept Phone   06/18/2012 1:30 PM Barbaraann Share, MD [facility] [phone]     Follow-up Information   Schedule an appointment as soon as possible for a visit with Sharma Covert, MD.   Contact information:   [address] [place] [address] [phone]      PT IS NONWEIGHT BEARING ON LEFT WRIST KEEP HAND ELEVATED F/U IN [facility] OFFICE 2 WEEKS AFTER SURGERY  Signed: Sharma Covert 05/30/2012, 7:17 AM

## 2012-05-30 NOTE — Progress Notes (Signed)
Clinical social worker assisted with patient discharge to skilled nursing facility, [facility].  CSW addressed all family questions and concerns. CSW copied chart and added all important documents. CSW also set up patient transportation with [employer]. Clinical Social Worker will sign off for now as social work intervention is no longer needed.   Sabino Niemann, MSW, [phone]

## 2012-05-30 NOTE — Op Note (Signed)
NAMEMarland Kitchen  Joanna [name], Joanna [name] NO.:  [id number]  MEDICAL RECORD NO.:  [id number]  LOCATION:  5N15C                        FACILITY:  [facility]  PHYSICIAN:  Madelynn Done, MD  DATE OF BIRTH:  1929-01-09  DATE OF PROCEDURE:  05/29/2012 DATE OF DISCHARGE:                              OPERATIVE REPORT   PREOPERATIVE DIAGNOSIS:  Left intra-articular distal radius fracture, 2 or more fragments.  POSTOPERATIVE DIAGNOSIS:  Left intra-articular distal radius fracture, 2 or more fragments.  ATTENDING PHYSICIAN:  Madelynn Done, MD, who scrubbed and present for the entire procedure.  ASSISTANT SURGEON:  None.  ANESTHESIA:  General via LMA.  TOURNIQUET TIME:  Less than 1 hour at 250 mmHg.  SURGICAL PROCEDURE: 1. Open treatment of left wrist intra-articular distal radius     fracture, 2 or more fragments. 2. Left wrist, brachioradialis, tendon lengthening and release. 3. Radiographs, 3 views, left wrist.  SURGICAL INDICATIONS:  Ms. Brotz is a right-hand-dominant female, who sustained a left hip fracture and distal radius fracture.  She underwent closed manipulation and this was unsuccessful in maintaining the reduction.  The patient was brought to surgery to restore the overall length and position of her distal radius.  Risks, benefits, and alternatives were discussed in detail with the patient.  Signed informed consent was obtained.  Risks include, but not limited to bleeding, infection, damage to nearby nerves, arteries, or tendons, loss of motion of elbow, wrist, and digits, nonunion, malunion, hardware failure, and need for further surgical intervention.  SURGICAL IMPLANTS:  Biomet and new DVR plate with 6 distal locking pegs and 5 screws, combination of locking and nonlocking screws proximally.  DESCRIPTION OF PROCEDURE:  The patient was appropriately identified in the preop holding area and mark with a permanent marker made on the left wrist to indicate the  correct operative site.  The patient was then brought back to the operating room, placed supine on anesthesia room table.  General anesthesia was administered.  The patient received preoperative antibiotics prior to skin incision.  Left upper extremity was then prepped and draped in normal sterile fashion.  After tourniquet events applied on the left brachium, time-out was called, correct side was identified and procedure then begun.  The limb was then elevated and tourniquet insufflated.  Longitudinal incision made directly over the FCR.  Dissection was then carried down through the skin and subcutaneous tissue.  Going through the floor of the FCR, the pronator quadratus, an L-shaped flap was then carried out and the pronator quadratus of the FPL was then swept aside.  Dissection was then carried down all the way to the fracture site where the early fracture callus was then taken down with a small rongeurs.  In order to obtain reduction, brachioradialis tendon was then carefully identified, lengthened and released.  Forearm tenotomy was then carried out to release the distal fragment.  This allowed the fragment to come out to length very nicely.  Once this was carried out, the volar plate was then applied.  It was held temporarily in place with a K-wire distally.  Oblong screw hole was then used to approximate with the appropriate drill.  Depth  gauge measurement placed the screw in the oblong hole plate.  Height was then adjusted after K- wire had been removed.  Distal fixation was then carried out with the appropriate drill bit and the distal locking pegs were then placed from an ulnar to radial direction.  The wound was then irrigated.  Further proximal fixation was then carried out with a combination of locking and nonlocking screws.  This was an intra-articular fracture of 2 or more fragments which reduced very nicely after take down.  Further callus in open reduction.  Final  radiographs were obtained, 3 views of the wrist were then obtained which showed near anatomical position and good alignment.  Following this, the pronator quadratus was then closed with a 2-0 Vicryl suture.  Subcutaneous tissue was closed with 4-0 Vicryl and the skin closed with simple 4-0 Prolene.  Tourniquet had been deflated. Hemostasis was obtained.  A 10 mL of 0.25% Marcaine infiltrated locally. Adaptic dressing, sterile compressive bandage was then applied.  The patient was then placed in a well-padded short-arm splint, extubated, and taken to recovery room in good condition.  POSTPROCEDURE PLAN:  The patient will be admitted for IV antibiotics, pain control, discharged back to care facility in the morning, seen back in the office in approximately 2 weeks for wound check, suture removal, application of short-arm cast for total 4 weeks, x-rays out of the splint next visit and then begin a therapy regimen at the 4 week mark, nonweightbearing in the left upper extremity.     Madelynn Done, MD     FWO/MEDQ  D:  05/29/2012  T:  05/30/2012  Job:  [id number]

## 2012-06-02 ENCOUNTER — Non-Acute Institutional Stay (SKILLED_NURSING_FACILITY): Payer: Medicare Other | Admitting: Internal Medicine

## 2012-06-02 ENCOUNTER — Other Ambulatory Visit: Payer: Self-pay | Admitting: *Deleted

## 2012-06-02 DIAGNOSIS — K5900 Constipation, unspecified: Secondary | ICD-10-CM | POA: Diagnosis not present

## 2012-06-02 DIAGNOSIS — S52202S Unspecified fracture of shaft of left ulna, sequela: Secondary | ICD-10-CM

## 2012-06-02 DIAGNOSIS — J449 Chronic obstructive pulmonary disease, unspecified: Secondary | ICD-10-CM

## 2012-06-02 DIAGNOSIS — E039 Hypothyroidism, unspecified: Secondary | ICD-10-CM

## 2012-06-02 DIAGNOSIS — S42309S Unspecified fracture of shaft of humerus, unspecified arm, sequela: Secondary | ICD-10-CM

## 2012-06-02 DIAGNOSIS — J4489 Other specified chronic obstructive pulmonary disease: Secondary | ICD-10-CM

## 2012-06-02 MED ORDER — OXYCODONE-ACETAMINOPHEN 5-325 MG PO TABS: ORAL_TABLET | ORAL | Status: DC

## 2012-06-04 ENCOUNTER — Encounter (HOSPITAL_COMMUNITY): Payer: Self-pay | Admitting: Orthopedic Surgery

## 2012-06-13 ENCOUNTER — Telehealth: Payer: Self-pay | Admitting: Pulmonary Disease

## 2012-06-13 DIAGNOSIS — S52599A Other fractures of lower end of unspecified radius, initial encounter for closed fracture: Secondary | ICD-10-CM | POA: Diagnosis not present

## 2012-06-13 NOTE — Telephone Encounter (Signed)
Spoke with pt and notified of recs per [facility] He verbalized understanding and nothing further needed

## 2012-06-13 NOTE — Telephone Encounter (Signed)
yes

## 2012-06-18 ENCOUNTER — Encounter: Payer: Self-pay | Admitting: Pulmonary Disease

## 2012-06-18 ENCOUNTER — Ambulatory Visit (INDEPENDENT_AMBULATORY_CARE_PROVIDER_SITE_OTHER): Payer: Medicare Other | Admitting: Pulmonary Disease

## 2012-06-18 ENCOUNTER — Ambulatory Visit (INDEPENDENT_AMBULATORY_CARE_PROVIDER_SITE_OTHER): Payer: No Typology Code available for payment source

## 2012-06-18 VITALS — BP 148/78 | HR 74 | Temp 98.1°F | Ht 67.0 in | Wt 125.4 lb

## 2012-06-18 DIAGNOSIS — J479 Bronchiectasis, uncomplicated: Secondary | ICD-10-CM

## 2012-06-18 DIAGNOSIS — J449 Chronic obstructive pulmonary disease, unspecified: Secondary | ICD-10-CM

## 2012-06-18 DIAGNOSIS — J45909 Unspecified asthma, uncomplicated: Secondary | ICD-10-CM

## 2012-06-18 MED ORDER — OMALIZUMAB 150 MG ~~LOC~~ SOLR
150.0000 mg | Freq: Once | SUBCUTANEOUS | Status: AC
  Administered 2012-06-18: 150 mg via SUBCUTANEOUS

## 2012-06-18 NOTE — Progress Notes (Signed)
  Subjective:    Patient ID: Joanna [name], female    DOB: 05-Jun-1928, 79 y.o.   MRN: [id number]  HPI The patient comes in today for followup of her known chronic obstructive asthma.  She's been staying on her symbicort, but has missed some solar doses because of a recent setback with an arm and pelvic fracture after a fall.  She is currently in a rehabilitation facility, and making improvements.  She feels that her breathing is a little bit worse than baseline, and suspects that it is because of allergies.  She is not taking an antihistamine.  She denies any significant chest congestion or purulent mucus.   Review of Systems  Constitutional: Negative for fever and unexpected weight change.  HENT: Positive for rhinorrhea and postnasal drip. Negative for ear pain, nosebleeds, congestion, sore throat, sneezing, trouble swallowing, dental problem and sinus pressure.        Allergies  Eyes: Negative for redness and itching.  Respiratory: Positive for cough and shortness of breath. Negative for chest tightness and wheezing.   Cardiovascular: Negative for palpitations and leg swelling.  Gastrointestinal: Negative for nausea and vomiting.  Genitourinary: Negative for dysuria.  Musculoskeletal: Negative for joint swelling.  Skin: Negative for rash.  Neurological: Negative for headaches.  Hematological: Does not bruise/bleed easily.  Psychiatric/Behavioral: Negative for dysphoric mood. The patient is not nervous/anxious.        Objective:   Physical Exam Thin female in no acute distress Nose with purulent discharge noted Neck without lymphadenopathy or thyromegaly Chest with totally clear breath sounds, no wheezing Cardiac exam with regular rate and rhythm Lower extremities with no significant edema, no cyanosis Alert and oriented, moves all 4 extremities.       Assessment & Plan:

## 2012-06-18 NOTE — Patient Instructions (Addendum)
Stay on symbicort and xolair Try zyrtec 10mg  one at bedtime each night for allergies. followup with me again in 4 mos.

## 2012-06-18 NOTE — Assessment & Plan Note (Signed)
The patient has been doing well from an asthma standpoint, but notices a slight increase in symptoms recently.  I suspect this is related to her recent fractures with debility, missing Xolair injections, and also from allergens.  I've asked her to get back on her shots on a consistent basis, and also to try Zyrtec.  She is to call me if she has worsening symptoms.

## 2012-06-20 DIAGNOSIS — S52599A Other fractures of lower end of unspecified radius, initial encounter for closed fracture: Secondary | ICD-10-CM | POA: Diagnosis not present

## 2012-06-23 ENCOUNTER — Non-Acute Institutional Stay (SKILLED_NURSING_FACILITY): Payer: Medicare Other | Admitting: Internal Medicine

## 2012-06-23 DIAGNOSIS — K5900 Constipation, unspecified: Secondary | ICD-10-CM | POA: Insufficient documentation

## 2012-06-23 DIAGNOSIS — I10 Essential (primary) hypertension: Secondary | ICD-10-CM

## 2012-06-23 DIAGNOSIS — M810 Age-related osteoporosis without current pathological fracture: Secondary | ICD-10-CM | POA: Diagnosis not present

## 2012-06-23 DIAGNOSIS — D509 Iron deficiency anemia, unspecified: Secondary | ICD-10-CM

## 2012-06-23 DIAGNOSIS — E039 Hypothyroidism, unspecified: Secondary | ICD-10-CM | POA: Diagnosis not present

## 2012-06-23 NOTE — Progress Notes (Signed)
Patient ID: GIAVANNA KANG, female   DOB: 1928-06-08, 79 y.o.   MRN: [id number]        HISTORY & PHYSICAL  DATE: 06/02/2012   FACILITY: [facility]  LEVEL OF CARE: SNF (31)  ALLERGIES:  Allergies  Allergen Reactions  . Cefuroxime Axetil Hives  . Lactose Intolerance (Gi) Other (See Comments)    Gi upset  . Prednisone Other (See Comments)    Pt unsure of reaction  . Clarithromycin Palpitations  . Codeine Palpitations  . Sulfonamide Derivatives Palpitations    CHIEF COMPLAINT:  Manage left distal radius fracture, hypothyroidism, and asthma.    HISTORY OF PRESENT ILLNESS:  The patient is an 79 year-old, Caucasian female.    LEFT DISTAL RADIUS FRACTURE:  The patient had fractured her left distal radius and therefore underwent ORIF and tolerated the procedure well.  She is readmitted back to the facility for short-term rehabilitation.  Her left upper extremity is in a cast now.  She denies left upper extremity pain.    HYPOTHYROIDISM: The hypothyroidism remains stable. No complications noted from the medications presently being used.  The patient denies fatigue or constipation.  Last TSH:  A recent TSH is not available.     ASTHMA: The patient's asthma remains stable. Patient denies shortness of breath, dyspnea on exertion or wheezing. No complications reported from the medications currently being used.   PAST MEDICAL HISTORY :  Past Medical History  Diagnosis Date  . IBS (irritable bowel syndrome)   . Hypothyroid   . GERD (gastroesophageal reflux disease)   . Hypertension   . Allergic rhinitis   . Asthma   . Complication of anesthesia     confusion after 04/2012    PAST SURGICAL HISTORY: Past Surgical History  Procedure Laterality Date  . Mastectomy      double  . Bladder surgery    . Nasal sinus surgery    . Hip arthroplasty Left 05/04/2012    Procedure: ARTHROPLASTY BIPOLAR HIP;  Surgeon: Shelda Pal, MD;  Location: [facility] ORS;  Service: Orthopedics;   Laterality: Left;  . Closed reduction wrist fracture Left 05/04/2012    Procedure: CLOSED REDUCTION WRIST;  Surgeon: Shelda Pal, MD;  Location: [facility] OR[facility];  Service: Orthopedics;  Laterality: Left;  with casting  . Open reduction internal fixation (orif) distal radial fracture Left 05/29/2012    Procedure: OPEN REDUCTION INTERNAL FIXATION (ORIF) DISTAL RADIAL FRACTURE;  Surgeon: Sharma Covert, MD;  Location: [facility] OR;  Service: Orthopedics;  Laterality: Left;    SOCIAL HISTORY:  reports that she has never smoked. She has never used smokeless tobacco. She reports that she does not drink alcohol or use illicit drugs.  FAMILY HISTORY:  Family History  Problem Relation Age of Onset  . CAD Mother   . Hypertension Father   . Breast cancer Sister     CURRENT MEDICATIONS: Reviewed per [facility]  REVIEW OF SYSTEMS:   GI:  Complains of constipation.    See HPI otherwise 14 point ROS is negative.  PHYSICAL EXAMINATION  VS:  T 96.8       P 62      RR 20      BP 138/60       POX%        WT (Lb)  GENERAL: no acute distress, normal body habitus EYES: conjunctivae normal, sclerae normal, normal eye lids MOUTH/THROAT: lips without lesions,no lesions in the mouth,tongue is without lesions,uvula elevates in midline NECK:  supple, trachea midline, no neck masses, no thyroid tenderness, no thyromegaly LYMPHATICS: no LAN in the neck, no supraclavicular LAN RESPIRATORY: breathing is even & unlabored, BS CTAB CARDIAC: RRR, no murmur,no extra heart sounds, no edema GI:  ABDOMEN: abdomen soft, normal BS, no masses, no tenderness  LIVER/SPLEEN: no hepatomegaly, no splenomegaly MUSCULOSKELETAL: HEAD: normal to inspection & palpation BACK: no kyphosis, scoliosis or spinal processes tenderness EXTREMITIES: LEFT UPPER EXTREMITY: not tested due to cast RIGHT UPPER EXTREMITY:  full range of motion, normal strength & tone LEFT LOWER EXTREMITY: strength decreased, range of motion unable to perform RIGHT LOWER  EXTREMITY:  full range of motion, normal strength & tone PSYCHIATRIC: the patient is alert & oriented to person, affect & behavior appropriate  LABS/RADIOLOGY: Hemoglobin 11.4, MCV 85.7, otherwise CBC normal.   PTT 36, PT 22, INR 2.01.  Glucose 100, otherwise BMP normal.    Left forearm x-ray:  Showed displaced ulnar styloid fracture.  Mildly displaced and angulated comminuted distal left radial metaphyseal fracture.    Chest x-ray:  No acute disease.   ASSESSMENT/PLAN:  Left distal radius fracture.  Status post ORIF.  Continue rehabilitation.    Hypothyroidism.  Continue levothyroxine.    Asthma.  Stable.   Constipation.  Uncontrolled problem.  Start MiraLAX 17 g q.d.   Hypertension.  Well controlled.   Check CBC and BMP.   I have reviewed patient's medical records received at admission/from hospitalization.  CPT CODE: 78295

## 2012-06-26 DIAGNOSIS — D509 Iron deficiency anemia, unspecified: Secondary | ICD-10-CM | POA: Insufficient documentation

## 2012-06-26 NOTE — Progress Notes (Signed)
PROGRESS NOTE  DATE: 06/23/2012  FACILITY: Nursing Home Location: [facility]  LEVEL OF CARE: SNF (31)  Routine Visit  CHIEF COMPLAINT:  Manage osteoporosis, hypertension and hypothyroidism  HISTORY OF PRESENT ILLNESS:  REASSESSMENT OF ONGOING PROBLEM(S):  HTN: Pt 's HTN remains stable.  Denies CP, sob, DOE, pedal edema, headaches, dizziness or visual disturbances.  No complications from the medications currently being used.  Last BP : 152/72.  HYPOTHYROIDISM: The hypothyroidism remains stable. No complications noted from the medications presently being used.  The patient denies fatigue or constipation.  Last TSH not available.  OSTEOPOROSIS: The patient's osteoporosis remains stable. The patient denies recent worsening of pain and the range of motion remains stable. No complications noted from the medications presently being used. She had a recent left hip fracture and left distal radius fracture.  PAST MEDICAL HISTORY : Reviewed.  No changes.  CURRENT MEDICATIONS: Reviewed per [facility]  REVIEW OF SYSTEMS:  GENERAL: no change in appetite, no fatigue, no weight changes, no fever, chills or weakness RESPIRATORY: no cough, SOB, DOE, wheezing, hemoptysis CARDIAC: no chest pain, edema or palpitations GI: no abdominal pain, diarrhea, constipation, heart burn, nausea or vomiting  PHYSICAL EXAMINATION  VS:  T 97.8       P 58      RR 20      BP 152/72     POX %     WT (Lb) 135  GENERAL: no acute distress, normal body habitus EYES: conjunctivae normal, sclerae normal, normal eye lids NECK: supple, trachea midline, no neck masses, no thyroid tenderness, no thyromegaly LYMPHATICS: no LAN in the neck, no supraclavicular LAN RESPIRATORY: breathing is even & unlabored, BS CTAB CARDIAC: RRR, no murmur,no extra heart sounds, no edema GI: abdomen soft, normal BS, no masses, no tenderness, no hepatomegaly, no splenomegaly PSYCHIATRIC: the patient is alert & oriented to  person, affect & behavior appropriate  LABS/RADIOLOGY:  4/14 platelets 499 otherwise CBC normal, BMP normal, magnesium 2.3  ASSESSMENT/PLAN:  Hypothyroidism-check TSH. Osteoporosis-continue current medications. Hypertension-last BP elevated. Check BP log. Iron deficiency anemia-continue iron. Constipation-well controlled. Hyperlipidemia-check fasting lipid panel. Left hip fracture-continue rehabilitation. Left distal radius fracture- continue cast and rehabilitation. Check liver profile.  CPT CODE: 16109

## 2012-06-27 DIAGNOSIS — S52599A Other fractures of lower end of unspecified radius, initial encounter for closed fracture: Secondary | ICD-10-CM | POA: Diagnosis not present

## 2012-06-27 DIAGNOSIS — Z471 Aftercare following joint replacement surgery: Secondary | ICD-10-CM | POA: Diagnosis not present

## 2012-07-21 DIAGNOSIS — S52599A Other fractures of lower end of unspecified radius, initial encounter for closed fracture: Secondary | ICD-10-CM | POA: Diagnosis not present

## 2012-07-21 DIAGNOSIS — Z471 Aftercare following joint replacement surgery: Secondary | ICD-10-CM | POA: Diagnosis not present

## 2012-07-28 ENCOUNTER — Non-Acute Institutional Stay (SKILLED_NURSING_FACILITY): Payer: Medicare Other | Admitting: Internal Medicine

## 2012-07-28 DIAGNOSIS — S42309S Unspecified fracture of shaft of humerus, unspecified arm, sequela: Secondary | ICD-10-CM

## 2012-07-28 DIAGNOSIS — J45909 Unspecified asthma, uncomplicated: Secondary | ICD-10-CM

## 2012-07-28 DIAGNOSIS — E7800 Pure hypercholesterolemia, unspecified: Secondary | ICD-10-CM | POA: Diagnosis not present

## 2012-07-28 DIAGNOSIS — S52202S Unspecified fracture of shaft of left ulna, sequela: Secondary | ICD-10-CM

## 2012-07-28 DIAGNOSIS — E039 Hypothyroidism, unspecified: Secondary | ICD-10-CM

## 2012-07-28 NOTE — Progress Notes (Signed)
PROGRESS NOTE  DATE: 07/28/2012   FACILITY: [facility]  LEVEL OF CARE: SNF (31)  Discharge Visit  CHIEF COMPLAINT:  Manage left distal radius fracture and asthma  HISTORY OF PRESENT ILLNESS: I was requested by the social worker to perform face-to-face evaluation for discharge:  Patient was admitted to this facility for short-term rehabilitation after the patient's recent hospitalization.  Patient has completed SNF rehabilitation and therapy has cleared the patient for discharge on 08-01-12.  Reassessment of ongoing problem(s):  Left distal radius fracture-patient is status post ORIF. She has progressed well with therapy. She denies pain in her left hand.  ASTHMA: The patient's asthma remains stable. Patient denies shortness of breath, dyspnea on exertion or wheezing. No complications reported from the medications currently being used.  PAST MEDICAL HISTORY : Reviewed.  No changes.  CURRENT MEDICATIONS: Reviewed per [facility]  REVIEW OF SYSTEMS:  GENERAL: no change in appetite, no fatigue, no weight changes, no fever, chills or weakness RESPIRATORY: no cough, SOB, DOE, wheezing, hemoptysis CARDIAC: no chest pain, edema or palpitations GI: no abdominal pain, diarrhea, constipation, heart burn, nausea or vomiting  PHYSICAL EXAMINATION  VS:  T 97.1       P 62       RR 20      BP 144/64     POX %       WT (Lb) 130  GENERAL: no acute distress, normal body habitus EYES: conjunctivae normal, sclerae normal, normal eye lids NECK: supple, trachea midline, no neck masses, no thyroid tenderness, no thyromegaly LYMPHATICS: no LAN in the neck, no supraclavicular LAN RESPIRATORY: breathing is even & unlabored, BS CTAB CARDIAC: RRR, no murmur,no extra heart sounds, no edema GI: abdomen soft, normal BS, no masses, no tenderness, no hepatomegaly, no splenomegaly PSYCHIATRIC: the patient is alert & oriented to person, affect & behavior appropriate  LABS/RADIOLOGY:  6/14  he will profile normal, TSH 3.49, total cholesterol 209, LDL 103 otherwise fasting lipid panel normal 5/14 CBC and BMP normal 4/14 magnesium 2.3  ASSESSMENT/PLAN:  Left distal radius fracture-status post ORIF. Asthma-well compensated. Hypothyroidism-well-controlled. Hyperlipidemia on-controlled. Hypertension-stable. Constipation-denies ongoing symptoms.  I have filled out patient's discharge paperwork and written prescriptions.  Patient will receive home health PT, OT and CNA. DME provided: Single-point cane, adjustable extended shower bench, 3-in-1 commode (V54.13, PT 13.91, 782.87, 719.7)  Total discharge time: Greater than 30 minutes Discharge time involved coordination of the discharge process with social worker, nursing staff and therapy department. Medical justification for home health services/DME verified.  CPT CODE: 78295

## 2012-08-07 DIAGNOSIS — Z471 Aftercare following joint replacement surgery: Secondary | ICD-10-CM | POA: Diagnosis not present

## 2012-08-07 DIAGNOSIS — Z9181 History of falling: Secondary | ICD-10-CM | POA: Diagnosis not present

## 2012-08-07 DIAGNOSIS — Z5189 Encounter for other specified aftercare: Secondary | ICD-10-CM | POA: Diagnosis not present

## 2012-08-07 DIAGNOSIS — R269 Unspecified abnormalities of gait and mobility: Secondary | ICD-10-CM | POA: Diagnosis not present

## 2012-08-07 DIAGNOSIS — I10 Essential (primary) hypertension: Secondary | ICD-10-CM | POA: Diagnosis not present

## 2012-08-07 DIAGNOSIS — IMO0001 Reserved for inherently not codable concepts without codable children: Secondary | ICD-10-CM | POA: Diagnosis not present

## 2012-08-08 ENCOUNTER — Ambulatory Visit (INDEPENDENT_AMBULATORY_CARE_PROVIDER_SITE_OTHER): Payer: Medicare Other

## 2012-08-08 ENCOUNTER — Ambulatory Visit (INDEPENDENT_AMBULATORY_CARE_PROVIDER_SITE_OTHER): Payer: Medicare Other | Admitting: Endocrinology

## 2012-08-08 ENCOUNTER — Encounter: Payer: Self-pay | Admitting: Endocrinology

## 2012-08-08 VITALS — BP 126/68 | HR 73 | Temp 97.8°F | Resp 10 | Ht 67.5 in | Wt 131.9 lb

## 2012-08-08 DIAGNOSIS — M810 Age-related osteoporosis without current pathological fracture: Secondary | ICD-10-CM

## 2012-08-08 DIAGNOSIS — J45909 Unspecified asthma, uncomplicated: Secondary | ICD-10-CM | POA: Diagnosis not present

## 2012-08-08 DIAGNOSIS — E039 Hypothyroidism, unspecified: Secondary | ICD-10-CM

## 2012-08-08 DIAGNOSIS — G609 Hereditary and idiopathic neuropathy, unspecified: Secondary | ICD-10-CM | POA: Diagnosis not present

## 2012-08-08 DIAGNOSIS — I10 Essential (primary) hypertension: Secondary | ICD-10-CM

## 2012-08-08 LAB — COMPREHENSIVE METABOLIC PANEL
ALT: 19 U/L (ref 0–35)
AST: 23 U/L (ref 0–37)
Albumin: 3.7 g/dL (ref 3.5–5.2)
BUN: 15 mg/dL (ref 6–23)
CO2: 28 mEq/L (ref 19–32)
Calcium: 8.9 mg/dL (ref 8.4–10.5)
Chloride: 99 mEq/L (ref 96–112)
GFR: 62.92 mL/min (ref >60.00)
Potassium: 4.6 mEq/L (ref 3.5–5.1)

## 2012-08-08 LAB — CBC WITH DIFFERENTIAL/PLATELET
Basophils Relative: 0.4 % (ref 0.0–3.0)
Eosinophils Relative: 0.8 % (ref 0.0–5.0)
HCT: 36.5 % (ref 36.0–46.0)
Hemoglobin: 12.1 g/dL (ref 12.0–15.0)
Lymphs Abs: 2.1 10*3/uL (ref 0.7–4.0)
Monocytes Relative: 6.2 % (ref 3.0–12.0)
Neutro Abs: 6.2 10*3/uL (ref 1.4–7.7)
WBC: 8.9 10*3/uL (ref 4.5–10.5)

## 2012-08-08 LAB — TSH: TSH: 0.81 u[IU]/mL (ref 0.35–5.50)

## 2012-08-08 LAB — LIPID PANEL: Total CHOL/HDL Ratio: 3

## 2012-08-08 LAB — T4, FREE: Free T4: 1.06 ng/dL (ref 0.60–1.60)

## 2012-08-08 NOTE — Progress Notes (Signed)
Patient ID: Joanna [name], female   DOB: [birth year], 79 y.o.   MRN: [id number]  History of Present Illness:  Current problems:  1. HYPERTENSION: She has had mild to moderate hypertension with usually good control and requiring small doses of medications. Not clear when she was started on amlodipine. Also on losartan and metoprolol and  2. History of wrist and vertebral fracture related to a fall. However she has had a previous history of osteoporosis and has not had a bone density in quite sometime. She has been quite noncompliant with taking her Fosamax  3. Has history of hypothyroidism, mild and stable  4. History of idiopathic neuropathy, usually controls symptoms with Neurontin    Medication List       This list is accurate as of: 08/08/12 11:59 PM.  Always use your most recent med list.               amLODipine 5 MG tablet  Commonly known as:  NORVASC  Take 2.5 mg by mouth every evening.     aspirin 81 MG tablet  Take 81 mg by mouth every other day.     b complex vitamins tablet  Take 1 tablet by mouth every morning.     budesonide-formoterol 160-4.5 MCG/ACT inhaler  Commonly known as:  SYMBICORT  Inhale 2 puffs into the lungs 2 (two) times daily.     calcium carbonate 1250 MG tablet  Commonly known as:  OS-CAL - dosed in mg of elemental calcium  Take 1 tablet by mouth every morning.     cholecalciferol 1000 UNITS tablet  Commonly known as:  VITAMIN D  Take 1,000 Units by mouth daily.     docusate sodium 100 MG capsule  Commonly known as:  COLACE  Take 1 capsule (100 mg total) by mouth 2 (two) times daily.     ferrous sulfate 325 (65 FE) MG tablet  Take 1 tablet (325 mg total) by mouth 3 (three) times daily after meals.     flurbiprofen 100 MG tablet  Commonly known as:  ANSAID  Take 100 mg by mouth daily as needed (pain).     gabapentin 300 MG capsule  Commonly known as:  NEURONTIN  Take 600 mg by mouth daily. Patient takes one 300mg  capsule if pain is  not too bad. If pain continues to get severe she will take another capsule equal up to 600mg      glucosamine-chondroitin 500-400 MG tablet  Take 1 tablet by mouth 3 (three) times a week.     HYDROcodone-acetaminophen 5-325 MG per tablet  Commonly known as:  NORCO/VICODIN  Take 1 tablet by mouth every 6 (six) hours as needed for pain. For pain     levothyroxine 125 MCG tablet  Commonly known as:  SYNTHROID, LEVOTHROID  Take 62.5 mcg by mouth at bedtime.     losartan 50 MG tablet  Commonly known as:  COZAAR  Take 50 mg by mouth every evening.     metoprolol 50 MG tablet  Commonly known as:  LOPRESSOR  Take 50 mg by mouth every evening.     multivitamin with minerals Tabs  Take 1 tablet by mouth every morning.     omalizumab 150 MG injection  Commonly known as:  XOLAIR  Inject 150 mg into the skin every 28 (twenty-eight) days. Administered by Dr. Shelle Iron ([phone]) [phone]     omeprazole 20 MG capsule  Commonly known as:  PRILOSEC  Take 20 mg by mouth every morning.  oxyCODONE-acetaminophen 5-325 MG per tablet  Commonly known as:  PERCOCET/ROXICET  Take one tablet by mouth every 6 hours as needed for pain.     oxyCODONE-acetaminophen 5-325 MG per tablet  Commonly known as:  ROXICET  Take 1 tablet every 4 hours as needed for pain     PROAIR HFA 108 (90 BASE) MCG/ACT inhaler  Generic drug:  albuterol  Inhale 2 puffs into the lungs every 6 (six) hours as needed for wheezing or shortness of breath.     simvastatin 20 MG tablet  Commonly known as:  ZOCOR  Take 20 mg by mouth every evening.     vitamin C 500 MG tablet  Commonly known as:  ASCORBIC ACID  Take 500 mg by mouth daily.        Allergies:  Allergies  Allergen Reactions  . Cefuroxime Axetil Hives  . Lactose Intolerance (Gi) Other (See Comments)    Gi upset  . Prednisone Other (See Comments)    Pt unsure of reaction  . Clarithromycin Palpitations  . Codeine Palpitations  . Sulfonamide Derivatives  Palpitations    Past Medical History  Diagnosis Date  . IBS (irritable bowel syndrome)   . Hypothyroid   . GERD (gastroesophageal reflux disease)   . Hypertension   . Allergic rhinitis   . Asthma   . Complication of anesthesia     confusion after 04/2012    Past Surgical History  Procedure Laterality Date  . Mastectomy      double  . Bladder surgery    . Nasal sinus surgery    . Hip arthroplasty Left 05/04/2012    Procedure: ARTHROPLASTY BIPOLAR HIP;  Surgeon: Shelda Pal, MD;  Location: [facility];  Service: Orthopedics;  Laterality: Left;  . Closed reduction wrist fracture Left 05/04/2012    Procedure: CLOSED REDUCTION WRIST;  Surgeon: Shelda Pal, MD;  Location: [facility];  Service: Orthopedics;  Laterality: Left;  with casting  . Open reduction internal fixation (orif) distal radial fracture Left 05/29/2012    Procedure: OPEN REDUCTION INTERNAL FIXATION (ORIF) DISTAL RADIAL FRACTURE;  Surgeon: Sharma Covert, MD;  Location: [facility] OR;  Service: Orthopedics;  Laterality: Left;    Family History  Problem Relation Age of Onset  . CAD Mother   . Hypertension Father   . Breast cancer Sister     Social History:  reports that she has never smoked. She has never used smokeless tobacco. She reports that she does not drink alcohol or use illicit drugs.  Review of Systems -   Has had COPD followed by pulmonologist  She has a history of hypercholesterolemia and has been taking her simvastatin  Exam:  BP 126/68  Pulse 73  Temp(Src) 97.8 F (36.6 C)  Resp 10  Ht 5' 7.5" (1.715 m)  Wt 131 lb 14.4 oz (59.829 kg)  BMI 20.34 kg/m2  SpO2 97%   Assessment/Plan:   HYPERTENSION: Blood pressure is low normal and she will need to call back with exact dosages of her medications as she is unsure  OSTEOPOROSIS: We'll need followup bone density to decide on future treatment and fracture prevention  Hypothyroidism: TSH to be checked  Addendum: Labs normal except mild decrease in  sodium  Office Visit on 08/08/2012  Component Date Value Range Status  . TSH 08/08/2012 0.81  0.35 - 5.50 uIU/mL Final  . Free T4 08/08/2012 1.06  0.60 - 1.60 ng/dL Final  . Sodium 86/57/8469 132* 135 - 145 mEq/L Final  .  Potassium 08/08/2012 4.6  3.5 - 5.1 mEq/L Final  . Chloride 08/08/2012 99  96 - 112 mEq/L Final  . CO2 08/08/2012 28  19 - 32 mEq/L Final  . Glucose, Bld 08/08/2012 92  70 - 99 mg/dL Final  . BUN 21/30/8657 15  6 - 23 mg/dL Final  . Creatinine, Ser 08/08/2012 0.9  0.4 - 1.2 mg/dL Final  . Total Bilirubin 08/08/2012 0.4  0.3 - 1.2 mg/dL Final  . Alkaline Phosphatase 08/08/2012 52  39 - 117 U/L Final  . AST 08/08/2012 23  0 - 37 U/L Final  . ALT 08/08/2012 19  0 - 35 U/L Final  . Total Protein 08/08/2012 6.3  6.0 - 8.3 g/dL Final  . Albumin 84/69/6295 3.7  3.5 - 5.2 g/dL Final  . Calcium 28/41/3244 8.9  8.4 - 10.5 mg/dL Final  . GFR 01/17/7251 62.92  >60.00 mL/min Final  . Cholesterol 08/08/2012 170  0 - 200 mg/dL Final   ATP III Classification       Desirable:  < 200 mg/dL               Borderline High:  200 - 239 mg/dL          High:  > = 664 mg/dL  . Triglycerides 08/08/2012 88.0  0.0 - 149.0 mg/dL Final   Normal:  <403 mg/dLBorderline High:  150 - 199 mg/dL  . HDL 08/08/2012 66.10  >39.00 mg/dL Final  . VLDL 47/42/5956 17.6  0.0 - 40.0 mg/dL Final  . LDL Cholesterol 08/08/2012 86  0 - 99 mg/dL Final  . Total CHOL/HDL Ratio 08/08/2012 3   Final                  Men          Women1/2 Average Risk     3.4          3.3Average Risk          5.0          4.42X Average Risk          9.6          7.13X Average Risk          15.0          11.0                      . WBC 08/08/2012 8.9  4.5 - 10.5 K/uL Final  . RBC 08/08/2012 4.08  3.87 - 5.11 Mil/uL Final  . Hemoglobin 08/08/2012 12.1  12.0 - 15.0 g/dL Final  . HCT 38/75/6433 36.5  36.0 - 46.0 % Final  . MCV 08/08/2012 89.6  78.0 - 100.0 fl Final  . MCHC 08/08/2012 33.2  30.0 - 36.0 g/dL Final  . RDW 29/51/8841 14.0   11.5 - 14.6 % Final  . Platelets 08/08/2012 306.0  150.0 - 400.0 K/uL Final  . Neutrophils Relative % 08/08/2012 69.6  43.0 - 77.0 % Final  . Lymphocytes Relative 08/08/2012 23.0  12.0 - 46.0 % Final  . Monocytes Relative 08/08/2012 6.2  3.0 - 12.0 % Final  . Eosinophils Relative 08/08/2012 0.8  0.0 - 5.0 % Final  . Basophils Relative 08/08/2012 0.4  0.0 - 3.0 % Final  . Neutro Abs 08/08/2012 6.2  1.4 - 7.7 K/uL Final  . Lymphs Abs 08/08/2012 2.1  0.7 - 4.0 K/uL Final  . Monocytes Absolute 08/08/2012 0.6  0.1 - 1.0 K/uL Final  .  Eosinophils Absolute 08/08/2012 0.1  0.0 - 0.7 K/uL Final  . Basophils Absolute 08/08/2012 0.0  0.0 - 0.1 K/uL Final

## 2012-08-08 NOTE — Patient Instructions (Addendum)
Call to confirm if you are on Amlodipine, need to stop this  Stay on Vitamin D3, 2000 units

## 2012-08-11 ENCOUNTER — Telehealth: Payer: Self-pay | Admitting: Endocrinology

## 2012-08-11 MED ORDER — OMALIZUMAB 150 MG ~~LOC~~ SOLR
150.0000 mg | Freq: Once | SUBCUTANEOUS | Status: AC
  Administered 2012-08-11: 150 mg via SUBCUTANEOUS

## 2012-08-11 NOTE — Telephone Encounter (Signed)
Are lab results avail yet?

## 2012-08-11 NOTE — Telephone Encounter (Signed)
Have you gotten any of her labs back yet?

## 2012-08-11 NOTE — Telephone Encounter (Signed)
Pt wants to know if she is suppose to take Amlodipine and if so what dose? CB [phone]

## 2012-08-11 NOTE — Telephone Encounter (Signed)
To know what doses she has been taking as far as losartan, amlodipine and metoprolol

## 2012-08-11 NOTE — Telephone Encounter (Signed)
All the tests are okay, very slight decrease in sodium but this is not new, needs to avoid excessive fluids

## 2012-08-12 NOTE — Telephone Encounter (Signed)
Pt was notified to stop amlodipine.

## 2012-08-12 NOTE — Telephone Encounter (Signed)
Stop Amlodipine 

## 2012-08-12 NOTE — Telephone Encounter (Signed)
Losartan is 50 mg 1 per day Amlodipine 5 mg, 1/2 tablet per day Metoprolol is 50 Mg 1 tablet per day She feels that one of these is to strong

## 2012-08-13 DIAGNOSIS — R269 Unspecified abnormalities of gait and mobility: Secondary | ICD-10-CM | POA: Diagnosis not present

## 2012-08-13 DIAGNOSIS — I10 Essential (primary) hypertension: Secondary | ICD-10-CM | POA: Diagnosis not present

## 2012-08-13 DIAGNOSIS — Z5189 Encounter for other specified aftercare: Secondary | ICD-10-CM | POA: Diagnosis not present

## 2012-08-13 DIAGNOSIS — Z9181 History of falling: Secondary | ICD-10-CM | POA: Diagnosis not present

## 2012-08-13 DIAGNOSIS — IMO0001 Reserved for inherently not codable concepts without codable children: Secondary | ICD-10-CM | POA: Diagnosis not present

## 2012-08-13 DIAGNOSIS — Z471 Aftercare following joint replacement surgery: Secondary | ICD-10-CM | POA: Diagnosis not present

## 2012-08-15 DIAGNOSIS — Z9181 History of falling: Secondary | ICD-10-CM | POA: Diagnosis not present

## 2012-08-15 DIAGNOSIS — IMO0001 Reserved for inherently not codable concepts without codable children: Secondary | ICD-10-CM | POA: Diagnosis not present

## 2012-08-15 DIAGNOSIS — Z471 Aftercare following joint replacement surgery: Secondary | ICD-10-CM | POA: Diagnosis not present

## 2012-08-15 DIAGNOSIS — I10 Essential (primary) hypertension: Secondary | ICD-10-CM | POA: Diagnosis not present

## 2012-08-15 DIAGNOSIS — R269 Unspecified abnormalities of gait and mobility: Secondary | ICD-10-CM | POA: Diagnosis not present

## 2012-08-15 DIAGNOSIS — Z5189 Encounter for other specified aftercare: Secondary | ICD-10-CM | POA: Diagnosis not present

## 2012-08-18 DIAGNOSIS — S52599A Other fractures of lower end of unspecified radius, initial encounter for closed fracture: Secondary | ICD-10-CM | POA: Diagnosis not present

## 2012-08-19 DIAGNOSIS — IMO0001 Reserved for inherently not codable concepts without codable children: Secondary | ICD-10-CM | POA: Diagnosis not present

## 2012-08-19 DIAGNOSIS — Z9181 History of falling: Secondary | ICD-10-CM | POA: Diagnosis not present

## 2012-08-19 DIAGNOSIS — I10 Essential (primary) hypertension: Secondary | ICD-10-CM | POA: Diagnosis not present

## 2012-08-19 DIAGNOSIS — R269 Unspecified abnormalities of gait and mobility: Secondary | ICD-10-CM | POA: Diagnosis not present

## 2012-08-19 DIAGNOSIS — Z471 Aftercare following joint replacement surgery: Secondary | ICD-10-CM | POA: Diagnosis not present

## 2012-08-19 DIAGNOSIS — Z5189 Encounter for other specified aftercare: Secondary | ICD-10-CM | POA: Diagnosis not present

## 2012-08-23 DIAGNOSIS — R269 Unspecified abnormalities of gait and mobility: Secondary | ICD-10-CM | POA: Diagnosis not present

## 2012-08-23 DIAGNOSIS — Z471 Aftercare following joint replacement surgery: Secondary | ICD-10-CM | POA: Diagnosis not present

## 2012-08-23 DIAGNOSIS — Z5189 Encounter for other specified aftercare: Secondary | ICD-10-CM | POA: Diagnosis not present

## 2012-08-23 DIAGNOSIS — Z9181 History of falling: Secondary | ICD-10-CM | POA: Diagnosis not present

## 2012-08-23 DIAGNOSIS — IMO0001 Reserved for inherently not codable concepts without codable children: Secondary | ICD-10-CM | POA: Diagnosis not present

## 2012-08-23 DIAGNOSIS — I10 Essential (primary) hypertension: Secondary | ICD-10-CM | POA: Diagnosis not present

## 2012-09-08 ENCOUNTER — Ambulatory Visit: Payer: Medicare Other

## 2012-09-09 ENCOUNTER — Ambulatory Visit (INDEPENDENT_AMBULATORY_CARE_PROVIDER_SITE_OTHER): Payer: Medicare Other

## 2012-09-09 DIAGNOSIS — J45909 Unspecified asthma, uncomplicated: Secondary | ICD-10-CM | POA: Diagnosis not present

## 2012-09-11 MED ORDER — OMALIZUMAB 150 MG ~~LOC~~ SOLR
150.0000 mg | Freq: Once | SUBCUTANEOUS | Status: AC
  Administered 2012-09-11: 150 mg via SUBCUTANEOUS

## 2012-09-17 ENCOUNTER — Telehealth: Payer: Self-pay | Admitting: Endocrinology

## 2012-09-17 MED ORDER — FUROSEMIDE 20 MG PO TABS: 20.0000 mg | ORAL_TABLET | Freq: Every day | ORAL | Status: DC

## 2012-09-17 NOTE — Telephone Encounter (Signed)
Please see below and advise.

## 2012-09-17 NOTE — Telephone Encounter (Signed)
Lasix 20mg   Daily, stop amlodipine, OV asap

## 2012-09-17 NOTE — Telephone Encounter (Signed)
Swelling in both legs, left is worse. Started yesterday. Wants to be seen today, no openings. Will need a work in to be seen today. Please call / Sherri S.

## 2012-09-17 NOTE — Telephone Encounter (Signed)
Pt is aware.  

## 2012-09-18 ENCOUNTER — Ambulatory Visit: Payer: Medicare Other | Admitting: Endocrinology

## 2012-09-19 DIAGNOSIS — Z471 Aftercare following joint replacement surgery: Secondary | ICD-10-CM | POA: Diagnosis not present

## 2012-09-29 ENCOUNTER — Telehealth: Payer: Self-pay | Admitting: *Deleted

## 2012-09-29 NOTE — Telephone Encounter (Signed)
Kristen from [employer]? Called about a face to face encounter form that needs your signature.  They are in your inbox. On your desk

## 2012-10-01 ENCOUNTER — Telehealth: Payer: Self-pay | Admitting: Endocrinology

## 2012-10-01 NOTE — Telephone Encounter (Signed)
Please call Kutrice # [phone] w/ home health re: Face to Face acknowledgement letter / Oneita Kras.

## 2012-10-03 ENCOUNTER — Telehealth: Payer: Self-pay | Admitting: *Deleted

## 2012-10-03 MED ORDER — ZOLPIDEM TARTRATE 10 MG PO TABS: ORAL_TABLET | ORAL | Status: DC

## 2012-10-03 NOTE — Telephone Encounter (Signed)
Does she want to try Ambien? We can send 10 mg prescription, half to one tablet at bedtime as needed

## 2012-10-03 NOTE — Telephone Encounter (Signed)
Pt is requesting something for sleep, she said she was unable to sleep last night and she was recently was diagnosed with a lung disease and desperately needs something to help her.

## 2012-10-09 ENCOUNTER — Ambulatory Visit: Payer: Medicare Other

## 2012-10-13 ENCOUNTER — Ambulatory Visit (INDEPENDENT_AMBULATORY_CARE_PROVIDER_SITE_OTHER): Payer: Medicare Other

## 2012-10-13 DIAGNOSIS — J449 Chronic obstructive pulmonary disease, unspecified: Secondary | ICD-10-CM | POA: Diagnosis not present

## 2012-10-13 MED ORDER — OMALIZUMAB 150 MG ~~LOC~~ SOLR
150.0000 mg | Freq: Once | SUBCUTANEOUS | Status: AC
  Administered 2012-10-13: 150 mg via SUBCUTANEOUS

## 2012-10-21 ENCOUNTER — Ambulatory Visit (INDEPENDENT_AMBULATORY_CARE_PROVIDER_SITE_OTHER): Payer: Medicare Other | Admitting: Pulmonary Disease

## 2012-10-21 ENCOUNTER — Encounter: Payer: Self-pay | Admitting: Pulmonary Disease

## 2012-10-21 VITALS — BP 122/68 | HR 74 | Temp 97.1°F | Ht 67.0 in | Wt 137.6 lb

## 2012-10-21 DIAGNOSIS — J479 Bronchiectasis, uncomplicated: Secondary | ICD-10-CM | POA: Diagnosis not present

## 2012-10-21 DIAGNOSIS — J449 Chronic obstructive pulmonary disease, unspecified: Secondary | ICD-10-CM

## 2012-10-21 DIAGNOSIS — Z23 Encounter for immunization: Secondary | ICD-10-CM

## 2012-10-21 NOTE — Assessment & Plan Note (Signed)
The patient is doing well from that standpoint, and has not had a pulmonary infection since last visit.

## 2012-10-21 NOTE — Patient Instructions (Addendum)
No change in meds Continue with xolair injections Continue with flutter valve to help with secretion clearance. Will give you the flu shot today.  followup with me in 4mos .

## 2012-10-21 NOTE — Assessment & Plan Note (Signed)
The patient has not had an acute exacerbation since last visit, and feels that her dyspnea on exertion is at baseline.  I have asked her to continue on her current bronchodilator regimen, as well as her Xolair injections.

## 2012-10-21 NOTE — Progress Notes (Signed)
  Subjective:    Patient ID: Joanna [name], female    DOB: July 30, 1928, 79 y.o.   MRN: [id number]  HPI The patient comes in today for followup of her known chronic obstructive asthma and bronchiectasis.  She is maintaining on her bronchodilator regimen, as well as her Xolair injections.  She has done very well since the last visit, with no acute exacerbation or pulmonary infection.  She continues to have chronic dyspnea on exertion, but is at her usual baseline.   Review of Systems  Constitutional: Negative for fever and unexpected weight change.  HENT: Negative for ear pain, nosebleeds, congestion, sore throat, rhinorrhea, sneezing, trouble swallowing, dental problem, postnasal drip and sinus pressure.   Eyes: Negative for redness and itching.  Respiratory: Negative for cough, chest tightness, shortness of breath and wheezing.   Cardiovascular: Negative for palpitations and leg swelling.  Gastrointestinal: Negative for nausea and vomiting.  Genitourinary: Negative for dysuria.  Musculoskeletal: Negative for joint swelling.  Skin: Negative for rash.  Neurological: Negative for headaches.  Hematological: Does not bruise/bleed easily.  Psychiatric/Behavioral: Negative for dysphoric mood. The patient is not nervous/anxious.        Objective:   Physical Exam Thin female in no acute distress Nose without purulence or discharge Neck without lymphadenopathy or thyromegaly Chest with mildly decreased breath sounds, no wheezes or crackles Cardiac exam with regular rate and rhythm Lower extremities without edema, no cyanosis Alert and oriented, moves all 4 extremities.       Assessment & Plan:

## 2012-10-29 ENCOUNTER — Telehealth: Payer: Self-pay | Admitting: Pulmonary Disease

## 2012-10-29 NOTE — Telephone Encounter (Signed)
Pt needs ov to make sure not a severe reaction

## 2012-10-29 NOTE — Telephone Encounter (Signed)
I spoke with pt. She stated she received a flu shot 10/21/12. Her left shoulder has been hurting and into the arm. She has been keeping it active. She took aleve but didn't help the pain. She has hydrocodone 5-325 mg and took about 2 and it helped but the pain came back. Her arm feels extremely sore. The site of the flu shot feels slightly warm. She rates pain 10/10 on pain scale. Pt is requesting further recs. Please advise KC thanks  Allergies  Allergen Reactions  . Cefuroxime Axetil Hives  . Lactose Intolerance (Gi) Other (See Comments)    Gi upset  . Prednisone Other (See Comments)    Pt unsure of reaction  . Clarithromycin Palpitations  . Codeine Palpitations  . Sulfonamide Derivatives Palpitations

## 2012-10-29 NOTE — Telephone Encounter (Signed)
OV with MW tomorrow am at 11:45

## 2012-10-29 NOTE — Telephone Encounter (Signed)
ATC PT LINE BUSY X 3 WCB 

## 2012-10-30 ENCOUNTER — Encounter: Payer: Self-pay | Admitting: Pulmonary Disease

## 2012-10-30 ENCOUNTER — Ambulatory Visit (INDEPENDENT_AMBULATORY_CARE_PROVIDER_SITE_OTHER): Payer: Medicare Other | Admitting: Pulmonary Disease

## 2012-10-30 VITALS — BP 124/70 | HR 59 | Temp 97.6°F | Ht 67.0 in | Wt 138.6 lb

## 2012-10-30 DIAGNOSIS — M25522 Pain in left elbow: Secondary | ICD-10-CM

## 2012-10-30 DIAGNOSIS — M25529 Pain in unspecified elbow: Secondary | ICD-10-CM | POA: Insufficient documentation

## 2012-10-30 NOTE — Assessment & Plan Note (Signed)
The patient had significant pain in her left upper arm and joints, along with erythema, after a flu shot.  She has had no signs of anaphylaxis, and no systemic symptoms.  She has had no skin breakdown, and currently there is no erythema or warmth.  Her injection site is no longer visible.  I do not see any evidence for necrosis, and I've asked her to use ibuprofen as needed for discomfort

## 2012-10-30 NOTE — Progress Notes (Signed)
  Subjective:    Patient ID: Joanna [name], female    DOB: [birth year], 79 y.o.   MRN: [id number]  HPI The patient comes in today for left arm pain after a flu shot injection.  She developed redness at the site, swelling of her arm, and extreme soreness that extended up into her shoulder and elbow.  She never had any skin breakdown, and had no fevers or chills.  She states that things have greatly improved, but she continues to have on soreness.   Review of Systems  Constitutional: Negative for fever and unexpected weight change.  HENT: Negative for congestion, dental problem, ear pain, nosebleeds, postnasal drip, rhinorrhea, sinus pressure, sneezing, sore throat and trouble swallowing.   Eyes: Negative for redness and itching.  Respiratory: Negative for cough, chest tightness, shortness of breath and wheezing.   Cardiovascular: Negative for palpitations and leg swelling.  Gastrointestinal: Negative for nausea and vomiting.  Genitourinary: Negative for dysuria.  Musculoskeletal: Negative for joint swelling.       Arm pain at injection site of Flu vaccine  Skin: Negative for rash.  Neurological: Negative for headaches.  Hematological: Does not bruise/bleed easily.  Psychiatric/Behavioral: Negative for dysphoric mood. The patient is not nervous/anxious.        Objective:   Physical Exam Well-developed female in no acute distress Left arm with no swelling, redness, or evidence for skin breakdown.  Her injection site is not even visible.  Good range of motion of both shoulder and elbow joints, but patient does complain of some mild tenderness with passive range of movement.       Assessment & Plan:

## 2012-10-30 NOTE — Patient Instructions (Signed)
Your arm looks ok currently.  Will see how things go from this point forward.

## 2012-10-31 ENCOUNTER — Telehealth: Payer: Self-pay | Admitting: Pulmonary Disease

## 2012-10-31 NOTE — Telephone Encounter (Signed)
Called and spoke with  Pt and she stated that she had a missed call from out office.  i explained that i did not see anything in the computer where someone called her.  She stated that she arm is still sore but seems to be getting better.  She is to call back for any other problems.

## 2012-11-05 ENCOUNTER — Ambulatory Visit (INDEPENDENT_AMBULATORY_CARE_PROVIDER_SITE_OTHER): Payer: Medicare Other | Admitting: Endocrinology

## 2012-11-05 ENCOUNTER — Other Ambulatory Visit (INDEPENDENT_AMBULATORY_CARE_PROVIDER_SITE_OTHER): Payer: Medicare Other

## 2012-11-05 ENCOUNTER — Encounter: Payer: Self-pay | Admitting: Endocrinology

## 2012-11-05 VITALS — BP 156/82 | HR 72 | Temp 98.2°F | Resp 12 | Ht 67.0 in | Wt 141.3 lb

## 2012-11-05 DIAGNOSIS — E7800 Pure hypercholesterolemia, unspecified: Secondary | ICD-10-CM

## 2012-11-05 DIAGNOSIS — M25529 Pain in unspecified elbow: Secondary | ICD-10-CM

## 2012-11-05 DIAGNOSIS — E039 Hypothyroidism, unspecified: Secondary | ICD-10-CM | POA: Diagnosis not present

## 2012-11-05 DIAGNOSIS — I10 Essential (primary) hypertension: Secondary | ICD-10-CM

## 2012-11-05 DIAGNOSIS — M25522 Pain in left elbow: Secondary | ICD-10-CM

## 2012-11-05 DIAGNOSIS — M810 Age-related osteoporosis without current pathological fracture: Secondary | ICD-10-CM | POA: Diagnosis not present

## 2012-11-05 LAB — LIPID PANEL
HDL: 70.6 mg/dL (ref >39.00)
Triglycerides: 217 mg/dL — ABNORMAL HIGH (ref 0.0–149.0)

## 2012-11-05 LAB — BASIC METABOLIC PANEL
BUN: 22 mg/dL (ref 6–23)
CO2: 29 mEq/L (ref 19–32)
Calcium: 9 mg/dL (ref 8.4–10.5)
Creatinine, Ser: 1.3 mg/dL — ABNORMAL HIGH (ref 0.4–1.2)
Glucose, Bld: 102 mg/dL — ABNORMAL HIGH (ref 70–99)

## 2012-11-05 LAB — CBC WITH DIFFERENTIAL/PLATELET
Basophils Absolute: 0 10*3/uL (ref 0.0–0.1)
Eosinophils Absolute: 0.1 10*3/uL (ref 0.0–0.7)
HCT: 35.2 % — ABNORMAL LOW (ref 36.0–46.0)
Hemoglobin: 11.9 g/dL — ABNORMAL LOW (ref 12.0–15.0)
Lymphocytes Relative: 22.8 % (ref 12.0–46.0)
Lymphs Abs: 2 10*3/uL (ref 0.7–4.0)
MCHC: 33.9 g/dL (ref 30.0–36.0)
Monocytes Absolute: 0.6 10*3/uL (ref 0.1–1.0)
Neutro Abs: 6 10*3/uL (ref 1.4–7.7)
RDW: 13.9 % (ref 11.5–14.6)

## 2012-11-05 LAB — COMPREHENSIVE METABOLIC PANEL
AST: 31 U/L (ref 0–37)
BUN: 21 mg/dL (ref 6–23)
Calcium: 9.1 mg/dL (ref 8.4–10.5)
Chloride: 103 mEq/L (ref 96–112)
Creatinine, Ser: 1.3 mg/dL — ABNORMAL HIGH (ref 0.4–1.2)

## 2012-11-05 LAB — TSH: TSH: 0.54 u[IU]/mL (ref 0.35–5.50)

## 2012-11-05 NOTE — Progress Notes (Signed)
Patient ID: Joanna [name], female   DOB: May 09, 1928, 79 y.o.   MRN: [id number]  Chief complaint: Followup  History of Present Illness:  Joanna [name] is here for followup of multiple issues:  1. HYPERTENSION: Joanna [name] has had mild to moderate hypertension with usually good control and requiring small doses of medications. Joanna [name] is again somewhat unclear about what medications Joanna [name] is taking, previously on on amlodipine, losartan and metoprolol   2. Joanna [name] has had a previous history of osteoporosis and has not had a bone density as planned. Joanna [name] has been quite noncompliant with taking her Fosamax and is concerned about possible side effects  3. Has history of hypothyroidism, mild and TSH has been consistently normal on 62.5 mcg daily. No unusual fatigue or weight change  4. History of idiopathic peripheral neuropathy, usually has control of symptoms with Neurontin    Medication List       This list is accurate as of: 11/05/12 11:59 PM.  Always use your most recent med list.               aspirin 81 MG tablet  Take 81 mg by mouth every other day.     b complex vitamins tablet  Take 1 tablet by mouth every morning.     budesonide-formoterol 160-4.5 MCG/ACT inhaler  Commonly known as:  SYMBICORT  Inhale 2 puffs into the lungs 2 (two) times daily.     calcium carbonate 1250 MG tablet  Commonly known as:  OS-CAL - dosed in mg of elemental calcium  Take 1 tablet by mouth every morning.     cholecalciferol 1000 UNITS tablet  Commonly known as:  VITAMIN D  Take 1,000 Units by mouth daily.     docusate sodium 100 MG capsule  Commonly known as:  COLACE  Take 1 capsule (100 mg total) by mouth 2 (two) times daily.     ferrous sulfate 325 (65 FE) MG tablet  Take 1 tablet (325 mg total) by mouth 3 (three) times daily after meals.     fish oil-omega-3 fatty acids 1000 MG capsule  Take 2 g by mouth daily.     flurbiprofen 100 MG tablet  Commonly known as:  ANSAID  Take 100 mg by mouth daily as needed  (pain).     furosemide 20 MG tablet  Commonly known as:  LASIX  Take 1 tablet (20 mg total) by mouth daily.     gabapentin 300 MG capsule  Commonly known as:  NEURONTIN  Take 600 mg by mouth daily. Patient takes one 300mg  capsule if pain is not too bad. If pain continues to get severe Joanna [name] will take another capsule equal up to 600mg      glucosamine-chondroitin 500-400 MG tablet  Take 1 tablet by mouth 3 (three) times a week.     levothyroxine 125 MCG tablet  Commonly known as:  SYNTHROID, LEVOTHROID  Take 62.5 mcg by mouth at bedtime.     losartan 50 MG tablet  Commonly known as:  COZAAR  Take 50 mg by mouth every evening.     metoprolol 50 MG tablet  Commonly known as:  LOPRESSOR  Take 50 mg by mouth every evening.     multivitamin with minerals Tabs tablet  Take 1 tablet by mouth every morning.     omalizumab 150 MG injection  Commonly known as:  XOLAIR  Inject 150 mg into the skin every 28 (twenty-eight) days. Administered by Dr. Shelle Iron [phone]     omeprazole  20 MG capsule  Commonly known as:  PRILOSEC  Take 20 mg by mouth every morning.     oxyCODONE-acetaminophen 5-325 MG per tablet  Commonly known as:  PERCOCET/ROXICET  Take one tablet by mouth every 6 hours as needed for pain.     POTASSIUM GLUCONATE PO  Take by mouth. Unsure of dose     PROAIR HFA 108 (90 BASE) MCG/ACT inhaler  Generic drug:  albuterol  Inhale 2 puffs into the lungs every 6 (six) hours as needed for wheezing or shortness of breath.     simvastatin 20 MG tablet  Commonly known as:  ZOCOR  Take 20 mg by mouth every evening.     vitamin C 500 MG tablet  Commonly known as:  ASCORBIC ACID  Take 500 mg by mouth daily.     zolpidem 10 MG tablet  Commonly known as:  AMBIEN  Take 1/2 to 1 tablet at bedtime as needed for sleep        Allergies:  Allergies  Allergen Reactions  . Cefuroxime Axetil Hives  . Lactose Intolerance (Gi) Other (See Comments)    Gi upset  . Prednisone  Other (See Comments)    Pt unsure of reaction  . Clarithromycin Palpitations  . Codeine Palpitations  . Sulfonamide Derivatives Palpitations    Past Medical History  Diagnosis Date  . IBS (irritable bowel syndrome)   . Hypothyroid   . GERD (gastroesophageal reflux disease)   . Hypertension   . Allergic rhinitis   . Asthma   . Complication of anesthesia     confusion after 04/2012    Past Surgical History  Procedure Laterality Date  . Mastectomy      double  . Bladder surgery    . Nasal sinus surgery    . Hip arthroplasty Left 05/04/2012    Procedure: ARTHROPLASTY BIPOLAR HIP;  Surgeon: Shelda Pal, MD;  Location: [facility];  Service: Orthopedics;  Laterality: Left;  . Closed reduction wrist fracture Left 05/04/2012    Procedure: CLOSED REDUCTION WRIST;  Surgeon: Shelda Pal, MD;  Location: [facility];  Service: Orthopedics;  Laterality: Left;  with casting  . Open reduction internal fixation (orif) distal radial fracture Left 05/29/2012    Procedure: OPEN REDUCTION INTERNAL FIXATION (ORIF) DISTAL RADIAL FRACTURE;  Surgeon: Sharma Covert, MD;  Location: [facility];  Service: Orthopedics;  Laterality: Left;    Family History  Problem Relation Age of Onset  . CAD Mother   . Hypertension Father   . Breast cancer Sister     Social History:  reports that Joanna [name] has never smoked. Joanna [name] has never used smokeless tobacco. Joanna [name] reports that Joanna [name] does not drink alcohol or use illicit drugs.  Review of Systems -   Has had COPD followed by pulmonologist  Joanna [name] has a history of hypercholesterolemia and has been taking her simvastatin regularly  Exam:  BP 156/82  Pulse 72  Temp(Src) 98.2 F (36.8 C)  Resp 12  Ht 5\' 7"  (1.702 m)  Wt 141 lb 4.8 oz (64.093 kg)  BMI 22.13 kg/m2  SpO2 99%   Assessment/Plan:   HYPERTENSION: Blood pressure was unusually high on the first measurement. Advised her to have it checked periodically at the drug store also  OSTEOPOROSIS: We'll need followup bone  density to decide on future treatment and fracture prevention. Given her literature on Reclast infusion and would consider this if bone density is significantly low. Joanna [name] has had a history of multiple fractures this  year  Hypothyroidism: TSH to be checked    Addendum: Labs show high normal creatinine otherwise normal, Joanna [name] will need to avoid nonsteroidal drugs and also Lasix can be stopped  Appointment on 11/05/2012  Component Date Value Range Status  . Sodium 11/05/2012 139  135 - 145 mEq/L Final  . Potassium 11/05/2012 4.6  3.5 - 5.1 mEq/L Final  . Chloride 11/05/2012 103  96 - 112 mEq/L Final  . CO2 11/05/2012 29  19 - 32 mEq/L Final  . Glucose, Bld 11/05/2012 102* 70 - 99 mg/dL Final  . BUN 16/10/9602 22  6 - 23 mg/dL Final  . Creatinine, Ser 11/05/2012 1.3* 0.4 - 1.2 mg/dL Final  . Calcium 54/09/8117 9.0  8.4 - 10.5 mg/dL Final  . GFR 14/78/2956 41.30* >60.00 mL/min Final  Office Visit on 11/05/2012  Component Date Value Range Status  . Sodium 11/05/2012 139  135 - 145 mEq/L Final  . Potassium 11/05/2012 4.6  3.5 - 5.1 mEq/L Final  . Chloride 11/05/2012 103  96 - 112 mEq/L Final  . CO2 11/05/2012 29  19 - 32 mEq/L Final  . Glucose, Bld 11/05/2012 101* 70 - 99 mg/dL Final  . BUN 21/30/8657 21  6 - 23 mg/dL Final  . Creatinine, Ser 11/05/2012 1.3* 0.4 - 1.2 mg/dL Final  . Total Bilirubin 11/05/2012 0.3  0.3 - 1.2 mg/dL Final  . Alkaline Phosphatase 11/05/2012 49  39 - 117 U/L Final  . AST 11/05/2012 31  0 - 37 U/L Final  . ALT 11/05/2012 17  0 - 35 U/L Final  . Total Protein 11/05/2012 6.5  6.0 - 8.3 g/dL Final  . Albumin 84/69/6295 4.0  3.5 - 5.2 g/dL Final  . Calcium 28/41/3244 9.1  8.4 - 10.5 mg/dL Final  . GFR 01/17/7251 42.80* >60.00 mL/min Final  . Cholesterol 11/05/2012 178  0 - 200 mg/dL Final   ATP III Classification       Desirable:  < 200 mg/dL               Borderline High:  200 - 239 mg/dL          High:  > = 664 mg/dL  . Triglycerides 11/05/2012 217.0* 0.0 - 149.0  mg/dL Final   Normal:  <403 mg/dLBorderline High:  150 - 199 mg/dL  . HDL 11/05/2012 70.60  >39.00 mg/dL Final  . VLDL 47/42/5956 43.4* 0.0 - 40.0 mg/dL Final  . Total CHOL/HDL Ratio 11/05/2012 3   Final                  Men          Women1/2 Average Risk     3.4          3.3Average Risk          5.0          4.42X Average Risk          9.6          7.13X Average Risk          15.0          11.0                      . TSH 11/05/2012 0.54  0.35 - 5.50 uIU/mL Final  . Free T4 11/05/2012 1.48  0.60 - 1.60 ng/dL Final  . WBC 38/75/6433 8.8  4.5 - 10.5 K/uL Final  . RBC 11/05/2012 4.01  3.87 -  5.11 Mil/uL Final  . Hemoglobin 11/05/2012 11.9* 12.0 - 15.0 g/dL Final  . HCT 16/10/9602 35.2* 36.0 - 46.0 % Final  . MCV 11/05/2012 87.7  78.0 - 100.0 fl Final  . MCHC 11/05/2012 33.9  30.0 - 36.0 g/dL Final  . RDW 54/09/8117 13.9  11.5 - 14.6 % Final  . Platelets 11/05/2012 249.0  150.0 - 400.0 K/uL Final  . Neutrophils Relative % 11/05/2012 68.7  43.0 - 77.0 % Final  . Lymphocytes Relative 11/05/2012 22.8  12.0 - 46.0 % Final  . Monocytes Relative 11/05/2012 6.9  3.0 - 12.0 % Final  . Eosinophils Relative 11/05/2012 1.1  0.0 - 5.0 % Final  . Basophils Relative 11/05/2012 0.5  0.0 - 3.0 % Final  . Neutro Abs 11/05/2012 6.0  1.4 - 7.7 K/uL Final  . Lymphs Abs 11/05/2012 2.0  0.7 - 4.0 K/uL Final  . Monocytes Absolute 11/05/2012 0.6  0.1 - 1.0 K/uL Final  . Eosinophils Absolute 11/05/2012 0.1  0.0 - 0.7 K/uL Final  . Basophils Absolute 11/05/2012 0.0  0.0 - 0.1 K/uL Final  . Direct LDL 11/05/2012 93.8   Final   Optimal:  <100 mg/dLNear or Above Optimal:  100-129 mg/dLBorderline High:  130-159 mg/dLHigh:  160-189 mg/dLVery High:  >190 mg/dL

## 2012-11-06 ENCOUNTER — Telehealth: Payer: Self-pay | Admitting: Endocrinology

## 2012-11-06 NOTE — Telephone Encounter (Signed)
Pt calling back missed return call earlier / [employer]

## 2012-11-07 NOTE — Telephone Encounter (Signed)
LMOVM

## 2012-11-10 ENCOUNTER — Other Ambulatory Visit (INDEPENDENT_AMBULATORY_CARE_PROVIDER_SITE_OTHER): Payer: Medicare Other

## 2012-11-10 ENCOUNTER — Inpatient Hospital Stay: Admission: RE | Admit: 2012-11-10 | Payer: Medicare Other | Source: Ambulatory Visit

## 2012-11-10 DIAGNOSIS — I10 Essential (primary) hypertension: Secondary | ICD-10-CM | POA: Diagnosis not present

## 2012-11-10 LAB — BASIC METABOLIC PANEL
BUN: 17 mg/dL (ref 6–23)
Calcium: 9.5 mg/dL (ref 8.4–10.5)
Creatinine, Ser: 0.9 mg/dL (ref 0.4–1.2)
GFR: 65.36 mL/min (ref >60.00)

## 2012-11-12 ENCOUNTER — Telehealth: Payer: Self-pay | Admitting: Endocrinology

## 2012-11-12 ENCOUNTER — Other Ambulatory Visit: Payer: Self-pay | Admitting: Endocrinology

## 2012-11-13 ENCOUNTER — Other Ambulatory Visit: Payer: Self-pay | Admitting: *Deleted

## 2012-11-13 ENCOUNTER — Ambulatory Visit: Payer: Medicare Other

## 2012-11-14 ENCOUNTER — Ambulatory Visit (INDEPENDENT_AMBULATORY_CARE_PROVIDER_SITE_OTHER): Payer: Medicare Other

## 2012-11-14 DIAGNOSIS — J45909 Unspecified asthma, uncomplicated: Secondary | ICD-10-CM

## 2012-11-17 DIAGNOSIS — M25559 Pain in unspecified hip: Secondary | ICD-10-CM | POA: Diagnosis not present

## 2012-11-19 DIAGNOSIS — M25559 Pain in unspecified hip: Secondary | ICD-10-CM | POA: Diagnosis not present

## 2012-11-19 MED ORDER — OMALIZUMAB 150 MG ~~LOC~~ SOLR
150.0000 mg | Freq: Once | SUBCUTANEOUS | Status: AC
  Administered 2012-11-19: 150 mg via SUBCUTANEOUS

## 2012-11-24 ENCOUNTER — Telehealth: Payer: Self-pay | Admitting: Endocrinology

## 2012-11-24 ENCOUNTER — Other Ambulatory Visit: Payer: Self-pay | Admitting: *Deleted

## 2012-11-24 MED ORDER — METOPROLOL TARTRATE 50 MG PO TABS: 50.0000 mg | ORAL_TABLET | Freq: Every evening | ORAL | Status: DC

## 2012-11-24 MED ORDER — LOSARTAN POTASSIUM 50 MG PO TABS: 50.0000 mg | ORAL_TABLET | Freq: Every evening | ORAL | Status: DC

## 2012-11-24 MED ORDER — NORTRIPTYLINE HCL 25 MG PO CAPS: 25.0000 mg | ORAL_CAPSULE | Freq: Every day | ORAL | Status: DC

## 2012-11-24 MED ORDER — SIMVASTATIN 20 MG PO TABS: 20.0000 mg | ORAL_TABLET | Freq: Every evening | ORAL | Status: DC

## 2012-11-24 MED ORDER — GABAPENTIN 300 MG PO CAPS: 600.0000 mg | ORAL_CAPSULE | Freq: Every day | ORAL | Status: DC

## 2012-11-24 MED ORDER — LEVOTHYROXINE SODIUM 125 MCG PO TABS: 62.5000 ug | ORAL_TABLET | Freq: Every day | ORAL | Status: DC

## 2012-11-24 NOTE — Telephone Encounter (Signed)
rxs sent

## 2012-11-26 DIAGNOSIS — M25559 Pain in unspecified hip: Secondary | ICD-10-CM | POA: Diagnosis not present

## 2012-11-28 DIAGNOSIS — M25559 Pain in unspecified hip: Secondary | ICD-10-CM | POA: Diagnosis not present

## 2012-12-03 DIAGNOSIS — M25559 Pain in unspecified hip: Secondary | ICD-10-CM | POA: Diagnosis not present

## 2012-12-04 ENCOUNTER — Emergency Department (HOSPITAL_COMMUNITY)
Admission: EM | Admit: 2012-12-04 | Discharge: 2012-12-04 | Disposition: A | Discharge status: Home / Self Care | Payer: Medicare Other | Attending: Emergency Medicine | Admitting: Emergency Medicine

## 2012-12-04 ENCOUNTER — Encounter (HOSPITAL_COMMUNITY): Payer: Self-pay | Admitting: Emergency Medicine

## 2012-12-04 ENCOUNTER — Emergency Department (HOSPITAL_COMMUNITY): Payer: Medicare Other

## 2012-12-04 DIAGNOSIS — E039 Hypothyroidism, unspecified: Secondary | ICD-10-CM | POA: Diagnosis not present

## 2012-12-04 DIAGNOSIS — IMO0002 Reserved for concepts with insufficient information to code with codable children: Secondary | ICD-10-CM | POA: Insufficient documentation

## 2012-12-04 DIAGNOSIS — Z79899 Other long term (current) drug therapy: Secondary | ICD-10-CM | POA: Insufficient documentation

## 2012-12-04 DIAGNOSIS — R509 Fever, unspecified: Secondary | ICD-10-CM | POA: Insufficient documentation

## 2012-12-04 DIAGNOSIS — I10 Essential (primary) hypertension: Secondary | ICD-10-CM | POA: Insufficient documentation

## 2012-12-04 DIAGNOSIS — K219 Gastro-esophageal reflux disease without esophagitis: Secondary | ICD-10-CM | POA: Diagnosis not present

## 2012-12-04 DIAGNOSIS — G8929 Other chronic pain: Secondary | ICD-10-CM | POA: Diagnosis not present

## 2012-12-04 DIAGNOSIS — M25559 Pain in unspecified hip: Secondary | ICD-10-CM | POA: Insufficient documentation

## 2012-12-04 DIAGNOSIS — M169 Osteoarthritis of hip, unspecified: Secondary | ICD-10-CM | POA: Diagnosis not present

## 2012-12-04 DIAGNOSIS — K589 Irritable bowel syndrome without diarrhea: Secondary | ICD-10-CM | POA: Insufficient documentation

## 2012-12-04 DIAGNOSIS — Z9889 Other specified postprocedural states: Secondary | ICD-10-CM | POA: Diagnosis not present

## 2012-12-04 DIAGNOSIS — Z7982 Long term (current) use of aspirin: Secondary | ICD-10-CM | POA: Diagnosis not present

## 2012-12-04 DIAGNOSIS — J45909 Unspecified asthma, uncomplicated: Secondary | ICD-10-CM | POA: Diagnosis not present

## 2012-12-04 MED ORDER — HYDROCODONE-ACETAMINOPHEN 5-325 MG PO TABS: 1.0000 | ORAL_TABLET | Freq: Four times a day (QID) | ORAL | Status: DC | PRN

## 2012-12-04 MED ORDER — HYDROCODONE-ACETAMINOPHEN 5-325 MG PO TABS
1.0000 | ORAL_TABLET | Freq: Once | ORAL | Status: AC
  Administered 2012-12-04: 1 via ORAL
  Filled 2012-12-04: qty 1

## 2012-12-04 NOTE — ED Provider Notes (Signed)
CSN: [id number]     Arrival date & time 12/04/12  1817 History   First MD Initiated Contact with Patient 12/04/12 1859     Chief Complaint  Patient presents with  . Hip Pain   (Consider location/radiation/quality/duration/timing/severity/associated sxs/prior Treatment) HPI  This is an 79 year old female with a history of left hip replacement in April who presents with left hip pain. Patient reports progressive worsening of left hip pain in the last 2-3 days. She states she has never been without pain since her surgery in April. Pain is worse when she is on her feet all day. The last 2-3 days she's noted sharp pain that radiates from her hip to her knee. She denies any weakness, numbness, or tingling.  She denies any new injury.  Past Medical History  Diagnosis Date  . IBS (irritable bowel syndrome)   . Hypothyroid   . GERD (gastroesophageal reflux disease)   . Hypertension   . Allergic rhinitis   . Asthma   . Complication of anesthesia     confusion after 04/2012   Past Surgical History  Procedure Laterality Date  . Mastectomy      double  . Bladder surgery    . Nasal sinus surgery    . Hip arthroplasty Left 05/04/2012    Procedure: ARTHROPLASTY BIPOLAR HIP;  Surgeon: Shelda Pal, MD;  Location: [facility];  Service: Orthopedics;  Laterality: Left;  . Closed reduction wrist fracture Left 05/04/2012    Procedure: CLOSED REDUCTION WRIST;  Surgeon: Shelda Pal, MD;  Location: [facility];  Service: Orthopedics;  Laterality: Left;  with casting  . Open reduction internal fixation (orif) distal radial fracture Left 05/29/2012    Procedure: OPEN REDUCTION INTERNAL FIXATION (ORIF) DISTAL RADIAL FRACTURE;  Surgeon: Sharma Covert, MD;  Location: [facility];  Service: Orthopedics;  Laterality: Left;   Family History  Problem Relation Age of Onset  . CAD Mother   . Hypertension Father   . Breast cancer Sister    History  Substance Use Topics  . Smoking status: Never Smoker   . Smokeless  tobacco: Never Used  . Alcohol Use: No   OB History   Grav Para Term Preterm Abortions TAB SAB Ect Mult Living                 Review of Systems  Constitutional: Positive for fever.  Respiratory: Negative for cough, chest tightness and shortness of breath.   Cardiovascular: Negative for chest pain.  Gastrointestinal: Negative for nausea, vomiting and abdominal pain.  Genitourinary: Negative for dysuria.  Musculoskeletal:       Left hip pain  Neurological: Negative for headaches.  Psychiatric/Behavioral: Negative for confusion.  All other systems reviewed and are negative.    Allergies  Cefuroxime axetil; Lactose intolerance (gi); Prednisone; Clarithromycin; Codeine; and Sulfonamide derivatives  Home Medications   Current Outpatient Rx  Name  Route  Sig  Dispense  Refill  . albuterol (PROAIR HFA) 108 (90 BASE) MCG/ACT inhaler   Inhalation   Inhale 2 puffs into the lungs every 6 (six) hours as needed for wheezing or shortness of breath.          Marland Kitchen aspirin 81 MG tablet   Oral   Take 81 mg by mouth every other day.          . b complex vitamins tablet   Oral   Take 1 tablet by mouth every morning.         . budesonide-formoterol (SYMBICORT)  160-4.5 MCG/ACT inhaler   Inhalation   Inhale 2 puffs into the lungs 2 (two) times daily.           . calcium carbonate (OS-CAL - DOSED IN MG OF ELEMENTAL CALCIUM) 1250 MG tablet   Oral   Take 1 tablet by mouth every morning.         . cholecalciferol (VITAMIN D) 1000 UNITS tablet   Oral   Take 1,000 Units by mouth daily.         Marland Kitchen docusate sodium (COLACE) 100 MG capsule   Oral   Take 1 capsule (100 mg total) by mouth 2 (two) times daily.   30 capsule   0   . ferrous sulfate 325 (65 FE) MG tablet   Oral   Take 1 tablet (325 mg total) by mouth 3 (three) times daily after meals.   30 tablet   0   . fish oil-omega-3 fatty acids 1000 MG capsule   Oral   Take 2 g by mouth daily.         . flurbiprofen  (ANSAID) 100 MG tablet   Oral   Take 100 mg by mouth daily as needed (pain).          . furosemide (LASIX) 20 MG tablet   Oral   Take 20 mg by mouth daily.         Marland Kitchen gabapentin (NEURONTIN) 300 MG capsule   Oral   Take 2 capsules (600 mg total) by mouth daily.   180 capsule   1   . glucosamine-chondroitin 500-400 MG tablet   Oral   Take 1 tablet by mouth 3 (three) times a week.         . levothyroxine (SYNTHROID, LEVOTHROID) 125 MCG tablet   Oral   Take 0.5 tablets (62.5 mcg total) by mouth at bedtime.   45 tablet   1   . losartan (COZAAR) 50 MG tablet   Oral   Take 1 tablet (50 mg total) by mouth every evening.   90 tablet   1   . metoprolol (LOPRESSOR) 50 MG tablet   Oral   Take 1 tablet (50 mg total) by mouth every evening.   90 tablet   1   . Multiple Vitamin (MULTIVITAMIN WITH MINERALS) TABS   Oral   Take 1 tablet by mouth every morning.         . nortriptyline (PAMELOR) 25 MG capsule   Oral   Take 1 capsule (25 mg total) by mouth at bedtime.   90 capsule   1   . omalizumab (XOLAIR) 150 MG injection   Subcutaneous   Inject 150 mg into the skin every 28 (twenty-eight) days. Administered by Dr. Shelle Iron [phone]         . omeprazole (PRILOSEC) 20 MG capsule   Oral   Take 20 mg by mouth every morning.         Marland Kitchen oxazepam (SERAX) 10 MG capsule      TAKE ONE CAPSULE BY MOUTH EVERY NIGHT AT BEDTIME   30 capsule   1   . oxyCODONE-acetaminophen (PERCOCET/ROXICET) 5-325 MG per tablet      Take one tablet by mouth every 6 hours as needed for pain.   120 tablet   0   . POTASSIUM GLUCONATE PO   Oral   Take by mouth. Unsure of dose         . simvastatin (ZOCOR) 20 MG tablet   Oral  Take 1 tablet (20 mg total) by mouth every evening.   90 tablet   1   . vitamin C (ASCORBIC ACID) 500 MG tablet   Oral   Take 500 mg by mouth daily.         Marland Kitchen zolpidem (AMBIEN) 10 MG tablet      Take 1/2 to 1 tablet at bedtime as needed for  sleep   30 tablet   1   . HYDROcodone-acetaminophen (NORCO/VICODIN) 5-325 MG per tablet   Oral   Take 1 tablet by mouth every 6 (six) hours as needed for moderate pain.   15 tablet   0    BP 159/68  Pulse 70  Temp(Src) 98.2 F (36.8 C) (Oral)  Resp 16  SpO2 97% Physical Exam  Nursing note and vitals reviewed. Constitutional: She is oriented to person, place, and time. No distress.  elderly  HENT:  Head: Normocephalic and atraumatic.  Cardiovascular: Normal rate, regular rhythm and normal heart sounds.   Pulmonary/Chest: Effort normal. No respiratory distress.  Abdominal: Soft. Bowel sounds are normal. There is no tenderness.  Musculoskeletal:  Tenderness to palpation of the left greater trochanter with full range of motion  Neurological: She is alert and oriented to person, place, and time.  Skin: Skin is warm and dry.  Psychiatric: She has a normal mood and affect.    ED Course  Procedures (including critical care time) Labs Review Labs Reviewed - No data to display Imaging Review Dg Hip Complete Left  12/04/2012   CLINICAL DATA:  Left hip pain without injury.  EXAM: LEFT HIP - COMPLETE 2+ VIEW  COMPARISON:  05/04/2012  FINDINGS: Interval left hemi arthroplasty. Arthroplasty head appears well seated in the acetabulum. No acute fracture or acute bony findings. Moderate degenerative articular space narrowing of the right hip.  IMPRESSION: 1. No specific complicating feature of the left hip hemiarthroplasty is observed. 2. Moderate axial articular space narrowing of the right hip compatible with degenerative arthropathy.   Electronically Signed   By: Herbie Baltimore M.D.   On: 12/04/2012 20:17    EKG Interpretation   None       MDM   1. Hip pain, chronic, left    Patient presents with left hip pain. This is in the setting of left hip replacement in April. She reports gradual worsening of pain and no new injury. She is nontoxic-appearing on exam. Imaging notable for  degenerative arthropathy. Patient had improvement of symptoms with Norco. Will discharge with a short course of Norco. Patient was encouraged followup with her primary care doctor and her surgeon for physical therapy.  After history, exam, and medical workup I feel the patient has been appropriately medically screened and is safe for discharge home. Pertinent diagnoses were discussed with the patient. Patient was given return precautions.     Shon Baton, MD 12/05/12 [phone]

## 2012-12-04 NOTE — ED Notes (Signed)
Patient reports that she has had left hip surgery 4/14. Patient states she had had gradual increase of left hip pain x 6 weeks. Patient states she was working in the house and the pain was severe.

## 2012-12-04 NOTE — ED Notes (Addendum)
Pt had L hip repair in April and has had pain ever since. Pt sts. The pain got much worse last night and today. Pt can stand and walk but it tires easily and feels weak. Denies injury, numbness and tingling. NAD noted, A&Ox4. Pt sts she feels like she can "feel the bone rubbing."

## 2012-12-08 ENCOUNTER — Ambulatory Visit (INDEPENDENT_AMBULATORY_CARE_PROVIDER_SITE_OTHER): Payer: Medicare Other | Admitting: Endocrinology

## 2012-12-08 ENCOUNTER — Encounter: Payer: Self-pay | Admitting: Endocrinology

## 2012-12-08 VITALS — BP 126/68 | HR 61 | Temp 98.3°F | Resp 10 | Ht 67.0 in | Wt 136.5 lb

## 2012-12-08 DIAGNOSIS — M810 Age-related osteoporosis without current pathological fracture: Secondary | ICD-10-CM | POA: Diagnosis not present

## 2012-12-08 DIAGNOSIS — E039 Hypothyroidism, unspecified: Secondary | ICD-10-CM

## 2012-12-08 DIAGNOSIS — M79609 Pain in unspecified limb: Secondary | ICD-10-CM | POA: Diagnosis not present

## 2012-12-08 DIAGNOSIS — E871 Hypo-osmolality and hyponatremia: Secondary | ICD-10-CM | POA: Diagnosis not present

## 2012-12-08 DIAGNOSIS — M79605 Pain in left leg: Secondary | ICD-10-CM

## 2012-12-08 DIAGNOSIS — R32 Unspecified urinary incontinence: Secondary | ICD-10-CM

## 2012-12-08 MED ORDER — DARIFENACIN HYDROBROMIDE ER 7.5 MG PO TB24: 7.5000 mg | ORAL_TABLET | Freq: Every day | ORAL | Status: DC

## 2012-12-08 MED ORDER — PREGABALIN 100 MG PO CAPS: 100.0000 mg | ORAL_CAPSULE | Freq: Two times a day (BID) | ORAL | Status: DC

## 2012-12-08 NOTE — Progress Notes (Signed)
Patient ID: Joanna [name], female   DOB: 08/19/1928, 79 y.o.   MRN: [id number]  Chief complaint: Followup   History of Present Illness:  She is here for followup of multiple issues:  1. History of osteoporosis/osteopenia. She was scheduled for bone density but did not show up for the appointment. Previously treated with Fosamax and does not want to take this because of possible side effects. Had recent multiple fractures from fall. Is taking calcium and vitamin D  2. Left leg pain. She has had pain since her surgery radiating down from the hip down to her lower leg. She has more pain when she is walking and is asking about possible arthritis and treatment for this. However has not followed up with her surgeon. Apparently physical therapy had tried some local application of the medication and this had helped but has stopped doing her therapy now. She was told at the ER she had arthritis.   3. Has history of hypothyroidism, mild and TSH has been consistently normal on 62.5 mcg daily. No unusual fatigue or weight change.  Lab Results  Component Value Date   TSH 0.54 11/05/2012   4. History of idiopathic peripheral neuropathy, usually has control of symptoms with gabapentin but she is getting more numbness and pain and she states that she may take up to 6 of the 300 mg gabapentin a day without getting adequate relief  5. She is complaining about urinary leakage and wearing a pad especially at night. She is also having some urgency and incontinence. Has seen a urologist before but did not try any medications adjusted and does not want followup consultation. No discomfort with urination  6. HYPERTENSION: She has had mild to moderate hypertension with usually good control and requiring small doses of medications. She ion, losartan  previous history of osteoporosis and has not had a bone density as scheduled again. She has been not been taking her Fosamax since she was concerned about possible side  effects  7. Mild hyponatremia: She has had asymptomatic hyponatremia at times. Last visit this was better with fluid restriction.       Medication List       This list is accurate as of: 12/08/12  3:23 PM.  Always use your most recent med list.               aspirin 81 MG tablet  Take 81 mg by mouth every other day.     b complex vitamins tablet  Take 1 tablet by mouth every morning.     budesonide-formoterol 160-4.5 MCG/ACT inhaler  Commonly known as:  SYMBICORT  Inhale 2 puffs into the lungs 2 (two) times daily.     calcium carbonate 1250 MG tablet  Commonly known as:  OS-CAL - dosed in mg of elemental calcium  Take 1 tablet by mouth every morning.     cholecalciferol 1000 UNITS tablet  Commonly known as:  VITAMIN D  Take 1,000 Units by mouth daily.     docusate sodium 100 MG capsule  Commonly known as:  COLACE  Take 1 capsule (100 mg total) by mouth 2 (two) times daily.     ferrous sulfate 325 (65 FE) MG tablet  Take 1 tablet (325 mg total) by mouth 3 (three) times daily after meals.     fish oil-omega-3 fatty acids 1000 MG capsule  Take 2 g by mouth daily.     flurbiprofen 100 MG tablet  Commonly known as:  ANSAID  Take  100 mg by mouth daily as needed (pain).     furosemide 20 MG tablet  Commonly known as:  LASIX  Take 20 mg by mouth daily.     gabapentin 300 MG capsule  Commonly known as:  NEURONTIN  Take 2 capsules (600 mg total) by mouth daily.     glucosamine-chondroitin 500-400 MG tablet  Take 1 tablet by mouth 3 (three) times a week.     HYDROcodone-acetaminophen 5-325 MG per tablet  Commonly known as:  NORCO/VICODIN  Take 1 tablet by mouth every 6 (six) hours as needed for moderate pain.     levothyroxine 125 MCG tablet  Commonly known as:  SYNTHROID, LEVOTHROID  Take 0.5 tablets (62.5 mcg total) by mouth at bedtime.     losartan 50 MG tablet  Commonly known as:  COZAAR  Take 1 tablet (50 mg total) by mouth every evening.      metoprolol 50 MG tablet  Commonly known as:  LOPRESSOR  Take 1 tablet (50 mg total) by mouth every evening.     multivitamin with minerals Tabs tablet  Take 1 tablet by mouth every morning.     nortriptyline 25 MG capsule  Commonly known as:  PAMELOR  Take 1 capsule (25 mg total) by mouth at bedtime.     omalizumab 150 MG injection  Commonly known as:  XOLAIR  Inject 150 mg into the skin every 28 (twenty-eight) days. Administered by Dr. Shelle Iron [phone]) [phone]     omeprazole 20 MG capsule  Commonly known as:  PRILOSEC  Take 20 mg by mouth every morning.     oxazepam 10 MG capsule  Commonly known as:  SERAX  TAKE ONE CAPSULE BY MOUTH EVERY NIGHT AT BEDTIME     oxyCODONE-acetaminophen 5-325 MG per tablet  Commonly known as:  PERCOCET/ROXICET  Take one tablet by mouth every 6 hours as needed for pain.     POTASSIUM GLUCONATE PO  Take by mouth. Unsure of dose     PROAIR HFA 108 (90 BASE) MCG/ACT inhaler  Generic drug:  albuterol  Inhale 2 puffs into the lungs every 6 (six) hours as needed for wheezing or shortness of breath.     simvastatin 20 MG tablet  Commonly known as:  ZOCOR  Take 1 tablet (20 mg total) by mouth every evening.     vitamin C 500 MG tablet  Commonly known as:  ASCORBIC ACID  Take 500 mg by mouth daily.        Allergies:  Allergies  Allergen Reactions  . Cefuroxime Axetil Hives  . Lactose Intolerance (Gi) Other (See Comments)    Gi upset  . Prednisone Other (See Comments)    Pt unsure of reaction  . Clarithromycin Palpitations  . Codeine Palpitations  . Sulfonamide Derivatives Palpitations    Past Medical History  Diagnosis Date  . IBS (irritable bowel syndrome)   . Hypothyroid   . GERD (gastroesophageal reflux disease)   . Hypertension   . Allergic rhinitis   . Asthma   . Complication of anesthesia     confusion after 04/2012    Past Surgical History  Procedure Laterality Date  . Mastectomy      double  . Bladder surgery    .  Nasal sinus surgery    . Hip arthroplasty Left 05/04/2012    Procedure: ARTHROPLASTY BIPOLAR HIP;  Surgeon: Shelda Pal, MD;  Location: [facility];  Service: Orthopedics;  Laterality: Left;  . Closed reduction wrist fracture  Left 05/04/2012    Procedure: CLOSED REDUCTION WRIST;  Surgeon: Shelda Pal, MD;  Location: [facility];  Service: Orthopedics;  Laterality: Left;  with casting  . Open reduction internal fixation (orif) distal radial fracture Left 05/29/2012    Procedure: OPEN REDUCTION INTERNAL FIXATION (ORIF) DISTAL RADIAL FRACTURE;  Surgeon: Sharma Covert, MD;  Location: [facility];  Service: Orthopedics;  Laterality: Left;    Family History  Problem Relation Age of Onset  . CAD Mother   . Hypertension Father   . Breast cancer Sister     Social History:  reports that she has never smoked. She has never used smokeless tobacco. She reports that she does not drink alcohol or use illicit drugs.  Review of Systems -   Has had COPD followed by pulmonologist  She has a history of hypercholesterolemia and has been taking her simvastatin regularly with adequate control  Lab Results  Component Value Date   CHOL 178 11/05/2012   HDL 70.60 11/05/2012   LDLCALC 86 08/08/2012   LDLDIRECT 93.8 11/05/2012   TRIG 217.0* 11/05/2012   CHOLHDL 3 11/05/2012    History of ankle edema: Not taking Lasix now and has no edema  History of chronic insomnia. She has tried various hypnotics for this and lately has been taking oxazepam  Exam:  BP 126/68  Pulse 61  Temp(Src) 98.3 F (36.8 C)  Resp 10  Ht 5\' 7"  (1.702 m)  Wt 136 lb 8 oz (61.916 kg)  BMI 21.37 kg/m2  SpO2 96%  no ankle edema  Assessment/Plan:   HYPERTENSION: Blood pressure was unusually high on the first measurement in the office today.Second measurement was normal, patient tends to get anxious. Advised her to have it checked periodically at the drug store also  Leg pain:Not a good candidate for nonsteroidal anti-inflammatory drugs  because of upper normal renal function  NEUROPATHY: She thinks she is not getting enough relief from gabapentin and is wondering about the brand name medication. She agrees to try Lyrica instead. She will check on the cost of this  OSTEOPOROSIS: Will need followup bone density to decide on future treatment and fracture prevention. She was scheduled for this but has not gone to the Center for the bone density. She would consider this if bone density is significantly low. She has had a history of multiple fractures this year.  Hypothyroidism: TSH normal on earlier visit this year  Overactive bladder: She appears to have urgency and incontinence. Will give her a trial of Enablex, discussed how this works and she will call if not improved   LABS: No visits with results within 3 Day(s) from this visit. Latest known visit with results is:  Appointment on 11/10/2012  Component Date Value Range Status  . Sodium 11/10/2012 133* 135 - 145 mEq/L Final  . Potassium 11/10/2012 4.4  3.5 - 5.1 mEq/L Final  . Chloride 11/10/2012 97  96 - 112 mEq/L Final  . CO2 11/10/2012 28  19 - 32 mEq/L Final  . Glucose, Bld 11/10/2012 77  70 - 99 mg/dL Final  . BUN 78/29/5621 17  6 - 23 mg/dL Final  . Creatinine, Ser 11/10/2012 0.9  0.4 - 1.2 mg/dL Final  . Calcium 30/86/5784 9.5  8.4 - 10.5 mg/dL Final  . GFR 69/62/9528 65.36  >60.00 mL/min Final

## 2012-12-08 NOTE — Patient Instructions (Addendum)
Start Enablex 1 daily   Selsun therapeutic shampoo

## 2012-12-09 DIAGNOSIS — R32 Unspecified urinary incontinence: Secondary | ICD-10-CM | POA: Insufficient documentation

## 2012-12-10 ENCOUNTER — Encounter: Payer: Self-pay | Admitting: *Deleted

## 2012-12-10 DIAGNOSIS — M25559 Pain in unspecified hip: Secondary | ICD-10-CM | POA: Diagnosis not present

## 2012-12-12 ENCOUNTER — Ambulatory Visit (INDEPENDENT_AMBULATORY_CARE_PROVIDER_SITE_OTHER): Payer: Medicare Other

## 2012-12-12 DIAGNOSIS — J45909 Unspecified asthma, uncomplicated: Secondary | ICD-10-CM

## 2012-12-15 ENCOUNTER — Telehealth: Payer: Self-pay | Admitting: *Deleted

## 2012-12-15 MED ORDER — OMALIZUMAB 150 MG ~~LOC~~ SOLR
150.0000 mg | Freq: Once | SUBCUTANEOUS | Status: AC
  Administered 2012-12-15: 150 mg via SUBCUTANEOUS

## 2012-12-15 NOTE — Telephone Encounter (Signed)
Neuropathy

## 2012-12-15 NOTE — Progress Notes (Signed)
Patient ID: Joanna [name], female   DOB: [birth year], 79 y.o.   MRN: [id number]     [facility]  Allergies  Allergen Reactions  . Cefuroxime Axetil Hives  . Lactose Intolerance (Gi) Other (See Comments)    Gi upset  . Prednisone Other (See Comments)    Pt unsure of reaction  . Clarithromycin Palpitations  . Codeine Palpitations  . Sulfonamide Derivatives Palpitations    Chief Complaint  Patient presents with  . Hospitalization Follow-up    HPI She is being seen for her hospitalization she in which she suffered a fall resulting in a left hip and left radial shaft fractures. She is here for short term rehab with her goal to return back home. She states that the prilosec is not effective in managing her gerd and that protonix worked better for her.   Past Medical History  Diagnosis Date  . IBS (irritable bowel syndrome)   . Hypothyroid   . GERD (gastroesophageal reflux disease)   . Hypertension   . Allergic rhinitis   . Asthma   . Complication of anesthesia     confusion after 04/2012    Past Surgical History  Procedure Laterality Date  . Mastectomy      double  . Bladder surgery    . Nasal sinus surgery    . Hip arthroplasty Left 05/04/2012    Procedure: ARTHROPLASTY BIPOLAR HIP;  Surgeon: Shelda Pal, MD;  Location: [facility];  Service: Orthopedics;  Laterality: Left;  . Closed reduction wrist fracture Left 05/04/2012    Procedure: CLOSED REDUCTION WRIST;  Surgeon: Shelda Pal, MD;  Location: [facility];  Service: Orthopedics;  Laterality: Left;  with casting    Filed Vitals:   05/08/12 1116  BP: 116/74  Pulse: 78  Height: 5\' 7"  (1.702 m)  Weight: 135 lb (61.236 kg)   MEDICATIONS  Asa 81 mg daily Albuterol 2 puffs every 6 hours as needed symbicort 160/4.5 mcg 2 puffs twice daily  Iron three times daily prilosec 20 mg daily mvi daily xolair 150 mcg monthly ansaid 100 mg daily as needed  vicodin 5/325 mg every 6 hours as needed   Review of Systems    Constitutional: Negative for malaise/fatigue.  Eyes: Negative for blurred vision.  Respiratory: Negative for cough and shortness of breath.   Cardiovascular: Negative for chest pain and palpitations.  Gastrointestinal: Positive for heartburn. Negative for abdominal pain and constipation.  Musculoskeletal: Negative for joint pain and myalgias.  Skin: Negative.   Neurological: Negative for dizziness and headaches.  Psychiatric/Behavioral: Negative for depression. The patient does not have insomnia.      Physical Exam  Constitutional: She is oriented to person, place, and time. No distress.  thin  Neck: Neck supple. No JVD present.  Cardiovascular: Normal rate, regular rhythm and intact distal pulses.   Respiratory: Effort normal and breath sounds normal. No respiratory distress. She has no wheezes.  GI: Soft. Bowel sounds are normal. She exhibits no distension.  Musculoskeletal: She exhibits no edema.  Is able to move all extremities; left hip without signs of infection present. Left arm in soft ace cast.   Neurological: She is alert and oriented to person, place, and time.  Skin: Skin is warm and dry. She is not diaphoretic.  Psychiatric: She has a normal mood and affect.     ASSESSMENT/PLAN  1. Left hip/left radial shaft fracture: will continue therapy as directed; will continue vicodin 5/325 mg every 6 hours as needed for  pain and ansaid 10 mg daily as needed and will monitor  2. Osteoporosis: will begin her on fosamax 70 mg weekly due to her recent fractures and will continue to monitor her status  3. Genella Rife; will change her prilosec to protonix 40 mg daily and will monitor  4. Asthma: is stable will continue albuterol 2 puffs every 6 hours as needed and xolair 150 mcg every month and will continue symbicort 160/4.5 mcg 2 puffs twice daily and will monitor     Time spent with patient 50 minutes.

## 2012-12-15 NOTE — Telephone Encounter (Signed)
Pt wants to know what she is taking Lyrica for?

## 2012-12-15 NOTE — Telephone Encounter (Signed)
Tried calling patient back several times, answering machine comes on then it immediately hangs up.

## 2012-12-16 ENCOUNTER — Other Ambulatory Visit: Payer: Self-pay | Admitting: Endocrinology

## 2012-12-18 ENCOUNTER — Ambulatory Visit (INDEPENDENT_AMBULATORY_CARE_PROVIDER_SITE_OTHER)
Admission: RE | Admit: 2012-12-18 | Discharge: 2012-12-18 | Disposition: A | Discharge status: Home / Self Care | Payer: Medicare Other | Source: Ambulatory Visit | Attending: Endocrinology | Admitting: Endocrinology

## 2012-12-18 ENCOUNTER — Other Ambulatory Visit: Payer: Medicare Other

## 2012-12-18 DIAGNOSIS — M25559 Pain in unspecified hip: Secondary | ICD-10-CM | POA: Diagnosis not present

## 2012-12-18 DIAGNOSIS — Z13820 Encounter for screening for osteoporosis: Secondary | ICD-10-CM

## 2012-12-18 DIAGNOSIS — M810 Age-related osteoporosis without current pathological fracture: Secondary | ICD-10-CM | POA: Diagnosis not present

## 2012-12-19 ENCOUNTER — Encounter: Payer: Self-pay | Admitting: Pulmonary Disease

## 2012-12-24 NOTE — Progress Notes (Signed)
Quick Note:  Please let patient know that the bone density low, can set her up for Reclast if ok with her ______

## 2012-12-30 ENCOUNTER — Other Ambulatory Visit: Payer: Self-pay | Admitting: *Deleted

## 2012-12-30 MED ORDER — OMEPRAZOLE 20 MG PO CPDR: 20.0000 mg | DELAYED_RELEASE_CAPSULE | Freq: Every morning | ORAL | Status: DC

## 2012-12-31 ENCOUNTER — Other Ambulatory Visit: Payer: Self-pay | Admitting: *Deleted

## 2012-12-31 ENCOUNTER — Other Ambulatory Visit: Payer: Self-pay | Admitting: Endocrinology

## 2012-12-31 DIAGNOSIS — M810 Age-related osteoporosis without current pathological fracture: Secondary | ICD-10-CM

## 2013-01-01 ENCOUNTER — Other Ambulatory Visit (INDEPENDENT_AMBULATORY_CARE_PROVIDER_SITE_OTHER): Payer: Medicare Other

## 2013-01-01 DIAGNOSIS — M810 Age-related osteoporosis without current pathological fracture: Secondary | ICD-10-CM

## 2013-01-01 LAB — BASIC METABOLIC PANEL
CO2: 30 mEq/L (ref 19–32)
Calcium: 8.9 mg/dL (ref 8.4–10.5)
GFR: 59.81 mL/min — ABNORMAL LOW (ref >60.00)
Potassium: 3.9 mEq/L (ref 3.5–5.1)
Sodium: 136 mEq/L (ref 135–145)

## 2013-01-12 ENCOUNTER — Ambulatory Visit: Payer: Medicare Other

## 2013-01-13 ENCOUNTER — Ambulatory Visit (INDEPENDENT_AMBULATORY_CARE_PROVIDER_SITE_OTHER): Payer: Medicare Other

## 2013-01-13 ENCOUNTER — Telehealth: Payer: Self-pay | Admitting: *Deleted

## 2013-01-13 DIAGNOSIS — J45909 Unspecified asthma, uncomplicated: Secondary | ICD-10-CM | POA: Diagnosis not present

## 2013-01-13 MED ORDER — OMALIZUMAB 150 MG ~~LOC~~ SOLR
150.0000 mg | Freq: Once | SUBCUTANEOUS | Status: AC
  Administered 2013-01-13: 150 mg via SUBCUTANEOUS

## 2013-01-13 NOTE — Telephone Encounter (Signed)
May be from taking Lyrica, stop this and go back to gabapentin

## 2013-01-13 NOTE — Telephone Encounter (Signed)
Pt is calling about her ankles swelling, I'm unable to reach patient by phone. Please advise

## 2013-01-20 NOTE — Telephone Encounter (Signed)
Noted, pt is aware 

## 2013-01-23 ENCOUNTER — Other Ambulatory Visit (HOSPITAL_COMMUNITY): Payer: Self-pay | Admitting: Internal Medicine

## 2013-01-23 ENCOUNTER — Encounter (HOSPITAL_COMMUNITY): Payer: Self-pay

## 2013-01-23 ENCOUNTER — Ambulatory Visit (HOSPITAL_COMMUNITY)
Admission: RE | Admit: 2013-01-23 | Discharge: 2013-01-23 | Disposition: A | Discharge status: Home / Self Care | Payer: Medicare Other | Source: Ambulatory Visit | Attending: Endocrinology | Admitting: Endocrinology

## 2013-01-23 DIAGNOSIS — M810 Age-related osteoporosis without current pathological fracture: Secondary | ICD-10-CM | POA: Diagnosis not present

## 2013-01-23 MED ORDER — ZOLEDRONIC ACID 5 MG/100ML IV SOLN
5.0000 mg | Freq: Once | INTRAVENOUS | Status: AC
  Administered 2013-01-23: 5 mg via INTRAVENOUS
  Filled 2013-01-23: qty 100

## 2013-01-23 MED ORDER — SODIUM CHLORIDE 0.9 % IV SOLN
Freq: Once | INTRAVENOUS | Status: AC
  Administered 2013-01-23: 14:00:00 via INTRAVENOUS

## 2013-01-23 MED ORDER — ZOLEDRONIC ACID 5 MG/100ML IV SOLN: 5.0000 mg | Freq: Once | INTRAVENOUS | Status: DC

## 2013-01-23 NOTE — Discharge Instructions (Signed)
Drink  Fluids/water as tolerated over the next 72 hours Tylenol or ibuprofen if needed for aches/pains next few days Continue Calcium and Vit D as directed by your MD Call your MD with any problems or questions.    Reclast Zoledronic Acid injection (Paget's Disease, Osteoporosis) What is this medicine? ZOLEDRONIC ACID (ZOE le dron ik AS id) lowers the amount of calcium loss from bone. It is used to treat Paget's disease and osteoporosis in women. This medicine may be used for other purposes; ask your health care provider or pharmacist if you have questions. COMMON BRAND NAME(S): Reclast, Zometa What should I tell my health care provider before I take this medicine? They need to know if you have any of these conditions: -aspirin-sensitive asthma -cancer, especially if you are receiving medicines used to treat cancer -dental disease or wear dentures -infection -kidney disease -low levels of calcium in the blood -past surgery on the parathyroid gland or intestines -receiving corticosteroids like dexamethasone or prednisone -an unusual or allergic reaction to zoledronic acid, other medicines, foods, dyes, or preservatives -pregnant or trying to get pregnant -breast-feeding How should I use this medicine? This medicine is for infusion into a vein. It is given by a health care professional in a hospital or clinic setting. Talk to your pediatrician regarding the use of this medicine in children. This medicine is not approved for use in children. Overdosage: If you think you have taken too much of this medicine contact a poison control center or emergency room at once. NOTE: This medicine is only for you. Do not share this medicine with others. What if I miss a dose? It is important not to miss your dose. Call your doctor or health care professional if you are unable to keep an appointment. What may interact with this medicine? -certain antibiotics given by injection -NSAIDs, medicines for  pain and inflammation, like ibuprofen or naproxen -some diuretics like bumetanide, furosemide -teriparatide This list may not describe all possible interactions. Give your health care provider a list of all the medicines, herbs, non-prescription drugs, or dietary supplements you use. Also tell them if you smoke, drink alcohol, or use illegal drugs. Some items may interact with your medicine. What should I watch for while using this medicine? Visit your doctor or health care professional for regular checkups. It may be some time before you see the benefit from this medicine. Do not stop taking your medicine unless your doctor tells you to. Your doctor may order blood tests or other tests to see how you are doing. Women should inform their doctor if they wish to become pregnant or think they might be pregnant. There is a potential for serious side effects to an unborn child. Talk to your health care professional or pharmacist for more information. You should make sure that you get enough calcium and vitamin D while you are taking this medicine. Discuss the foods you eat and the vitamins you take with your health care professional. Some people who take this medicine have severe bone, joint, and/or muscle pain. This medicine may also increase your risk for jaw problems or a broken thigh bone. Tell your doctor right away if you have severe pain in your jaw, bones, joints, or muscles. Tell your doctor if you have any pain that does not go away or that gets worse. Tell your dentist and dental surgeon that you are taking this medicine. You should not have major dental surgery while on this medicine. See your dentist to have  a dental exam and fix any dental problems before starting this medicine. Take good care of your teeth while on this medicine. Make sure you see your dentist for regular follow-up appointments. What side effects may I notice from receiving this medicine? Side effects that you should report to  your doctor or health care professional as soon as possible: -allergic reactions like skin rash, itching or hives, swelling of the face, lips, or tongue -anxiety, confusion, or depression -breathing problems -changes in vision -eye pain -feeling faint or lightheaded, falls -jaw pain, especially after dental work -mouth sores -muscle cramps, stiffness, or weakness -trouble passing urine or change in the amount of urine Side effects that usually do not require medical attention (report to your doctor or health care professional if they continue or are bothersome): -bone, joint, or muscle pain -constipation -diarrhea -fever -hair loss -irritation at site where injected -loss of appetite -nausea, vomiting -stomach upset -trouble sleeping -trouble swallowing -weak or tired This list may not describe all possible side effects. Call your doctor for medical advice about side effects. You may report side effects to FDA at 1-800-FDA-1088. Where should I keep my medicine? This drug is given in a hospital or clinic and will not be stored at home. NOTE: This sheet is a summary. It may not cover all possible information. If you have questions about this medicine, talk to your doctor, pharmacist, or health care provider.  2014, [employer]. (2012-06-16 10:03:48)

## 2013-02-10 ENCOUNTER — Ambulatory Visit (INDEPENDENT_AMBULATORY_CARE_PROVIDER_SITE_OTHER): Payer: Medicare Other

## 2013-02-10 DIAGNOSIS — Z961 Presence of intraocular lens: Secondary | ICD-10-CM | POA: Diagnosis not present

## 2013-02-10 DIAGNOSIS — J45909 Unspecified asthma, uncomplicated: Secondary | ICD-10-CM | POA: Diagnosis not present

## 2013-02-10 DIAGNOSIS — H353190 Nonexudative age-related macular degeneration, unspecified eye, stage unspecified: Secondary | ICD-10-CM | POA: Diagnosis not present

## 2013-02-10 DIAGNOSIS — H52229 Regular astigmatism, unspecified eye: Secondary | ICD-10-CM | POA: Diagnosis not present

## 2013-02-10 DIAGNOSIS — H5231 Anisometropia: Secondary | ICD-10-CM | POA: Diagnosis not present

## 2013-02-11 MED ORDER — OMALIZUMAB 150 MG ~~LOC~~ SOLR
150.0000 mg | Freq: Once | SUBCUTANEOUS | Status: AC
  Administered 2013-02-11: 150 mg via SUBCUTANEOUS

## 2013-02-12 DIAGNOSIS — Z96649 Presence of unspecified artificial hip joint: Secondary | ICD-10-CM | POA: Diagnosis not present

## 2013-02-12 DIAGNOSIS — M25559 Pain in unspecified hip: Secondary | ICD-10-CM | POA: Diagnosis not present

## 2013-02-12 DIAGNOSIS — M76899 Other specified enthesopathies of unspecified lower limb, excluding foot: Secondary | ICD-10-CM | POA: Diagnosis not present

## 2013-02-12 DIAGNOSIS — S72009A Fracture of unspecified part of neck of unspecified femur, initial encounter for closed fracture: Secondary | ICD-10-CM | POA: Diagnosis not present

## 2013-02-24 ENCOUNTER — Encounter: Payer: Self-pay | Admitting: Pulmonary Disease

## 2013-02-24 ENCOUNTER — Ambulatory Visit (INDEPENDENT_AMBULATORY_CARE_PROVIDER_SITE_OTHER): Payer: Medicare Other | Admitting: Pulmonary Disease

## 2013-02-24 VITALS — BP 116/70 | HR 59 | Temp 98.0°F | Ht 67.0 in | Wt 133.0 lb

## 2013-02-24 DIAGNOSIS — J449 Chronic obstructive pulmonary disease, unspecified: Secondary | ICD-10-CM

## 2013-02-24 NOTE — Patient Instructions (Signed)
Stay on your current medications, including xolair. Keep as active as possible. followup with me again in 76mos.

## 2013-02-24 NOTE — Progress Notes (Signed)
   Subjective:    Patient ID: Joanna [name], female    DOB: November 26, 1928, 80 y.o.   MRN: [id number]  HPI The patient comes in today for followup of her known chronic obstructive asthma. She is only good bronchodilator regimen, including Xolair. She has not had a recent acute exacerbation, and feels that her breathing is near her usual baseline. She continues to cough up mucus in the early mornings upon arising, but then it clears for the rest of the day.   Review of Systems  Constitutional: Negative for fever and unexpected weight change.  HENT: Negative for congestion, dental problem, ear pain, nosebleeds, postnasal drip, rhinorrhea, sinus pressure, sneezing, sore throat and trouble swallowing.   Eyes: Negative for redness and itching.  Respiratory: Positive for shortness of breath. Negative for cough, chest tightness and wheezing.   Cardiovascular: Negative for palpitations and leg swelling.  Gastrointestinal: Negative for nausea and vomiting.  Genitourinary: Negative for dysuria.  Musculoskeletal: Negative for joint swelling.  Skin: Negative for rash.  Neurological: Negative for headaches.  Hematological: Does not bruise/bleed easily.  Psychiatric/Behavioral: Negative for dysphoric mood. The patient is not nervous/anxious.        Objective:   Physical Exam Thin and frail-appearing female in no acute distress Nose without purulence or discharge noted Neck without lymphadenopathy or thyromegaly Chest with a few scattered crackles, no wheezing or rhonchi noted Cardiac exam with regular rate and rhythm Lower extremities with minimal ankle edema, no cyanosis Alert and oriented, moves all 4 extremities.       Assessment & Plan:

## 2013-02-24 NOTE — Assessment & Plan Note (Signed)
The patient appears to be stable from a pulmonary standpoint on her current regimen of bronchodilators and Xolair. I have stressed to her the importance of staying as active as possible, and she is to continue on her pulmonary meds. I would like to see her back at her usual appointment time in 4 months to check on progress.

## 2013-03-10 ENCOUNTER — Encounter: Payer: Self-pay | Admitting: Cardiology

## 2013-03-11 ENCOUNTER — Ambulatory Visit (INDEPENDENT_AMBULATORY_CARE_PROVIDER_SITE_OTHER): Payer: Medicare Other

## 2013-03-11 DIAGNOSIS — J45909 Unspecified asthma, uncomplicated: Secondary | ICD-10-CM

## 2013-03-13 MED ORDER — OMALIZUMAB 150 MG ~~LOC~~ SOLR
150.0000 mg | Freq: Once | SUBCUTANEOUS | Status: AC
  Administered 2013-03-13: 150 mg via SUBCUTANEOUS

## 2013-03-17 DIAGNOSIS — I10 Essential (primary) hypertension: Secondary | ICD-10-CM | POA: Diagnosis not present

## 2013-03-17 DIAGNOSIS — E559 Vitamin D deficiency, unspecified: Secondary | ICD-10-CM | POA: Diagnosis not present

## 2013-03-17 DIAGNOSIS — K5900 Constipation, unspecified: Secondary | ICD-10-CM | POA: Diagnosis not present

## 2013-03-17 DIAGNOSIS — IMO0001 Reserved for inherently not codable concepts without codable children: Secondary | ICD-10-CM | POA: Diagnosis not present

## 2013-03-17 DIAGNOSIS — G609 Hereditary and idiopathic neuropathy, unspecified: Secondary | ICD-10-CM | POA: Diagnosis not present

## 2013-03-17 DIAGNOSIS — E7800 Pure hypercholesterolemia, unspecified: Secondary | ICD-10-CM | POA: Diagnosis not present

## 2013-03-17 DIAGNOSIS — E039 Hypothyroidism, unspecified: Secondary | ICD-10-CM | POA: Diagnosis not present

## 2013-03-20 ENCOUNTER — Emergency Department (HOSPITAL_COMMUNITY): Payer: Medicare Other

## 2013-03-20 ENCOUNTER — Emergency Department (HOSPITAL_COMMUNITY)
Admission: EM | Admit: 2013-03-20 | Discharge: 2013-03-20 | Disposition: A | Discharge status: Home / Self Care | Payer: Medicare Other | Attending: Emergency Medicine | Admitting: Emergency Medicine

## 2013-03-20 ENCOUNTER — Encounter (HOSPITAL_COMMUNITY): Payer: Self-pay | Admitting: Emergency Medicine

## 2013-03-20 DIAGNOSIS — R05 Cough: Secondary | ICD-10-CM | POA: Diagnosis not present

## 2013-03-20 DIAGNOSIS — Z7982 Long term (current) use of aspirin: Secondary | ICD-10-CM | POA: Insufficient documentation

## 2013-03-20 DIAGNOSIS — J441 Chronic obstructive pulmonary disease with (acute) exacerbation: Secondary | ICD-10-CM | POA: Diagnosis not present

## 2013-03-20 DIAGNOSIS — I10 Essential (primary) hypertension: Secondary | ICD-10-CM | POA: Diagnosis not present

## 2013-03-20 DIAGNOSIS — J45901 Unspecified asthma with (acute) exacerbation: Secondary | ICD-10-CM

## 2013-03-20 DIAGNOSIS — J45909 Unspecified asthma, uncomplicated: Secondary | ICD-10-CM | POA: Diagnosis not present

## 2013-03-20 DIAGNOSIS — Z79899 Other long term (current) drug therapy: Secondary | ICD-10-CM | POA: Insufficient documentation

## 2013-03-20 DIAGNOSIS — R059 Cough, unspecified: Secondary | ICD-10-CM | POA: Diagnosis not present

## 2013-03-20 DIAGNOSIS — E039 Hypothyroidism, unspecified: Secondary | ICD-10-CM | POA: Insufficient documentation

## 2013-03-20 DIAGNOSIS — R0602 Shortness of breath: Secondary | ICD-10-CM | POA: Diagnosis not present

## 2013-03-20 DIAGNOSIS — K219 Gastro-esophageal reflux disease without esophagitis: Secondary | ICD-10-CM | POA: Diagnosis not present

## 2013-03-20 DIAGNOSIS — Z791 Long term (current) use of non-steroidal anti-inflammatories (NSAID): Secondary | ICD-10-CM | POA: Diagnosis not present

## 2013-03-20 LAB — I-STAT TROPONIN, ED: Troponin i, poc: 0.01 ng/mL (ref 0.00–0.08)

## 2013-03-20 LAB — BASIC METABOLIC PANEL
BUN: 16 mg/dL (ref 6–23)
CO2: 26 mEq/L (ref 19–32)
Calcium: 10 mg/dL (ref 8.4–10.5)
Chloride: 94 mEq/L — ABNORMAL LOW (ref 96–112)
Creatinine, Ser: 0.78 mg/dL (ref 0.50–1.10)
GFR calc Af Amer: 88 mL/min — ABNORMAL LOW (ref >90)
GFR, EST NON AFRICAN AMERICAN: 76 mL/min — AB (ref >90)
Glucose, Bld: 100 mg/dL — ABNORMAL HIGH (ref 70–99)
Potassium: 4 mEq/L (ref 3.7–5.3)
Sodium: 133 mEq/L — ABNORMAL LOW (ref 137–147)

## 2013-03-20 LAB — CBC
HCT: 35.9 % — ABNORMAL LOW (ref 36.0–46.0)
HEMOGLOBIN: 12.2 g/dL (ref 12.0–15.0)
MCH: 29.3 pg (ref 26.0–34.0)
MCHC: 34 g/dL (ref 30.0–36.0)
MCV: 86.3 fL (ref 78.0–100.0)
Platelets: 240 10*3/uL (ref 150–400)
RBC: 4.16 MIL/uL (ref 3.87–5.11)
RDW: 13.5 % (ref 11.5–15.5)
WBC: 6 10*3/uL (ref 4.0–10.5)

## 2013-03-20 MED ORDER — PREDNISONE 20 MG PO TABS: ORAL_TABLET | ORAL | Status: DC

## 2013-03-20 MED ORDER — IPRATROPIUM BROMIDE 0.02 % IN SOLN
0.5000 mg | Freq: Once | RESPIRATORY_TRACT | Status: AC
  Administered 2013-03-20: 0.5 mg via RESPIRATORY_TRACT
  Filled 2013-03-20: qty 2.5

## 2013-03-20 MED ORDER — ALBUTEROL SULFATE (2.5 MG/3ML) 0.083% IN NEBU
5.0000 mg | INHALATION_SOLUTION | Freq: Once | RESPIRATORY_TRACT | Status: AC
  Administered 2013-03-20: 5 mg via RESPIRATORY_TRACT
  Filled 2013-03-20: qty 6

## 2013-03-20 MED ORDER — PREDNISONE 20 MG PO TABS
60.0000 mg | ORAL_TABLET | Freq: Once | ORAL | Status: AC
  Administered 2013-03-20: 60 mg via ORAL
  Filled 2013-03-20: qty 3

## 2013-03-20 NOTE — Discharge Instructions (Signed)
Asthma, Acute Bronchospasm °Acute bronchospasm caused by asthma is also referred to as an asthma attack. Bronchospasm means your air passages become narrowed. The narrowing is caused by inflammation and tightening of the muscles in the air tubes (bronchi) in your lungs. This can make it hard to breath or cause you to wheeze and cough. °CAUSES °Possible triggers are: °· Animal dander from the skin, hair, or feathers of animals. °· Dust mites contained in house dust. °· Cockroaches. °· Pollen from trees or grass. °· Mold. °· Cigarette or tobacco smoke. °· Air pollutants such as dust, household cleaners, hair sprays, aerosol sprays, paint fumes, strong chemicals, or strong odors. °· Cold air or weather changes. Cold air may trigger inflammation. Winds increase molds and pollens in the air. °· Strong emotions such as crying or laughing hard. °· Stress. °· Certain medicines such as aspirin or beta-blockers. °· Sulfites in foods and drinks, such as dried fruits and wine. °· Infections or inflammatory conditions, such as a flu, cold, or inflammation of the nasal membranes (rhinitis). °· Gastroesophageal reflux disease (GERD). GERD is a condition where stomach acid backs up into your throat (esophagus). °· Exercise or strenuous activity. °SIGNS AND SYMPTOMS  °· Wheezing. °· Excessive coughing, particularly at night. °· Chest tightness. °· Shortness of breath. °DIAGNOSIS  °Your health care provider will ask you about your medical history and perform a physical exam. A chest X-ray or blood testing may be performed to look for other causes of your symptoms or other conditions that may have triggered your asthma attack.  °TREATMENT  °Treatment is aimed at reducing inflammation and opening up the airways in your lungs.  Most asthma attacks are treated with inhaled medicines. These include quick relief or rescue medicines (such as bronchodilators) and controller medicines (such as inhaled corticosteroids). These medicines are  sometimes given through an inhaler or a nebulizer. Systemic steroid medicine taken by mouth or given through an IV tube also can be used to reduce the inflammation when an attack is moderate or severe. Antibiotic medicines are only used if a bacterial infection is present.  °HOME CARE INSTRUCTIONS  °· Rest. °· Drink plenty of liquids. This helps the mucus to remain thin and be easily coughed up. Only use caffeine in moderation and do not use alcohol until you have recovered from your illness. °· Do not smoke. Avoid being exposed to secondhand smoke. °· You play a critical role in keeping yourself in good health. Avoid exposure to things that cause you to wheeze or to have breathing problems. °· Keep your medicines up to date and available. Carefully follow your health care provider's treatment plan. °· Take your medicine exactly as prescribed. °· When pollen or pollution is bad, keep windows closed and use an air conditioner or go to places with air conditioning. °· Asthma requires careful medical care. See your health care provider for a follow-up as advised. If you are more than [redacted] weeks pregnant and you were prescribed any new medicines, let your obstetrician know about the visit and how you are doing. Follow-up with your health care provider as directed. °· After you have recovered from your asthma attack, make an appointment with your outpatient doctor to talk about ways to reduce the likelihood of future attacks. If you do not have a doctor who manages your asthma, make an appointment with a primary care doctor to discuss your asthma. °SEEK IMMEDIATE MEDICAL CARE IF:  °· You are getting worse. °· You have trouble breathing. If severe, call   your local emergency services (911 in the [place]  You develop chest pain or discomfort.  You are vomiting.  You are not able to keep fluids down.  You are coughing up yellow, green, brown, or bloody sputum.  You have a fever and your symptoms suddenly get  worse.  You have trouble swallowing. MAKE SURE YOU:   Understand these instructions.  Will watch your condition.  Will get help right away if you are not doing well or get worse. Document Released: 04/18/2006 Document Revised: 09/03/2012 Document Reviewed: 07/09/2012 [facility] Patient Information 2014 [place], [place].   RETURN IMMEDIATELY IF you develop worse shortness of breath, confusion or altered mental status, a new rash, become dizzy, faint, or poorly responsive, or are unable to be cared for at home.  Increase your albuterol inhaler 2 puffs every 2 hours as needed for coughing and wheezing and shortness of breath until recheck by your Dr. within one week.

## 2013-03-20 NOTE — ED Provider Notes (Signed)
CSN: [id number]     Arrival date & time 03/20/13  1342 History   First MD Initiated Contact with Patient 03/20/13 [phone]     Chief Complaint  Patient presents with  . Shortness of Breath     (Consider location/radiation/quality/duration/timing/severity/associated sxs/prior Treatment) HPI 80 year old female lives at home alone at baseline has asthma shortness of breath chronically a daily basis with activity and COPD not on home oxygen, now complains of stable chronic cough unchanged the last few days for the last few days she has been wheezing more than baseline with more shortness of breath than baseline with shortness of breath mildly at rest and worse with activity compared to her baseline of the last few days with no fever no change in cough no chest pain no confusion no syncope no dizziness no trauma no vomiting no other concerns. She has partial improvement at home with her inhalers. She feels much better after nebulizer from EMS prior to arrival. She is now able to speak full sentences at rest again which is an improvement compared this morning prior to arrival. She feels as if one more nebulizer treatment now would make her ready for home. Past Medical History  Diagnosis Date  . IBS (irritable bowel syndrome)   . Hypothyroid   . GERD (gastroesophageal reflux disease)   . Hypertension   . Allergic rhinitis   . Asthma   . Complication of anesthesia     confusion after 04/2012   Past Surgical History  Procedure Laterality Date  . Mastectomy      double  . Bladder surgery    . Nasal sinus surgery    . Hip arthroplasty Left 05/04/2012    Procedure: ARTHROPLASTY BIPOLAR HIP;  Surgeon: Mauri Pole, MD;  Location: [facility];  Service: Orthopedics;  Laterality: Left;  . Closed reduction wrist fracture Left 05/04/2012    Procedure: CLOSED REDUCTION WRIST;  Surgeon: Mauri Pole, MD;  Location: [facility];  Service: Orthopedics;  Laterality: Left;  with casting  . Open reduction internal  fixation (orif) distal radial fracture Left 05/29/2012    Procedure: OPEN REDUCTION INTERNAL FIXATION (ORIF) DISTAL RADIAL FRACTURE;  Surgeon: Linna Hoff, MD;  Location: [facility];  Service: Orthopedics;  Laterality: Left;   Family History  Problem Relation Age of Onset  . CAD Mother   . Hypertension Father   . Breast cancer Sister    History  Substance Use Topics  . Smoking status: Never Smoker   . Smokeless tobacco: Never Used  . Alcohol Use: No   OB History   Grav Para Term Preterm Abortions TAB SAB Ect Mult Living                 Review of Systems 10 Systems reviewed and are negative for acute change except as noted in the HPI.   Allergies  Cefuroxime axetil; Lactose intolerance (gi); Prednisone; Clarithromycin; Codeine; and Sulfonamide derivatives  Home Medications   Current Outpatient Rx  Name  Route  Sig  Dispense  Refill  . albuterol (PROAIR HFA) 108 (90 BASE) MCG/ACT inhaler   Inhalation   Inhale 2 puffs into the lungs every 6 (six) hours as needed for wheezing or shortness of breath.          . naproxen sodium (ANAPROX) 220 MG tablet   Oral   Take 220 mg by mouth 2 (two) times daily with a meal.         . aspirin 81 MG tablet  Oral   Take 81 mg by mouth every other day.          . b complex vitamins tablet   Oral   Take 1 tablet by mouth every morning.         . budesonide-formoterol (SYMBICORT) 160-4.5 MCG/ACT inhaler   Inhalation   Inhale 2 puffs into the lungs 2 (two) times daily.           . calcium carbonate (OS-CAL - DOSED IN MG OF ELEMENTAL CALCIUM) 1250 MG tablet   Oral   Take 1 tablet by mouth every morning.         . cholecalciferol (VITAMIN D) 1000 UNITS tablet   Oral   Take 1,000 Units by mouth daily.         Marland Kitchen darifenacin (ENABLEX) 7.5 MG 24 hr tablet   Oral   Take 1 tablet (7.5 mg total) by mouth daily.   30 tablet   4   . ferrous sulfate 325 (65 FE) MG tablet   Oral   Take 1 tablet (325 mg total) by mouth 3  (three) times daily after meals.   30 tablet   0   . fish oil-omega-3 fatty acids 1000 MG capsule   Oral   Take 2 g by mouth daily.         . flurbiprofen (ANSAID) 100 MG tablet   Oral   Take 100 mg by mouth daily as needed (pain).          Marland Kitchen HYDROcodone-acetaminophen (NORCO/VICODIN) 5-325 MG per tablet   Oral   Take 1 tablet by mouth every 6 (six) hours as needed for moderate pain.   15 tablet   0   . levothyroxine (SYNTHROID, LEVOTHROID) 125 MCG tablet   Oral   Take 0.5 tablets (62.5 mcg total) by mouth at bedtime.   45 tablet   1   . losartan (COZAAR) 50 MG tablet   Oral   Take 1 tablet (50 mg total) by mouth every evening.   90 tablet   1   . metoprolol (LOPRESSOR) 50 MG tablet   Oral   Take 1 tablet (50 mg total) by mouth every evening.   90 tablet   1   . Multiple Vitamin (MULTIVITAMIN WITH MINERALS) TABS   Oral   Take 1 tablet by mouth every morning.         . nortriptyline (PAMELOR) 25 MG capsule   Oral   Take 25 mg by mouth at bedtime as needed.         Marland Kitchen omalizumab (XOLAIR) 150 MG injection   Subcutaneous   Inject 150 mg into the skin every 28 (twenty-eight) days. Administered by Dr. Gwenette Greet ([id number]) [phone]         . omeprazole (PRILOSEC) 20 MG capsule   Oral   Take 1 capsule (20 mg total) by mouth every morning.   90 capsule   3   . oxazepam (SERAX) 10 MG capsule      TAKE ONE CAPSULE BY MOUTH EVERY NIGHT AT BEDTIME   30 capsule   5   . POTASSIUM GLUCONATE PO   Oral   Take by mouth. Unsure of dose         . predniSONE (DELTASONE) 20 MG tablet      2 tabs po daily x 4 days   8 tablet   0   . pregabalin (LYRICA) 100 MG capsule   Oral   Take 1 capsule (100  mg total) by mouth 2 (two) times daily.   60 capsule   2   . simvastatin (ZOCOR) 20 MG tablet   Oral   Take 1 tablet (20 mg total) by mouth every evening.   90 tablet   1   . vitamin C (ASCORBIC ACID) 500 MG tablet   Oral   Take 500 mg by mouth daily.          . zoledronic acid (RECLAST) 5 MG/100ML SOLN injection   Intravenous   Inject 100 mLs (5 mg total) into the vein once.   100 mL   0    BP 130/69  Pulse 72  Temp(Src) 98.2 F (36.8 C) (Oral)  Resp 16  SpO2 100% Physical Exam  Nursing note and vitals reviewed. Constitutional:  Awake, alert, nontoxic appearance.  HENT:  Head: Atraumatic.  Eyes: Right eye exhibits no discharge. Left eye exhibits no discharge.  Neck: Neck supple.  Cardiovascular: Normal rate and regular rhythm.   No murmur heard. Pulmonary/Chest: She is in respiratory distress. She has wheezes. She has no rales. She exhibits no tenderness.  Mild respiratory distress with patient able to speak full sentences with pulse oximetry normal on room air 100% with mild scattered diffuse expiratory wheezes with no crackles no retractions no accessory muscle usage  Abdominal: Soft. She exhibits no distension. There is no tenderness. There is no rebound and no guarding.  Musculoskeletal: She exhibits no edema and no tenderness.  Baseline ROM, no obvious new focal weakness.  Neurological: She is alert.  Mental status and motor strength appears baseline for patient and situation.  Skin: No rash noted.  Psychiatric: She has a normal mood and affect.    ED Course  Procedures (including critical care time) Patient informed of clinical course, understand medical decision-making process, and agree with plan. Labs Review Labs Reviewed  BASIC METABOLIC PANEL - Abnormal; Notable for the following:    Sodium 133 (*)    Chloride 94 (*)    Glucose, Bld 100 (*)    GFR calc non Af Amer 76 (*)    GFR calc Af Amer 88 (*)    All other components within normal limits  CBC - Abnormal; Notable for the following:    HCT 35.9 (*)    All other components within normal limits  I-STAT TROPOININ, ED   Imaging Review Dg Chest 2 View (if Patient Has Fever And/or Copd)  03/20/2013   CLINICAL DATA:  Cough, shortness of breath, dizziness,  weakness, COPD  EXAM: CHEST  2 VIEW  COMPARISON:  05/06/2012  FINDINGS: Cardiomediastinal silhouette is stable. Hyperinflation again noted. Atherosclerotic calcifications of thoracic aorta. No acute infiltrate or pulmonary edema. Osteopenia and mild degenerative changes thoracic spine.  IMPRESSION: No active cardiopulmonary disease.  Hyperinflation again noted.   Electronically Signed   By: Lahoma Crocker M.D.   On: 03/20/2013 15:13     EKG Interpretation   Date/Time:  Friday March 20 2013 14:18:19 EST Ventricular Rate:  77 PR Interval:  139 QRS Duration: 96 QT Interval:  414 QTC Calculation: 468 R Axis:   64 Text Interpretation:  Sinus rhythm LVH with secondary repolarization  abnormality Compared to previous tracing ST depression in Inferior leads  NOW PRESENT Confirmed by [facility]  MD, Jenny Reichmann ([id number]) on 03/20/2013 3:22:39 PM      MDM   Final diagnoses:  Asthma attack    I doubt any other EMC precluding discharge at this time including, but not necessarily limited to the  following:ACS, sepsis.    Babette Relic, MD 03/22/13 [phone]

## 2013-03-20 NOTE — ED Notes (Signed)
Per EMS, Pt, from home, c/o increasing SOB and cold symptoms x 5 days.  Pt is able to speak in complete sentences.  NAD noted.  Vitals are stable.  Pt was given a breathing treatment en route.

## 2013-03-24 ENCOUNTER — Other Ambulatory Visit: Payer: Self-pay | Admitting: *Deleted

## 2013-03-24 ENCOUNTER — Telehealth: Payer: Self-pay | Admitting: Endocrinology

## 2013-03-24 ENCOUNTER — Encounter: Payer: Self-pay | Admitting: *Deleted

## 2013-03-24 ENCOUNTER — Telehealth: Payer: Self-pay | Admitting: *Deleted

## 2013-03-24 NOTE — Telephone Encounter (Signed)
Pt states she needs medication and has left a message earlier Please call pt to advise  Call back [phone]   Thank You :)

## 2013-03-24 NOTE — Telephone Encounter (Signed)
Unable to reach patient by phone, letter mailed with medication list

## 2013-03-24 NOTE — Telephone Encounter (Signed)
See below

## 2013-03-25 ENCOUNTER — Telehealth: Payer: Self-pay | Admitting: *Deleted

## 2013-03-25 ENCOUNTER — Other Ambulatory Visit: Payer: Self-pay | Admitting: *Deleted

## 2013-03-25 MED ORDER — LOSARTAN POTASSIUM 50 MG PO TABS: 50.0000 mg | ORAL_TABLET | Freq: Every evening | ORAL | Status: DC

## 2013-03-25 MED ORDER — METOPROLOL TARTRATE 50 MG PO TABS: 50.0000 mg | ORAL_TABLET | Freq: Every evening | ORAL | Status: DC

## 2013-03-25 MED ORDER — LEVOTHYROXINE SODIUM 125 MCG PO TABS: 62.5000 ug | ORAL_TABLET | Freq: Every day | ORAL | Status: DC

## 2013-03-25 NOTE — Telephone Encounter (Signed)
Pt calling she wants to know what the omeprazole is for? She is not going to get it right now. What did we call in for her and to where? She is confused as to what she is to be picking up.

## 2013-03-26 ENCOUNTER — Telehealth: Payer: Self-pay | Admitting: Endocrinology

## 2013-03-26 NOTE — Telephone Encounter (Signed)
Needs to be seen, and see if her pulmonologist Dr. Gwenette Greet can see her today or I can see her tomorrow

## 2013-03-26 NOTE — Telephone Encounter (Signed)
Pt is having a hard time breathing, SOB in right lung, has been going on for about 1 wk. Went to ER beginning of this week and is not feeling better

## 2013-03-26 NOTE — Telephone Encounter (Signed)
Unable to reach patient by phone 

## 2013-03-26 NOTE — Telephone Encounter (Signed)
Please see below and advise.

## 2013-03-30 NOTE — Telephone Encounter (Signed)
Pharmacy needed to verify if it was okay to dispense brand name Synthroid, verbally [facility]

## 2013-03-30 NOTE — Telephone Encounter (Signed)
Mail order pharmacy needs Dr. Dwyane Dee to call them to verify synthroid. # [phone]

## 2013-03-31 ENCOUNTER — Other Ambulatory Visit: Payer: Self-pay | Admitting: *Deleted

## 2013-03-31 MED ORDER — SYNTHROID 125 MCG PO TABS: 125.0000 ug | ORAL_TABLET | Freq: Every day | ORAL | Status: DC

## 2013-04-01 ENCOUNTER — Ambulatory Visit: Payer: Medicare Other | Admitting: Adult Health

## 2013-04-01 ENCOUNTER — Encounter: Payer: Self-pay | Admitting: Pulmonary Disease

## 2013-04-01 ENCOUNTER — Ambulatory Visit (INDEPENDENT_AMBULATORY_CARE_PROVIDER_SITE_OTHER): Payer: Medicare Other | Admitting: Pulmonary Disease

## 2013-04-01 VITALS — BP 112/60 | HR 52 | Temp 97.9°F | Ht 67.0 in | Wt 134.0 lb

## 2013-04-01 DIAGNOSIS — J449 Chronic obstructive pulmonary disease, unspecified: Secondary | ICD-10-CM

## 2013-04-01 MED ORDER — PREDNISONE 10 MG PO TABS: ORAL_TABLET | ORAL | Status: DC

## 2013-04-01 NOTE — Assessment & Plan Note (Signed)
The patient has known chronic obstructive asthma, and most recently has been having episodes of significant air trapping which results in dyspnea. She does not feel that she has a chest cold at this time, and has been compliant with her medications. I will treat her with a short course of prednisone, but if she does not improve, would consider adding anti-cholinergic therapy to her regimen.

## 2013-04-01 NOTE — Patient Instructions (Signed)
Will treat with a course of prednisone over 8 days.  If you get better, no change in your everyday medications.  If it doesn't help, will add a different type of inhaler to the symbicort. Stay as active as possible. Keep apptm already scheduled with me.

## 2013-04-01 NOTE — Progress Notes (Signed)
   Subjective:    Patient ID: Joanna [name], female    DOB: [birth year], 80 y.o.   MRN: [id number]  HPI The patient comes in today for an acute sick visit. She has known chronic obstructive asthma, and has been compliant with her medications. She had been doing well at the last visit, but had a recent ER visit with what she describes as classic air trapping. She had a chest x-ray that was unremarkable, and was treated with a few days of prednisone. She did get better for a few days, but worsened again as soon as she tapered off the prednisone. She does not feel that she is overly congested, nor is she producing purulent mucus. Again, she is describing classic air trapping symptoms.   Review of Systems  Constitutional: Negative for fever and unexpected weight change.  HENT: Negative for congestion, dental problem, ear pain, nosebleeds, postnasal drip, rhinorrhea, sinus pressure, sneezing, sore throat and trouble swallowing.   Eyes: Negative for redness and itching.  Respiratory: Positive for cough, chest tightness, shortness of breath and wheezing.        Chest congestion  Cardiovascular: Negative for palpitations and leg swelling.  Gastrointestinal: Negative for nausea and vomiting.  Genitourinary: Negative for dysuria.  Musculoskeletal: Negative for joint swelling.  Skin: Negative for rash.  Neurological: Negative for headaches.  Hematological: Does not bruise/bleed easily.  Psychiatric/Behavioral: Negative for dysphoric mood. The patient is not nervous/anxious.        Objective:   Physical Exam Thin female in no acute distress Nose without purulence or discharge noted Neck without lymphadenopathy or thyromegaly Chest with decreased breath sounds, no crackles or active wheezing Cardiac exam with regular rate and rhythm Lower extremities without edema, no cyanosis Alert and oriented, moves all 4 extremities.       Assessment & Plan:

## 2013-04-02 ENCOUNTER — Telehealth: Payer: Self-pay | Admitting: Endocrinology

## 2013-04-02 NOTE — Telephone Encounter (Signed)
pts daughter Vilinda Blanks would like you to call her   Call back: [phone]   Thank You :)

## 2013-04-06 ENCOUNTER — Other Ambulatory Visit (INDEPENDENT_AMBULATORY_CARE_PROVIDER_SITE_OTHER): Payer: Medicare Other

## 2013-04-06 DIAGNOSIS — M810 Age-related osteoporosis without current pathological fracture: Secondary | ICD-10-CM

## 2013-04-06 DIAGNOSIS — E871 Hypo-osmolality and hyponatremia: Secondary | ICD-10-CM

## 2013-04-06 DIAGNOSIS — E039 Hypothyroidism, unspecified: Secondary | ICD-10-CM

## 2013-04-06 LAB — COMPREHENSIVE METABOLIC PANEL
ALK PHOS: 33 U/L — AB (ref 39–117)
ALT: 22 U/L (ref 0–35)
AST: 24 U/L (ref 0–37)
Albumin: 4.2 g/dL (ref 3.5–5.2)
BUN: 26 mg/dL — ABNORMAL HIGH (ref 6–23)
CO2: 29 mEq/L (ref 19–32)
Calcium: 9.8 mg/dL (ref 8.4–10.5)
Chloride: 97 mEq/L (ref 96–112)
Creatinine, Ser: 1.3 mg/dL — ABNORMAL HIGH (ref 0.4–1.2)
GFR: 42.37 mL/min — ABNORMAL LOW (ref >60.00)
GLUCOSE: 107 mg/dL — AB (ref 70–99)
POTASSIUM: 3.6 meq/L (ref 3.5–5.1)
Sodium: 132 mEq/L — ABNORMAL LOW (ref 135–145)
TOTAL PROTEIN: 6.4 g/dL (ref 6.0–8.3)
Total Bilirubin: 1 mg/dL (ref 0.3–1.2)

## 2013-04-06 LAB — T4, FREE

## 2013-04-06 LAB — TSH: TSH: 0.89 u[IU]/mL (ref 0.35–5.50)

## 2013-04-07 LAB — VITAMIN D 25 HYDROXY (VIT D DEFICIENCY, FRACTURES): Vit D, 25-Hydroxy: 53 ng/mL (ref 30–89)

## 2013-04-08 ENCOUNTER — Ambulatory Visit: Payer: Medicare Other

## 2013-04-09 ENCOUNTER — Ambulatory Visit (INDEPENDENT_AMBULATORY_CARE_PROVIDER_SITE_OTHER): Payer: Medicare Other | Admitting: Endocrinology

## 2013-04-09 ENCOUNTER — Ambulatory Visit: Payer: Medicare Other

## 2013-04-09 ENCOUNTER — Encounter: Payer: Self-pay | Admitting: Endocrinology

## 2013-04-09 VITALS — BP 124/60 | HR 81 | Temp 98.2°F | Resp 16 | Ht 67.0 in | Wt 138.2 lb

## 2013-04-09 DIAGNOSIS — G609 Hereditary and idiopathic neuropathy, unspecified: Secondary | ICD-10-CM | POA: Diagnosis not present

## 2013-04-09 DIAGNOSIS — I10 Essential (primary) hypertension: Secondary | ICD-10-CM | POA: Diagnosis not present

## 2013-04-09 DIAGNOSIS — E039 Hypothyroidism, unspecified: Secondary | ICD-10-CM | POA: Diagnosis not present

## 2013-04-09 DIAGNOSIS — E871 Hypo-osmolality and hyponatremia: Secondary | ICD-10-CM

## 2013-04-09 DIAGNOSIS — M810 Age-related osteoporosis without current pathological fracture: Secondary | ICD-10-CM

## 2013-04-09 NOTE — Patient Instructions (Signed)
May refill Prilosec

## 2013-04-09 NOTE — Progress Notes (Signed)
Patient ID: Joanna [name], female   DOB: 08-Nov-1928, 80 y.o.   MRN: [id number]  Chief complaint: Followup   History of Present Illness:  Joanna [name] is here for followup of multiple issues:  1. History of osteoporosis/osteopenia. Joanna [name] had a bone density in 2014 which showed osteopenia with T score -2.3. Joanna [name] had refused to take Fosamax. Joanna [name] did get a Reclast infusion without any side effects in 01/2013 Joanna [name] is taking her calcium and vitamin D preparation as well as 1000 units of vitamin D 3. Vitamin D level is normal  Had  multiple fractures from fall in 2014  2. Dry Mouth: Joanna [name] is asking about this but is taking multiple medications that can potentially cause   3. Has history of hypothyroidism, mild and TSH has been consistently normal on 62.5 mcg daily. No unusual fatigue or weight change.  Lab Results  Component Value Date   TSH 0.89 04/06/2013   4. History of idiopathic peripheral neuropathy. Joanna [name] is having some pains in her legs but not much numbness. Joanna [name] has been given later, not clear if Joanna [name] is taking this.  5. Joanna [name] is complaining about urinary leakage and wearing a pad especially at night. Joanna [name] is also having some urgency and incontinence. Has seen a urologist before but did not try any medications adjusted and does not want followup consultation. No discomfort with urination  6. HYPERTENSION: Joanna [name] has had mild to moderate hypertension with usually good control and requiring small doses of medications. Joanna [name] on metoprolol and losartan    7. Mild hyponatremia: Joanna [name] has had asymptomatic hyponatremia at times.  sodium is borderline again  8. Medication review: Joanna [name] has a very long list of medications. Some are duplicate including the nonsteroidal drugs. Joanna [name] is asking about which medication is used for what purpose. Discussed these in detail. Advised her not to take naproxen especially with history of reflux. Joanna [name] does not need to take iron as Joanna [name] has not had any iron deficiency anemia recently.  Medication list updated     Medication List       This list is accurate as of: 04/09/13 11:59 PM.  Always use your most recent med list.               aspirin 81 MG tablet  Take 81 mg by mouth every other day.     b complex vitamins tablet  Take 1 tablet by mouth every morning.     budesonide-formoterol 160-4.5 MCG/ACT inhaler  Commonly known as:  SYMBICORT  Inhale 2 puffs into the lungs 2 (two) times daily.     calcium carbonate 1250 MG tablet  Commonly known as:  OS-CAL - dosed in mg of elemental calcium  Take 1 tablet by mouth every morning.     cholecalciferol 1000 UNITS tablet  Commonly known as:  VITAMIN D  Take 1,000 Units by mouth daily.     darifenacin 7.5 MG 24 hr tablet  Commonly known as:  ENABLEX  Take 1 tablet (7.5 mg total) by mouth daily.     fish oil-omega-3 fatty acids 1000 MG capsule  Take 2 g by mouth daily.     flurbiprofen 100 MG tablet  Commonly known as:  ANSAID  Take 100 mg by mouth daily as needed (pain).     HYDROcodone-acetaminophen 5-325 MG per tablet  Commonly known as:  NORCO/VICODIN  Take 1 tablet by mouth every 6 (six) hours as needed for moderate pain.     losartan 50 MG tablet  Commonly known as:  COZAAR  Take 1 tablet (50 mg total) by mouth every evening.     metoprolol 50 MG tablet  Commonly known as:  LOPRESSOR  Take 1 tablet (50 mg total) by mouth every evening.     multivitamin with minerals Tabs tablet  Take 1 tablet by mouth every morning.     nortriptyline 25 MG capsule  Commonly known as:  PAMELOR  Take 25 mg by mouth at bedtime as needed.     omalizumab 150 MG injection  Commonly known as:  XOLAIR  Inject 150 mg into the skin every 28 (twenty-eight) days. Administered by Dr. Gwenette Greet [phone]     omeprazole 20 MG capsule  Commonly known as:  PRILOSEC  Take 1 capsule (20 mg total) by mouth every morning.     oxazepam 10 MG capsule  Commonly known as:  SERAX  TAKE ONE CAPSULE BY MOUTH EVERY NIGHT AT  BEDTIME     pregabalin 100 MG capsule  Commonly known as:  LYRICA  Take 1 capsule (100 mg total) by mouth 2 (two) times daily.     PROAIR HFA 108 (90 BASE) MCG/ACT inhaler  Generic drug:  albuterol  Inhale 2 puffs into the lungs every 6 (six) hours as needed for wheezing or shortness of breath.     SYNTHROID 125 MCG tablet  Generic drug:  levothyroxine  Take 1/2 tablet daily DAW     vitamin C 500 MG tablet  Commonly known as:  ASCORBIC ACID  Take 500 mg by mouth daily.     zoledronic acid 5 MG/100ML Soln injection  Commonly known as:  RECLAST  Inject 100 mLs (5 mg total) into the vein once.        Allergies:  Allergies  Allergen Reactions  . Cefuroxime Axetil Hives  . Lactose Intolerance (Gi) Other (See Comments)    Gi upset  . Prednisone Other (See Comments)    Pt unsure of reaction  . Clarithromycin Palpitations  . Codeine Palpitations  . Sulfonamide Derivatives Palpitations    Past Medical History  Diagnosis Date  . IBS (irritable bowel syndrome)   . Hypothyroid   . GERD (gastroesophageal reflux disease)   . Hypertension   . Allergic rhinitis   . Asthma   . Complication of anesthesia     confusion after 04/2012    Past Surgical History  Procedure Laterality Date  . Mastectomy      double  . Bladder surgery    . Nasal sinus surgery    . Hip arthroplasty Left 05/04/2012    Procedure: ARTHROPLASTY BIPOLAR HIP;  Surgeon: Mauri Pole, MD;  Location: [facility];  Service: Orthopedics;  Laterality: Left;  . Closed reduction wrist fracture Left 05/04/2012    Procedure: CLOSED REDUCTION WRIST;  Surgeon: Mauri Pole, MD;  Location: [facility];  Service: Orthopedics;  Laterality: Left;  with casting  . Open reduction internal fixation (orif) distal radial fracture Left 05/29/2012    Procedure: OPEN REDUCTION INTERNAL FIXATION (ORIF) DISTAL RADIAL FRACTURE;  Surgeon: Linna Hoff, MD;  Location: [place];  Service: Orthopedics;  Laterality: Left;    Family History   Problem Relation Age of Onset  . CAD Mother   . Hypertension Father   . Breast cancer Sister     Social History:  reports that Joanna [name] has never smoked. Joanna [name] has never used smokeless tobacco. Joanna [name] reports that Joanna [name] does not drink alcohol or use illicit drugs.  Review of Systems -  Has had COPD followed by pulmonologist, recently given steroids for persistent shortness of breath despite treatment in ER for infection. Not dyspneic now  No weight loss recently, appetite fairly good  Wt Readings from Last 3 Encounters:  04/09/13 138 lb 3.2 oz (62.687 kg)  04/01/13 134 lb (60.782 kg)  02/24/13 133 lb (60.328 kg)    Joanna [name] has a history of hypercholesterolemia and has been taking her simvastatin regularly with adequate control  Lab Results  Component Value Date   CHOL 178 11/05/2012   HDL 70.60 11/05/2012   LDLCALC 86 08/08/2012   LDLDIRECT 93.8 11/05/2012   TRIG 217.0* 11/05/2012   CHOLHDL 3 11/05/2012    History of ankle edema: Not taking Lasix now and has no edema  Asking about heartburn. Joanna [name] had previously taken Prilosec with relief and is asking for another prescription  History of chronic insomnia. Joanna [name] has tried various hypnotics for this and lately has been taking oxazepam  Exam:  BP 124/60  Pulse 81  Temp(Src) 98.2 F (36.8 C)  Resp 16  Ht 5\' 7"  (1.702 m)  Wt 138 lb 3.2 oz (62.687 kg)  BMI 21.64 kg/m2  SpO2 97%  no ankle edema  Foot exam: Joanna [name] has normal pedal pulses, Joanna [name] does have decreased to absent distal monofilament sensation especially on small fourth and fifth toes No skin lesions or calluses.  Assessment/Plan:   HYPERTENSION: Blood pressure is better on this visit, continue same medications  NEUROPATHY: Joanna [name] is not very symptomatic at this time, continue current medications  OSTEOPOROSIS: Since Joanna [name] has osteopenia will do her Reclast infusion every other year. Continue vitamin D supplements  Hypothyroidism: TSH normal and Joanna [name] will continue same doses. Not  clear why her free T4 is high, probably lab error   Overactive bladder: Joanna [name] was given Enablex but  not clear if Joanna [name] is taking it. Still having some symptoms. However because of dry mouth may stop this  Medication review: This was done in detail the patient as discussed in history of present illness  Heartburn/reflux: Joanna [name] can refill Prilosec   LABS:  No visits with results within 3 Day(s) from this visit. Latest known visit with results is:  Appointment on 04/06/2013  Component Date Value Ref Range Status  . TSH 04/06/2013 0.89  0.35 - 5.50 uIU/mL Final  . Free T4 04/06/2013 7.03 Verified by manual dilution.* 0.60 - 1.60 ng/dL Final  . Sodium 16/10/960403/23/2015 132* 135 - 145 mEq/L Final  . Potassium 04/06/2013 3.6  3.5 - 5.1 mEq/L Final  . Chloride 04/06/2013 97  96 - 112 mEq/L Final  . CO2 04/06/2013 29  19 - 32 mEq/L Final  . Glucose, Bld 04/06/2013 107* 70 - 99 mg/dL Final  . BUN 54/09/811903/23/2015 26* 6 - 23 mg/dL Final  . Creatinine, Ser 04/06/2013 1.3* 0.4 - 1.2 mg/dL Final  . Total Bilirubin 04/06/2013 1.0  0.3 - 1.2 mg/dL Final  . Alkaline Phosphatase 04/06/2013 33* 39 - 117 U/L Final  . AST 04/06/2013 24  0 - 37 U/L Final  . ALT 04/06/2013 22  0 - 35 U/L Final  . Total Protein 04/06/2013 6.4  6.0 - 8.3 g/dL Final  . Albumin 14/78/295603/23/2015 4.2  3.5 - 5.2 g/dL Final  . Calcium 21/30/865703/23/2015 9.8  8.4 - 10.5 mg/dL Final  . GFR 84/69/629503/23/2015 42.37* >60.00 mL/min Final  . Vit D, 25-Hydroxy 04/06/2013 53  30 - 89 ng/mL Final   Comment: This assay accurately quantifies Vitamin D, which is the sum  of the                          25-Hydroxy forms of Vitamin D2 and D3.  Studies have shown that the                          optimum concentration of 25-Hydroxy Vitamin D is 30 ng/mL or higher.                           Concentrations of Vitamin D between 20 and 29 ng/mL are considered to                          be insufficient and concentrations less than 20 ng/mL are considered                          to be  deficient for Vitamin D.

## 2013-04-10 ENCOUNTER — Encounter: Payer: Self-pay | Admitting: Internal Medicine

## 2013-04-10 ENCOUNTER — Ambulatory Visit (INDEPENDENT_AMBULATORY_CARE_PROVIDER_SITE_OTHER): Payer: Medicare Other

## 2013-04-10 DIAGNOSIS — J45909 Unspecified asthma, uncomplicated: Secondary | ICD-10-CM

## 2013-04-13 MED ORDER — OMALIZUMAB 150 MG ~~LOC~~ SOLR
150.0000 mg | Freq: Once | SUBCUTANEOUS | Status: AC
Start: 2013-04-13 — End: 2013-04-13
  Administered 2013-04-13: 150 mg via SUBCUTANEOUS

## 2013-04-28 ENCOUNTER — Telehealth: Payer: Self-pay | Admitting: Endocrinology

## 2013-04-28 NOTE — Telephone Encounter (Signed)
She does not need to take iron as she has not had any iron deficiency anemia

## 2013-04-28 NOTE — Telephone Encounter (Signed)
Pt wants to know if she is to be taking iron and if so what dosage should she be taking. Please write her a script thank you

## 2013-04-28 NOTE — Telephone Encounter (Signed)
Noted, patient was advised.

## 2013-04-28 NOTE — Telephone Encounter (Signed)
Please see below and advise.

## 2013-05-07 DIAGNOSIS — M25559 Pain in unspecified hip: Secondary | ICD-10-CM | POA: Diagnosis not present

## 2013-05-07 DIAGNOSIS — M25569 Pain in unspecified knee: Secondary | ICD-10-CM | POA: Diagnosis not present

## 2013-05-07 DIAGNOSIS — M129 Arthropathy, unspecified: Secondary | ICD-10-CM | POA: Diagnosis not present

## 2013-05-07 DIAGNOSIS — Z9660 Presence of unspecified orthopedic joint implant: Secondary | ICD-10-CM | POA: Diagnosis not present

## 2013-05-13 ENCOUNTER — Ambulatory Visit (INDEPENDENT_AMBULATORY_CARE_PROVIDER_SITE_OTHER): Payer: Medicare Other

## 2013-05-13 DIAGNOSIS — J45909 Unspecified asthma, uncomplicated: Secondary | ICD-10-CM

## 2013-05-14 MED ORDER — OMALIZUMAB 150 MG ~~LOC~~ SOLR
150.0000 mg | Freq: Once | SUBCUTANEOUS | Status: AC
Start: 2013-05-14 — End: 2013-05-14
  Administered 2013-05-14: 150 mg via SUBCUTANEOUS

## 2013-06-04 DIAGNOSIS — Z9660 Presence of unspecified orthopedic joint implant: Secondary | ICD-10-CM | POA: Diagnosis not present

## 2013-06-04 DIAGNOSIS — M25559 Pain in unspecified hip: Secondary | ICD-10-CM | POA: Diagnosis not present

## 2013-06-04 DIAGNOSIS — M129 Arthropathy, unspecified: Secondary | ICD-10-CM | POA: Diagnosis not present

## 2013-06-04 DIAGNOSIS — M25569 Pain in unspecified knee: Secondary | ICD-10-CM | POA: Diagnosis not present

## 2013-06-15 ENCOUNTER — Ambulatory Visit (INDEPENDENT_AMBULATORY_CARE_PROVIDER_SITE_OTHER): Payer: Medicare Other

## 2013-06-15 DIAGNOSIS — J45909 Unspecified asthma, uncomplicated: Secondary | ICD-10-CM

## 2013-06-17 ENCOUNTER — Telehealth: Payer: Self-pay | Admitting: *Deleted

## 2013-06-17 MED ORDER — OMALIZUMAB 150 MG ~~LOC~~ SOLR
150.0000 mg | Freq: Once | SUBCUTANEOUS | Status: AC
  Administered 2013-06-17: 150 mg via SUBCUTANEOUS

## 2013-06-17 NOTE — Telephone Encounter (Signed)
Patient called, she left a rambling message on my vm that didn't make much sense, but she said that she has blood coming from her legs all the way up to her shoulders, I instructed her to go to the ED, but she said they would just ask who her Dr was. Please advise?

## 2013-06-17 NOTE — Telephone Encounter (Signed)
I spoke with patient, she really is not making any sense.  She said it looks like her feet and ankles have blood coming out of them, she said one spot on her left ankle had blood coming out, but it's not like the blood you could wipe off? I told her to go to the ED, she declined saying she had no one to drive her, I told her to call back tomorrow if not any better or go to ED.

## 2013-06-17 NOTE — Telephone Encounter (Signed)
She needs to tell us what the exact problem is

## 2013-06-17 NOTE — Telephone Encounter (Signed)
Need to see in office if able to

## 2013-06-23 ENCOUNTER — Other Ambulatory Visit: Payer: Self-pay | Admitting: Endocrinology

## 2013-06-24 ENCOUNTER — Encounter: Payer: Self-pay | Admitting: Pulmonary Disease

## 2013-06-24 ENCOUNTER — Ambulatory Visit (INDEPENDENT_AMBULATORY_CARE_PROVIDER_SITE_OTHER): Payer: Medicare Other | Admitting: Pulmonary Disease

## 2013-06-24 ENCOUNTER — Other Ambulatory Visit: Payer: Self-pay | Admitting: Endocrinology

## 2013-06-24 VITALS — BP 110/72 | HR 75 | Temp 97.6°F | Ht 67.0 in | Wt 129.6 lb

## 2013-06-24 DIAGNOSIS — J479 Bronchiectasis, uncomplicated: Secondary | ICD-10-CM | POA: Diagnosis not present

## 2013-06-24 DIAGNOSIS — J449 Chronic obstructive pulmonary disease, unspecified: Secondary | ICD-10-CM

## 2013-06-24 NOTE — Patient Instructions (Signed)
Ok to stop symbicort, but in its place try breo one inhalation each am.  Keep mouth rinsed out well.  Let me know in about 3 weeks how things are going, and if well, can send in prescription for your. Stay on xolair Do not put vicks vapor rub in your nose or just under your nose ever.  Can cause a special type of pna.  followup with me again in 35mos.

## 2013-06-24 NOTE — Progress Notes (Signed)
   Subjective:    Patient ID: Joanna [name], female    DOB: [birth year], 80 y.o.   MRN: [id number]  HPI Patient comes in today for followup of her known severe asthma with chronic obstructive pulmonary disease. She is staying on her Xolair injections, but has discontinued her Symbicort because she didn't feel it was helping her. She started using Vicks vapor rub in her nose and on her chest, and feels it has helped her breathing. I have cautioned her about using petroleum-based substances in her nasal airway. She has not required her rescue inhaler. She denies any significant chest congestion or purulence.   Review of Systems  Constitutional: Negative for fever and unexpected weight change.  HENT: Negative for congestion, dental problem, ear pain, nosebleeds, postnasal drip, rhinorrhea, sinus pressure, sneezing, sore throat and trouble swallowing.   Eyes: Negative for redness and itching.  Respiratory: Positive for cough. Negative for chest tightness, shortness of breath and wheezing.   Cardiovascular: Negative for palpitations and leg swelling.  Gastrointestinal: Negative for nausea and vomiting.  Genitourinary: Negative for dysuria.  Musculoskeletal: Negative for joint swelling.  Skin: Negative for rash.  Neurological: Negative for headaches.  Hematological: Does not bruise/bleed easily.  Psychiatric/Behavioral: Negative for dysphoric mood. The patient is not nervous/anxious.        Objective:   Physical Exam Thin female in no acute distress Nose without purulence or discharge noted Neck without lymphadenopathy or thyromegaly Chest with decreased breath sounds, a few rhonchi, no active wheezing Cardiac exam with regular rate and rhythm Lower extremities without edema, no cyanosis Alert and oriented, moves all 4 extremities.       Assessment & Plan:

## 2013-06-24 NOTE — Assessment & Plan Note (Signed)
The patient overall is near her usual baseline, but I have asked her to stay on some type of maintenance bronchodilator and inhaled corticosteroid. She does not feel the Symbicort is still helping her, and therefore will try her on something different. Also asked her to continue with Xolair, and to avoid petroleum based substances in her nasal airway.

## 2013-07-02 DIAGNOSIS — Z1331 Encounter for screening for depression: Secondary | ICD-10-CM | POA: Diagnosis not present

## 2013-07-02 DIAGNOSIS — G609 Hereditary and idiopathic neuropathy, unspecified: Secondary | ICD-10-CM | POA: Diagnosis not present

## 2013-07-02 DIAGNOSIS — E559 Vitamin D deficiency, unspecified: Secondary | ICD-10-CM | POA: Diagnosis not present

## 2013-07-02 DIAGNOSIS — R748 Abnormal levels of other serum enzymes: Secondary | ICD-10-CM | POA: Diagnosis not present

## 2013-07-02 DIAGNOSIS — K5900 Constipation, unspecified: Secondary | ICD-10-CM | POA: Diagnosis not present

## 2013-07-02 DIAGNOSIS — E039 Hypothyroidism, unspecified: Secondary | ICD-10-CM | POA: Diagnosis not present

## 2013-07-02 DIAGNOSIS — I10 Essential (primary) hypertension: Secondary | ICD-10-CM | POA: Diagnosis not present

## 2013-07-02 DIAGNOSIS — E7800 Pure hypercholesterolemia, unspecified: Secondary | ICD-10-CM | POA: Diagnosis not present

## 2013-07-02 DIAGNOSIS — R238 Other skin changes: Secondary | ICD-10-CM | POA: Diagnosis not present

## 2013-07-07 ENCOUNTER — Other Ambulatory Visit: Payer: Medicare Other

## 2013-07-10 ENCOUNTER — Ambulatory Visit: Payer: Medicare Other | Admitting: Endocrinology

## 2013-07-14 ENCOUNTER — Ambulatory Visit: Payer: Medicare Other

## 2013-07-23 DIAGNOSIS — M76899 Other specified enthesopathies of unspecified lower limb, excluding foot: Secondary | ICD-10-CM | POA: Diagnosis not present

## 2013-07-23 DIAGNOSIS — M25569 Pain in unspecified knee: Secondary | ICD-10-CM | POA: Diagnosis not present

## 2013-08-04 ENCOUNTER — Other Ambulatory Visit: Payer: Self-pay | Admitting: Rheumatology

## 2013-08-04 ENCOUNTER — Ambulatory Visit
Admission: RE | Admit: 2013-08-04 | Discharge: 2013-08-04 | Disposition: A | Discharge status: Home / Self Care | Payer: Medicare Other | Source: Ambulatory Visit | Attending: Rheumatology | Admitting: Rheumatology

## 2013-08-04 DIAGNOSIS — G609 Hereditary and idiopathic neuropathy, unspecified: Secondary | ICD-10-CM | POA: Diagnosis not present

## 2013-08-04 DIAGNOSIS — M545 Low back pain, unspecified: Secondary | ICD-10-CM | POA: Diagnosis not present

## 2013-08-04 DIAGNOSIS — M5137 Other intervertebral disc degeneration, lumbosacral region: Secondary | ICD-10-CM | POA: Diagnosis not present

## 2013-08-04 DIAGNOSIS — Z79899 Other long term (current) drug therapy: Secondary | ICD-10-CM | POA: Diagnosis not present

## 2013-08-04 DIAGNOSIS — R6889 Other general symptoms and signs: Secondary | ICD-10-CM | POA: Diagnosis not present

## 2013-08-04 DIAGNOSIS — IMO0002 Reserved for concepts with insufficient information to code with codable children: Secondary | ICD-10-CM | POA: Diagnosis not present

## 2013-08-04 DIAGNOSIS — M4310 Spondylolisthesis, site unspecified: Secondary | ICD-10-CM | POA: Diagnosis not present

## 2013-08-06 ENCOUNTER — Encounter (HOSPITAL_COMMUNITY): Payer: Self-pay | Admitting: Emergency Medicine

## 2013-08-06 ENCOUNTER — Emergency Department (HOSPITAL_COMMUNITY)
Admission: EM | Admit: 2013-08-06 | Discharge: 2013-08-06 | Disposition: A | Discharge status: Home / Self Care | Payer: Medicare Other | Attending: Emergency Medicine | Admitting: Emergency Medicine

## 2013-08-06 DIAGNOSIS — J45909 Unspecified asthma, uncomplicated: Secondary | ICD-10-CM | POA: Insufficient documentation

## 2013-08-06 DIAGNOSIS — Z79899 Other long term (current) drug therapy: Secondary | ICD-10-CM | POA: Diagnosis not present

## 2013-08-06 DIAGNOSIS — IMO0002 Reserved for concepts with insufficient information to code with codable children: Secondary | ICD-10-CM | POA: Insufficient documentation

## 2013-08-06 DIAGNOSIS — Z23 Encounter for immunization: Secondary | ICD-10-CM | POA: Insufficient documentation

## 2013-08-06 DIAGNOSIS — K219 Gastro-esophageal reflux disease without esophagitis: Secondary | ICD-10-CM | POA: Diagnosis not present

## 2013-08-06 DIAGNOSIS — E039 Hypothyroidism, unspecified: Secondary | ICD-10-CM | POA: Diagnosis not present

## 2013-08-06 DIAGNOSIS — Z7982 Long term (current) use of aspirin: Secondary | ICD-10-CM | POA: Diagnosis not present

## 2013-08-06 DIAGNOSIS — Y929 Unspecified place or not applicable: Secondary | ICD-10-CM | POA: Diagnosis not present

## 2013-08-06 DIAGNOSIS — I10 Essential (primary) hypertension: Secondary | ICD-10-CM | POA: Diagnosis not present

## 2013-08-06 DIAGNOSIS — Y9389 Activity, other specified: Secondary | ICD-10-CM | POA: Insufficient documentation

## 2013-08-06 DIAGNOSIS — S81009A Unspecified open wound, unspecified knee, initial encounter: Secondary | ICD-10-CM | POA: Diagnosis not present

## 2013-08-06 DIAGNOSIS — S91009A Unspecified open wound, unspecified ankle, initial encounter: Principal | ICD-10-CM

## 2013-08-06 DIAGNOSIS — S81809A Unspecified open wound, unspecified lower leg, initial encounter: Secondary | ICD-10-CM | POA: Diagnosis present

## 2013-08-06 MED ORDER — TETANUS-DIPHTH-ACELL PERTUSSIS 5-2.5-18.5 LF-MCG/0.5 IM SUSP
0.5000 mL | Freq: Once | INTRAMUSCULAR | Status: AC
  Administered 2013-08-06: 0.5 mL via INTRAMUSCULAR
  Filled 2013-08-06: qty 0.5

## 2013-08-06 NOTE — ED Notes (Signed)
Initial contact - pt a+ox4, reports a trash can fell over onto her L lower leg.  Pt with approx 2" skin tear noted to anterior aspect L shin.  No bleeding at this time.  Pt denies other complaints.  No blood thinners.  Skin otherwise PWD.  Ambulatory with steady gait.  NAD.

## 2013-08-06 NOTE — ED Provider Notes (Signed)
CSN: [id number]     Arrival date & time 08/06/13  2148 History  This chart was scribed for Junius Creamer, NP, working with Julianne Rice, MD by Steva Colder, ED Scribe. The patient was seen in room WTR6/WTR6 at 10:24 PM.    Chief Complaint  Patient presents with  . Laceration     The history is provided by the patient. No language interpreter was used.   Joanna [name] is a 80 y.o. female who was brought in by parents to the ED complaining of a laceration to her left lower leg. She states that she was taking her trash out and one of the large, plastic trash containers fell and hit her leg. She states that the she applied a pillowcase to the wound. She states that the bleeding is controlled at this time. She denies any other associated symptoms. She states that she is not aware of when her last tetanus shot was.   Past Medical History  Diagnosis Date  . IBS (irritable bowel syndrome)   . Hypothyroid   . GERD (gastroesophageal reflux disease)   . Hypertension   . Allergic rhinitis   . Asthma   . Complication of anesthesia     confusion after 04/2012   Past Surgical History  Procedure Laterality Date  . Mastectomy      double  . Bladder surgery    . Nasal sinus surgery    . Hip arthroplasty Left 05/04/2012    Procedure: ARTHROPLASTY BIPOLAR HIP;  Surgeon: Mauri Pole, MD;  Location: [facility];  Service: Orthopedics;  Laterality: Left;  . Closed reduction wrist fracture Left 05/04/2012    Procedure: CLOSED REDUCTION WRIST;  Surgeon: Mauri Pole, MD;  Location: [facility];  Service: Orthopedics;  Laterality: Left;  with casting  . Open reduction internal fixation (orif) distal radial fracture Left 05/29/2012    Procedure: OPEN REDUCTION INTERNAL FIXATION (ORIF) DISTAL RADIAL FRACTURE;  Surgeon: Linna Hoff, MD;  Location: [place];  Service: Orthopedics;  Laterality: Left;   Family History  Problem Relation Age of Onset  . CAD Mother   . Hypertension Father   . Breast cancer Sister     History  Substance Use Topics  . Smoking status: Never Smoker   . Smokeless tobacco: Never Used  . Alcohol Use: No   OB History   Grav Para Term Preterm Abortions TAB SAB Ect Mult Living                 Review of Systems  Skin: Positive for wound (laceration to the left lower leg).  All other systems reviewed and are negative.   Allergies  Cefuroxime axetil; Lactose intolerance (gi); Clarithromycin; Codeine; and Sulfonamide derivatives  Home Medications   Prior to Admission medications   Medication Sig Start Date End Date Taking? Authorizing Provider  albuterol (PROAIR HFA) 108 (90 BASE) MCG/ACT inhaler Inhale 2 puffs into the lungs every 6 (six) hours as needed for wheezing or shortness of breath.    Yes Historical Provider, MD  aspirin 81 MG tablet Take 81 mg by mouth every other day.    Yes Historical Provider, MD  b complex vitamins tablet Take 1 tablet by mouth every morning.   Yes Historical Provider, MD  cholecalciferol (VITAMIN D) 1000 UNITS tablet Take 1,000 Units by mouth daily.   Yes Historical Provider, MD  fish oil-omega-3 fatty acids 1000 MG capsule Take 2 g by mouth daily.   Yes Historical Provider, MD  gabapentin (  NEURONTIN) 300 MG capsule Take 2 capsules (600 mg  total) by mouth daily.   Yes Elayne Snare, MD  levothyroxine (SYNTHROID) 125 MCG tablet Take 1/2 tablet daily DAW 03/31/13  Yes Elayne Snare, MD  losartan (COZAAR) 50 MG tablet Take 1 tablet (50 mg total) by mouth every evening. 03/25/13  Yes Elayne Snare, MD  metoprolol (LOPRESSOR) 50 MG tablet Take 1 tablet (50 mg total) by mouth every evening. 03/25/13  Yes Elayne Snare, MD  Multiple Vitamin (MULTIVITAMIN WITH MINERALS) TABS Take 1 tablet by mouth every morning.   Yes Historical Provider, MD  naphazoline-pheniramine (NAPHCON-A) 0.025-0.3 % ophthalmic solution Place 2 drops into both eyes 4 (four) times daily as needed for irritation or allergies.   Yes Historical Provider, MD  nortriptyline (PAMELOR) 25 MG  capsule Take 25 mg by mouth at bedtime as needed for sleep.  11/24/12  Yes Elayne Snare, MD  omalizumab Arvid Right) 150 MG injection Inject 150 mg into the skin every 28 (twenty-eight) days. Administered by Dr. Gwenette Greet [phone]   Yes Historical Provider, MD  omeprazole (PRILOSEC) 20 MG capsule Take 1 capsule (20 mg total) by mouth every morning. 12/30/12  Yes Elayne Snare, MD  oxazepam (SERAX) 10 MG capsule Take 10 mg by mouth at bedtime as needed for sleep or anxiety.   Yes Historical Provider, MD  predniSONE (DELTASONE) 5 MG tablet Take 5 mg by mouth daily with breakfast.   Yes Historical Provider, MD  vitamin C (ASCORBIC ACID) 500 MG tablet Take 500 mg by mouth daily.   Yes Historical Provider, MD  zoledronic acid (RECLAST) 5 MG/100ML SOLN injection Inject 100 mLs (5 mg total) into the vein once. 01/23/13  Yes Elayne Snare, MD   BP 161/72  Pulse 85  Temp(Src) 98.2 F (36.8 C) (Oral)  Resp 18  SpO2 97%  Physical Exam  Nursing note and vitals reviewed. Constitutional: She is oriented to person, place, and time. She appears well-developed and well-nourished. No distress.  HENT:  Head: Normocephalic and atraumatic.  Eyes: EOM are normal.  Neck: Neck supple. No tracheal deviation present.  Cardiovascular: Normal rate.   Pulmonary/Chest: Effort normal. No respiratory distress.  Musculoskeletal: Normal range of motion.  Neurological: She is alert and oriented to person, place, and time.  Skin: Skin is warm and dry.  Superficial evulsion anterior left shin. Approximately 5 cm round area. No active bleeding.   Psychiatric: She has a normal mood and affect. Her behavior is normal.    ED Course  LACERATION REPAIR Date/Time: 08/06/2013 11:18 PM Performed by: Garald Balding Authorized by: Garald Balding Consent: Verbal consent obtained. written consent not obtained. Consent given by: patient Patient understanding: patient states understanding of the procedure being performed Patient identity  confirmed: verbally with patient Time out: Immediately prior to procedure a "time out" was called to verify the correct patient, procedure, equipment, support staff and site/side marked as required. Body area: lower extremity Location details: left lower leg Laceration length: 8 cm Foreign bodies: no foreign bodies Tendon involvement: none Nerve involvement: none Vascular damage: no Anesthetic total: 0 ml Irrigation solution: saline Amount of cleaning: standard Debridement: none Degree of undermining: none Skin closure: Steri-Strips Number of sutures: 5 Approximation: loose Approximation difficulty: simple Dressing: non-adhesive packing strip and gauze roll Patient tolerance: Patient tolerated the procedure well with no immediate complications.   (including critical care time) DIAGNOSTIC STUDIES: Oxygen Saturation is 97% on room air, normal by my interpretation.    COORDINATION OF CARE: 10:28  PM-Discussed treatment plan which includes Steri-Strips with pt at bedside and pt agreed to plan.   Labs Review Labs Reviewed - No data to display  Imaging Review No results found.   EKG Interpretation None      MDM   Final diagnoses:  Laceration       I personally performed the services described in this documentation, which was scribed in my presence. The recorded information has been reviewed and is accurate.    Garald Balding, NP 08/06/13 [phone]

## 2013-08-06 NOTE — ED Notes (Addendum)
Pt reports that at approximately at [place] she was taking her trash out and one of the large, plastic trash containers fell and hit her left lower leg. Pt reports having a laceration, pt applied a pillowcase to the wound. Bleeding is controlled at this time. Pt is unaware of her last tetanus vaccination.

## 2013-08-06 NOTE — Discharge Instructions (Signed)
Steri-Strips your office, your skin normal.  If try to keep the area covered with a dressing, just for protection.  Watch for any signs of infection, such as, redness, pain, swelling, fever, or pus from the, area.  I would like you to make an appointment with your primary care physician to have his wound checked early next week.  Please do not use any ointments or lotions over the Steri-Strips as this will cause him to prematurity.  Peel-off

## 2013-08-07 NOTE — ED Provider Notes (Signed)
Medical screening examination/treatment/procedure(s) were performed by non-physician practitioner and as supervising physician I was immediately available for consultation/collaboration.   EKG Interpretation None        Julianne Rice, MD 08/07/13 [phone]

## 2013-08-16 ENCOUNTER — Encounter (HOSPITAL_COMMUNITY): Payer: Self-pay | Admitting: Emergency Medicine

## 2013-08-16 ENCOUNTER — Emergency Department (HOSPITAL_COMMUNITY)
Admission: EM | Admit: 2013-08-16 | Discharge: 2013-08-16 | Disposition: A | Discharge status: Home / Self Care | Payer: Medicare Other | Attending: Emergency Medicine | Admitting: Emergency Medicine

## 2013-08-16 DIAGNOSIS — E039 Hypothyroidism, unspecified: Secondary | ICD-10-CM | POA: Diagnosis not present

## 2013-08-16 DIAGNOSIS — J45909 Unspecified asthma, uncomplicated: Secondary | ICD-10-CM | POA: Diagnosis not present

## 2013-08-16 DIAGNOSIS — L03116 Cellulitis of left lower limb: Secondary | ICD-10-CM

## 2013-08-16 DIAGNOSIS — M7989 Other specified soft tissue disorders: Secondary | ICD-10-CM | POA: Diagnosis not present

## 2013-08-16 DIAGNOSIS — Z79899 Other long term (current) drug therapy: Secondary | ICD-10-CM | POA: Insufficient documentation

## 2013-08-16 DIAGNOSIS — I10 Essential (primary) hypertension: Secondary | ICD-10-CM | POA: Diagnosis not present

## 2013-08-16 DIAGNOSIS — L03119 Cellulitis of unspecified part of limb: Secondary | ICD-10-CM | POA: Diagnosis not present

## 2013-08-16 DIAGNOSIS — L02419 Cutaneous abscess of limb, unspecified: Secondary | ICD-10-CM | POA: Insufficient documentation

## 2013-08-16 DIAGNOSIS — K219 Gastro-esophageal reflux disease without esophagitis: Secondary | ICD-10-CM | POA: Diagnosis not present

## 2013-08-16 LAB — CBC WITH DIFFERENTIAL/PLATELET
BASOS PCT: 0 % (ref 0–1)
Basophils Absolute: 0 10*3/uL (ref 0.0–0.1)
EOS PCT: 1 % (ref 0–5)
Eosinophils Absolute: 0.1 10*3/uL (ref 0.0–0.7)
HEMATOCRIT: 37.4 % (ref 36.0–46.0)
HEMOGLOBIN: 12.6 g/dL (ref 12.0–15.0)
Lymphocytes Relative: 16 % (ref 12–46)
Lymphs Abs: 1.9 10*3/uL (ref 0.7–4.0)
MCH: 30.2 pg (ref 26.0–34.0)
MCHC: 33.7 g/dL (ref 30.0–36.0)
MCV: 89.7 fL (ref 78.0–100.0)
MONO ABS: 0.9 10*3/uL (ref 0.1–1.0)
MONOS PCT: 8 % (ref 3–12)
Neutro Abs: 8.4 10*3/uL — ABNORMAL HIGH (ref 1.7–7.7)
Neutrophils Relative %: 75 % (ref 43–77)
Platelets: 279 10*3/uL (ref 150–400)
RBC: 4.17 MIL/uL (ref 3.87–5.11)
RDW: 13.4 % (ref 11.5–15.5)
WBC: 11.3 10*3/uL — ABNORMAL HIGH (ref 4.0–10.5)

## 2013-08-16 LAB — COMPREHENSIVE METABOLIC PANEL
ALBUMIN: 3.5 g/dL (ref 3.5–5.2)
ALT: 15 U/L (ref 0–35)
ANION GAP: 11 (ref 5–15)
AST: 22 U/L (ref 0–37)
Alkaline Phosphatase: 46 U/L (ref 39–117)
BUN: 19 mg/dL (ref 6–23)
CALCIUM: 9.1 mg/dL (ref 8.4–10.5)
CO2: 27 mEq/L (ref 19–32)
CREATININE: 0.85 mg/dL (ref 0.50–1.10)
Chloride: 96 mEq/L (ref 96–112)
GFR calc Af Amer: 72 mL/min — ABNORMAL LOW (ref >90)
GFR calc non Af Amer: 62 mL/min — ABNORMAL LOW (ref >90)
Glucose, Bld: 88 mg/dL (ref 70–99)
Potassium: 4.3 mEq/L (ref 3.7–5.3)
Sodium: 134 mEq/L — ABNORMAL LOW (ref 137–147)
Total Bilirubin: 0.4 mg/dL (ref 0.3–1.2)
Total Protein: 6.4 g/dL (ref 6.0–8.3)

## 2013-08-16 MED ORDER — ACETAMINOPHEN 325 MG PO TABS
650.0000 mg | ORAL_TABLET | Freq: Once | ORAL | Status: AC
  Administered 2013-08-16: 650 mg via ORAL

## 2013-08-16 MED ORDER — CLINDAMYCIN HCL 150 MG PO CAPS: 300.0000 mg | ORAL_CAPSULE | Freq: Four times a day (QID) | ORAL | Status: DC

## 2013-08-16 MED ORDER — CLINDAMYCIN HCL 300 MG PO CAPS
300.0000 mg | ORAL_CAPSULE | Freq: Once | ORAL | Status: AC
  Administered 2013-08-16: 300 mg via ORAL
  Filled 2013-08-16: qty 1

## 2013-08-16 NOTE — Discharge Instructions (Signed)

## 2013-08-16 NOTE — ED Notes (Signed)
The pt is c/o pain in her lt lower leg after she fell the end of July.  She had a large abrasion ot the lower leg and the wound is not healing and the skin over the abrasion is darker and the wound is draining and there is redness surrounding the wound with increased   pain

## 2013-08-16 NOTE — ED Provider Notes (Signed)
CSN: [id number]     Arrival date & time 08/16/13  1812 History   First MD Initiated Contact with Patient 08/16/13 2150     Chief Complaint  Patient presents with  . Leg Swelling     (Consider location/radiation/quality/duration/timing/severity/associated sxs/prior Treatment) HPI 80 year old female with recent wound to left lower extremity. She has noted some increased tenderness and redness around area over the past several days. She has not had any new injury. She has not any fever or chills. She's been taking by mouth without difficulty. She had this repaired at less than on hospital last week and had Steri-Strips placed. Is not noted any pus or discharge from the wound. Past Medical History  Diagnosis Date  . IBS (irritable bowel syndrome)   . Hypothyroid   . GERD (gastroesophageal reflux disease)   . Hypertension   . Allergic rhinitis   . Asthma   . Complication of anesthesia     confusion after 04/2012   Past Surgical History  Procedure Laterality Date  . Mastectomy      double  . Bladder surgery    . Nasal sinus surgery    . Hip arthroplasty Left 05/04/2012    Procedure: ARTHROPLASTY BIPOLAR HIP;  Surgeon: Mauri Pole, MD;  Location: [facility];  Service: Orthopedics;  Laterality: Left;  . Closed reduction wrist fracture Left 05/04/2012    Procedure: CLOSED REDUCTION WRIST;  Surgeon: Mauri Pole, MD;  Location: [facility];  Service: Orthopedics;  Laterality: Left;  with casting  . Open reduction internal fixation (orif) distal radial fracture Left 05/29/2012    Procedure: OPEN REDUCTION INTERNAL FIXATION (ORIF) DISTAL RADIAL FRACTURE;  Surgeon: Linna Hoff, MD;  Location: [facility];  Service: Orthopedics;  Laterality: Left;   Family History  Problem Relation Age of Onset  . CAD Mother   . Hypertension Father   . Breast cancer Sister    History  Substance Use Topics  . Smoking status: Never Smoker   . Smokeless tobacco: Never Used  . Alcohol Use: No   OB History   Grav  Para Term Preterm Abortions TAB SAB Ect Mult Living                 Review of Systems  All other systems reviewed and are negative.     Allergies  Cefuroxime axetil; Lactose intolerance (gi); Clarithromycin; Codeine; and Sulfonamide derivatives  Home Medications   Prior to Admission medications   Medication Sig Start Date End Date Taking? Authorizing Provider  albuterol (PROAIR HFA) 108 (90 BASE) MCG/ACT inhaler Inhale 2 puffs into the lungs every 6 (six) hours as needed for wheezing or shortness of breath.     Historical Provider, MD  aspirin 81 MG tablet Take 81 mg by mouth every other day.     Historical Provider, MD  b complex vitamins tablet Take 1 tablet by mouth every morning.    Historical Provider, MD  cholecalciferol (VITAMIN D) 1000 UNITS tablet Take 1,000 Units by mouth daily.    Historical Provider, MD  fish oil-omega-3 fatty acids 1000 MG capsule Take 2 g by mouth daily.    Historical Provider, MD  gabapentin (NEURONTIN) 300 MG capsule Take 2 capsules (600 mg  total) by mouth daily.    Elayne Snare, MD  levothyroxine (SYNTHROID) 125 MCG tablet Take 1/2 tablet daily DAW 03/31/13   Elayne Snare, MD  losartan (COZAAR) 50 MG tablet Take 1 tablet (50 mg total) by mouth every evening. 03/25/13  Elayne Snare, MD  metoprolol (LOPRESSOR) 50 MG tablet Take 1 tablet (50 mg total) by mouth every evening. 03/25/13   Elayne Snare, MD  Multiple Vitamin (MULTIVITAMIN WITH MINERALS) TABS Take 1 tablet by mouth every morning.    Historical Provider, MD  naphazoline-pheniramine (NAPHCON-A) 0.025-0.3 % ophthalmic solution Place 2 drops into both eyes 4 (four) times daily as needed for irritation or allergies.    Historical Provider, MD  nortriptyline (PAMELOR) 25 MG capsule Take 25 mg by mouth at bedtime as needed for sleep.  11/24/12   Elayne Snare, MD  omalizumab Arvid Right) 150 MG injection Inject 150 mg into the skin every 28 (twenty-eight) days. Administered by Dr. Gwenette Greet [phone]    Historical  Provider, MD  omeprazole (PRILOSEC) 20 MG capsule Take 1 capsule (20 mg total) by mouth every morning. 12/30/12   Elayne Snare, MD  oxazepam (SERAX) 10 MG capsule Take 10 mg by mouth at bedtime as needed for sleep or anxiety.    Historical Provider, MD  predniSONE (DELTASONE) 5 MG tablet Take 10 mg by mouth daily with breakfast. 21 day course. Starts with 6 and then tapers down daily by 1 tablet.    Historical Provider, MD  vitamin C (ASCORBIC ACID) 500 MG tablet Take 500 mg by mouth daily.    Historical Provider, MD  zoledronic acid (RECLAST) 5 MG/100ML SOLN injection Inject 100 mLs (5 mg total) into the vein once. 01/23/13   Elayne Snare, MD   BP 160/64  Pulse 78  Temp(Src) 98.3 F (36.8 C) (Oral)  Resp 22  SpO2 99% Physical Exam  Nursing note and vitals reviewed. Constitutional: She is oriented to person, place, and time. She appears well-developed and well-nourished.  HENT:  Head: Normocephalic and atraumatic.  Right Ear: External ear normal.  Left Ear: External ear normal.  Nose: Nose normal.  Mouth/Throat: Oropharynx is clear and moist.  Eyes: Conjunctivae and EOM are normal. Pupils are equal, round, and reactive to light.  Neck: Normal range of motion. Neck supple.  Cardiovascular: Normal rate, regular rhythm, normal heart sounds and intact distal pulses.   Pulmonary/Chest: Effort normal and breath sounds normal.  Abdominal: Soft. Bowel sounds are normal.  Musculoskeletal: Normal range of motion.       Legs: Neurological: She is alert and oriented to person, place, and time. She has normal reflexes.  Skin: Skin is warm and dry.  Psychiatric: She has a normal mood and affect. Her behavior is normal. Thought content normal.    ED Course  Procedures (including critical care time) Labs Review Labs Reviewed  CBC WITH DIFFERENTIAL - Abnormal; Notable for the following:    WBC 11.3 (*)    Neutro Abs 8.4 (*)    All other components within normal limits  COMPREHENSIVE METABOLIC  PANEL - Abnormal; Notable for the following:    Sodium 134 (*)    GFR calc non Af Amer 62 (*)    GFR calc Af Amer 72 (*)    All other components within normal limits    Imaging Review No results found.   EKG Interpretation None      MDM   Final diagnoses:  Cellulitis of left lower extremity    80 year old female with recent wound left lower extremity now with cellulitis. She is allergic to cefuroxime and also medications. She is given clindamycin here and is placed on clindamycin. I've given her strict return precautions and all of the instructions. She voices understanding that she should return if she  has increasing redness, fever, inability to keep down medications or is getting worse in anyway. Otherwise she should follow up with her primary care Dr. tomorrow.   Shaune Pollack, MD 08/17/13 1230

## 2013-08-19 ENCOUNTER — Ambulatory Visit (INDEPENDENT_AMBULATORY_CARE_PROVIDER_SITE_OTHER): Payer: Medicare Other | Admitting: Endocrinology

## 2013-08-19 ENCOUNTER — Encounter: Payer: Self-pay | Admitting: Endocrinology

## 2013-08-19 VITALS — BP 145/56 | HR 71 | Temp 98.5°F | Resp 16 | Ht 67.0 in | Wt 130.6 lb

## 2013-08-19 DIAGNOSIS — L02419 Cutaneous abscess of limb, unspecified: Secondary | ICD-10-CM | POA: Diagnosis not present

## 2013-08-19 DIAGNOSIS — I10 Essential (primary) hypertension: Secondary | ICD-10-CM

## 2013-08-19 DIAGNOSIS — L03116 Cellulitis of left lower limb: Secondary | ICD-10-CM

## 2013-08-19 DIAGNOSIS — L03119 Cellulitis of unspecified part of limb: Secondary | ICD-10-CM

## 2013-08-19 NOTE — Progress Notes (Signed)
Patient ID: Joanna [name], female   DOB: 1928/04/23, 80 y.o.   MRN: [id number]  Chief complaint: Followup   History of Present Illness:  She is here for followup of cellulitis:  1. She injured her left shin area with a trash can and suffered a laceration. However subsequently discarded affected and she went to the emergency room on the night of 8/2. She has been started on clindamycin 1500 mg 4 times a day which she started on 8/3. She thinks the area of redness is improving. She still has some discomfort and has the local area bandaged. No side effects from the clindamycin  2. Mild hyponatremia: She has had asymptomatic hyponatremia at times.  sodium is back to normal  3. Has history of hypothyroidism, mild and TSH has been consistently normal on 62.5 mcg daily. No unusual fatigue or weight change.  Lab Results  Component Value Date   TSH 0.89 04/06/2013   4. History of idiopathic peripheral neuropathy. She is having some pains in her legs but not much numbness. She has been given later, not clear if she is taking this.   6. HYPERTENSION: She has had mild to moderate hypertension with usually good control and requiring small doses of medications. She on metoprolol and losartan. Blood pressure high normal today but she is having some pain and anxiety        Medication List       This list is accurate as of: 08/19/13  4:12 PM.  Always use your most recent med list.               aspirin 81 MG tablet  Take 81 mg by mouth every other day.     b complex vitamins tablet  Take 1 tablet by mouth every morning.     cholecalciferol 1000 UNITS tablet  Commonly known as:  VITAMIN D  Take 1,000 Units by mouth daily.     clindamycin 150 MG capsule  Commonly known as:  CLEOCIN  Take 2 capsules (300 mg total) by mouth 4 (four) times daily.     fish oil-omega-3 fatty acids 1000 MG capsule  Take 2 g by mouth daily.     gabapentin 300 MG capsule  Commonly known as:  NEURONTIN  Take  2 capsules (600 mg  total) by mouth daily.     losartan 50 MG tablet  Commonly known as:  COZAAR  Take 1 tablet (50 mg total) by mouth every evening.     metoprolol 50 MG tablet  Commonly known as:  LOPRESSOR  Take 1 tablet (50 mg total) by mouth every evening.     multivitamin with minerals Tabs tablet  Take 1 tablet by mouth every morning.     naphazoline-pheniramine 0.025-0.3 % ophthalmic solution  Commonly known as:  NAPHCON-A  Place 2 drops into both eyes 4 (four) times daily as needed for irritation or allergies.     nortriptyline 25 MG capsule  Commonly known as:  PAMELOR  Take 25 mg by mouth at bedtime as needed for sleep.     omalizumab 150 MG injection  Commonly known as:  XOLAIR  Inject 150 mg into the skin every 28 (twenty-eight) days. Administered by Dr. Gwenette Greet [phone]     omeprazole 20 MG capsule  Commonly known as:  PRILOSEC  Take 1 capsule (20 mg total) by mouth every morning.     oxazepam 10 MG capsule  Commonly known as:  SERAX  Take 10 mg by  mouth at bedtime as needed for sleep or anxiety.     PROAIR HFA 108 (90 BASE) MCG/ACT inhaler  Generic drug:  albuterol  Inhale 2 puffs into the lungs every 6 (six) hours as needed for wheezing or shortness of breath.     SYNTHROID 125 MCG tablet  Generic drug:  levothyroxine  Take 1/2 tablet daily DAW     vitamin C 500 MG tablet  Commonly known as:  ASCORBIC ACID  Take 500 mg by mouth daily.     zoledronic acid 5 MG/100ML Soln injection  Commonly known as:  RECLAST  Inject 100 mLs (5 mg total) into the vein once.        Allergies:  Allergies  Allergen Reactions  . Cefuroxime Axetil Hives    Not sure.. Patient can be allergic to it today but a year from now she may not be.  . Lactose Intolerance (Gi) Other (See Comments)    Gi upset  . Clarithromycin Palpitations  . Codeine Palpitations  . Sulfonamide Derivatives Palpitations    Past Medical History  Diagnosis Date  . IBS (irritable bowel  syndrome)   . Hypothyroid   . GERD (gastroesophageal reflux disease)   . Hypertension   . Allergic rhinitis   . Asthma   . Complication of anesthesia     confusion after 04/2012    Past Surgical History  Procedure Laterality Date  . Mastectomy      double  . Bladder surgery    . Nasal sinus surgery    . Hip arthroplasty Left 05/04/2012    Procedure: ARTHROPLASTY BIPOLAR HIP;  Surgeon: Mauri Pole, MD;  Location: [facility];  Service: Orthopedics;  Laterality: Left;  . Closed reduction wrist fracture Left 05/04/2012    Procedure: CLOSED REDUCTION WRIST;  Surgeon: Mauri Pole, MD;  Location: [facility];  Service: Orthopedics;  Laterality: Left;  with casting  . Open reduction internal fixation (orif) distal radial fracture Left 05/29/2012    Procedure: OPEN REDUCTION INTERNAL FIXATION (ORIF) DISTAL RADIAL FRACTURE;  Surgeon: Linna Hoff, MD;  Location: [facility];  Service: Orthopedics;  Laterality: Left;    Family History  Problem Relation Age of Onset  . CAD Mother   . Hypertension Father   . Breast cancer Sister     Social History:  reports that she has never smoked. She has never used smokeless tobacco. She reports that she does not drink alcohol or use illicit drugs.  Review of Systems -   History of osteoporosis/osteopenia. She had a bone density in 2014 which showed osteopenia with T score -2.3. She had refused to take Fosamax. She did get a Reclast infusion without any side effects in 01/2013 She is taking her calcium and vitamin D preparation as well as 1000 units of vitamin D 3. Vitamin D level is normal  Had  multiple fractures from fall in 2014  Has had COPD followed by pulmonologist, recently given steroids for persistent shortness of breath despite treatment in ER for infection. Not dyspneic now  No weight loss recently, appetite is fair. However did lose weight since March  Wt Readings from Last 3 Encounters:  08/19/13 130 lb 9.6 oz (59.24 kg)  06/24/13 129 lb 9.6  oz (58.786 kg)  04/09/13 138 lb 3.2 oz (62.687 kg)    She has a history of hypercholesterolemia and has been taking her simvastatin regularly with adequate control  Lab Results  Component Value Date   CHOL 178 11/05/2012  HDL 70.60 11/05/2012   LDLCALC 86 08/08/2012   LDLDIRECT 93.8 11/05/2012   TRIG 217.0* 11/05/2012   CHOLHDL 3 11/05/2012    History of ankle edema: Not taking Lasix now and has no edema  History of chronic insomnia. She has tried various hypnotics for this and lately has been taking oxazepam  Exam:  BP 145/56  Pulse 71  Temp(Src) 98.5 F (36.9 C)  Resp 16  Ht 5' 7" (1.702 m)  Wt 130 lb 9.6 oz (59.24 kg)  BMI 20.45 kg/m2  SpO2 99%  She has an area of redness and warmth on the left shin, laceration is covered by the bandage. The area of redness is less than the marked area from the emergency room and warmth is mild. No ankle edema  Assessment/Plan:   Cellulitis secondary to infected wound. She started the antibiotic 2 days ago and is showing improvement and will continue to monitor. We will have her followup in the wound clinic for treatment of the laceration She will call if she has any loose stools  Also need to followup in 6 weeks for general care and may need evaluation weight loss if it continues   LABS:  No visits with results within 3 Day(s) from this visit. Latest known visit with results is:  Admission on 08/16/2013, Discharged on 08/16/2013  Component Date Value Ref Range Status  . WBC 08/16/2013 11.3* 4.0 - 10.5 K/uL Final  . RBC 08/16/2013 4.17  3.87 - 5.11 MIL/uL Final  . Hemoglobin 08/16/2013 12.6  12.0 - 15.0 g/dL Final  . HCT 08/16/2013 37.4  36.0 - 46.0 % Final  . MCV 08/16/2013 89.7  78.0 - 100.0 fL Final  . MCH 08/16/2013 30.2  26.0 - 34.0 pg Final  . MCHC 08/16/2013 33.7  30.0 - 36.0 g/dL Final  . RDW 08/16/2013 13.4  11.5 - 15.5 % Final  . Platelets 08/16/2013 279  150 - 400 K/uL Final  . Neutrophils Relative % 08/16/2013  75  43 - 77 % Final  . Neutro Abs 08/16/2013 8.4* 1.7 - 7.7 K/uL Final  . Lymphocytes Relative 08/16/2013 16  12 - 46 % Final  . Lymphs Abs 08/16/2013 1.9  0.7 - 4.0 K/uL Final  . Monocytes Relative 08/16/2013 8  3 - 12 % Final  . Monocytes Absolute 08/16/2013 0.9  0.1 - 1.0 K/uL Final  . Eosinophils Relative 08/16/2013 1  0 - 5 % Final  . Eosinophils Absolute 08/16/2013 0.1  0.0 - 0.7 K/uL Final  . Basophils Relative 08/16/2013 0  0 - 1 % Final  . Basophils Absolute 08/16/2013 0.0  0.0 - 0.1 K/uL Final  . Sodium 08/16/2013 134* 137 - 147 mEq/L Final  . Potassium 08/16/2013 4.3  3.7 - 5.3 mEq/L Final  . Chloride 08/16/2013 96  96 - 112 mEq/L Final  . CO2 08/16/2013 27  19 - 32 mEq/L Final  . Glucose, Bld 08/16/2013 88  70 - 99 mg/dL Final  . BUN 08/16/2013 19  6 - 23 mg/dL Final  . Creatinine, Ser 08/16/2013 0.85  0.50 - 1.10 mg/dL Final  . Calcium 08/16/2013 9.1  8.4 - 10.5 mg/dL Final  . Total Protein 08/16/2013 6.4  6.0 - 8.3 g/dL Final  . Albumin 08/16/2013 3.5  3.5 - 5.2 g/dL Final  . AST 08/16/2013 22  0 - 37 U/L Final  . ALT 08/16/2013 15  0 - 35 U/L Final  . Alkaline Phosphatase 08/16/2013 46  39 - 117 U/L  Final  . Total Bilirubin 08/16/2013 0.4  0.3 - 1.2 mg/dL Final  . GFR calc non Af Amer 08/16/2013 62* >90 mL/min Final  . GFR calc Af Amer 08/16/2013 72* >90 mL/min Final   Comment: (NOTE)                          The eGFR has been calculated using the CKD EPI equation.                          This calculation has not been validated in all clinical situations.                          eGFR's persistently <90 mL/min signify possible Chronic Kidney                          Disease.  . Anion gap 08/16/2013 11  5 - 15 Final

## 2013-08-21 ENCOUNTER — Telehealth: Payer: Self-pay | Admitting: Endocrinology

## 2013-08-21 MED ORDER — CLINDAMYCIN HCL 300 MG PO CAPS: ORAL_CAPSULE | ORAL | Status: DC

## 2013-08-21 NOTE — Telephone Encounter (Signed)
rx sent

## 2013-08-21 NOTE — Telephone Encounter (Signed)
She should have enough antibiotics until 8/10, needs to discuss antibiotics with wound clinic

## 2013-08-21 NOTE — Telephone Encounter (Signed)
Please see below and advise.

## 2013-08-21 NOTE — Telephone Encounter (Signed)
Patient need to know the name of the wound center,and need to another round of the antibiotic.

## 2013-08-21 NOTE — Telephone Encounter (Signed)
Since she only got 150 mg capsules will change her to 300 mg 3 times a day and give her 5 days prescription today

## 2013-08-21 NOTE — Telephone Encounter (Signed)
Please call patients caregiver and let her know if Mrs. Ke is to take more antibiotics  [phone]  Thank You

## 2013-08-24 ENCOUNTER — Encounter (HOSPITAL_BASED_OUTPATIENT_CLINIC_OR_DEPARTMENT_OTHER): Payer: Medicare Other | Attending: Plastic Surgery

## 2013-08-24 ENCOUNTER — Telehealth: Payer: Self-pay | Admitting: *Deleted

## 2013-08-24 ENCOUNTER — Inpatient Hospital Stay (HOSPITAL_COMMUNITY)
Admission: EM | Admit: 2013-08-24 | Discharge: 2013-08-28 | DRG: 603 | Disposition: A | Discharge status: Home Health | Payer: Medicare Other | Attending: Internal Medicine | Admitting: Internal Medicine

## 2013-08-24 ENCOUNTER — Encounter (HOSPITAL_COMMUNITY): Payer: Self-pay | Admitting: Emergency Medicine

## 2013-08-24 DIAGNOSIS — J449 Chronic obstructive pulmonary disease, unspecified: Secondary | ICD-10-CM | POA: Diagnosis not present

## 2013-08-24 DIAGNOSIS — J4489 Other specified chronic obstructive pulmonary disease: Secondary | ICD-10-CM | POA: Diagnosis present

## 2013-08-24 DIAGNOSIS — L270 Generalized skin eruption due to drugs and medicaments taken internally: Secondary | ICD-10-CM | POA: Diagnosis present

## 2013-08-24 DIAGNOSIS — E039 Hypothyroidism, unspecified: Secondary | ICD-10-CM | POA: Diagnosis present

## 2013-08-24 DIAGNOSIS — J479 Bronchiectasis, uncomplicated: Secondary | ICD-10-CM

## 2013-08-24 DIAGNOSIS — K589 Irritable bowel syndrome without diarrhea: Secondary | ICD-10-CM

## 2013-08-24 DIAGNOSIS — G609 Hereditary and idiopathic neuropathy, unspecified: Secondary | ICD-10-CM

## 2013-08-24 DIAGNOSIS — M1710 Unilateral primary osteoarthritis, unspecified knee: Secondary | ICD-10-CM | POA: Diagnosis not present

## 2013-08-24 DIAGNOSIS — E876 Hypokalemia: Secondary | ICD-10-CM

## 2013-08-24 DIAGNOSIS — M25529 Pain in unspecified elbow: Secondary | ICD-10-CM

## 2013-08-24 DIAGNOSIS — J309 Allergic rhinitis, unspecified: Secondary | ICD-10-CM

## 2013-08-24 DIAGNOSIS — E7800 Pure hypercholesterolemia, unspecified: Secondary | ICD-10-CM

## 2013-08-24 DIAGNOSIS — R609 Edema, unspecified: Secondary | ICD-10-CM | POA: Insufficient documentation

## 2013-08-24 DIAGNOSIS — L03119 Cellulitis of unspecified part of limb: Principal | ICD-10-CM

## 2013-08-24 DIAGNOSIS — L02419 Cutaneous abscess of limb, unspecified: Secondary | ICD-10-CM | POA: Insufficient documentation

## 2013-08-24 DIAGNOSIS — Z9010 Acquired absence of unspecified breast and nipple: Secondary | ICD-10-CM

## 2013-08-24 DIAGNOSIS — J329 Chronic sinusitis, unspecified: Secondary | ICD-10-CM

## 2013-08-24 DIAGNOSIS — K219 Gastro-esophageal reflux disease without esophagitis: Secondary | ICD-10-CM | POA: Diagnosis present

## 2013-08-24 DIAGNOSIS — K5900 Constipation, unspecified: Secondary | ICD-10-CM | POA: Diagnosis present

## 2013-08-24 DIAGNOSIS — T368X5A Adverse effect of other systemic antibiotics, initial encounter: Secondary | ICD-10-CM | POA: Diagnosis present

## 2013-08-24 DIAGNOSIS — I10 Essential (primary) hypertension: Secondary | ICD-10-CM

## 2013-08-24 DIAGNOSIS — I519 Heart disease, unspecified: Secondary | ICD-10-CM | POA: Diagnosis present

## 2013-08-24 DIAGNOSIS — D509 Iron deficiency anemia, unspecified: Secondary | ICD-10-CM

## 2013-08-24 DIAGNOSIS — L03116 Cellulitis of left lower limb: Secondary | ICD-10-CM

## 2013-08-24 DIAGNOSIS — E860 Dehydration: Secondary | ICD-10-CM

## 2013-08-24 DIAGNOSIS — M810 Age-related osteoporosis without current pathological fracture: Secondary | ICD-10-CM

## 2013-08-24 DIAGNOSIS — J45909 Unspecified asthma, uncomplicated: Secondary | ICD-10-CM

## 2013-08-24 DIAGNOSIS — K5730 Diverticulosis of large intestine without perforation or abscess without bleeding: Secondary | ICD-10-CM

## 2013-08-24 DIAGNOSIS — IMO0002 Reserved for concepts with insufficient information to code with codable children: Secondary | ICD-10-CM

## 2013-08-24 DIAGNOSIS — E871 Hypo-osmolality and hyponatremia: Secondary | ICD-10-CM

## 2013-08-24 LAB — COMPREHENSIVE METABOLIC PANEL
ALT: 12 U/L (ref 0–35)
AST: 19 U/L (ref 0–37)
Albumin: 3.5 g/dL (ref 3.5–5.2)
Alkaline Phosphatase: 52 U/L (ref 39–117)
Anion gap: 12 (ref 5–15)
BUN: 31 mg/dL — ABNORMAL HIGH (ref 6–23)
CO2: 26 mEq/L (ref 19–32)
Calcium: 9.8 mg/dL (ref 8.4–10.5)
Chloride: 96 mEq/L (ref 96–112)
Creatinine, Ser: 0.99 mg/dL (ref 0.50–1.10)
GFR calc Af Amer: 60 mL/min — ABNORMAL LOW (ref >90)
GFR calc non Af Amer: 52 mL/min — ABNORMAL LOW (ref >90)
Glucose, Bld: 114 mg/dL — ABNORMAL HIGH (ref 70–99)
Potassium: 4.4 mEq/L (ref 3.7–5.3)
Sodium: 134 mEq/L — ABNORMAL LOW (ref 137–147)
Total Bilirubin: 0.3 mg/dL (ref 0.3–1.2)
Total Protein: 7.1 g/dL (ref 6.0–8.3)

## 2013-08-24 LAB — CBC WITH DIFFERENTIAL/PLATELET
Basophils Absolute: 0 10*3/uL (ref 0.0–0.1)
Basophils Relative: 0 % (ref 0–1)
Eosinophils Absolute: 0.1 10*3/uL (ref 0.0–0.7)
Eosinophils Relative: 1 % (ref 0–5)
HCT: 37.1 % (ref 36.0–46.0)
Hemoglobin: 12.6 g/dL (ref 12.0–15.0)
Lymphocytes Relative: 20 % (ref 12–46)
Lymphs Abs: 1.9 10*3/uL (ref 0.7–4.0)
MCH: 30.1 pg (ref 26.0–34.0)
MCHC: 34 g/dL (ref 30.0–36.0)
MCV: 88.8 fL (ref 78.0–100.0)
Monocytes Absolute: 0.8 10*3/uL (ref 0.1–1.0)
Monocytes Relative: 9 % (ref 3–12)
Neutro Abs: 6.7 10*3/uL (ref 1.7–7.7)
Neutrophils Relative %: 70 % (ref 43–77)
Platelets: 263 10*3/uL (ref 150–400)
RBC: 4.18 MIL/uL (ref 3.87–5.11)
RDW: 13.4 % (ref 11.5–15.5)
WBC: 9.5 10*3/uL (ref 4.0–10.5)

## 2013-08-24 MED ORDER — DOCUSATE SODIUM 100 MG PO CAPS
100.0000 mg | ORAL_CAPSULE | Freq: Two times a day (BID) | ORAL | Status: DC
  Administered 2013-08-24 – 2013-08-28 (×8): 100 mg via ORAL
  Filled 2013-08-24 (×9): qty 1

## 2013-08-24 MED ORDER — OMEGA-3-ACID ETHYL ESTERS 1 G PO CAPS
2.0000 g | ORAL_CAPSULE | Freq: Every day | ORAL | Status: DC
  Administered 2013-08-24 – 2013-08-28 (×5): 2 g via ORAL
  Filled 2013-08-24 (×5): qty 2

## 2013-08-24 MED ORDER — ENOXAPARIN SODIUM 40 MG/0.4ML ~~LOC~~ SOLN
40.0000 mg | Freq: Every day | SUBCUTANEOUS | Status: DC
  Administered 2013-08-24 – 2013-08-27 (×4): 40 mg via SUBCUTANEOUS
  Filled 2013-08-24 (×5): qty 0.4

## 2013-08-24 MED ORDER — NORTRIPTYLINE HCL 25 MG PO CAPS
25.0000 mg | ORAL_CAPSULE | Freq: Every evening | ORAL | Status: DC | PRN
  Filled 2013-08-24: qty 1

## 2013-08-24 MED ORDER — DIPHENHYDRAMINE HCL 25 MG PO CAPS
25.0000 mg | ORAL_CAPSULE | Freq: Four times a day (QID) | ORAL | Status: DC | PRN
  Administered 2013-08-25: 25 mg via ORAL
  Filled 2013-08-24: qty 1

## 2013-08-24 MED ORDER — GABAPENTIN 300 MG PO CAPS
300.0000 mg | ORAL_CAPSULE | Freq: Every day | ORAL | Status: DC
  Administered 2013-08-24 – 2013-08-27 (×4): 300 mg via ORAL
  Filled 2013-08-24 (×5): qty 1

## 2013-08-24 MED ORDER — ONDANSETRON HCL 4 MG/2ML IJ SOLN
4.0000 mg | Freq: Once | INTRAMUSCULAR | Status: AC
  Administered 2013-08-24: 4 mg via INTRAVENOUS
  Filled 2013-08-24: qty 2

## 2013-08-24 MED ORDER — SODIUM CHLORIDE 0.9 % IV SOLN
INTRAVENOUS | Status: DC
  Administered 2013-08-24 – 2013-08-27 (×4): via INTRAVENOUS

## 2013-08-24 MED ORDER — ONDANSETRON HCL 4 MG PO TABS: 4.0000 mg | ORAL_TABLET | Freq: Four times a day (QID) | ORAL | Status: DC | PRN

## 2013-08-24 MED ORDER — LORAZEPAM 1 MG PO TABS
1.0000 mg | ORAL_TABLET | Freq: Every day | ORAL | Status: DC
  Administered 2013-08-24 – 2013-08-27 (×4): 1 mg via ORAL
  Filled 2013-08-24 (×4): qty 1

## 2013-08-24 MED ORDER — LOSARTAN POTASSIUM 50 MG PO TABS
50.0000 mg | ORAL_TABLET | Freq: Every evening | ORAL | Status: DC
  Administered 2013-08-25 – 2013-08-27 (×3): 50 mg via ORAL
  Filled 2013-08-24 (×4): qty 1

## 2013-08-24 MED ORDER — LEVOTHYROXINE SODIUM 75 MCG PO TABS
75.0000 ug | ORAL_TABLET | Freq: Every day | ORAL | Status: DC
  Administered 2013-08-25 – 2013-08-28 (×4): 75 ug via ORAL
  Filled 2013-08-24 (×5): qty 1

## 2013-08-24 MED ORDER — HYDROMORPHONE HCL PF 1 MG/ML IJ SOLN
1.0000 mg | Freq: Once | INTRAMUSCULAR | Status: AC
  Administered 2013-08-24: 1 mg via INTRAVENOUS
  Filled 2013-08-24: qty 1

## 2013-08-24 MED ORDER — BISACODYL 10 MG RE SUPP
10.0000 mg | Freq: Every day | RECTAL | Status: DC | PRN
Start: 2013-08-24 — End: 2013-08-28
  Administered 2013-08-25: 10 mg via RECTAL
  Filled 2013-08-24 (×2): qty 1

## 2013-08-24 MED ORDER — VITAMIN D3 25 MCG (1000 UNIT) PO TABS
1000.0000 [IU] | ORAL_TABLET | Freq: Every day | ORAL | Status: DC
  Administered 2013-08-25 – 2013-08-28 (×4): 1000 [IU] via ORAL
  Filled 2013-08-24 (×4): qty 1

## 2013-08-24 MED ORDER — B COMPLEX-C PO TABS
1.0000 | ORAL_TABLET | Freq: Every morning | ORAL | Status: DC
  Administered 2013-08-25 – 2013-08-28 (×4): 1 via ORAL
  Filled 2013-08-24 (×4): qty 1

## 2013-08-24 MED ORDER — ALBUTEROL SULFATE (2.5 MG/3ML) 0.083% IN NEBU
3.0000 mL | INHALATION_SOLUTION | Freq: Four times a day (QID) | RESPIRATORY_TRACT | Status: DC | PRN
  Administered 2013-08-24: 3 mL via RESPIRATORY_TRACT
  Filled 2013-08-24: qty 3

## 2013-08-24 MED ORDER — VITAMIN C 500 MG PO TABS: 500.0000 mg | ORAL_TABLET | Freq: Every day | ORAL | Status: DC

## 2013-08-24 MED ORDER — VANCOMYCIN HCL 10 G IV SOLR
1250.0000 mg | Freq: Once | INTRAVENOUS | Status: AC
  Administered 2013-08-24: 1250 mg via INTRAVENOUS
  Filled 2013-08-24: qty 1250

## 2013-08-24 MED ORDER — METOPROLOL TARTRATE 50 MG PO TABS
50.0000 mg | ORAL_TABLET | Freq: Every evening | ORAL | Status: DC
  Administered 2013-08-24 – 2013-08-27 (×4): 50 mg via ORAL
  Filled 2013-08-24 (×5): qty 1

## 2013-08-24 MED ORDER — ONDANSETRON HCL 4 MG/2ML IJ SOLN: 4.0000 mg | Freq: Four times a day (QID) | INTRAMUSCULAR | Status: DC | PRN

## 2013-08-24 MED ORDER — HYDROMORPHONE HCL PF 1 MG/ML IJ SOLN
1.0000 mg | INTRAMUSCULAR | Status: DC | PRN
  Administered 2013-08-26: 1 mg via INTRAVENOUS
  Filled 2013-08-24: qty 1

## 2013-08-24 MED ORDER — ADULT MULTIVITAMIN W/MINERALS CH
1.0000 | ORAL_TABLET | Freq: Every morning | ORAL | Status: DC
  Administered 2013-08-25 – 2013-08-28 (×4): 1 via ORAL
  Filled 2013-08-24 (×4): qty 1

## 2013-08-24 MED ORDER — NAPHAZOLINE-PHENIRAMINE 0.025-0.3 % OP SOLN
2.0000 [drp] | Freq: Four times a day (QID) | OPHTHALMIC | Status: DC | PRN
  Filled 2013-08-24: qty 15

## 2013-08-24 NOTE — ED Notes (Signed)
Pt had injury to left leg at end of July.  Has been on antibiotics with no success.  Pt was told today from wound center that pt needs IV antibiotics.  Pt to have antibiotics and decrease infection in order to have surgery.

## 2013-08-24 NOTE — Telephone Encounter (Signed)
Is she getting the antibiotic in the ER or short stay? Did not understand the question or what we need to do

## 2013-08-24 NOTE — H&P (Signed)
Joanna [name] is an 80 y.o. female.   Chief Complaint: Worsening pain, erythema, and swelling of cellulitis of left lower extremities. HPI: Pt is a 80 yr old woman who injured her left lower extremity on a garbage can about 2 weeks ago. She was seen by urgent care on 8/3 and was given a prescription for Clindamycin. She followed up on the 7th and the leg appeared to be improving. However the patient states that in the past 2 days the leg has increased pain, erythema, and warmth. She has completed 7 days of clindamycin as outpatient. In the last 2-3 days she has also developed a rash on her back.  Past Medical History  Diagnosis Date  . IBS (irritable bowel syndrome)   . Hypothyroid   . GERD (gastroesophageal reflux disease)   . Hypertension   . Allergic rhinitis   . Asthma   . Complication of anesthesia     confusion after 04/2012    Past Surgical History  Procedure Laterality Date  . Mastectomy      double  . Bladder surgery    . Nasal sinus surgery    . Hip arthroplasty Left 05/04/2012    Procedure: ARTHROPLASTY BIPOLAR HIP;  Surgeon: Mauri Pole, MD;  Location: [facility];  Service: Orthopedics;  Laterality: Left;  . Closed reduction wrist fracture Left 05/04/2012    Procedure: CLOSED REDUCTION WRIST;  Surgeon: Mauri Pole, MD;  Location: [facility];  Service: Orthopedics;  Laterality: Left;  with casting  . Open reduction internal fixation (orif) distal radial fracture Left 05/29/2012    Procedure: OPEN REDUCTION INTERNAL FIXATION (ORIF) DISTAL RADIAL FRACTURE;  Surgeon: Linna Hoff, MD;  Location: [place];  Service: Orthopedics;  Laterality: Left;    Family History  Problem Relation Age of Onset  . CAD Mother   . Hypertension Father   . Breast cancer Sister    Social History:  reports that she has never smoked. She has never used smokeless tobacco. She reports that she does not drink alcohol or use illicit drugs.  Allergies:  Allergies  Allergen Reactions  . Cefuroxime  Axetil Hives    Not sure.. Patient can be allergic to it today but a year from now she may not be.  . Lactose Intolerance (Gi) Other (See Comments)    Gi upset  . Clarithromycin Palpitations  . Codeine Palpitations  . Sulfonamide Derivatives Palpitations    Medications Prior to Admission  Medication Sig Dispense Refill  . b complex vitamins tablet Take 1 tablet by mouth every morning.      . cholecalciferol (VITAMIN D) 1000 UNITS tablet Take 1,000 Units by mouth daily.      . clindamycin (CLEOCIN) 300 MG capsule Take 1 capsule 3 times a day for 5 days  15 capsule  0  . fish oil-omega-3 fatty acids 1000 MG capsule Take 2 g by mouth daily.      Marland Kitchen gabapentin (NEURONTIN) 300 MG capsule Take 2 capsules (600 mg  total) by mouth daily.  180 capsule  1  . levothyroxine (SYNTHROID) 125 MCG tablet Take 75 mcg by mouth daily before breakfast. Take 1/2 tablet daily DAW      . losartan (COZAAR) 50 MG tablet Take 1 tablet (50 mg total) by mouth every evening.  90 tablet  1  . metoprolol (LOPRESSOR) 50 MG tablet Take 1 tablet (50 mg total) by mouth every evening.  90 tablet  1  . Multiple Vitamin (MULTIVITAMIN WITH MINERALS)  TABS Take 1 tablet by mouth every morning.      . naphazoline-pheniramine (NAPHCON-A) 0.025-0.3 % ophthalmic solution Place 2 drops into both eyes 4 (four) times daily as needed for irritation or allergies.      Marland Kitchen nortriptyline (PAMELOR) 25 MG capsule Take 25 mg by mouth at bedtime as needed for sleep.       Marland Kitchen oxazepam (SERAX) 10 MG capsule Take 10 mg by mouth at bedtime as needed for sleep or anxiety.      . vitamin C (ASCORBIC ACID) 500 MG tablet Take 500 mg by mouth daily.      Marland Kitchen albuterol (PROAIR HFA) 108 (90 BASE) MCG/ACT inhaler Inhale 2 puffs into the lungs every 6 (six) hours as needed for wheezing or shortness of breath.       . omalizumab (XOLAIR) 150 MG injection Inject 150 mg into the skin every 28 (twenty-eight) days. Administered by Dr. Gwenette Greet [phone]      .  zoledronic acid (RECLAST) 5 MG/100ML SOLN injection Inject 100 mLs (5 mg total) into the vein once.  100 mL  0    Results for orders placed during the hospital encounter of 08/24/13 (from the past 48 hour(s))  CBC WITH DIFFERENTIAL     Status: None   Collection Time    08/24/13  7:13 PM      Result Value Ref Range   WBC 9.5  4.0 - 10.5 K/uL   RBC 4.18  3.87 - 5.11 MIL/uL   Hemoglobin 12.6  12.0 - 15.0 g/dL   HCT 37.1  36.0 - 46.0 %   MCV 88.8  78.0 - 100.0 fL   MCH 30.1  26.0 - 34.0 pg   MCHC 34.0  30.0 - 36.0 g/dL   RDW 13.4  11.5 - 15.5 %   Platelets 263  150 - 400 K/uL   Neutrophils Relative % 70  43 - 77 %   Neutro Abs 6.7  1.7 - 7.7 K/uL   Lymphocytes Relative 20  12 - 46 %   Lymphs Abs 1.9  0.7 - 4.0 K/uL   Monocytes Relative 9  3 - 12 %   Monocytes Absolute 0.8  0.1 - 1.0 K/uL   Eosinophils Relative 1  0 - 5 %   Eosinophils Absolute 0.1  0.0 - 0.7 K/uL   Basophils Relative 0  0 - 1 %   Basophils Absolute 0.0  0.0 - 0.1 K/uL  COMPREHENSIVE METABOLIC PANEL     Status: Abnormal   Collection Time    08/24/13  7:13 PM      Result Value Ref Range   Sodium 134 (*) 137 - 147 mEq/L   Potassium 4.4  3.7 - 5.3 mEq/L   Chloride 96  96 - 112 mEq/L   CO2 26  19 - 32 mEq/L   Glucose, Bld 114 (*) 70 - 99 mg/dL   BUN 31 (*) 6 - 23 mg/dL   Creatinine, Ser 0.99  0.50 - 1.10 mg/dL   Calcium 9.8  8.4 - 10.5 mg/dL   Total Protein 7.1  6.0 - 8.3 g/dL   Albumin 3.5  3.5 - 5.2 g/dL   AST 19  0 - 37 U/L   ALT 12  0 - 35 U/L   Alkaline Phosphatase 52  39 - 117 U/L   Total Bilirubin 0.3  0.3 - 1.2 mg/dL   GFR calc non Af Amer 52 (*) >90 mL/min   GFR calc Af Amer 60 (*) >  90 mL/min   Comment: (NOTE)     The eGFR has been calculated using the CKD EPI equation.     This calculation has not been validated in all clinical situations.     eGFR's persistently <90 mL/min signify possible Chronic Kidney     Disease.   Anion gap 12  5 - 15   No results found.  Review of Systems   Constitutional: Negative for fever and chills.  HENT: Positive for hearing loss. Negative for congestion.   Eyes: Negative for blurred vision, double vision, pain and redness.  Respiratory: Positive for cough. Negative for hemoptysis, sputum production, shortness of breath, wheezing and stridor.        Cough since last nigh. Nonproductive.  Cardiovascular: Positive for leg swelling. Negative for chest pain, palpitations, orthopnea and PND.  Gastrointestinal: Positive for heartburn and constipation. Negative for nausea, vomiting, abdominal pain, diarrhea, blood in stool and melena.  Genitourinary: Negative for dysuria, urgency and frequency.  Musculoskeletal: Negative for falls, joint pain and myalgias.  Skin: Positive for itching and rash.       Pt has an erythematous rash on her upper back that is pruritic. She states that the rashhas beenthere for the past 2-3 days.  She also has a patch erythema and induratin to the left lower extremity just proximal to the left ankle. It is warm to the touch and tender. There is swelling to the left ankle distal to area of erythema.  Neurological: Negative for dizziness, tingling, focal weakness, seizures, weakness and headaches.  Endo/Heme/Allergies: Negative for environmental allergies. Does not bruise/bleed easily.  Psychiatric/Behavioral: Negative for depression and hallucinations. The patient is not nervous/anxious.     Blood pressure 149/52, pulse 80, temperature 98.3 F (36.8 C), temperature source Oral, resp. rate 18, height 5' 7" (1.702 m), weight 60.3 kg (132 lb 15 oz), SpO2 99.00%. Physical Exam  Constitutional: She is oriented to person, place, and time. She appears well-developed and well-nourished. No distress.  HENT:  Head: Normocephalic and atraumatic.  Mouth/Throat: No oropharyngeal exudate.  Eyes: EOM are normal. Pupils are equal, round, and reactive to light. No scleral icterus.  Neck: No JVD present. No thyromegaly present.   Cardiovascular: Normal rate, regular rhythm and normal heart sounds.  Exam reveals no gallop and no friction rub.   No murmur heard. Respiratory: No stridor. No respiratory distress. She has no wheezes. She has no rales. She exhibits no tenderness.  GI: She exhibits no distension and no mass. There is no tenderness. There is no rebound and no guarding.  Musculoskeletal: She exhibits edema. She exhibits no tenderness.  Edema of left ankle.  Lymphadenopathy:    She has no cervical adenopathy.  Neurological: She is alert and oriented to person, place, and time. She displays normal reflexes. No cranial nerve deficit. She exhibits abnormal muscle tone. Coordination normal.  Skin: Skin is warm and dry. Rash noted. She is not diaphoretic. There is erythema. No pallor.  Erythema and warmth to left lower extremity proximal to left ankle.  Also erythematous rash to her upper back. Pruritic.  Psychiatric: She has a normal mood and affect. Her behavior is normal. Judgment and thought content normal.     Assessment/Plan 1. Cellulitis - Pt has failed outpatient treatment with Clindamycin. I will put her on Vancomycin to be dosed by pharmacy. 2. Rash - Possibly due to Clindamycin. This has been stopped.  She will have benadryl for itching. 3. Dehydration - IV fluids 4. Constipation - Docusate Na  and Bisacodyl suppository 5. Hypertension - continue home medications 6. Hyperlipidemia - continue home medications 7. GERD - protonix  Olar Santini 08/24/2013, 9:44 PM

## 2013-08-24 NOTE — ED Notes (Signed)
Pt has been taking clindamycin for one week. Patient has redness, warmth and some edema to left lower leg. Good pedal pulse. No hx of diabetes.  She reports that she injured her leg the end of July by hitting it on the garbage can.

## 2013-08-24 NOTE — Telephone Encounter (Signed)
Patients daughter in law called, the wound center told her they could not do anything for her and sent her over to the ER at [facility] long, they said she needs I.V antibiotics.  Daughter in law was concerned because they told her she would have to have it daily.

## 2013-08-25 ENCOUNTER — Ambulatory Visit (HOSPITAL_COMMUNITY): Payer: Medicare Other

## 2013-08-25 ENCOUNTER — Inpatient Hospital Stay (HOSPITAL_COMMUNITY): Payer: Medicare Other

## 2013-08-25 ENCOUNTER — Encounter (HOSPITAL_COMMUNITY): Payer: Self-pay | Admitting: Radiology

## 2013-08-25 DIAGNOSIS — J479 Bronchiectasis, uncomplicated: Secondary | ICD-10-CM

## 2013-08-25 DIAGNOSIS — J449 Chronic obstructive pulmonary disease, unspecified: Secondary | ICD-10-CM

## 2013-08-25 DIAGNOSIS — K219 Gastro-esophageal reflux disease without esophagitis: Secondary | ICD-10-CM

## 2013-08-25 LAB — BASIC METABOLIC PANEL
Anion gap: 9 (ref 5–15)
BUN: 25 mg/dL — AB (ref 6–23)
CALCIUM: 8.8 mg/dL (ref 8.4–10.5)
CO2: 25 mEq/L (ref 19–32)
Chloride: 100 mEq/L (ref 96–112)
Creatinine, Ser: 0.83 mg/dL (ref 0.50–1.10)
GFR, EST AFRICAN AMERICAN: 74 mL/min — AB (ref >90)
GFR, EST NON AFRICAN AMERICAN: 64 mL/min — AB (ref >90)
Glucose, Bld: 88 mg/dL (ref 70–99)
POTASSIUM: 4.4 meq/L (ref 3.7–5.3)
SODIUM: 134 meq/L — AB (ref 137–147)

## 2013-08-25 LAB — CBC
HCT: 34.6 % — ABNORMAL LOW (ref 36.0–46.0)
Hemoglobin: 11.5 g/dL — ABNORMAL LOW (ref 12.0–15.0)
MCH: 29.8 pg (ref 26.0–34.0)
MCHC: 33.2 g/dL (ref 30.0–36.0)
MCV: 89.6 fL (ref 78.0–100.0)
PLATELETS: 218 10*3/uL (ref 150–400)
RBC: 3.86 MIL/uL — ABNORMAL LOW (ref 3.87–5.11)
RDW: 13.4 % (ref 11.5–15.5)
WBC: 7.5 10*3/uL (ref 4.0–10.5)

## 2013-08-25 MED ORDER — CETYLPYRIDINIUM CHLORIDE 0.05 % MT LIQD
7.0000 mL | Freq: Two times a day (BID) | OROMUCOSAL | Status: DC
  Administered 2013-08-26 – 2013-08-28 (×3): 7 mL via OROMUCOSAL

## 2013-08-25 MED ORDER — SODIUM CHLORIDE 0.9 % IV SOLN
1250.0000 mg | Freq: Once | INTRAVENOUS | Status: AC
  Administered 2013-08-25: 1250 mg via INTRAVENOUS
  Filled 2013-08-25: qty 1250

## 2013-08-25 MED ORDER — VANCOMYCIN HCL 10 G IV SOLR
1250.0000 mg | INTRAVENOUS | Status: DC
  Administered 2013-08-26 – 2013-08-28 (×3): 1250 mg via INTRAVENOUS
  Filled 2013-08-25 (×3): qty 1250

## 2013-08-25 MED ORDER — IOHEXOL 300 MG/ML  SOLN
100.0000 mL | Freq: Once | INTRAMUSCULAR | Status: AC | PRN
  Administered 2013-08-25: 100 mL via INTRAVENOUS

## 2013-08-25 NOTE — Telephone Encounter (Signed)
Daughter in law did not say.

## 2013-08-25 NOTE — Telephone Encounter (Signed)
The IV antibiotics will be dosed by the pharmacy guidelines and may be every other day. Is the wound clinic see her in followup?

## 2013-08-25 NOTE — Progress Notes (Signed)
ANTIBIOTIC CONSULT NOTE - INITIAL  Pharmacy Consult for vancomycin Indication: cellulitis  Allergies  Allergen Reactions  . Cefuroxime Axetil Hives    Not sure.. Patient can be allergic to it today but a year from now she may not be.  . Lactose Intolerance (Gi) Other (See Comments)    Gi upset  . Clarithromycin Palpitations  . Codeine Palpitations  . Sulfonamide Derivatives Palpitations    Patient Measurements: Height: 5\' 7"  (170.2 cm) Weight: 132 lb 15 oz (60.3 kg) IBW/kg (Calculated) : 61.6  Vital Signs: Temp: 97.5 F (36.4 C) (08/11 0520) Temp src: Oral (08/11 0520) BP: 136/58 mmHg (08/11 0520) Pulse Rate: 60 (08/11 0520) Intake/Output from previous day: 08/10 0701 - 08/11 0700 In: 466.3 [I.V.:466.3] Out: 600 [Urine:600] Intake/Output from this shift:    Labs:  Recent Labs  08/24/13 1913 08/25/13 0418  WBC 9.5 7.5  HGB 12.6 11.5*  PLT 263 218  CREATININE 0.99 0.83   Estimated Creatinine Clearance: 49.7 ml/min (by C-G formula based on Cr of 0.83).   Medical History: Past Medical History  Diagnosis Date  . IBS (irritable bowel syndrome)   . Hypothyroid   . GERD (gastroesophageal reflux disease)   . Hypertension   . Allergic rhinitis   . Asthma   . Complication of anesthesia     confusion after 04/2012    Medications:  Scheduled:  . [START ON 08/26/2013] antiseptic oral rinse  7 mL Mouth Rinse BID  . B-complex with vitamin C  1 tablet Oral q morning - 10a  . cholecalciferol  1,000 Units Oral Daily  . docusate sodium  100 mg Oral BID  . enoxaparin (LOVENOX) injection  40 mg Subcutaneous QHS  . gabapentin  300 mg Oral QHS  . levothyroxine  75 mcg Oral QAC breakfast  . LORazepam  1 mg Oral QHS  . losartan  50 mg Oral QPM  . metoprolol  50 mg Oral QPM  . multivitamin with minerals  1 tablet Oral q morning - 10a  . omega-3 acid ethyl esters  2 g Oral Daily   Infusions:  . sodium chloride 75 mL/hr at 08/24/13 2247   Assessment: 37 yoF admitted  8/10PM with worsening cellulitis of LLE. Pt states she injured herself on garbage can 2 weeks ago and was evaluated at an Urgent Care and prescribed clindamycin. She has completed a 7 day course of clindamycin. Wound was improving at follow up on 8/7, however had then worsened 2-3 days PTA. Pt also states she has developed a rash on her back. Pharmacy has been consulted to dose vancomycin for cellulitis  8/11 >> vancomycin >>   Tmax: afebrile WBCs: WNL Renal: SCr 0.83 (at baseline), CrCl 49 ml/min CG/N  No microbiologic data available for this admission  Goal of Therapy:  Vancomycin trough level 10-15 mcg/ml  Plan:  - vancomycin 1250mg  IV q24h - vancomycin trough at steady state if indicated - follow-up clinical course, renal function - follow-up antibiotic de-escalation and length of therapy  Thank you for the consult.  Currie Paris, PharmD, BCPS Pager: [phone] Pharmacy: [phone] 08/25/2013 8:14 AM

## 2013-08-25 NOTE — Progress Notes (Signed)
Patient ID: Joanna [name], female   DOB: 1929-01-09, 80 y.o.   MRN: [id number] [facility] PROGRESS NOTE  STEPHANEY STEVEN MOQ:[id number] DOB: 17-Apr-1928 DOA: 08/24/2013 PCP: Elayne Snare, MD  Brief narrative: 80 y.o. female. With past medical history of COPD, hypertension, hypothyroidism, dyslipidemia who presented to [facility] ED 08/24/2013 with worsening swelling, redness and tenderness over left lower extremities. She reports this problem to be for past 2 weeks. She was seen in urgent care on 08/17/2013 and was prescribed clindamycin. Initially, redness and swelling seemed to be improving but over past 2 days prior to this admission there was an increase in tenderness and redness.  Assessment/Plan:    Principal problem: Left lower extremity cellulitis  Noted with central area of eschar. Obtain x ray for further eval and may need CT scan as well.   Start vancomycin  Supportive care with analgesia as needed Active Problems:   HYPOTHYROIDISM  Continue Synthroid   HYPERTENSION  Continue losartan and metoprolol   Chronic obstructive asthma, unspecified  Respiratory status is stable at this time.   Pure hypercholesterolemia  Continue omega-3 supplementation.   DVT Prophylaxis  Lovenox subQ while pt is in hospital   Code Status: Full.  Family Communication:  plan of care discussed with the patient Disposition Plan: Home when stable.    IV Access:   Peripheral IV Procedures and diagnostic studies:   No results found. Medical Consultants:   None  Other Consultants:   None  Anti-Infectives:   Vancomycin 08/25/2013 -->   Leisa Lenz, MD  [facility] Pager [phone]  If 7PM-7AM, please contact night-coverage [email or web address] Password [facility] 08/25/2013, 7:56 AM   LOS: 1 day    HPI/Subjective: No acute overnight events.  Objective: Filed Vitals:   08/24/13 1821 08/24/13 2052 08/24/13 2134 08/25/13 0520  BP: 144/59 152/59 149/52 136/58  Pulse: 84 77 80 60  Temp:  98.1 F (36.7 C)  98.3 F (36.8 C) 97.5 F (36.4 C)  TempSrc: Oral  Oral Oral  Resp: 20 18 18 16   Height:   5\' 7"  (1.702 m)   Weight:   60.3 kg (132 lb 15 oz)   SpO2: 97% 97% 99% 98%    Intake/Output Summary (Last 24 hours) at 08/25/13 0756 Last data filed at 08/25/13 0700  Gross per 24 hour  Intake 466.25 ml  Output    600 ml  Net -133.75 ml    Exam:   General:  Pt is alert, follows commands appropriately, not in acute distress  Cardiovascular: Regular rate and rhythm, S1/S2, no murmurs  Respiratory: Clear to auscultation bilaterally, no wheezing, no crackles, no rhonchi  Abdomen: Soft, non tender, non distended, bowel sounds present  Extremities: LLE cellulitis with central area of eschar but no drainage appreciated, pulses DP and PT palpable bilaterally  Neuro: Grossly nonfocal  Data Reviewed: Basic Metabolic Panel:  Recent Labs Lab 08/24/13 1913 08/25/13 0418  NA 134* 134*  K 4.4 4.4  CL 96 100  CO2 26 25  GLUCOSE 114* 88  BUN 31* 25*  CREATININE 0.99 0.83  CALCIUM 9.8 8.8   Liver Function Tests:  Recent Labs Lab 08/24/13 1913  AST 19  ALT 12  ALKPHOS 52  BILITOT 0.3  PROT 7.1  ALBUMIN 3.5   No results found for this basename: LIPASE, AMYLASE,  in the last 168 hours No results found for this basename: AMMONIA,  in the last 168 hours CBC:  Recent Labs Lab 08/24/13 1913 08/25/13 0418  WBC 9.5 7.5  NEUTROABS 6.7  --   HGB 12.6 11.5*  HCT 37.1 34.6*  MCV 88.8 89.6  PLT 263 218   Cardiac Enzymes: No results found for this basename: CKTOTAL, CKMB, CKMBINDEX, TROPONINI,  in the last 168 hours BNP: No components found with this basename: POCBNP,  CBG: No results found for this basename: GLUCAP,  in the last 168 hours  No results found for this or any previous visit (from the past 240 hour(s)).   Scheduled Meds: . docusate sodium  100 mg Oral BID  . enoxaparin (LOVENOX) i  40 mg Subcutaneous QHS  . gabapentin  300 mg Oral QHS  .  levothyroxine  75 mcg Oral QAC breakfast  . LORazepam  1 mg Oral QHS  . losartan  50 mg Oral QPM  . metoprolol  50 mg Oral QPM  . multivitamin  1 tablet Oral q morning - 10a  . omega-3 acid ethyl esters  2 g Oral Daily   Continuous Infusions: . sodium chloride 75 mL/hr at 08/24/13 2247

## 2013-08-25 NOTE — Telephone Encounter (Signed)
She was going to the ER at [facility], the wound center sent her there.  Patient's daughter in law just wanted to make you aware and wanted to know if there was a normal procedure or not?

## 2013-08-26 NOTE — Progress Notes (Signed)
Patient ID: Joanna [name], female   DOB: 06/12/1928, 80 y.o.   MRN: [id number] [facility] PROGRESS NOTE  Joanna [name] MEQ:[id number] DOB: May 09, 1928 DOA: 08/24/2013 PCP: Elayne Snare, MD  Brief narrative: 80 y.o. female. With past medical history of COPD, hypertension, hypothyroidism, dyslipidemia who presented to [facility] ED 08/24/2013 with worsening swelling, redness and tenderness over left lower extremities. She reports this problem to be for past 2 weeks. She was seen in urgent care on 08/17/2013 and was prescribed clindamycin. Initially, redness and swelling seemed to be improving but over past 2 days prior to this admission there was an increase in tenderness and redness. Pt was started on vancomycin for treatment of cellulitis. No evidence of abcess or osteomyelitis on imaging studies.   Assessment/Plan:   Principal problem:  Left lower extremity cellulitis  Noted with central area of eschar. X ray with no evidence of osteomyelitis. CT scan did not show evidence of osteo or an abscess. Continue vancomycin for now Supportive care with analgesia as needed Active Problems:  HYPOTHYROIDISM  Continue Synthroid HYPERTENSION  Continue losartan and metoprolol Chronic obstructive asthma, unspecified  Stable  Pure hypercholesterolemia  Continue omega-3 supplementation. DVT Prophylaxis  Lovenox subQ while pt is in hospital    Code Status: Full.  Family Communication: plan of care discussed with the patient  Disposition Plan: Home when stable.    IV Access:   Peripheral IV Procedures and diagnostic studies:    Ct Tibia Fibula Left W Contrast 08/25/2013   Cellulitis but no CT findings for septic arthritis, osteomyelitis, myofasciitis or focal soft tissue abscess. If symptoms persist or worsen MR is suggested for further evaluation.     Dg Tibia/fibula Left Port  08/25/2013  No acute findings.   Medical Consultants:   None  Other Consultants:   Physical therapy Anti-Infectives:    Vancomycin 08/25/2013 -->   Leisa Lenz, MD  [facility] Pager [phone]  If 7PM-7AM, please contact night-coverage [email or web address] Password [email or web address] 08/26/2013, 12:00 PM   LOS: 2 days    HPI/Subjective: No acute overnight events.  Objective: Filed Vitals:   08/25/13 1400 08/25/13 2013 08/26/13 0511 08/26/13 0600  BP: 158/60 168/69 173/70 154/68  Pulse: 80 74 68   Temp: 98.6 F (37 C) 98.1 F (36.7 C) 97.6 F (36.4 C)   TempSrc: Oral Oral Oral   Resp: 18 16 18    Height:      Weight:      SpO2: 98% 93% 97%     Intake/Output Summary (Last 24 hours) at 08/26/13 1200 Last data filed at 08/25/13 2237  Gross per 24 hour  Intake  412.2 ml  Output      0 ml  Net  412.2 ml    Exam:   General:  Pt is alert, follows commands appropriately, not in acute distress  Cardiovascular: Regular rate and rhythm, S1/S2, no murmurs  Respiratory: Clear to auscultation bilaterally, no wheezing, no crackles, no rhonchi  Abdomen: Soft, non tender, non distended, bowel sounds present  Extremities: left lower extremity erythema better, central area of eschar, pulses DP and PT palpable bilaterally  Neuro: Grossly nonfocal  Data Reviewed: Basic Metabolic Panel:  Recent Labs Lab 08/24/13 1913 08/25/13 0418  NA 134* 134*  K 4.4 4.4  CL 96 100  CO2 26 25  GLUCOSE 114* 88  BUN 31* 25*  CREATININE 0.99 0.83  CALCIUM 9.8 8.8   Liver Function Tests:  Recent Labs Lab 08/24/13 1913  AST 19  ALT 12  ALKPHOS 52  BILITOT 0.3  PROT 7.1  ALBUMIN 3.5   No results found for this basename: LIPASE, AMYLASE,  in the last 168 hours No results found for this basename: AMMONIA,  in the last 168 hours CBC:  Recent Labs Lab 08/24/13 1913 08/25/13 0418  WBC 9.5 7.5  NEUTROABS 6.7  --   HGB 12.6 11.5*  HCT 37.1 34.6*  MCV 88.8 89.6  PLT 263 218   Cardiac Enzymes: No results found for this basename: CKTOTAL, CKMB, CKMBINDEX, TROPONINI,  in the last 168 hours BNP: No  components found with this basename: POCBNP,  CBG: No results found for this basename: GLUCAP,  in the last 168 hours  No results found for this or any previous visit (from the past 240 hour(s)).   Scheduled Meds: . antiseptic oral rinse  7 mL Mouth Rinse BID  . B-complex with vitamin C  1 tablet Oral q morning - 10a  . cholecalciferol  1,000 Units Oral Daily  . docusate sodium  100 mg Oral BID  . enoxaparin (LOVENOX) injection  40 mg Subcutaneous QHS  . gabapentin  300 mg Oral QHS  . levothyroxine  75 mcg Oral QAC breakfast  . LORazepam  1 mg Oral QHS  . losartan  50 mg Oral QPM  . metoprolol  50 mg Oral QPM  . multivitamin with minerals  1 tablet Oral q morning - 10a  . omega-3 acid ethyl esters  2 g Oral Daily  . vancomycin  1,250 mg Intravenous Q24H   Continuous Infusions: . sodium chloride 75 mL/hr at 08/25/13 2237

## 2013-08-26 NOTE — Consult Note (Signed)
NAMEMarland Kitchen  Joanna [name], Joanna [name]                ACCOUNT NO.:  [id number]  MEDICAL RECORD NO.:  [id number]  LOCATION:                                 FACILITY:  PHYSICIAN:  Irene Limbo, MD   DATE OF BIRTH:  [birth year]  DATE OF CONSULTATION:  08/24/2013 DATE OF DISCHARGE:  08/24/2013                                CONSULTATION   CHIEF COMPLAINT:  Traumatic wound to the left lower extremity.  HISTORY OF PRESENT ILLNESS:  The patient suffered abrasion and laceration to her left lower extremity from a plastic trash container on August 06, 2013.  She was seen in the emergency room on the date of injury.  Records indicate that patient went on simple repair of the laceration.  She re-presented to the emergency room approximately 10 days later with concerns of drainage from the wound and was diagnosed with cellulitis on August 24, 2013.  It appears her sutures removed at some point, and she was given clindamycin orally.  She was then seen by her primary care physician a few days later who instructed her to continue with the clindamycin.  She has now been on oral clindamycin for approximately 8 days' time with increasing pain and redness over her left lower extremity.  She is accompanied by a friend today.  They report no wound care over the legs.  PHYSICAL EXAMINATION:  She has absent sensation over her great toe and plantar first, third, and fifth metatarsal heads.  She has also has absent sensation over the left dorsal foot.  We were unable to obtain ABIs secondary to the pain associated with her wound. VITAL SIGNS:  Blood pressure is 126/68, pulse 77, temperature is 98.3. Height is 5 feet 7 inches, weight is 130 pounds. EXTREMITIES:  She has an area of abrasion over her left anterior lower extremity measures 2.1 x 3.4 x 0.1 cm.  She has palpable dorsalis pedis pulse.  She has visible varicosities present.  She has erythema that is extending at the previously made mark per report by the patient  from the emergency room.  She has pitting edema over lower extremity in her foot.  ASSESSMENT:  Cellulitis of the left lower extremity that has worsened while on oral clindamycin.  I have counseled the patient that she is to return to the emergency room for IV antibiotics, possible admission.  If she is not admitted, I counseled her that she should discuss with her primary care physician whether she would be able to get outpatient infusion or intramuscular shots as she has clearly not progressed with her oral antibiotics.  She should follow up in the wound center after the next 1-2 weeks' time and we can aid with her actual wound closure and counseled her to keep her leg elevated as much as possible.          ______________________________ Irene Limbo, MD MBA     BT/MEDQ  D:  08/24/2013  T:  08/25/2013  Job:  [id number]

## 2013-08-27 NOTE — Evaluation (Signed)
Physical Therapy Evaluation Patient Details Name: Joanna [name] MRN: [id number] DOB: March 20, 1928 Today's Date: 08/27/2013   History of Present Illness  80 yo female admitted with L LE cellultis. Hx of COPD, HTN, L THA 2014, ORIF L wrist 2014. Pt lives alone.   Clinical Impression  On eval, pt required supervision level assist for mobility-able to ambulate ~100 feet with rolling walker. Pt tolerated activity fairly well. Recommend HHPT at discharge. Pt states she has a friend that can help some; family can check on occasionally but they are not close by.     Follow Up Recommendations Home health PT    Equipment Recommendations  Rolling walker with 5" wheels (if pt does not have one)    Recommendations for Other Services OT consult     Precautions / Restrictions Precautions Precautions: Fall Restrictions Weight Bearing Restrictions: No      Mobility  Bed Mobility Overal bed mobility: Needs Assistance Bed Mobility: Supine to Sit;Sit to Supine     Supine to sit: Modified independent (Device/Increase time) Sit to supine: Modified independent (Device/Increase time)      Transfers Overall transfer level: Needs assistance Equipment used: Rolling walker (2 wheeled) Transfers: Sit to/from Stand Sit to Stand: Supervision         General transfer comment: VCs safety, hand placement. Uncontrolled descent to bed surface due to pt not using hands.   Ambulation/Gait Ambulation/Gait assistance: Supervision Ambulation Distance (Feet): 100 Feet Assistive device: Rolling walker (2 wheeled) Gait Pattern/deviations: Step-through pattern;Decreased stride length;Antalgic     General Gait Details: pt reports some pain with ambulation but tolerated distance well. No LOB with use of walker.   Stairs            Wheelchair Mobility    Modified Rankin (Stroke Patients Only)       Balance                                             Pertinent Vitals/Pain  Pain Assessment: Faces Faces Pain Scale: Hurts little more Pain Location: L LE    Home Living Family/patient expects to be discharged to:: Private residence Living Arrangements: Alone   Type of Home: House Home Access: Stairs to enter   [employer] of Steps: 2 Home Layout: One level Home Equipment: Environmental consultant - 2 wheels;Cane - single point      Prior Function Level of Independence: Independent with assistive device(s)               Hand Dominance   Dominant Hand: Right    Extremity/Trunk Assessment               Lower Extremity Assessment: Generalized weakness;LLE deficits/detail   LLE Deficits / Details: limited by pain  Cervical / Trunk Assessment: Normal  Communication   Communication: No difficulties  Cognition Arousal/Alertness: Awake/alert Behavior During Therapy: WFL for tasks assessed/performed Overall Cognitive Status: Within Functional Limits for tasks assessed                      General Comments      Exercises        Assessment/Plan    PT Assessment Patient needs continued PT services  PT Diagnosis Difficulty walking;Generalized weakness   PT Problem List Decreased strength;Decreased activity tolerance;Decreased balance;Decreased mobility;Decreased knowledge of use of DME;Pain  PT Treatment Interventions DME instruction;Gait training;Functional mobility  training;Therapeutic activities   PT Goals (Current goals can be found in the Care Plan section) Acute Rehab PT Goals Patient Stated Goal: home. less pain PT Goal Formulation: With patient Time For Goal Achievement: 09/10/13 Potential to Achieve Goals: Good    Frequency Min 3X/week   Barriers to discharge        Co-evaluation               End of Session Equipment Utilized During Treatment: Gait belt Activity Tolerance: Patient tolerated treatment well Patient left: in bed;with call bell/phone within reach;with family/visitor present            Time: 6256-3893 PT Time Calculation (min): 18 min   Charges:   PT Evaluation $Initial PT Evaluation Tier I: 1 Procedure PT Treatments $Gait Training: 8-22 mins   PT G Codes:          Weston Anna, MPT Pager: [phone]

## 2013-08-27 NOTE — Progress Notes (Signed)
Patient ID: Joanna [name], female   DOB: 29-Oct-1928, 80 y.o.   MRN: [id number] [facility] PROGRESS NOTE  SMT. LODER [id number]:[id number] DOB: 1928/07/25 DOA: 08/24/2013 PCP: Elayne Snare, MD  Brief narrative: 80 y.o. female. With past medical history of COPD, hypertension, hypothyroidism, dyslipidemia who presented to [facility] ED 08/24/2013 with worsening swelling, redness and tenderness over left lower extremities. She reports this problem to be for past 2 weeks. She was seen in urgent care on 08/17/2013 and was prescribed clindamycin. Initially, redness and swelling seemed to be improving but over past 2 days prior to this admission there was an increase in tenderness and redness. Pt was started on vancomycin for treatment of cellulitis. No evidence of abcess or osteomyelitis on imaging studies.   Assessment/Plan:   Principal problem:  Left lower extremity cellulitis  Noted with central area of eschar. X ray with no evidence of osteomyelitis. CT scan did not show evidence of osteo or an abscess.  Continue vancomycin. Cellulitis improving. Supportive care with analgesia as needed Active Problems:  HYPOTHYROIDISM  Continue Synthroid HYPERTENSION  Continue losartan and metoprolol Chronic obstructive asthma, unspecified  Stable  Pure hypercholesterolemia  Continue omega-3 supplementation. DVT Prophylaxis  Lovenox subQ while pt is in hospital    Code Status: Full.  Family Communication: plan of care discussed with the patient  Disposition Plan: Home when stable.   IV Access:   Peripheral IV Procedures and diagnostic studies:   Ct Tibia Fibula Left W Contrast 08/25/2013 Cellulitis but no CT findings for septic arthritis, osteomyelitis, myofasciitis or focal soft tissue abscess. If symptoms persist or worsen MR is suggested for further evaluation.  Dg Tibia/fibula Left Port 08/25/2013 No acute findings.  Medical Consultants:   None  Other Consultants:   Physical therapy Anti-Infectives:    Vancomycin 08/25/2013 -->   Leisa Lenz, MD  [facility] Pager [phone]  If 7PM-7AM, please contact night-coverage [email or web address] Password [email or web address] 08/27/2013, 9:58 AM   LOS: 3 days    HPI/Subjective: No acute overnight events.  Objective: Filed Vitals:   08/26/13 0600 08/26/13 1400 08/26/13 2035 08/27/13 0700  BP: 154/68 150/60 150/64 153/64  Pulse:  70 71 72  Temp:  97.8 F (36.6 C) 98.2 F (36.8 C) 97.6 F (36.4 C)  TempSrc:  Oral Oral Oral  Resp:  18 16 14   Height:      Weight:      SpO2:  97% 98% 98%    Intake/Output Summary (Last 24 hours) at 08/27/13 0958 Last data filed at 08/27/13 0651  Gross per 24 hour  Intake 2339.5 ml  Output      0 ml  Net 2339.5 ml    Exam:   General:  Pt is alert, follows commands appropriately, not in acute distress  Cardiovascular: Regular rate and rhythm, S1/S2, no murmurs  Respiratory: Clear to auscultation bilaterally, no wheezing, no crackles, no rhonchi  Abdomen: Soft, non tender, non distended, bowel sounds present  Extremities: cellulitis on LLE improving although central eschar area still about the same, no drainage,, pulses DP and PT palpable bilaterally  Neuro: Grossly nonfocal  Data Reviewed: Basic Metabolic Panel:  Recent Labs Lab 08/24/13 1913 08/25/13 0418  NA 134* 134*  K 4.4 4.4  CL 96 100  CO2 26 25  GLUCOSE 114* 88  BUN 31* 25*  CREATININE 0.99 0.83  CALCIUM 9.8 8.8   Liver Function Tests:  Recent Labs Lab 08/24/13 1913  AST 19  ALT 12  ALKPHOS 52  BILITOT 0.3  PROT 7.1  ALBUMIN 3.5   No results found for this basename: LIPASE, AMYLASE,  in the last 168 hours No results found for this basename: AMMONIA,  in the last 168 hours CBC:  Recent Labs Lab 08/24/13 1913 08/25/13 0418  WBC 9.5 7.5  NEUTROABS 6.7  --   HGB 12.6 11.5*  HCT 37.1 34.6*  MCV 88.8 89.6  PLT 263 218   Cardiac Enzymes: No results found for this basename: CKTOTAL, CKMB, CKMBINDEX, TROPONINI,  in  the last 168 hours BNP: No components found with this basename: POCBNP,  CBG: No results found for this basename: GLUCAP,  in the last 168 hours  No results found for this or any previous visit (from the past 240 hour(s)).   Scheduled Meds: . antiseptic oral rinse  7 mL Mouth Rinse BID  . B-complex with vitamin C  1 tablet Oral q morning - 10a  . cholecalciferol  1,000 Units Oral Daily  . docusate sodium  100 mg Oral BID  . enoxaparin (LOVENOX) injection  40 mg Subcutaneous QHS  . gabapentin  300 mg Oral QHS  . levothyroxine  75 mcg Oral QAC breakfast  . LORazepam  1 mg Oral QHS  . losartan  50 mg Oral QPM  . metoprolol  50 mg Oral QPM  . multivitamin with minerals  1 tablet Oral q morning - 10a  . omega-3 acid ethyl esters  2 g Oral Daily  . vancomycin  1,250 mg Intravenous Q24H   Continuous Infusions: . sodium chloride 75 mL/hr at 08/27/13 [phone]

## 2013-08-27 NOTE — Care Management Note (Addendum)
    Page 1 of 2   08/28/2013     8:09:34 AM CARE MANAGEMENT NOTE 08/28/2013  Patient:  Joanna [name],Joanna [name]   Account Number:  [id number]  Date Initiated:  08/27/2013  Documentation initiated by:  ,  Subjective/Objective Assessment:   adm:  cellulitis of left lower extremities     Action/Plan:   discharge planning   Anticipated DC Date:  08/27/2013   Anticipated DC Plan:  HOME W HOME HEALTH SERVICES      DC Planning Services  CM consult      PAC Choice  HOME HEALTH   Choice offered to / List presented to:  C-1 Patient   DME arranged  WALKER - ROLLING      DME agency  [facility]     HH arranged  HH-2 PT      HH agency  [facility]   Status of service:  Completed, signed off Medicare Important Message given?  YES (If response is "NO", the following Medicare IM given date fields will be blank) Date Medicare IM given:  08/24/2013 Medicare IM given by:   Date Additional Medicare IM given:  08/27/2013 Additional Medicare IM given by:     Discharge Disposition:  HOME W HOME HEALTH SERVICES  Per UR Regulation:    If discussed at Long Length of Stay Meetings, dates discussed:    Comments:  08/28/13 08:00 CM notes orders are in for HHPT/OT/RN/aide. RW delivered to room.  [facility] notified of update.  No other CM needs were communicated.  , BSN, CM [phone].  08/27/13 14:00 CM met with pt to offer choice of home health agency.  Pt chooses [facility] to render HHPT.  Address and contact numbers verified with pt.  Referral made to [facility] rep, Kristen.  [facility] DME rep to deliver rolling walker to room prior to discharge.  Orders and F-2-F requested of MD. Waiting for orders.   , BSN, CM [phone].   

## 2013-08-27 NOTE — Progress Notes (Signed)
[facility] is providing the following services: RW  If patient discharges after hours, please call [phone].   Joanna [name] 08/27/2013, 3:27 PM

## 2013-08-28 LAB — VANCOMYCIN, TROUGH: Vancomycin Tr: 9.5 ug/mL — ABNORMAL LOW (ref 10.0–20.0)

## 2013-08-28 MED ORDER — DOXYCYCLINE HYCLATE 100 MG PO TABS: 100.0000 mg | ORAL_TABLET | Freq: Two times a day (BID) | ORAL | Status: DC

## 2013-08-28 NOTE — Discharge Instructions (Signed)

## 2013-08-28 NOTE — Discharge Summary (Signed)
Physician Discharge Summary  Joanna [name] QPR:[id number] DOB: Aug 14, 1928 DOA: 08/24/2013  PCP: Elayne Snare, MD  Admit date: 08/24/2013 Discharge date: 08/28/2013  Recommendations for Outpatient Follow-up:  1. Continue doxycycline for 7 days on discharge for cellulitis.  Discharge Diagnoses:  Active Problems:   HYPOTHYROIDISM   HYPERTENSION   Chronic obstructive asthma, unspecified   GERD   Irritable bowel syndrome   Pure hypercholesterolemia   Cellulitis of left lower extremity   Dehydration   Cellulitis and abscess of leg, except foot    Discharge Condition: stable   Diet recommendation: as tolerated   History of present illness:  80 y.o. female. With past medical history of COPD, hypertension, hypothyroidism, dyslipidemia who presented [facility] ED 08/24/2013 with worsening swelling, redness and tenderness over left lower extremities. She reports this problem to be for past 2 weeks. She was seen in urgent care on 08/17/2013 and was prescribed clindamycin. Initially, redness and swelling seemed to be improving but over past 2 days prior to this admission there was an increase in tenderness and redness. Pt was started on vancomycin for treatment of cellulitis. No evidence of abcess or osteomyelitis on imaging studies.   Assessment/Plan:   Principal problem:  Left lower extremity cellulitis  Noted with central area of eschar. X ray with no evidence of osteomyelitis. CT scan did not show evidence of osteo or an abscess.  Cellulitis improving. Doxycycline on discharge for 7 days. Active Problems:  HYPOTHYROIDISM  Continue Synthroid HYPERTENSION  Continue losartan and metoprolol Chronic obstructive asthma, unspecified  Stable  Pure hypercholesterolemia  Continue omega-3 supplementation. DVT Prophylaxis  Lovenox subQ while pt is in hospital    Code Status: Full.  Family Communication: plan of care discussed with the patient   IV Access:   Peripheral IV Procedures and  diagnostic studies:   Ct Tibia Fibula Left W Contrast 08/25/2013 Cellulitis but no CT findings for septic arthritis, osteomyelitis, myofasciitis or focal soft tissue abscess. If symptoms persist or worsen MR is suggested for further evaluation.  Dg Tibia/fibula Left Port 08/25/2013 No acute findings.  Medical Consultants:   None  Other Consultants:   Physical therapy Anti-Infectives:   Vancomycin 08/25/2013 --> 08/28/2013 Doxycycline on discharge for 7 days.   SignedLeisa Lenz, MD  [facility] 08/28/2013, 7:39 AM  Pager #: [phone]   Discharge Exam: Filed Vitals:   08/28/13 0437  BP: 131/50  Pulse: 70  Temp: 98 F (36.7 C)  Resp: 16   Filed Vitals:   08/27/13 0700 08/27/13 1420 08/27/13 2113 08/28/13 0437  BP: 153/64 165/61 157/71 131/50  Pulse: 72 82 77 70  Temp: 97.6 F (36.4 C) 99.2 F (37.3 C) 97.9 F (36.6 C) 98 F (36.7 C)  TempSrc: Oral Oral Oral Oral  Resp: 14 16 16 16   Height:      Weight:      SpO2: 98% 99% 100% 100%    General: Pt is alert, follows commands appropriately, not in acute distress Cardiovascular: Regular rate and rhythm, S1/S2 +, no murmurs Respiratory: Clear to auscultation bilaterally, no wheezing, no crackles, no rhonchi Abdominal: Soft, non tender, non distended, bowel sounds +, no guarding Extremities: no edema, no cyanosis, pulses palpable bilaterally DP and PT Neuro: Grossly nonfocal  Discharge Instructions  Discharge Instructions   Call MD for:  difficulty breathing, headache or visual disturbances    Complete by:  As directed      Call MD for:  persistant dizziness or light-headedness    Complete by:  As directed      Call MD for:  persistant nausea and vomiting    Complete by:  As directed      Call MD for:  severe uncontrolled pain    Complete by:  As directed      Diet - low sodium heart healthy    Complete by:  As directed      Discharge instructions    Complete by:  As directed   1. Continue doxycycline  for 7 days on discharge for cellulitis.     Increase activity slowly    Complete by:  As directed             Medication List    STOP taking these medications       clindamycin 300 MG capsule  Commonly known as:  CLEOCIN      TAKE these medications       b complex vitamins tablet  Take 1 tablet by mouth every morning.     cholecalciferol 1000 UNITS tablet  Commonly known as:  VITAMIN D  Take 1,000 Units by mouth daily.     doxycycline 100 MG tablet  Commonly known as:  VIBRA-TABS  Take 1 tablet (100 mg total) by mouth 2 (two) times daily.     fish oil-omega-3 fatty acids 1000 MG capsule  Take 2 g by mouth daily.     gabapentin 300 MG capsule  Commonly known as:  NEURONTIN  Take 2 capsules (600 mg  total) by mouth daily.     losartan 50 MG tablet  Commonly known as:  COZAAR  Take 1 tablet (50 mg total) by mouth every evening.     metoprolol 50 MG tablet  Commonly known as:  LOPRESSOR  Take 1 tablet (50 mg total) by mouth every evening.     multivitamin with minerals Tabs tablet  Take 1 tablet by mouth every morning.     naphazoline-pheniramine 0.025-0.3 % ophthalmic solution  Commonly known as:  NAPHCON-A  Place 2 drops into both eyes 4 (four) times daily as needed for irritation or allergies.     nortriptyline 25 MG capsule  Commonly known as:  PAMELOR  Take 25 mg by mouth at bedtime as needed for sleep.     omalizumab 150 MG injection  Commonly known as:  XOLAIR  Inject 150 mg into the skin every 28 (twenty-eight) days. Administered by Dr. Gwenette Greet ([phone]) [phone]     oxazepam 10 MG capsule  Commonly known as:  SERAX  Take 10 mg by mouth at bedtime as needed for sleep or anxiety.     PROAIR HFA 108 (90 BASE) MCG/ACT inhaler  Generic drug:  albuterol  Inhale 2 puffs into the lungs every 6 (six) hours as needed for wheezing or shortness of breath.     SYNTHROID 125 MCG tablet  Generic drug:  levothyroxine  Take 75 mcg by mouth daily before breakfast.  Take 1/2 tablet daily DAW     vitamin C 500 MG tablet  Commonly known as:  ASCORBIC ACID  Take 500 mg by mouth daily.     zoledronic acid 5 MG/100ML Soln injection  Commonly known as:  RECLAST  Inject 100 mLs (5 mg total) into the vein once.           Follow-up Information   Follow up with [facility]. (home health physical therapy)    Contact information:   [address] [place] [address] [phone]  Follow up with [facility], MD. Schedule an appointment as soon as possible for a visit in 1 week. (Follow up appt after recent hospitalization)    Specialty:  Endocrinology   Contact information:   [address] [place] [address] [phone]        The results of significant diagnostics from this hospitalization (including imaging, microbiology, ancillary and laboratory) are listed below for reference.    Significant Diagnostic Studies: Dg Lumbar Spine Complete  08/04/2013   CLINICAL DATA:  Low back pain and bilateral leg pain, no trauma  EXAM: LUMBAR SPINE - COMPLETE 4+ VIEW  COMPARISON:  KUB of 11/13/2011  FINDINGS: There is now a slight curvature of the lumbar spine convex to the left by 12 degrees. The bones are somewhat osteopenic. In the lateral view there is mild anterolisthesis of L4 on L5 by 5 mm and mild retrolisthesis of L3 on L4 by 5 mm. These changes appear to be due to degenerative change involving the facet joints of the lower lumbar spine. No compression deformity is seen. There is degenerative disc disease most marked at L5-S1.  IMPRESSION: 1. Degenerative disc disease at L5-S1. 2. Mild anterolisthesis of L4 on L5 and retrolisthesis of L3 on L4 by 5 mm. These changes most likely are degenerative in nature. 3. Mild curvature of the lumbar spine convex to the left by 12 degrees.   Electronically Signed   By: Ivar Drape M.D.   On: 08/04/2013 16:52   Ct Tibia Fibula Left W Contrast  08/25/2013   CLINICAL DATA:  Left  lower extremity pain and swelling.  EXAM: CT OF THE LEFT TIBIA AND FIBULA WITH CONTRAST  TECHNIQUE: Standard CT imaging of the left lower extremity was performed with IV contrast. Coronal and sagittal reformatted images were obtained.  CONTRAST:  121mL OMNIPAQUE IOHEXOL 300 MG/ML  SOLN  COMPARISON:  Radiographs 08/25/2013.  FINDINGS: Mild subcutaneous soft tissue swelling/ edema and skin thickening involving the distal aspect of the left lower extremity and also the ankle and foot. This is most likely cellulitis. No rim enhancing soft tissue abscess is identified. No CT findings to suggest septic arthritis or osteomyelitis. No findings for myofasciitis or pyomyositis.  IMPRESSION: Cellulitis but no CT findings for septic arthritis, osteomyelitis, myofasciitis or focal soft tissue abscess. If symptoms persist or worsen MR is suggested for further evaluation.   Electronically Signed   By: Kalman Jewels M.D.   On: 08/25/2013 11:34   Dg Tibia/fibula Left Port  08/25/2013   CLINICAL DATA:  Cellulitis, rule out abscess.  EXAM: PORTABLE LEFT TIBIA AND FIBULA - 2 VIEW  COMPARISON:  None.  FINDINGS: There are mild degenerative changes over the knee. There is no evidence of fracture, dislocation or osteolysis. No definite focal soft tissue abnormality.  IMPRESSION: No acute findings.   Electronically Signed   By: Marin Olp M.D.   On: 08/25/2013 08:48    Microbiology: No results found for this or any previous visit (from the past 240 hour(s)).   Labs: Basic Metabolic Panel:  Recent Labs Lab 08/24/13 1913 08/25/13 0418  NA 134* 134*  K 4.4 4.4  CL 96 100  CO2 26 25  GLUCOSE 114* 88  BUN 31* 25*  CREATININE 0.99 0.83  CALCIUM 9.8 8.8   Liver Function Tests:  Recent Labs Lab 08/24/13 1913  AST 19  ALT 12  ALKPHOS 52  BILITOT 0.3  PROT 7.1  ALBUMIN 3.5   No results found for this  basename: LIPASE, AMYLASE,  in the last 168 hours No results found for this basename: AMMONIA,  in the last  168 hours CBC:  Recent Labs Lab 08/24/13 1913 08/25/13 0418  WBC 9.5 7.5  NEUTROABS 6.7  --   HGB 12.6 11.5*  HCT 37.1 34.6*  MCV 88.8 89.6  PLT 263 218   Cardiac Enzymes: No results found for this basename: CKTOTAL, CKMB, CKMBINDEX, TROPONINI,  in the last 168 hours BNP: BNP (last 3 results) No results found for this basename: PROBNP,  in the last 8760 hours CBG: No results found for this basename: GLUCAP,  in the last 168 hours  Time coordinating discharge: Over 30 minutes

## 2013-08-28 NOTE — ED Provider Notes (Signed)
CSN: [id number]     Arrival date & time 08/24/13  1738 History   First MD Initiated Contact with Patient 08/24/13 1922     Chief Complaint  Patient presents with  . Cellulitis     (Consider location/radiation/quality/duration/timing/severity/associated sxs/prior Treatment) HPI  80 year old female with pain, swelling redness or left lower extremity. Patient cut her left lower extremity clinical scan approximately week ago. She subsequently developed cellulitis. She has been taking clindamycin for possible past week. Initially seemed to be improving, but with the past 2-3 days her symptoms have worsened. Increasing pain. Increased swelling. Increasing redness. No fevers or chills. Pain is worse with ambulation. Reports compliance with her medicines.  Past Medical History  Diagnosis Date  . IBS (irritable bowel syndrome)   . Hypothyroid   . GERD (gastroesophageal reflux disease)   . Hypertension   . Allergic rhinitis   . Asthma   . Complication of anesthesia     confusion after 04/2012   Past Surgical History  Procedure Laterality Date  . Mastectomy      double  . Bladder surgery    . Nasal sinus surgery    . Hip arthroplasty Left 05/04/2012    Procedure: ARTHROPLASTY BIPOLAR HIP;  Surgeon: Mauri Pole, MD;  Location: [facility];  Service: Orthopedics;  Laterality: Left;  . Closed reduction wrist fracture Left 05/04/2012    Procedure: CLOSED REDUCTION WRIST;  Surgeon: Mauri Pole, MD;  Location: [facility];  Service: Orthopedics;  Laterality: Left;  with casting  . Open reduction internal fixation (orif) distal radial fracture Left 05/29/2012    Procedure: OPEN REDUCTION INTERNAL FIXATION (ORIF) DISTAL RADIAL FRACTURE;  Surgeon: Linna Hoff, MD;  Location: [place] [facility];  Service: Orthopedics;  Laterality: Left;   Family History  Problem Relation Age of Onset  . CAD Mother   . Hypertension Father   . Breast cancer Sister    History  Substance Use Topics  . Smoking status: Never Smoker    . Smokeless tobacco: Never Used  . Alcohol Use: No   OB History   Grav Para Term Preterm Abortions TAB SAB Ect Mult Living                 Review of Systems  All systems reviewed and negative, other than as noted in HPI.   Allergies  Cefuroxime axetil; Lactose intolerance (gi); Clarithromycin; Codeine; and Sulfonamide derivatives  Home Medications   Prior to Admission medications   Medication Sig Start Date End Date Taking? Authorizing Provider  b complex vitamins tablet Take 1 tablet by mouth every morning.   Yes Historical Provider, MD  cholecalciferol (VITAMIN D) 1000 UNITS tablet Take 1,000 Units by mouth daily.   Yes Historical Provider, MD  clindamycin (CLEOCIN) 300 MG capsule Take 1 capsule 3 times a day for 5 days 08/21/13  Yes Elayne Snare, MD  fish oil-omega-3 fatty acids 1000 MG capsule Take 2 g by mouth daily.   Yes Historical Provider, MD  gabapentin (NEURONTIN) 300 MG capsule Take 2 capsules (600 mg  total) by mouth daily.   Yes Elayne Snare, MD  levothyroxine (SYNTHROID) 125 MCG tablet Take 75 mcg by mouth daily before breakfast. Take 1/2 tablet daily DAW 03/31/13  Yes Elayne Snare, MD  losartan (COZAAR) 50 MG tablet Take 1 tablet (50 mg total) by mouth every evening. 03/25/13  Yes Elayne Snare, MD  metoprolol (LOPRESSOR) 50 MG tablet Take 1 tablet (50 mg total) by mouth every evening. 03/25/13  Yes  Elayne Snare, MD  Multiple Vitamin (MULTIVITAMIN WITH MINERALS) TABS Take 1 tablet by mouth every morning.   Yes Historical Provider, MD  naphazoline-pheniramine (NAPHCON-A) 0.025-0.3 % ophthalmic solution Place 2 drops into both eyes 4 (four) times daily as needed for irritation or allergies.   Yes Historical Provider, MD  nortriptyline (PAMELOR) 25 MG capsule Take 25 mg by mouth at bedtime as needed for sleep.  11/24/12  Yes Elayne Snare, MD  oxazepam (SERAX) 10 MG capsule Take 10 mg by mouth at bedtime as needed for sleep or anxiety.   Yes Historical Provider, MD  vitamin C (ASCORBIC  ACID) 500 MG tablet Take 500 mg by mouth daily.   Yes Historical Provider, MD  albuterol (PROAIR HFA) 108 (90 BASE) MCG/ACT inhaler Inhale 2 puffs into the lungs every 6 (six) hours as needed for wheezing or shortness of breath.     Historical Provider, MD  doxycycline (VIBRA-TABS) 100 MG tablet Take 1 tablet (100 mg total) by mouth 2 (two) times daily. 08/28/13   Robbie Lis, MD  omalizumab Arvid Right) 150 MG injection Inject 150 mg into the skin every 28 (twenty-eight) days. Administered by Dr. Gwenette Greet [phone]    Historical Provider, MD  zoledronic acid (RECLAST) 5 MG/100ML SOLN injection Inject 100 mLs (5 mg total) into the vein once. 01/23/13   Elayne Snare, MD   BP 131/50  Pulse 70  Temp(Src) 98 F (36.7 C) (Oral)  Resp 16  Ht 5\' 7"  (1.702 m)  Wt 132 lb 15 oz (60.3 kg)  BMI 20.82 kg/m2  SpO2 100% Physical Exam  Nursing note and vitals reviewed. Constitutional: She appears well-developed and well-nourished. No distress.  HENT:  Head: Normocephalic and atraumatic.  Eyes: Conjunctivae are normal. Right eye exhibits no discharge. Left eye exhibits no discharge.  Neck: Neck supple.  Cardiovascular: Normal rate, regular rhythm and normal heart sounds.  Exam reveals no gallop and no friction rub.   No murmur heard. Pulmonary/Chest: Effort normal and breath sounds normal. No respiratory distress.  Abdominal: Soft. She exhibits no distension. There is no tenderness.  Musculoskeletal: She exhibits no edema and no tenderness.  Neurological: She is alert.  Skin:     Changes consistent with cellulitis of left lower extremity in  depicted area.  Psychiatric: She has a normal mood and affect. Her behavior is normal. Thought content normal.    ED Course  Procedures (including critical care time) Labs Review Labs Reviewed  COMPREHENSIVE METABOLIC PANEL - Abnormal; Notable for the following:    Sodium 134 (*)    Glucose, Bld 114 (*)    BUN 31 (*)    GFR calc non Af Amer 52 (*)    GFR  calc Af Amer 60 (*)    All other components within normal limits  BASIC METABOLIC PANEL - Abnormal; Notable for the following:    Sodium 134 (*)    BUN 25 (*)    GFR calc non Af Amer 64 (*)    GFR calc Af Amer 74 (*)    All other components within normal limits  CBC - Abnormal; Notable for the following:    RBC 3.86 (*)    Hemoglobin 11.5 (*)    HCT 34.6 (*)    All other components within normal limits  VANCOMYCIN, TROUGH - Abnormal; Notable for the following:    Vancomycin Tr 9.5 (*)    All other components within normal limits  CBC WITH DIFFERENTIAL    Imaging Review No results  found.   EKG Interpretation None      MDM   Final diagnoses:  Cellulitis of left lower extremity   80 year old female with cellulitis of left lower extremity. No exam evidence of cellulitis..Failed outpatient therapy with clindamycin. Will admit for IV antibiotics.     Virgel Manifold, MD 08/28/13 [phone]

## 2013-08-31 DIAGNOSIS — L03119 Cellulitis of unspecified part of limb: Secondary | ICD-10-CM | POA: Diagnosis not present

## 2013-08-31 DIAGNOSIS — J441 Chronic obstructive pulmonary disease with (acute) exacerbation: Secondary | ICD-10-CM | POA: Diagnosis not present

## 2013-08-31 DIAGNOSIS — M6281 Muscle weakness (generalized): Secondary | ICD-10-CM | POA: Diagnosis not present

## 2013-08-31 DIAGNOSIS — L02419 Cutaneous abscess of limb, unspecified: Secondary | ICD-10-CM | POA: Diagnosis not present

## 2013-08-31 DIAGNOSIS — I10 Essential (primary) hypertension: Secondary | ICD-10-CM | POA: Diagnosis not present

## 2013-09-02 ENCOUNTER — Other Ambulatory Visit: Payer: Self-pay | Admitting: Endocrinology

## 2013-09-03 ENCOUNTER — Telehealth: Payer: Self-pay | Admitting: Endocrinology

## 2013-09-03 DIAGNOSIS — I10 Essential (primary) hypertension: Secondary | ICD-10-CM | POA: Diagnosis not present

## 2013-09-03 DIAGNOSIS — L02419 Cutaneous abscess of limb, unspecified: Secondary | ICD-10-CM | POA: Diagnosis not present

## 2013-09-03 DIAGNOSIS — M6281 Muscle weakness (generalized): Secondary | ICD-10-CM | POA: Diagnosis not present

## 2013-09-03 DIAGNOSIS — J441 Chronic obstructive pulmonary disease with (acute) exacerbation: Secondary | ICD-10-CM | POA: Diagnosis not present

## 2013-09-03 NOTE — Telephone Encounter (Signed)
Patient would like a refill of medications Losartan 580 mg, Synthroid 200 mg, Metoprolol 50 mg.  Thank you

## 2013-09-03 NOTE — Telephone Encounter (Signed)
Those were sent to [facility] this morning

## 2013-09-04 ENCOUNTER — Telehealth: Payer: Self-pay | Admitting: Endocrinology

## 2013-09-04 DIAGNOSIS — L02419 Cutaneous abscess of limb, unspecified: Secondary | ICD-10-CM | POA: Diagnosis not present

## 2013-09-04 DIAGNOSIS — M6281 Muscle weakness (generalized): Secondary | ICD-10-CM | POA: Diagnosis not present

## 2013-09-04 DIAGNOSIS — J441 Chronic obstructive pulmonary disease with (acute) exacerbation: Secondary | ICD-10-CM | POA: Diagnosis not present

## 2013-09-04 DIAGNOSIS — I10 Essential (primary) hypertension: Secondary | ICD-10-CM | POA: Diagnosis not present

## 2013-09-04 NOTE — Telephone Encounter (Signed)
Donita from home healthcare, asked if she could get and order for patient to have a Education officer, museum to come out and see her , she is having trouble get transportation getting to her f/u office visits.  And she is also complaining about a tightness in her lower back area. Please advise.

## 2013-09-04 NOTE — Telephone Encounter (Signed)
Please see below and advise.

## 2013-09-04 NOTE — Telephone Encounter (Signed)
Ok to arrange Education officer, museum, will review symptoms on visit

## 2013-09-07 DIAGNOSIS — R609 Edema, unspecified: Secondary | ICD-10-CM | POA: Diagnosis not present

## 2013-09-07 DIAGNOSIS — L03119 Cellulitis of unspecified part of limb: Secondary | ICD-10-CM | POA: Diagnosis not present

## 2013-09-07 DIAGNOSIS — L02419 Cutaneous abscess of limb, unspecified: Secondary | ICD-10-CM | POA: Diagnosis not present

## 2013-09-08 DIAGNOSIS — L03119 Cellulitis of unspecified part of limb: Secondary | ICD-10-CM | POA: Diagnosis not present

## 2013-09-08 DIAGNOSIS — J441 Chronic obstructive pulmonary disease with (acute) exacerbation: Secondary | ICD-10-CM | POA: Diagnosis not present

## 2013-09-08 DIAGNOSIS — I10 Essential (primary) hypertension: Secondary | ICD-10-CM | POA: Diagnosis not present

## 2013-09-08 DIAGNOSIS — M6281 Muscle weakness (generalized): Secondary | ICD-10-CM | POA: Diagnosis not present

## 2013-09-08 DIAGNOSIS — L02419 Cutaneous abscess of limb, unspecified: Secondary | ICD-10-CM | POA: Diagnosis not present

## 2013-09-09 ENCOUNTER — Ambulatory Visit: Payer: Medicare Other

## 2013-09-09 ENCOUNTER — Other Ambulatory Visit: Payer: Self-pay | Admitting: *Deleted

## 2013-09-09 DIAGNOSIS — M6281 Muscle weakness (generalized): Secondary | ICD-10-CM | POA: Diagnosis not present

## 2013-09-09 DIAGNOSIS — I10 Essential (primary) hypertension: Secondary | ICD-10-CM | POA: Diagnosis not present

## 2013-09-09 DIAGNOSIS — J441 Chronic obstructive pulmonary disease with (acute) exacerbation: Secondary | ICD-10-CM | POA: Diagnosis not present

## 2013-09-09 DIAGNOSIS — L02419 Cutaneous abscess of limb, unspecified: Secondary | ICD-10-CM | POA: Diagnosis not present

## 2013-09-09 MED ORDER — AZITHROMYCIN 250 MG PO TABS: ORAL_TABLET | ORAL | Status: DC

## 2013-09-09 NOTE — Telephone Encounter (Signed)
[facility] called and says patient is coughing up yellow mucus and is c/o of pain in her mid back.  Per Dr. Dwyane Dee z-pak was sent to patient's pharmacy.

## 2013-09-10 ENCOUNTER — Ambulatory Visit (INDEPENDENT_AMBULATORY_CARE_PROVIDER_SITE_OTHER): Payer: Medicare Other

## 2013-09-10 ENCOUNTER — Ambulatory Visit: Payer: Medicare Other | Admitting: Endocrinology

## 2013-09-10 DIAGNOSIS — Z23 Encounter for immunization: Secondary | ICD-10-CM | POA: Diagnosis not present

## 2013-09-10 DIAGNOSIS — J45909 Unspecified asthma, uncomplicated: Secondary | ICD-10-CM

## 2013-09-11 DIAGNOSIS — I10 Essential (primary) hypertension: Secondary | ICD-10-CM | POA: Diagnosis not present

## 2013-09-11 DIAGNOSIS — L02419 Cutaneous abscess of limb, unspecified: Secondary | ICD-10-CM | POA: Diagnosis not present

## 2013-09-11 DIAGNOSIS — M6281 Muscle weakness (generalized): Secondary | ICD-10-CM | POA: Diagnosis not present

## 2013-09-11 DIAGNOSIS — J441 Chronic obstructive pulmonary disease with (acute) exacerbation: Secondary | ICD-10-CM | POA: Diagnosis not present

## 2013-09-11 MED ORDER — OMALIZUMAB 150 MG ~~LOC~~ SOLR
150.0000 mg | Freq: Once | SUBCUTANEOUS | Status: AC
  Administered 2013-09-11: 150 mg via SUBCUTANEOUS

## 2013-09-12 DIAGNOSIS — M6281 Muscle weakness (generalized): Secondary | ICD-10-CM | POA: Diagnosis not present

## 2013-09-12 DIAGNOSIS — I10 Essential (primary) hypertension: Secondary | ICD-10-CM | POA: Diagnosis not present

## 2013-09-12 DIAGNOSIS — L02419 Cutaneous abscess of limb, unspecified: Secondary | ICD-10-CM | POA: Diagnosis not present

## 2013-09-12 DIAGNOSIS — J441 Chronic obstructive pulmonary disease with (acute) exacerbation: Secondary | ICD-10-CM | POA: Diagnosis not present

## 2013-09-15 DIAGNOSIS — I10 Essential (primary) hypertension: Secondary | ICD-10-CM | POA: Diagnosis not present

## 2013-09-15 DIAGNOSIS — L02419 Cutaneous abscess of limb, unspecified: Secondary | ICD-10-CM | POA: Diagnosis not present

## 2013-09-15 DIAGNOSIS — M6281 Muscle weakness (generalized): Secondary | ICD-10-CM | POA: Diagnosis not present

## 2013-09-15 DIAGNOSIS — J441 Chronic obstructive pulmonary disease with (acute) exacerbation: Secondary | ICD-10-CM | POA: Diagnosis not present

## 2013-09-16 DIAGNOSIS — L02419 Cutaneous abscess of limb, unspecified: Secondary | ICD-10-CM | POA: Diagnosis not present

## 2013-09-16 DIAGNOSIS — J441 Chronic obstructive pulmonary disease with (acute) exacerbation: Secondary | ICD-10-CM | POA: Diagnosis not present

## 2013-09-16 DIAGNOSIS — M6281 Muscle weakness (generalized): Secondary | ICD-10-CM | POA: Diagnosis not present

## 2013-09-16 DIAGNOSIS — I10 Essential (primary) hypertension: Secondary | ICD-10-CM | POA: Diagnosis not present

## 2013-09-17 DIAGNOSIS — M6281 Muscle weakness (generalized): Secondary | ICD-10-CM | POA: Diagnosis not present

## 2013-09-17 DIAGNOSIS — I10 Essential (primary) hypertension: Secondary | ICD-10-CM | POA: Diagnosis not present

## 2013-09-17 DIAGNOSIS — J441 Chronic obstructive pulmonary disease with (acute) exacerbation: Secondary | ICD-10-CM | POA: Diagnosis not present

## 2013-09-17 DIAGNOSIS — L02419 Cutaneous abscess of limb, unspecified: Secondary | ICD-10-CM | POA: Diagnosis not present

## 2013-09-22 DIAGNOSIS — L02419 Cutaneous abscess of limb, unspecified: Secondary | ICD-10-CM | POA: Diagnosis not present

## 2013-09-22 DIAGNOSIS — M6281 Muscle weakness (generalized): Secondary | ICD-10-CM | POA: Diagnosis not present

## 2013-09-22 DIAGNOSIS — L03119 Cellulitis of unspecified part of limb: Secondary | ICD-10-CM | POA: Diagnosis not present

## 2013-09-22 DIAGNOSIS — I10 Essential (primary) hypertension: Secondary | ICD-10-CM | POA: Diagnosis not present

## 2013-09-22 DIAGNOSIS — J441 Chronic obstructive pulmonary disease with (acute) exacerbation: Secondary | ICD-10-CM | POA: Diagnosis not present

## 2013-09-28 ENCOUNTER — Encounter (HOSPITAL_BASED_OUTPATIENT_CLINIC_OR_DEPARTMENT_OTHER): Payer: Medicare Other | Attending: Plastic Surgery

## 2013-09-28 DIAGNOSIS — L97909 Non-pressure chronic ulcer of unspecified part of unspecified lower leg with unspecified severity: Secondary | ICD-10-CM | POA: Insufficient documentation

## 2013-09-30 ENCOUNTER — Ambulatory Visit (INDEPENDENT_AMBULATORY_CARE_PROVIDER_SITE_OTHER): Payer: Medicare Other | Admitting: Endocrinology

## 2013-09-30 ENCOUNTER — Encounter: Payer: Self-pay | Admitting: Endocrinology

## 2013-09-30 VITALS — BP 143/60 | HR 83 | Temp 97.9°F | Resp 16 | Ht 67.0 in | Wt 130.4 lb

## 2013-09-30 DIAGNOSIS — E039 Hypothyroidism, unspecified: Secondary | ICD-10-CM | POA: Diagnosis not present

## 2013-09-30 DIAGNOSIS — G609 Hereditary and idiopathic neuropathy, unspecified: Secondary | ICD-10-CM | POA: Diagnosis not present

## 2013-09-30 DIAGNOSIS — I10 Essential (primary) hypertension: Secondary | ICD-10-CM | POA: Diagnosis not present

## 2013-09-30 DIAGNOSIS — Z23 Encounter for immunization: Secondary | ICD-10-CM

## 2013-09-30 DIAGNOSIS — E7800 Pure hypercholesterolemia, unspecified: Secondary | ICD-10-CM

## 2013-09-30 NOTE — Progress Notes (Signed)
Patient ID: Joanna [name], female   DOB: 02/05/1928, 80 y.o.   MRN: [id number]  Chief complaint: Followup   History of Present Illness:  She is here for followup   1. She had injured her left shin area with a trash can and suffered a laceration which got infected. She was treated with multiple antibiotics including in the hospital and currently is off antibiotics with no residual problems. She is still getting a dressing done of the area which was relatively ulcerated, no records are available of recent wound findings 2. Mild hyponatremia: She has had asymptomatic idiopathic hyponatremia at times. Needs to be followed up  3. Has history of hypothyroidism, mild and TSH has been consistently normal on 62.5 mcg daily. No unusual fatigue and needs followup level  Lab Results  Component Value Date   TSH 0.89 04/06/2013   4. History of idiopathic peripheral neuropathy. She is having relatively less pain recently in her legs and not much numbness. She has been taking gabapentin again   6. HYPERTENSION: She has had mild to moderate hypertension with usually good control and requiring small doses of medications. She on metoprolol and losartan.        Medication List       This list is accurate as of: 09/30/13  1:43 PM.  Always use your most recent med list.               b complex vitamins tablet  Take 1 tablet by mouth every morning.     cholecalciferol 1000 UNITS tablet  Commonly known as:  VITAMIN D  Take 1,000 Units by mouth daily.     fish oil-omega-3 fatty acids 1000 MG capsule  Take 2 g by mouth daily.     gabapentin 300 MG capsule  Commonly known as:  NEURONTIN  Take 2 capsules by mouth  daily     losartan 50 MG tablet  Commonly known as:  COZAAR  Take 1 tablet (50 mg total) by mouth every evening.     metoprolol 50 MG tablet  Commonly known as:  LOPRESSOR  Take 1 tablet (50 mg total) by mouth every evening.     multivitamin with minerals Tabs tablet  Take 1  tablet by mouth every morning.     naphazoline-pheniramine 0.025-0.3 % ophthalmic solution  Commonly known as:  NAPHCON-A  Place 2 drops into both eyes 4 (four) times daily as needed for irritation or allergies.     nortriptyline 25 MG capsule  Commonly known as:  PAMELOR  Take 25 mg by mouth at bedtime as needed for sleep.     omalizumab 150 MG injection  Commonly known as:  XOLAIR  Inject 150 mg into the skin every 28 (twenty-eight) days. Administered by Dr. Gwenette Greet [phone]     omeprazole 20 MG capsule  Commonly known as:  PRILOSEC  Take 1 capsule by mouth  every morning     oxazepam 10 MG capsule  Commonly known as:  SERAX  Take 10 mg by mouth at bedtime as needed for sleep or anxiety.     PROAIR HFA 108 (90 BASE) MCG/ACT inhaler  Generic drug:  albuterol  Inhale 2 puffs into the lungs every 6 (six) hours as needed for wheezing or shortness of breath.     simvastatin 20 MG tablet  Commonly known as:  ZOCOR  Take 1 tablet by mouth  every evening     SYNTHROID 125 MCG tablet  Generic drug:  levothyroxine  Take 75 mcg by mouth daily before breakfast. Take 1/2 tablet daily DAW     SYNTHROID 125 MCG tablet  Generic drug:  levothyroxine  Take one-half tablet by  mouth daily     vitamin C 500 MG tablet  Commonly known as:  ASCORBIC ACID  Take 500 mg by mouth daily.     zoledronic acid 5 MG/100ML Soln injection  Commonly known as:  RECLAST  Inject 100 mLs (5 mg total) into the vein once.        Allergies:  Allergies  Allergen Reactions  . Cefuroxime Axetil Hives    Not sure.. Patient can be allergic to it today but a year from now she may not be.  . Lactose Intolerance (Gi) Other (See Comments)    Gi upset  . Clarithromycin Palpitations  . Codeine Palpitations  . Sulfonamide Derivatives Palpitations    Past Medical History  Diagnosis Date  . IBS (irritable bowel syndrome)   . Hypothyroid   . GERD (gastroesophageal reflux disease)   . Hypertension    . Allergic rhinitis   . Asthma   . Complication of anesthesia     confusion after 04/2012    Past Surgical History  Procedure Laterality Date  . Mastectomy      double  . Bladder surgery    . Nasal sinus surgery    . Hip arthroplasty Left 05/04/2012    Procedure: ARTHROPLASTY BIPOLAR HIP;  Surgeon: Mauri Pole, MD;  Location: [facility];  Service: Orthopedics;  Laterality: Left;  . Closed reduction wrist fracture Left 05/04/2012    Procedure: CLOSED REDUCTION WRIST;  Surgeon: Mauri Pole, MD;  Location: [facility];  Service: Orthopedics;  Laterality: Left;  with casting  . Open reduction internal fixation (orif) distal radial fracture Left 05/29/2012    Procedure: OPEN REDUCTION INTERNAL FIXATION (ORIF) DISTAL RADIAL FRACTURE;  Surgeon: Linna Hoff, MD;  Location: [place];  Service: Orthopedics;  Laterality: Left;    Family History  Problem Relation Age of Onset  . CAD Mother   . Hypertension Father   . Breast cancer Sister     Social History:  reports that she has never smoked. She has never used smokeless tobacco. She reports that she does not drink alcohol or use illicit drugs.  Review of Systems -   History of osteoporosis/osteopenia. She had a bone density in 2014 which showed osteopenia with T score -2.3. She had refused to take Fosamax. She did get a Reclast infusion without any side effects in 01/2013 She is taking her calcium and vitamin D preparation as well as 1000 units of vitamin D 3. Vitamin D level is normal  Had  multiple fractures from fall in 2014 Bone density to be done in followup  Has had COPD followed by pulmonologist   No weight loss recently, appetite is good. However did lose weight since earlier this year  Wt Readings from Last 3 Encounters:  09/30/13 130 lb 6.4 oz (59.149 kg)  08/24/13 132 lb 15 oz (60.3 kg)  08/19/13 130 lb 9.6 oz (59.24 kg)    She has a history of hypercholesterolemia and has been taking her simvastatin regularly with adequate  control  Lab Results  Component Value Date   CHOL 178 11/05/2012   HDL 70.60 11/05/2012   LDLCALC 86 08/08/2012   LDLDIRECT 93.8 11/05/2012   TRIG 217.0* 11/05/2012   CHOLHDL 3 11/05/2012     History of chronic insomnia. She has tried various hypnotics  for this and lately has been taking oxazepam  Exam:  BP 143/60  Pulse 83  Temp(Src) 97.9 F (36.6 C)  Resp 16  Ht 5\' 7"  (1.702 m)  Wt 130 lb 6.4 oz (59.149 kg)  BMI 20.42 kg/m2  SpO2 99%  No ankle edema, no area of cellulitis on her left lower leg, still has a dressing on her wound  Assessment/Plan:   Multiple problems as detailed in history of present illness She does need followup labs to evaluate thyroid functions for hypothyroidism, CBC for her history of anemia, lipids and chemistry panel  She did have her influenza vaccine at [facility] and will need her Prevnar today  Tejasvi Brissett

## 2013-10-01 ENCOUNTER — Ambulatory Visit
Admission: RE | Admit: 2013-10-01 | Discharge: 2013-10-01 | Disposition: A | Discharge status: Home / Self Care | Payer: Medicare Other | Source: Ambulatory Visit | Attending: Endocrinology | Admitting: Endocrinology

## 2013-10-01 DIAGNOSIS — M810 Age-related osteoporosis without current pathological fracture: Secondary | ICD-10-CM

## 2013-10-01 LAB — COMPREHENSIVE METABOLIC PANEL
ALBUMIN: 3.7 g/dL (ref 3.5–5.2)
ALT: 16 U/L (ref 0–35)
AST: 28 U/L (ref 0–37)
Alkaline Phosphatase: 49 U/L (ref 39–117)
BUN: 33 mg/dL — ABNORMAL HIGH (ref 6–23)
CHLORIDE: 101 meq/L (ref 96–112)
CO2: 24 mEq/L (ref 19–32)
Calcium: 8.6 mg/dL (ref 8.4–10.5)
Creatinine, Ser: 0.9 mg/dL (ref 0.4–1.2)
GFR: 60.43 mL/min (ref >60.00)
Glucose, Bld: 112 mg/dL — ABNORMAL HIGH (ref 70–99)
POTASSIUM: 4.2 meq/L (ref 3.5–5.1)
Sodium: 134 mEq/L — ABNORMAL LOW (ref 135–145)
Total Bilirubin: 0.7 mg/dL (ref 0.2–1.2)
Total Protein: 6.7 g/dL (ref 6.0–8.3)

## 2013-10-01 LAB — LIPID PANEL
CHOLESTEROL: 228 mg/dL — AB (ref 0–200)
HDL: 64.8 mg/dL (ref >39.00)
LDL CALC: 150 mg/dL — AB (ref 0–99)
NonHDL: 163.2
Total CHOL/HDL Ratio: 4
Triglycerides: 67 mg/dL (ref 0.0–149.0)
VLDL: 13.4 mg/dL (ref 0.0–40.0)

## 2013-10-01 LAB — CBC
HCT: 35.9 % — ABNORMAL LOW (ref 36.0–46.0)
Hemoglobin: 12 g/dL (ref 12.0–15.0)
MCHC: 33.4 g/dL (ref 30.0–36.0)
MCV: 90.1 fl (ref 78.0–100.0)
PLATELETS: 308 10*3/uL (ref 150.0–400.0)
RBC: 3.98 Mil/uL (ref 3.87–5.11)
RDW: 13.3 % (ref 11.5–15.5)
WBC: 12 10*3/uL — ABNORMAL HIGH (ref 4.0–10.5)

## 2013-10-01 LAB — T4, FREE: FREE T4: 1.09 ng/dL (ref 0.60–1.60)

## 2013-10-01 LAB — TSH: TSH: 1.17 u[IU]/mL (ref 0.35–4.50)

## 2013-10-02 NOTE — Progress Notes (Signed)
Quick Note:  Please let patient know that most of the tests are okay, cholesterol is high, needs to restart simvastatin ______

## 2013-10-05 ENCOUNTER — Telehealth: Payer: Self-pay | Admitting: Endocrinology

## 2013-10-05 ENCOUNTER — Other Ambulatory Visit: Payer: Self-pay | Admitting: *Deleted

## 2013-10-05 MED ORDER — NORTRIPTYLINE HCL 25 MG PO CAPS: 25.0000 mg | ORAL_CAPSULE | Freq: Every evening | ORAL | Status: DC | PRN

## 2013-10-05 NOTE — Telephone Encounter (Signed)
Please advise, I do not see this in her med list.

## 2013-10-05 NOTE — Telephone Encounter (Signed)
Patient need refill of nortriptyline 25 mg

## 2013-10-05 NOTE — Telephone Encounter (Signed)
ok 

## 2013-10-13 DIAGNOSIS — I10 Essential (primary) hypertension: Secondary | ICD-10-CM | POA: Diagnosis not present

## 2013-10-13 DIAGNOSIS — J441 Chronic obstructive pulmonary disease with (acute) exacerbation: Secondary | ICD-10-CM | POA: Diagnosis not present

## 2013-10-13 DIAGNOSIS — M6281 Muscle weakness (generalized): Secondary | ICD-10-CM | POA: Diagnosis not present

## 2013-10-13 DIAGNOSIS — L03119 Cellulitis of unspecified part of limb: Secondary | ICD-10-CM | POA: Diagnosis not present

## 2013-10-13 DIAGNOSIS — L02419 Cutaneous abscess of limb, unspecified: Secondary | ICD-10-CM | POA: Diagnosis not present

## 2013-10-14 ENCOUNTER — Ambulatory Visit: Payer: Medicare Other

## 2013-10-19 ENCOUNTER — Encounter (HOSPITAL_BASED_OUTPATIENT_CLINIC_OR_DEPARTMENT_OTHER): Payer: Medicare Other | Attending: Plastic Surgery

## 2013-10-19 DIAGNOSIS — L97929 Non-pressure chronic ulcer of unspecified part of left lower leg with unspecified severity: Secondary | ICD-10-CM | POA: Diagnosis not present

## 2013-10-19 DIAGNOSIS — I878 Other specified disorders of veins: Secondary | ICD-10-CM | POA: Diagnosis not present

## 2013-10-21 ENCOUNTER — Telehealth: Payer: Self-pay | Admitting: Endocrinology

## 2013-10-21 NOTE — Telephone Encounter (Signed)
Pt's hair is fallingout can we refer her to a dermatologist

## 2013-10-21 NOTE — Telephone Encounter (Signed)
Please see below and advise.

## 2013-10-26 ENCOUNTER — Other Ambulatory Visit: Payer: Self-pay | Admitting: Endocrinology

## 2013-10-26 ENCOUNTER — Telehealth: Payer: Self-pay | Admitting: Endocrinology

## 2013-10-26 DIAGNOSIS — L659 Nonscarring hair loss, unspecified: Secondary | ICD-10-CM

## 2013-10-26 NOTE — Telephone Encounter (Signed)
She wants to see a dermatologist because her hair is falling out she says, please advise

## 2013-10-26 NOTE — Telephone Encounter (Signed)
Please see below and advise.

## 2013-10-26 NOTE — Telephone Encounter (Signed)
done

## 2013-10-26 NOTE — Telephone Encounter (Signed)
Patient would like to know if she is getting referred to a dermatologist   Please advise

## 2013-10-26 NOTE — Telephone Encounter (Addendum)
Patient states that she went to have her bone density test and they stated her insurance will not cover because she had her last scan on 12/18/12 Joanna [name] concern is that she has had some bone loss and would like to know if she should be taking anything for it?   Please advise    Thank you

## 2013-10-28 ENCOUNTER — Ambulatory Visit (INDEPENDENT_AMBULATORY_CARE_PROVIDER_SITE_OTHER): Payer: Medicare Other | Admitting: Pulmonary Disease

## 2013-10-28 ENCOUNTER — Encounter: Payer: Self-pay | Admitting: Pulmonary Disease

## 2013-10-28 VITALS — BP 114/62 | HR 80 | Temp 98.5°F | Ht 67.0 in | Wt 126.8 lb

## 2013-10-28 DIAGNOSIS — J449 Chronic obstructive pulmonary disease, unspecified: Secondary | ICD-10-CM | POA: Diagnosis not present

## 2013-10-28 DIAGNOSIS — J479 Bronchiectasis, uncomplicated: Secondary | ICD-10-CM | POA: Diagnosis not present

## 2013-10-28 MED ORDER — FLUTTER DEVI: Status: DC

## 2013-10-28 MED ORDER — AEROCHAMBER Z-STAT PLUS MISC: Status: DC

## 2013-10-28 NOTE — Assessment & Plan Note (Signed)
The patient is not continuing on her breo, but I have explained to her that she needs to stay on a maintenance medication for her asthma. I will therefore give her a trial of dulera to see if she is able to tolerate.

## 2013-10-28 NOTE — Progress Notes (Signed)
   Subjective:    Patient ID: Joanna [name], female    DOB: 1928-08-20, 80 y.o.   MRN: [id number]  HPI The patient comes in today for followup of her known chronic obstructive asthma along with bronchiectasis.  She is not taking her LABA/ICS from the last visit, because she feels that it makes her breathing worse. I have stressed to her the importance of staying on some type of maintenance medication for her asthma. She has been producing a lot of mucus, but has been able to cough it up fairly well.  With her history bronchiectasis, I wonder if she would benefit from a flutter valve. She is continuing on her Xolair for her significant allergic component.   Review of Systems  Constitutional: Negative for fever and unexpected weight change.  HENT: Positive for congestion and postnasal drip. Negative for dental problem, ear pain, nosebleeds, rhinorrhea, sinus pressure, sneezing, sore throat and trouble swallowing.   Eyes: Negative for redness and itching.  Respiratory: Positive for cough and shortness of breath. Negative for chest tightness and wheezing.   Cardiovascular: Negative for palpitations and leg swelling.  Gastrointestinal: Negative for nausea and vomiting.  Genitourinary: Negative for dysuria.  Musculoskeletal: Negative for joint swelling.  Skin: Negative for rash.  Neurological: Negative for headaches.  Hematological: Does not bruise/bleed easily.  Psychiatric/Behavioral: Negative for dysphoric mood. The patient is not nervous/anxious.        Objective:   Physical Exam Thin female in no acute distress Nose without purulence or discharge noted Neck without lymphadenopathy or thyromegaly Chest with decreased breath sounds, a few basilar crackles, no wheezing Cardiac exam with regular rate and rhythm Lower extremities without edema, no cyanosis Alert and oriented, moves all 4 extremities.       Assessment & Plan:

## 2013-10-28 NOTE — Patient Instructions (Signed)
Stay on xolair injections You need to be on some kind of inhaler for your asthma on a regular basis.  Will try you on dulera 200, take 2 inhalations am and pm.  Will use a spacer to help with delivery.  Rinse mouth well.  Let me know if this helps and can send in prescription.  Will try flutter valve twice a day to see if will help get your mucus out.  followup with me again in 30mos.

## 2013-10-28 NOTE — Assessment & Plan Note (Signed)
The patient continues to bring up mucus on a daily basis, but does not feel that she has a chest infection. Will try her on a flutter valve to see if she sees a difference.

## 2013-10-29 NOTE — Telephone Encounter (Signed)
She did get a Reclast infusion in 01/2013 and next one will be in January

## 2013-11-04 ENCOUNTER — Emergency Department (HOSPITAL_COMMUNITY): Payer: Medicare Other

## 2013-11-04 ENCOUNTER — Encounter (HOSPITAL_COMMUNITY): Payer: Self-pay | Admitting: Emergency Medicine

## 2013-11-04 ENCOUNTER — Emergency Department (HOSPITAL_COMMUNITY)
Admission: EM | Admit: 2013-11-04 | Discharge: 2013-11-04 | Disposition: A | Discharge status: Home / Self Care | Payer: Medicare Other | Attending: Emergency Medicine | Admitting: Emergency Medicine

## 2013-11-04 DIAGNOSIS — S79912A Unspecified injury of left hip, initial encounter: Secondary | ICD-10-CM | POA: Diagnosis present

## 2013-11-04 DIAGNOSIS — S7002XA Contusion of left hip, initial encounter: Secondary | ICD-10-CM | POA: Insufficient documentation

## 2013-11-04 DIAGNOSIS — I10 Essential (primary) hypertension: Secondary | ICD-10-CM | POA: Insufficient documentation

## 2013-11-04 DIAGNOSIS — E039 Hypothyroidism, unspecified: Secondary | ICD-10-CM | POA: Diagnosis not present

## 2013-11-04 DIAGNOSIS — J45909 Unspecified asthma, uncomplicated: Secondary | ICD-10-CM | POA: Insufficient documentation

## 2013-11-04 DIAGNOSIS — Z79899 Other long term (current) drug therapy: Secondary | ICD-10-CM | POA: Insufficient documentation

## 2013-11-04 DIAGNOSIS — K219 Gastro-esophageal reflux disease without esophagitis: Secondary | ICD-10-CM | POA: Diagnosis not present

## 2013-11-04 DIAGNOSIS — W010XXA Fall on same level from slipping, tripping and stumbling without subsequent striking against object, initial encounter: Secondary | ICD-10-CM | POA: Insufficient documentation

## 2013-11-04 DIAGNOSIS — Y92009 Unspecified place in unspecified non-institutional (private) residence as the place of occurrence of the external cause: Secondary | ICD-10-CM | POA: Insufficient documentation

## 2013-11-04 DIAGNOSIS — Y9389 Activity, other specified: Secondary | ICD-10-CM | POA: Diagnosis not present

## 2013-11-04 DIAGNOSIS — S7012XA Contusion of left thigh, initial encounter: Secondary | ICD-10-CM | POA: Diagnosis not present

## 2013-11-04 DIAGNOSIS — M25552 Pain in left hip: Secondary | ICD-10-CM | POA: Diagnosis not present

## 2013-11-04 MED ORDER — TRAMADOL HCL 50 MG PO TABS
50.0000 mg | ORAL_TABLET | Freq: Once | ORAL | Status: AC
  Administered 2013-11-04: 50 mg via ORAL
  Filled 2013-11-04: qty 1

## 2013-11-04 MED ORDER — TRAMADOL HCL 50 MG PO TABS: 50.0000 mg | ORAL_TABLET | Freq: Four times a day (QID) | ORAL | Status: DC | PRN

## 2013-11-04 NOTE — ED Notes (Signed)
Patient is alert and verbal. Full ROM noted.

## 2013-11-04 NOTE — ED Notes (Signed)
Pt states she fell at home Tuesday night and landed on her left hip  Pt states she had broken that hip a year ago in April  Pt states she is able to walk on it but when she first gets up to walk it hurts a lot  Pt states she just wants it checked to be sure it is ok

## 2013-11-04 NOTE — ED Provider Notes (Signed)
CSN: [id number]     Arrival date & time 11/04/13  2014 History   First MD Initiated Contact with Patient 11/04/13 2101     Chief Complaint  Patient presents with  . Fall  . Hip Pain     (Consider location/radiation/quality/duration/timing/severity/associated sxs/prior Treatment) The history is provided by the patient.  Joanna [name] is a 80 y.o. female hx of GERD, HTN here with fall. Yesterday night she slipped and fell on the left hip. Hit her head as well but denies any headache. She is not on blood thinners and noticed a bruise in the left hip area. She had a left hip replacement a year ago. He was able to ambulate. Denies any headaches or vomiting.     Past Medical History  Diagnosis Date  . IBS (irritable bowel syndrome)   . Hypothyroid   . GERD (gastroesophageal reflux disease)   . Hypertension   . Allergic rhinitis   . Asthma   . Complication of anesthesia     confusion after 04/2012   Past Surgical History  Procedure Laterality Date  . Mastectomy      double  . Bladder surgery    . Nasal sinus surgery    . Hip arthroplasty Left 05/04/2012    Procedure: ARTHROPLASTY BIPOLAR HIP;  Surgeon: Mauri Pole, MD;  Location: [facility];  Service: Orthopedics;  Laterality: Left;  . Closed reduction wrist fracture Left 05/04/2012    Procedure: CLOSED REDUCTION WRIST;  Surgeon: Mauri Pole, MD;  Location: [facility];  Service: Orthopedics;  Laterality: Left;  with casting  . Open reduction internal fixation (orif) distal radial fracture Left 05/29/2012    Procedure: OPEN REDUCTION INTERNAL FIXATION (ORIF) DISTAL RADIAL FRACTURE;  Surgeon: Linna Hoff, MD;  Location: [place];  Service: Orthopedics;  Laterality: Left;  . Foot surgery    . Abdominal hysterectomy     Family History  Problem Relation Age of Onset  . CAD Mother   . Hypertension Father   . Breast cancer Sister    History  Substance Use Topics  . Smoking status: Never Smoker   . Smokeless tobacco: Never Used  .  Alcohol Use: No   OB History   Grav Para Term Preterm Abortions TAB SAB Ect Mult Living                 Review of Systems  Musculoskeletal:       L hip pain   All other systems reviewed and are negative.     Allergies  Cefuroxime axetil; Lactose intolerance (gi); Clarithromycin; Codeine; and Sulfonamide derivatives  Home Medications   Prior to Admission medications   Medication Sig Start Date End Date Taking? Authorizing Provider  albuterol (PROAIR HFA) 108 (90 BASE) MCG/ACT inhaler Inhale 2 puffs into the lungs every 6 (six) hours as needed for wheezing or shortness of breath.    Yes Historical Provider, MD  b complex vitamins tablet Take 1 tablet by mouth every morning.   Yes Historical Provider, MD  cholecalciferol (VITAMIN D) 1000 UNITS tablet Take 1,000 Units by mouth daily.   Yes Historical Provider, MD  fish oil-omega-3 fatty acids 1000 MG capsule Take 2 g by mouth daily.   Yes Historical Provider, MD  gabapentin (NEURONTIN) 300 MG capsule Take 2 capsules by mouth  daily 09/03/13  Yes Elayne Snare, MD  levothyroxine (SYNTHROID) 125 MCG tablet Take 75 mcg by mouth daily before breakfast. DAW 03/31/13  Yes Elayne Snare, MD  losartan (  COZAAR) 50 MG tablet Take 1 tablet (50 mg total) by mouth every evening. 09/03/13  Yes Elayne Snare, MD  metoprolol (LOPRESSOR) 50 MG tablet Take 1 tablet (50 mg total) by mouth every evening. 09/03/13  Yes Elayne Snare, MD  Multiple Vitamin (MULTIVITAMIN WITH MINERALS) TABS Take 1 tablet by mouth every morning.   Yes Historical Provider, MD  naphazoline-pheniramine (NAPHCON-A) 0.025-0.3 % ophthalmic solution Place 2 drops into both eyes 4 (four) times daily as needed for irritation or allergies.   Yes Historical Provider, MD  nortriptyline (PAMELOR) 25 MG capsule Take 1 capsule (25 mg total) by mouth at bedtime as needed for sleep. 10/05/13  Yes Elayne Snare, MD  omeprazole (PRILOSEC) 20 MG capsule Take 1 capsule by mouth  every morning 09/03/13  Yes Elayne Snare,  MD  oxazepam (SERAX) 10 MG capsule Take 10 mg by mouth at bedtime as needed for sleep or anxiety.   Yes Historical Provider, MD  simvastatin (ZOCOR) 20 MG tablet Take 1 tablet by mouth  every evening 09/03/13  Yes Elayne Snare, MD  Spacer/Aero-Holding Chambers (AEROCHAMBER Z-STAT PLUS) inhaler Use as instructed 10/28/13  Yes Kathee Delton, MD  vitamin C (ASCORBIC ACID) 500 MG tablet Take 500 mg by mouth daily.   Yes Historical Provider, MD  omalizumab Arvid Right) 150 MG injection Inject 150 mg into the skin every 28 (twenty-eight) days. Administered by Dr. Gwenette Greet [phone]    Historical Provider, MD  zoledronic acid (RECLAST) 5 MG/100ML SOLN injection Inject 100 mLs (5 mg total) into the vein once. 01/23/13   Elayne Snare, MD   BP 159/69  Pulse 73  Temp(Src) 97.7 F (36.5 C) (Oral)  Resp 18  SpO2 99% Physical Exam  Nursing note and vitals reviewed. Constitutional: She is oriented to person, place, and time.  Chronically ill, NAD   HENT:  Head: Normocephalic and atraumatic.  Mouth/Throat: Oropharynx is clear and moist.  Eyes: Conjunctivae are normal. Pupils are equal, round, and reactive to light.  Neck: Normal range of motion. Neck supple.  Cardiovascular: Normal rate, regular rhythm and normal heart sounds.   Pulmonary/Chest: Effort normal and breath sounds normal. No respiratory distress. She has no wheezes. She has no rales.  Abdominal: Soft. Bowel sounds are normal. She exhibits no distension. There is no tenderness. There is no rebound and no guarding.  Musculoskeletal: Normal range of motion.  L hip with bruise, nl ROM. 2+ pulses. Able to bear weight. Nl Gait   Neurological: She is alert and oriented to person, place, and time. Coordination normal.  Skin: Skin is warm and dry.  Psychiatric: She has a normal mood and affect. Her behavior is normal. Judgment and thought content normal.    ED Course  Procedures (including critical care time) Labs Review Labs Reviewed - No data to  display  Imaging Review Dg Hip 1 View Left  11/04/2013   CLINICAL DATA:  [place] yesterday with persistent left hip pain  EXAM: LEFT HIP - 1 VIEW:  COMPARISON:  None.  FINDINGS: There are changes consistent with a left hip prosthesis. No loosening is identified. No acute fracture or dislocation is seen on this limited single view.  IMPRESSION: No acute abnormality is noted.   Electronically Signed   By: Inez Catalina M.D.   On: 11/04/2013 21:22     EKG Interpretation None      MDM   Final diagnoses:  None   Joanna [name] is a 80 y.o. female here with fall. Had head  and L hip injury. Not on blood thinners. No obvious scalp hematoma and nl neuro exam so CT head not necessary. Xray l hip showed no fracture. Able to walk on it. I doubt occult fracture. Will d/c home with prn motrin, tramadol.     Wandra Arthurs, MD 11/04/13 [phone]

## 2013-11-04 NOTE — Discharge Instructions (Signed)
Take motrin for pain.   Take tramadol as needed for severe pain.   Follow up with your orthopedic doctor.   Return to ER if you have severe pain, unable to walk, headache, vomiting.

## 2013-11-04 NOTE — ED Notes (Signed)
X-Ray at bedside.

## 2013-11-06 ENCOUNTER — Telehealth: Payer: Self-pay | Admitting: Endocrinology

## 2013-11-06 NOTE — Telephone Encounter (Signed)
DATES of certification: 3/82/50 through 10/29/13  Need for home health: Patient has history of cellulitis of her leg and local wound. Patient needs skilled nursing visits for this certification period for the following reasons: Wound care, monitoring blood pressure, increasing mobility  Face-to-face encounter was performed on 09/30/13   Homebound status and reason: The patient is homebound because of Leg wound and inability to drive with this  Plan: The patient's home health certification was reviewed in detail. The diagnoses, medications, prognosis, goals and orders were reviewed and modifications made to correct errors and update information in the plan of care.

## 2013-11-12 DIAGNOSIS — L650 Telogen effluvium: Secondary | ICD-10-CM | POA: Diagnosis not present

## 2013-11-24 ENCOUNTER — Telehealth: Payer: Self-pay | Admitting: Pulmonary Disease

## 2013-11-24 NOTE — Telephone Encounter (Signed)
Spoke with the pt  She was given Dulera 200 to try at her last ov on 10/28/13  She states that the med is working well, but requests another sample  She can not recall if we gave her 1 or 2 inhalers  I advised that we can ask for another sample from [facility], but she needs to understand that we will not be able to continue to give samples every month  She understands, but still asks "for just 1 more" [place]- please advise thanks!

## 2013-11-25 MED ORDER — MOMETASONE FURO-FORMOTEROL FUM 200-5 MCG/ACT IN AERO: 2.0000 | INHALATION_SPRAY | Freq: Two times a day (BID) | RESPIRATORY_TRACT | Status: DC

## 2013-11-25 NOTE — Telephone Encounter (Signed)
Ok to give her dulera sample, but she may also benefit from filling out paperwork for the patient assistance program??

## 2013-11-25 NOTE — Telephone Encounter (Signed)
Pt is aware that 1 sample of Dulera 200/5 at front with Patient Assistance forms to complete and return to [facility] nurse to send to [employer]. Nothing more needed at this time.

## 2013-12-17 ENCOUNTER — Telehealth: Payer: Self-pay | Admitting: Endocrinology

## 2013-12-17 NOTE — Telephone Encounter (Signed)
DATES of certification: 5/49/82 through 10/29/13  Need for home health: Patient has history of cellulitis of her leg requiring admission. Patient needs skilled nursing visits for this certification period for the following reasons: Follow-up of leg infection, local dressings, supportive therapy, monitoring blood pressure and physical therapy as needed Face-to-face encounter was performed on 09/30/13  Homebound status and reason: The patient is homebound because of Inability to ambulate because of cellulitis and leg wound  Plan: The patient's home health certification was reviewed in detail. The diagnoses, medications, prognosis, goals and orders were reviewed and modifications made to correct errors and update information in the plan of care.

## 2013-12-30 ENCOUNTER — Encounter: Payer: Self-pay | Admitting: Endocrinology

## 2013-12-30 ENCOUNTER — Ambulatory Visit (INDEPENDENT_AMBULATORY_CARE_PROVIDER_SITE_OTHER): Payer: Medicare Other | Admitting: Endocrinology

## 2013-12-30 VITALS — BP 132/62 | HR 79 | Temp 98.5°F | Resp 14 | Ht 67.0 in | Wt 125.2 lb

## 2013-12-30 DIAGNOSIS — R634 Abnormal weight loss: Secondary | ICD-10-CM

## 2013-12-30 DIAGNOSIS — E871 Hypo-osmolality and hyponatremia: Secondary | ICD-10-CM

## 2013-12-30 DIAGNOSIS — E041 Nontoxic single thyroid nodule: Secondary | ICD-10-CM

## 2013-12-30 NOTE — Progress Notes (Signed)
Patient ID: Joanna [name], female   DOB: 25-Mar-1928, 80 y.o.   MRN: [id number]    Chief complaint: Multiple problems   History of Present Illness:   1. She has lost a significant amount of weight this year although not continuously and weight appears stable in the last 2 months.  She complains of altered taste and does not like certain foods that she used to like before. No GI problems like abdominal pain, change in bowel habits or nausea , eating irregularly at times.  Does not eats out  Because of the expense and she says she does not get that time to do her cooking and may skip meals at times. Tried Boost but was told by her friend that she should not use it because of high sugar content.  Also cannot complete the whole container at a time because of fullness and nausea. Does not think she is depressed   Wt Readings from Last 3 Encounters:  12/30/13 125 lb 3.2 oz (56.79 kg)  10/28/13 126 lb 12.8 oz (57.516 kg)  09/30/13 130 lb 6.4 oz (59.149 kg)     2. Mild hyponatremia: She has had asymptomatic idiopathic hyponatremia at times.  More recently this has been relatively mild, probably asymptomatic  3.  She is asking about a new swelling which is painless on her foot near the right ankle.  She thinks it has been present for the last 2 months and does not think it has grown  4. Has history of hypothyroidism, mild and TSH has been consistently normal on 62.5 mcg daily. No unusual fatigue    Lab Results  Component Value Date   TSH 1.17 09/30/2013   5.  She is asking about management of osteoporosis and this is outlined in the review of systems  6. HYPERTENSION: She has had mild to moderate hypertension with usually good control and requiring small doses of medications. She on metoprolol and losartan.    7.  Her LDL was significantly high on her last visit from noncompliance but she thinks she is taking her simvastatin more regularly now without any side effects      Medication List        This list is accurate as of: 12/30/13  1:16 PM.  Always use your most recent med list.               AEROCHAMBER Z-STAT PLUS inhaler  Use as instructed     b complex vitamins tablet  Take 1 tablet by mouth every morning.     betamethasone dipropionate 0.05 % lotion     cholecalciferol 1000 UNITS tablet  Commonly known as:  VITAMIN D  Take 1,000 Units by mouth daily.     fish oil-omega-3 fatty acids 1000 MG capsule  Take 2 g by mouth daily.     gabapentin 300 MG capsule  Commonly known as:  NEURONTIN  Take 2 capsules by mouth  daily     losartan 50 MG tablet  Commonly known as:  COZAAR  Take 1 tablet (50 mg total) by mouth every evening.     metoprolol 50 MG tablet  Commonly known as:  LOPRESSOR  Take 1 tablet (50 mg total) by mouth every evening.     mometasone-formoterol 200-5 MCG/ACT Aero  Commonly known as:  DULERA  Inhale 2 puffs into the lungs 2 (two) times daily.     multivitamin with minerals Tabs tablet  Take 1 tablet by mouth every morning.  naphazoline-pheniramine 0.025-0.3 % ophthalmic solution  Commonly known as:  NAPHCON-A  Place 2 drops into both eyes 4 (four) times daily as needed for irritation or allergies.     nortriptyline 25 MG capsule  Commonly known as:  PAMELOR  Take 1 capsule (25 mg total) by mouth at bedtime as needed for sleep.     omalizumab 150 MG injection  Commonly known as:  XOLAIR  Inject 150 mg into the skin every 28 (twenty-eight) days. Administered by Dr. Gwenette Greet [phone]     omeprazole 20 MG capsule  Commonly known as:  PRILOSEC  Take 1 capsule by mouth  every morning     oxazepam 10 MG capsule  Commonly known as:  SERAX  Take 10 mg by mouth at bedtime as needed for sleep or anxiety.     PROAIR HFA 108 (90 BASE) MCG/ACT inhaler  Generic drug:  albuterol  Inhale 2 puffs into the lungs every 6 (six) hours as needed for wheezing or shortness of breath.     simvastatin 20 MG tablet  Commonly known as:   ZOCOR  Take 1 tablet by mouth  every evening     SYNTHROID 125 MCG tablet  Generic drug:  levothyroxine  Take 75 mcg by mouth daily before breakfast. DAW     traMADol 50 MG tablet  Commonly known as:  ULTRAM  Take 1 tablet (50 mg total) by mouth every 6 (six) hours as needed.     vitamin C 500 MG tablet  Commonly known as:  ASCORBIC ACID  Take 500 mg by mouth daily.     zoledronic acid 5 MG/100ML Soln injection  Commonly known as:  RECLAST  Inject 100 mLs (5 mg total) into the vein once.        Allergies:  Allergies  Allergen Reactions  . Cefuroxime Axetil Hives    Not sure.. Patient can be allergic to it today but a year from now she may not be.  . Lactose Intolerance (Gi) Other (See Comments)    Gi upset  . Clarithromycin Palpitations  . Codeine Palpitations  . Sulfonamide Derivatives Palpitations    Past Medical History  Diagnosis Date  . IBS (irritable bowel syndrome)   . Hypothyroid   . GERD (gastroesophageal reflux disease)   . Hypertension   . Allergic rhinitis   . Asthma   . Complication of anesthesia     confusion after 04/2012    Past Surgical History  Procedure Laterality Date  . Mastectomy      double  . Bladder surgery    . Nasal sinus surgery    . Hip arthroplasty Left 05/04/2012    Procedure: ARTHROPLASTY BIPOLAR HIP;  Surgeon: Mauri Pole, MD;  Location: [facility];  Service: Orthopedics;  Laterality: Left;  . Closed reduction wrist fracture Left 05/04/2012    Procedure: CLOSED REDUCTION WRIST;  Surgeon: Mauri Pole, MD;  Location: [facility];  Service: Orthopedics;  Laterality: Left;  with casting  . Open reduction internal fixation (orif) distal radial fracture Left 05/29/2012    Procedure: OPEN REDUCTION INTERNAL FIXATION (ORIF) DISTAL RADIAL FRACTURE;  Surgeon: Linna Hoff, MD;  Location: [place];  Service: Orthopedics;  Laterality: Left;  . Foot surgery    . Abdominal hysterectomy      Family History  Problem Relation Age of Onset  . CAD  Mother   . Hypertension Father   . Breast cancer Sister     Social History:  reports that  she has never smoked. She has never used smokeless tobacco. She reports that she does not drink alcohol or use illicit drugs.  Review of Systems    History of osteoporosis/osteopenia. She had a bone density in 2014 which showed osteopenia with T score -2.3. She had refused to take Fosamax. She did get a Reclast infusion without any side effects in 01/2013 She is taking her calcium and vitamin D preparation as well as 1000 units of vitamin D 3. Vitamin D level is normal  Had  multiple fractures from fall in 2014 Bone density 12/14 showed Osteopenia. Lowest T score of -2.3 at the right femoral neck.  Has had COPD followed by pulmonologist   She has a history of hypercholesterolemia and has been taking her simvastatin regularly with adequate control  Lab Results  Component Value Date   CHOL 228* 09/30/2013   HDL 64.80 09/30/2013   LDLCALC 150* 09/30/2013   LDLDIRECT 93.8 11/05/2012   TRIG 67.0 09/30/2013   CHOLHDL 4 09/30/2013    History of chronic insomnia. She has tried various hypnotics for this and lately has been taking oxazepam  Exam:  BP 132/62 mmHg  Pulse 79  Temp(Src) 98.5 F (36.9 C)  Resp 14  Ht 5\' 7"  (1.702 m)  Wt 125 lb 3.2 oz (56.79 kg)  BMI 19.60 kg/m2  SpO2 98%  No pallor No lymphadenopathy in her neck She appears to have a 2 cm smooth and firm nodule in the isthmus of the thyroid, mobile on swallowing.  No other mass in the thyroid felt. Heart sounds normal Lungs clear Abdomen shows no hepatosplenomegaly or mass No ankle edema,   The proximal right foot has a 2 cm smooth and firm nodule which is slightly fluctuant on pressure superiorly and this is under the skin, appears fixed to the bone  Assessment/Plan:   Multiple problems as detailed in history of present illness  Her main issue appears to be weight loss of unclear etiology and will evaluate this with  labs today including thyroid functions and also will need to rule out hyponatremia  THYROID nodule: This appears to be relatively new.  Will get ultrasound, discussed implications of her nodules but will discuss management after ultrasound is done  Encouraged her to start using Ensure or boost daily and have a can over the course of her day and not necessarily at one time  Mass on the right foot appears to be a cyst and is not a bony swelling.  Will continue to monitor this   Long-standing peripheral neuropathy: Appears to be having only mild symptoms which are controlled with gabapentin  Osteopenia with history of fracture: She had Reclast this year and will repeat in January, to discuss on the next visit.  Informed her that she does not need a bone density for at least another year and may not be necessary since she is on treatment  [facility]

## 2013-12-30 NOTE — Patient Instructions (Signed)
Use Boost or Ensure daily

## 2013-12-31 LAB — COMPREHENSIVE METABOLIC PANEL
ALK PHOS: 47 U/L (ref 39–117)
ALT: 19 U/L (ref 0–35)
AST: 28 U/L (ref 0–37)
Albumin: 4 g/dL (ref 3.5–5.2)
BILIRUBIN TOTAL: 0.6 mg/dL (ref 0.2–1.2)
BUN: 23 mg/dL (ref 6–23)
CO2: 27 mEq/L (ref 19–32)
Calcium: 9.6 mg/dL (ref 8.4–10.5)
Chloride: 96 mEq/L (ref 96–112)
Creatinine, Ser: 0.9 mg/dL (ref 0.4–1.2)
GFR: 61.15 mL/min (ref >60.00)
GLUCOSE: 110 mg/dL — AB (ref 70–99)
Potassium: 4.3 mEq/L (ref 3.5–5.1)
SODIUM: 131 meq/L — AB (ref 135–145)
TOTAL PROTEIN: 6.6 g/dL (ref 6.0–8.3)

## 2013-12-31 LAB — CBC WITH DIFFERENTIAL/PLATELET
BASOS PCT: 0.3 % (ref 0.0–3.0)
Basophils Absolute: 0 10*3/uL (ref 0.0–0.1)
EOS PCT: 0.8 % (ref 0.0–5.0)
Eosinophils Absolute: 0.1 10*3/uL (ref 0.0–0.7)
HCT: 39 % (ref 36.0–46.0)
Hemoglobin: 12.7 g/dL (ref 12.0–15.0)
Lymphocytes Relative: 27.5 % (ref 12.0–46.0)
Lymphs Abs: 2.2 10*3/uL (ref 0.7–4.0)
MCHC: 32.6 g/dL (ref 30.0–36.0)
MCV: 88.8 fl (ref 78.0–100.0)
MONO ABS: 0.1 10*3/uL (ref 0.1–1.0)
Monocytes Relative: 1.7 % — ABNORMAL LOW (ref 3.0–12.0)
NEUTROS ABS: 5.5 10*3/uL (ref 1.4–7.7)
Neutrophils Relative %: 69.7 % (ref 43.0–77.0)
PLATELETS: 290 10*3/uL (ref 150.0–400.0)
RBC: 4.39 Mil/uL (ref 3.87–5.11)
RDW: 13.9 % (ref 11.5–15.5)
WBC: 7.9 10*3/uL (ref 4.0–10.5)

## 2013-12-31 LAB — TSH: TSH: 1.38 u[IU]/mL (ref 0.35–4.50)

## 2014-01-11 ENCOUNTER — Ambulatory Visit: Payer: Medicare Other | Admitting: Endocrinology

## 2014-01-12 ENCOUNTER — Telehealth: Payer: Self-pay | Admitting: Endocrinology

## 2014-01-12 NOTE — Telephone Encounter (Signed)
Patient needs to speak with Joanna [name] to go over her medications She is confused; she does not know what she needs refills on and what she doesn't   Also she is questioning about a reclast injection   Patient states this is very important she speaks with Joanna [name] today   Please advise patient   Thank you

## 2014-01-12 NOTE — Telephone Encounter (Signed)
Noted,  Thank you!

## 2014-01-12 NOTE — Telephone Encounter (Signed)
Pt instructed to call her pharmacy and have them send the RX for what she needs refilled

## 2014-01-14 ENCOUNTER — Encounter (HOSPITAL_BASED_OUTPATIENT_CLINIC_OR_DEPARTMENT_OTHER): Payer: Medicare Other | Attending: Internal Medicine

## 2014-01-14 DIAGNOSIS — I87332 Chronic venous hypertension (idiopathic) with ulcer and inflammation of left lower extremity: Secondary | ICD-10-CM | POA: Insufficient documentation

## 2014-01-14 DIAGNOSIS — L97929 Non-pressure chronic ulcer of unspecified part of left lower leg with unspecified severity: Secondary | ICD-10-CM | POA: Insufficient documentation

## 2014-01-15 NOTE — Progress Notes (Signed)
[facility]  NAME:  Joanna [name], Joanna [name]                ACCOUNT NO.:  [id number]  MEDICAL RECORD NO.:  [id number]      DATE OF BIRTH:  1928/01/24  PHYSICIAN:  Ricard Dillon, M.D. VISIT DATE:  01/14/2014                                  OFFICE VISIT   This is a patient who comes in with a wound on her left lower leg.  She was previously seen here earlier this year at which time, she has suffered an abrasion and laceration to her left lower extremity after a trash container fell on her leg in July.  She developed drainage from her wounds and possibly some cellulitis.  Subsequently, proper antibiotic therapy, she had  collagen dressings.  She appears to have been discharged by October.  The patient arrives today with a history of a "stinging" sensation on the left anterior leg and roughly the same position as her last wound, this was going on for perhaps 2-3 weeks.  Roughly 2-3 days ago, she developed a small open area without obvious trauma, although she was working on her porch sweeping when she first noticed this.  She has noted increased tenderness and is here for our review of this.  On examination, temperature is 98.5, pulse 70, respirations 20, blood pressure 159/69.  The area in question is over her left anterior leg measuring 1.3 x 1.7 x 0.1.  Medially and inferiorly to the wound, there is some tenderness and erythema.  I note that previously Dr. Iran Planas made reference to chronic erythema in this area.  The differentiation between cellulitis and erythema associated with stasis dermatitis is not always an easy clinical determination.  IMPRESSION:  Venous stasis wound left anterior leg in the setting of venous hypertension and chronic inflammation.  We dressed the wound with collagen, foam, and a Kerlix wrap.  I put her on doxycycline noting that she is allergic to cephalosporins, erythromycin, clarithromycin, and sulfa.  I counseled her to watch the  dimensions of the erythema and to seek medical attention if the situation worsens.  The history has suggested that this may have been a primary cellulitis first before the opening of the wound.  Resurgence of active stasis dermatitis is also a possible cause.          ______________________________ Ricard Dillon, M.D.     MGR/MEDQ  D:  01/14/2014  T:  01/15/2014  Job:  [id number]

## 2014-01-21 ENCOUNTER — Other Ambulatory Visit: Payer: Self-pay | Admitting: *Deleted

## 2014-01-21 ENCOUNTER — Encounter (HOSPITAL_BASED_OUTPATIENT_CLINIC_OR_DEPARTMENT_OTHER): Payer: Medicare Other | Attending: Internal Medicine

## 2014-01-21 DIAGNOSIS — I87332 Chronic venous hypertension (idiopathic) with ulcer and inflammation of left lower extremity: Secondary | ICD-10-CM | POA: Insufficient documentation

## 2014-01-21 DIAGNOSIS — L97929 Non-pressure chronic ulcer of unspecified part of left lower leg with unspecified severity: Secondary | ICD-10-CM | POA: Diagnosis not present

## 2014-01-21 MED ORDER — METOPROLOL TARTRATE 50 MG PO TABS: ORAL_TABLET | ORAL | Status: DC

## 2014-01-21 MED ORDER — LEVOTHYROXINE SODIUM 125 MCG PO TABS: 125.0000 ug | ORAL_TABLET | Freq: Every day | ORAL | Status: DC

## 2014-01-22 ENCOUNTER — Other Ambulatory Visit: Payer: Medicare Other

## 2014-01-25 ENCOUNTER — Telehealth: Payer: Self-pay | Admitting: Endocrinology

## 2014-01-25 ENCOUNTER — Other Ambulatory Visit: Payer: Self-pay | Admitting: *Deleted

## 2014-01-25 MED ORDER — LOSARTAN POTASSIUM 50 MG PO TABS: ORAL_TABLET | ORAL | Status: DC

## 2014-01-25 MED ORDER — SIMVASTATIN 20 MG PO TABS: ORAL_TABLET | ORAL | Status: DC

## 2014-01-25 NOTE — Telephone Encounter (Signed)
Patient states she needs refills on her medication   Losartan 50 mg Simvastatin 20 mg    Please send a refill to losartan to [facility]    Thank you

## 2014-01-28 NOTE — Telephone Encounter (Signed)
Patient would like for you to call her concerning her medication.

## 2014-02-01 ENCOUNTER — Telehealth: Payer: Self-pay | Admitting: Endocrinology

## 2014-02-01 NOTE — Telephone Encounter (Signed)
Please call pt to explain her meds to her. She is confused by what she is supposed to have and what she is not supposed to have.

## 2014-02-03 ENCOUNTER — Encounter: Payer: Self-pay | Admitting: Endocrinology

## 2014-02-03 ENCOUNTER — Ambulatory Visit (INDEPENDENT_AMBULATORY_CARE_PROVIDER_SITE_OTHER): Payer: Medicare Other | Admitting: Endocrinology

## 2014-02-03 VITALS — BP 124/74 | HR 76 | Temp 98.4°F | Wt 126.2 lb

## 2014-02-03 DIAGNOSIS — E7800 Pure hypercholesterolemia, unspecified: Secondary | ICD-10-CM

## 2014-02-03 DIAGNOSIS — I10 Essential (primary) hypertension: Secondary | ICD-10-CM

## 2014-02-03 DIAGNOSIS — M810 Age-related osteoporosis without current pathological fracture: Secondary | ICD-10-CM

## 2014-02-03 DIAGNOSIS — E039 Hypothyroidism, unspecified: Secondary | ICD-10-CM | POA: Diagnosis not present

## 2014-02-03 DIAGNOSIS — R634 Abnormal weight loss: Secondary | ICD-10-CM | POA: Diagnosis not present

## 2014-02-03 MED ORDER — GABAPENTIN 300 MG PO CAPS: ORAL_CAPSULE | ORAL | Status: DC

## 2014-02-03 MED ORDER — METOPROLOL SUCCINATE ER 50 MG PO TB24: 50.0000 mg | ORAL_TABLET | Freq: Every day | ORAL | Status: DC

## 2014-02-03 NOTE — Patient Instructions (Addendum)
Omperazole as needed only (purple)

## 2014-02-03 NOTE — Telephone Encounter (Signed)
Pt has an appt with Dr, Dwyane Dee on 1.20.2016 and her medication list will be updated.

## 2014-02-03 NOTE — Progress Notes (Signed)
Pre visit review using our clinic review tool, if applicable. No additional management support is needed unless otherwise documented below in the visit note. 

## 2014-02-03 NOTE — Progress Notes (Signed)
Patient ID: Joanna [name], female   DOB: 1928/06/18, 81 y.o.   MRN: [id number]    Chief complaint: Multiple problems, medication review   History of Present Illness:   1.  She is totally confused about what medications she needs to be taking and when.  She is concerned about the same medication looking different from refill to refill.  Also appears to have 2 bottles of some of the medications and does not know which one to take.  She is asking me to identify her white tablets in her pillbox.  Also asking about Prilosec having a different color. She does appear to be taking most of her medications correctly. Also explained to her that all her medications do have refilled except her gabapentin She is however getting metoprolol tartrate instead of the Toprol-XL that she is supposed to take. Today have  written all the dictations 4 different drugs on the medication bottles   2.  She had lost a significant amount of weight in 2015 but it appears to have leveled off. She thinks that her tests have changed and she does not like some of the foods that she usually would like to eat  Wt Readings from Last 3 Encounters:  02/03/14 126 lb 3.2 oz (57.244 kg)  12/30/13 125 lb 3.2 oz (56.79 kg)  10/28/13 126 lb 12.8 oz (57.516 kg)     3.  Has history of hypothyroidism, mild and TSH has been consistently normal on 62.5 mcg daily. No unusual fatigue. She is taking the brand name Synthroid now    Lab Results  Component Value Date   TSH 1.38 12/30/2013    4. HYPERTENSION: She has had long-standing hypertension with usually good control and requiring small doses of medications. She on metoprolol and losartan.          Medication List       This list is accurate as of: 02/03/14 10:30 AM.  Always use your most recent med list.               AEROCHAMBER Z-STAT PLUS inhaler  Use as instructed     b complex vitamins tablet  Take 1 tablet by mouth every morning.     betamethasone  dipropionate 0.05 % lotion     cholecalciferol 1000 UNITS tablet  Commonly known as:  VITAMIN D  Take 1,000 Units by mouth daily.     fish oil-omega-3 fatty acids 1000 MG capsule  Take 2 g by mouth daily.     gabapentin 300 MG capsule  Commonly known as:  NEURONTIN  Take 2 capsules by mouth  daily     levothyroxine 125 MCG tablet  Commonly known as:  SYNTHROID  Take 1 tablet (125 mcg total) by mouth daily before breakfast. DAW take 1/2 tablet daily     losartan 50 MG tablet  Commonly known as:  COZAAR  Take 1 tablet (50 mg total) by mouth every evening.     metoprolol 50 MG tablet  Commonly known as:  LOPRESSOR  Take 1 tablet (50 mg total) by mouth every evening.     mometasone-formoterol 200-5 MCG/ACT Aero  Commonly known as:  DULERA  Inhale 2 puffs into the lungs 2 (two) times daily.     multivitamin with minerals Tabs tablet  Take 1 tablet by mouth every morning.     naphazoline-pheniramine 0.025-0.3 % ophthalmic solution  Commonly known as:  NAPHCON-A  Place 2 drops into both eyes 4 (four) times daily as  needed for irritation or allergies.     omalizumab 150 MG injection  Commonly known as:  XOLAIR  Inject 150 mg into the skin every 28 (twenty-eight) days. Administered by Dr. Gwenette Greet [id number]) [phone]     omeprazole 20 MG capsule  Commonly known as:  PRILOSEC  Take 1 capsule by mouth  every morning     oxazepam 10 MG capsule  Commonly known as:  SERAX  Take 10 mg by mouth at bedtime as needed for sleep or anxiety.     PROAIR HFA 108 (90 BASE) MCG/ACT inhaler  Generic drug:  albuterol  Inhale 2 puffs into the lungs every 6 (six) hours as needed for wheezing or shortness of breath.     simvastatin 20 MG tablet  Commonly known as:  ZOCOR  Take 1 tablet by mouth  every evening     vitamin C 500 MG tablet  Commonly known as:  ASCORBIC ACID  Take 500 mg by mouth daily.     zoledronic acid 5 MG/100ML Soln injection  Commonly known as:  RECLAST  Inject 100 mLs (5  mg total) into the vein once.        Allergies:  Allergies  Allergen Reactions  . Cefuroxime Axetil Hives    Not sure.. Patient can be allergic to it today but a year from now she may not be.  . Lactose Intolerance (Gi) Other (See Comments)    Gi upset  . Clarithromycin Palpitations  . Codeine Palpitations  . Sulfonamide Derivatives Palpitations    Past Medical History  Diagnosis Date  . IBS (irritable bowel syndrome)   . Hypothyroid   . GERD (gastroesophageal reflux disease)   . Hypertension   . Allergic rhinitis   . Asthma   . Complication of anesthesia     confusion after 04/2012    Past Surgical History  Procedure Laterality Date  . Mastectomy      double  . Bladder surgery    . Nasal sinus surgery    . Hip arthroplasty Left 05/04/2012    Procedure: ARTHROPLASTY BIPOLAR HIP;  Surgeon: Mauri Pole, MD;  Location: [facility];  Service: Orthopedics;  Laterality: Left;  . Closed reduction wrist fracture Left 05/04/2012    Procedure: CLOSED REDUCTION WRIST;  Surgeon: Mauri Pole, MD;  Location: [facility];  Service: Orthopedics;  Laterality: Left;  with casting  . Open reduction internal fixation (orif) distal radial fracture Left 05/29/2012    Procedure: OPEN REDUCTION INTERNAL FIXATION (ORIF) DISTAL RADIAL FRACTURE;  Surgeon: Linna Hoff, MD;  Location: [facility];  Service: Orthopedics;  Laterality: Left;  . Foot surgery    . Abdominal hysterectomy      Family History  Problem Relation Age of Onset  . CAD Mother   . Hypertension Father   . Breast cancer Sister     Social History:  reports that she has never smoked. She has never used smokeless tobacco. She reports that she does not drink alcohol or use illicit drugs.  Review of Systems    History of osteoporosis/osteopenia. She had a bone density in 2014 which showed osteopenia with T score -2.3. She had refused to take Fosamax. She did get a Reclast infusion without any side effects in 01/2013 She is taking her  calcium and vitamin D preparation as well as 1000 units of vitamin D 3. Vitamin D level is normal  Had  multiple fractures from fall in 2014 Bone density 12/14 showed Osteopenia. Lowest T score  of -2.3 at the right femoral neck. She is a 1 year out from her last infusion of Reclast.  Explained to her that she needs another one because of her history of multiple fractures   Has had COPD followed by pulmonologist   She has a history of hypercholesterolemia and has been taking her simvastatin regularly with adequate control  Lab Results  Component Value Date   CHOL 228* 09/30/2013   HDL 64.80 09/30/2013   LDLCALC 150* 09/30/2013   LDLDIRECT 93.8 11/05/2012   TRIG 67.0 09/30/2013   CHOLHDL 4 09/30/2013    History of chronic insomnia. She has tried various hypnotics for this and had been taking oxazepam. Now takes nortriptyline especially if she is having a headache towards bedtime with relief  Exam:  BP 130/84 mmHg  Temp(Src) 98.4 F (36.9 C) (Oral)  Wt 126 lb 3.2 oz (57.244 kg)  No pallor  Assessment/Plan:   Multiple problems as detailed in history of present illness and review of systems  Explained to her the medication regimen she is taking and what they are for Reviewed the medication regimen and advised her to continue all medications as directed She can take Prilosec only as needed  She will be given new prescription for gabapentin and has refills on all other prescriptions She will be given help with her pillbox by this certified medical assistant today She will avoid duplication of her medications and explained to her that certain generics may look at different color  Weight loss: This appears to have leveled off.  Her main issue appears to be weight loss of unclear etiology and will evaluate this with labs today including thyroid functions and also will need to rule out hyponatremia  THYROID nodule: She has not gone for her ultrasound and will schedule  this  Osteopenia with history of fracture: She had Reclast in 1/15 and will repeat this now  [facility]

## 2014-02-04 DIAGNOSIS — I87332 Chronic venous hypertension (idiopathic) with ulcer and inflammation of left lower extremity: Secondary | ICD-10-CM | POA: Diagnosis not present

## 2014-02-04 DIAGNOSIS — L97929 Non-pressure chronic ulcer of unspecified part of left lower leg with unspecified severity: Secondary | ICD-10-CM | POA: Diagnosis not present

## 2014-02-09 ENCOUNTER — Telehealth: Payer: Self-pay | Admitting: Endocrinology

## 2014-02-09 NOTE — Telephone Encounter (Signed)
Pt having to urinate 5-6 times at night. She is having a lot of issues with voiding

## 2014-02-10 NOTE — Telephone Encounter (Signed)
She needs to discuss this with her urologist who knows the problem better

## 2014-02-10 NOTE — Telephone Encounter (Signed)
Please see below.

## 2014-02-18 ENCOUNTER — Encounter (HOSPITAL_BASED_OUTPATIENT_CLINIC_OR_DEPARTMENT_OTHER): Payer: Medicare Other | Attending: Internal Medicine

## 2014-02-18 DIAGNOSIS — I87332 Chronic venous hypertension (idiopathic) with ulcer and inflammation of left lower extremity: Secondary | ICD-10-CM | POA: Insufficient documentation

## 2014-02-18 DIAGNOSIS — L97829 Non-pressure chronic ulcer of other part of left lower leg with unspecified severity: Secondary | ICD-10-CM | POA: Insufficient documentation

## 2014-02-19 ENCOUNTER — Telehealth: Payer: Self-pay | Admitting: Pulmonary Disease

## 2014-02-19 MED ORDER — MOMETASONE FURO-FORMOTEROL FUM 200-5 MCG/ACT IN AERO: 2.0000 | INHALATION_SPRAY | Freq: Two times a day (BID) | RESPIRATORY_TRACT | Status: DC

## 2014-02-19 NOTE — Telephone Encounter (Signed)
Spoke with pt, samples placed up front for pt.  Nothing further needed.

## 2014-02-23 NOTE — Telephone Encounter (Signed)
error 

## 2014-02-24 ENCOUNTER — Other Ambulatory Visit: Payer: Self-pay | Admitting: *Deleted

## 2014-03-02 ENCOUNTER — Other Ambulatory Visit: Payer: Self-pay | Admitting: *Deleted

## 2014-03-02 ENCOUNTER — Telehealth: Payer: Self-pay | Admitting: Endocrinology

## 2014-03-02 MED ORDER — NORTRIPTYLINE HCL 25 MG PO CAPS: 25.0000 mg | ORAL_CAPSULE | Freq: Every day | ORAL | Status: DC

## 2014-03-02 NOTE — Telephone Encounter (Signed)
Please read message below and advise if ok to refill. I did not see in pt's med list, unless the medication has a different name. Please advise in Dr Ronnie Derby absence.

## 2014-03-02 NOTE — Telephone Encounter (Signed)
OK to refill 1 mo

## 2014-03-02 NOTE — Telephone Encounter (Signed)
Patient need refill of Nortriptyline

## 2014-03-02 NOTE — Telephone Encounter (Signed)
Refill sent to [facility].  

## 2014-03-03 ENCOUNTER — Ambulatory Visit: Payer: Medicare Other | Admitting: Endocrinology

## 2014-03-03 ENCOUNTER — Encounter: Payer: Self-pay | Admitting: Pulmonary Disease

## 2014-03-03 ENCOUNTER — Ambulatory Visit (INDEPENDENT_AMBULATORY_CARE_PROVIDER_SITE_OTHER): Payer: Medicare Other | Admitting: Pulmonary Disease

## 2014-03-03 VITALS — BP 130/72 | HR 80 | Temp 97.8°F | Ht 67.0 in | Wt 130.6 lb

## 2014-03-03 DIAGNOSIS — J449 Chronic obstructive pulmonary disease, unspecified: Secondary | ICD-10-CM

## 2014-03-03 MED ORDER — MOMETASONE FURO-FORMOTEROL FUM 200-5 MCG/ACT IN AERO: 2.0000 | INHALATION_SPRAY | Freq: Two times a day (BID) | RESPIRATORY_TRACT | Status: DC

## 2014-03-03 NOTE — Progress Notes (Signed)
   Subjective:    Patient ID: Joanna [name], female    DOB: 1928/04/18, 81 y.o.   MRN: [id number]  HPI Patient comes in today for follow-up of her known chronic obstructive asthma. We have changed her maintenance medication to dulera, and it appears that she is doing very well with this. He is also staying on her Xolair injections. She has not had an acute exacerbation or pulmonary infection since the last visit. Feels that her breathing is actually doing better than her usual baseline, but will have good and bad days as expected.   Review of Systems  Constitutional: Negative for fever and unexpected weight change.  HENT: Negative for congestion, dental problem, ear pain, nosebleeds, postnasal drip, rhinorrhea, sinus pressure, sneezing, sore throat and trouble swallowing.   Eyes: Negative for redness and itching.  Respiratory: Positive for cough and shortness of breath. Negative for chest tightness and wheezing.   Cardiovascular: Negative for palpitations and leg swelling.  Gastrointestinal: Negative for nausea and vomiting.  Genitourinary: Negative for dysuria.  Musculoskeletal: Negative for joint swelling.  Skin: Negative for rash.  Neurological: Negative for headaches.  Hematological: Does not bruise/bleed easily.  Psychiatric/Behavioral: Negative for dysphoric mood. The patient is not nervous/anxious.        Objective:   Physical Exam Thin female in no acute distress Nose without purulence or discharge noted Neck without lymphadenopathy or thyromegaly Chest with clear breath sounds, no wheezing Cardiac exam with regular rate and rhythm Lower extremities with minimal edema, changes of vascular insufficiency Alert and oriented, moves all 4 extremities.       Assessment & Plan:

## 2014-03-03 NOTE — Assessment & Plan Note (Signed)
The patient seems to be doing well on her dulera, and I've asked her to continue on this. I will add a spacer to help with medication delivery. I have also asked her to stay on Xolair injections, and to stay as active as possible.

## 2014-03-03 NOTE — Patient Instructions (Signed)
Stay on your dulera 200, taking 2 inhalations am and pm everyday Use spacer with dulera to see if helps with delivery Stay on xolair Stay as active as possible. followup with me again in 63mos

## 2014-03-03 NOTE — Addendum Note (Signed)
Addended by: Inge Rise on: 03/03/2014 02:01 PM   Modules accepted: Orders

## 2014-03-16 DIAGNOSIS — R32 Unspecified urinary incontinence: Secondary | ICD-10-CM | POA: Diagnosis not present

## 2014-03-16 DIAGNOSIS — N3281 Overactive bladder: Secondary | ICD-10-CM | POA: Diagnosis not present

## 2014-03-19 ENCOUNTER — Encounter (HOSPITAL_BASED_OUTPATIENT_CLINIC_OR_DEPARTMENT_OTHER): Payer: Medicare Other | Attending: Internal Medicine

## 2014-03-19 DIAGNOSIS — I87331 Chronic venous hypertension (idiopathic) with ulcer and inflammation of right lower extremity: Secondary | ICD-10-CM | POA: Insufficient documentation

## 2014-03-19 DIAGNOSIS — L97811 Non-pressure chronic ulcer of other part of right lower leg limited to breakdown of skin: Secondary | ICD-10-CM | POA: Insufficient documentation

## 2014-03-19 DIAGNOSIS — I87332 Chronic venous hypertension (idiopathic) with ulcer and inflammation of left lower extremity: Secondary | ICD-10-CM | POA: Diagnosis not present

## 2014-03-26 DIAGNOSIS — L97811 Non-pressure chronic ulcer of other part of right lower leg limited to breakdown of skin: Secondary | ICD-10-CM | POA: Diagnosis not present

## 2014-03-26 DIAGNOSIS — I87331 Chronic venous hypertension (idiopathic) with ulcer and inflammation of right lower extremity: Secondary | ICD-10-CM | POA: Diagnosis not present

## 2014-04-12 ENCOUNTER — Telehealth: Payer: Self-pay | Admitting: Endocrinology

## 2014-04-12 NOTE — Telephone Encounter (Signed)
Patient called and would like a refill on her medication   Rx: Gabapentin (needs more due to taking more)  Pharmacy: [facility]    Thank you

## 2014-04-12 NOTE — Telephone Encounter (Signed)
Okay to send in for more tablets?

## 2014-04-12 NOTE — Telephone Encounter (Signed)
She said her left foot is numb and this is the only medication she can take that helps, she said she takes between 4-6 300 mg most days.  Please advise

## 2014-04-12 NOTE — Telephone Encounter (Signed)
Needs specific information on what dose she is taking and why she is taking more

## 2014-04-13 ENCOUNTER — Other Ambulatory Visit: Payer: Self-pay | Admitting: *Deleted

## 2014-04-13 MED ORDER — GABAPENTIN 300 MG PO CAPS: ORAL_CAPSULE | ORAL | Status: DC

## 2014-04-13 NOTE — Telephone Encounter (Signed)
Ok to Rx 1-2 Caps, 3x daily total 150/30days

## 2014-04-13 NOTE — Telephone Encounter (Signed)
Noted, rx sent, patient aware 

## 2014-04-30 ENCOUNTER — Other Ambulatory Visit (INDEPENDENT_AMBULATORY_CARE_PROVIDER_SITE_OTHER): Payer: Medicare Other

## 2014-04-30 DIAGNOSIS — R634 Abnormal weight loss: Secondary | ICD-10-CM | POA: Diagnosis not present

## 2014-04-30 DIAGNOSIS — E7800 Pure hypercholesterolemia, unspecified: Secondary | ICD-10-CM

## 2014-04-30 DIAGNOSIS — I10 Essential (primary) hypertension: Secondary | ICD-10-CM

## 2014-04-30 DIAGNOSIS — E039 Hypothyroidism, unspecified: Secondary | ICD-10-CM | POA: Diagnosis not present

## 2014-04-30 DIAGNOSIS — M810 Age-related osteoporosis without current pathological fracture: Secondary | ICD-10-CM | POA: Diagnosis not present

## 2014-04-30 LAB — COMPREHENSIVE METABOLIC PANEL
ALK PHOS: 40 U/L (ref 39–117)
ALT: 17 U/L (ref 0–35)
AST: 26 U/L (ref 0–37)
Albumin: 4 g/dL (ref 3.5–5.2)
BILIRUBIN TOTAL: 0.6 mg/dL (ref 0.2–1.2)
BUN: 21 mg/dL (ref 6–23)
CO2: 30 mEq/L (ref 19–32)
Calcium: 10.4 mg/dL (ref 8.4–10.5)
Chloride: 95 mEq/L — ABNORMAL LOW (ref 96–112)
Creatinine, Ser: 1.19 mg/dL (ref 0.40–1.20)
GFR: 45.97 mL/min — AB (ref >60.00)
GLUCOSE: 93 mg/dL (ref 70–99)
Potassium: 3.5 mEq/L (ref 3.5–5.1)
SODIUM: 131 meq/L — AB (ref 135–145)
TOTAL PROTEIN: 6.8 g/dL (ref 6.0–8.3)

## 2014-04-30 LAB — CBC
HEMATOCRIT: 36.9 % (ref 36.0–46.0)
HEMOGLOBIN: 12.6 g/dL (ref 12.0–15.0)
MCHC: 34.1 g/dL (ref 30.0–36.0)
MCV: 86.7 fl (ref 78.0–100.0)
PLATELETS: 275 10*3/uL (ref 150.0–400.0)
RBC: 4.26 Mil/uL (ref 3.87–5.11)
RDW: 14.1 % (ref 11.5–15.5)
WBC: 8.7 10*3/uL (ref 4.0–10.5)

## 2014-04-30 LAB — LIPID PANEL
CHOL/HDL RATIO: 3
Cholesterol: 215 mg/dL — ABNORMAL HIGH (ref 0–200)
HDL: 69.3 mg/dL (ref >39.00)
LDL CALC: 114 mg/dL — AB (ref 0–99)
NonHDL: 145.7
TRIGLYCERIDES: 158 mg/dL — AB (ref 0.0–149.0)
VLDL: 31.6 mg/dL (ref 0.0–40.0)

## 2014-04-30 LAB — TSH: TSH: 1 u[IU]/mL (ref 0.35–4.50)

## 2014-05-05 ENCOUNTER — Encounter: Payer: Self-pay | Admitting: Endocrinology

## 2014-05-05 ENCOUNTER — Telehealth: Payer: Self-pay | Admitting: Endocrinology

## 2014-05-05 ENCOUNTER — Ambulatory Visit (INDEPENDENT_AMBULATORY_CARE_PROVIDER_SITE_OTHER): Payer: Medicare Other | Admitting: Endocrinology

## 2014-05-05 VITALS — BP 136/64 | HR 73 | Temp 98.1°F | Resp 16 | Ht 67.0 in | Wt 130.6 lb

## 2014-05-05 DIAGNOSIS — E041 Nontoxic single thyroid nodule: Secondary | ICD-10-CM

## 2014-05-05 DIAGNOSIS — E039 Hypothyroidism, unspecified: Secondary | ICD-10-CM

## 2014-05-05 DIAGNOSIS — D2122 Benign neoplasm of connective and other soft tissue of left lower limb, including hip: Secondary | ICD-10-CM

## 2014-05-05 DIAGNOSIS — E871 Hypo-osmolality and hyponatremia: Secondary | ICD-10-CM | POA: Diagnosis not present

## 2014-05-05 DIAGNOSIS — M810 Age-related osteoporosis without current pathological fracture: Secondary | ICD-10-CM

## 2014-05-05 DIAGNOSIS — N3281 Overactive bladder: Secondary | ICD-10-CM

## 2014-05-05 MED ORDER — SOLIFENACIN SUCCINATE 5 MG PO TABS: 5.0000 mg | ORAL_TABLET | Freq: Every day | ORAL | Status: DC

## 2014-05-05 NOTE — Telephone Encounter (Signed)
She can call Dr Alvan Dame her orthopedist

## 2014-05-05 NOTE — Progress Notes (Signed)
Patient ID: Joanna [name], female   DOB: [birth year], 81 y.o.   MRN: [id number]    Chief complaint: Follow-up of various problems   History of Present Illness:   1.  She is again somewhat unclear about what she is taking various medications for what appears to be taking her prescribed doses.   2.  Weight loss: She had lost a significant amount of weight in 2015 but it appears to have leveled off. She is trying to eat a little better   Wt Readings from Last 3 Encounters:  05/05/14 130 lb 9.6 oz (59.24 kg)  03/03/14 130 lb 9.6 oz (59.24 kg)  02/03/14 126 lb 3.2 oz (57.244 kg)     3.  Has history of hypothyroidism, mild and TSH has been consistently normal with taking 62.5 mcg daily.  No unusual fatigue. She is taking the brand name Synthroid and takes it very regularly in the mornings     Lab Results  Component Value Date   TSH 1.00 04/30/2014    4. HYPERTENSION: She has had long-standing hypertension with consistently good control and requiring small doses of medications of metoprolol and losartan.  Does not monitor at home  5.  She is asking about the swelling on her left dorsum of the foot which has been present for long time but is uncomfortable.  She wants it to be removed        Medication List       This list is accurate as of: 05/05/14  1:24 PM.  Always use your most recent med list.               b complex vitamins tablet  Take 1 tablet by mouth every morning.     cholecalciferol 1000 UNITS tablet  Commonly known as:  VITAMIN D  Take 1,000 Units by mouth daily.     doxycycline 100 MG capsule  Commonly known as:  VIBRAMYCIN     fish oil-omega-3 fatty acids 1000 MG capsule  Take 2 g by mouth daily.     gabapentin 300 MG capsule  Commonly known as:  NEURONTIN  Take 1- 2 capsules by mouth up to 3 times daily as needed     levothyroxine 125 MCG tablet  Commonly known as:  SYNTHROID  Take 1 tablet (125 mcg total) by mouth daily before breakfast. DAW  take 1/2 tablet daily     losartan 50 MG tablet  Commonly known as:  COZAAR  Take 1 tablet (50 mg total) by mouth every evening.     metoprolol succinate 50 MG 24 hr tablet  Commonly known as:  TOPROL-XL  Take 1 tablet (50 mg total) by mouth daily. Take with or immediately following a meal.     mometasone-formoterol 200-5 MCG/ACT Aero  Commonly known as:  DULERA  Inhale 2 puffs into the lungs 2 (two) times daily.     multivitamin with minerals Tabs tablet  Take 1 tablet by mouth every morning.     MYRBETRIQ 50 MG Tb24 tablet  Generic drug:  mirabegron ER     naphazoline-pheniramine 0.025-0.3 % ophthalmic solution  Commonly known as:  NAPHCON-A  Place 2 drops into both eyes 4 (four) times daily as needed for irritation or allergies.     nortriptyline 25 MG capsule  Commonly known as:  PAMELOR     omalizumab 150 MG injection  Commonly known as:  XOLAIR  Inject 150 mg into the skin every 28 (twenty-eight) days. Administered by  Dr. Gwenette Greet ([id number]) [phone]     oxazepam 10 MG capsule  Commonly known as:  SERAX  Take 10 mg by mouth at bedtime as needed for sleep or anxiety.     PROAIR HFA 108 (90 BASE) MCG/ACT inhaler  Generic drug:  albuterol  Inhale 2 puffs into the lungs every 6 (six) hours as needed for wheezing or shortness of breath.     simvastatin 20 MG tablet  Commonly known as:  ZOCOR  Take 1 tablet by mouth  every evening     vitamin C 500 MG tablet  Commonly known as:  ASCORBIC ACID  Take 500 mg by mouth daily.     zoledronic acid 5 MG/100ML Soln injection  Commonly known as:  RECLAST  Inject 100 mLs (5 mg total) into the vein once.        Allergies:  Allergies  Allergen Reactions  . Cefuroxime Axetil Hives    Not sure.. Patient can be allergic to it today but a year from now she may not be.  . Lactose Intolerance (Gi) Other (See Comments)    Gi upset  . Clarithromycin Palpitations  . Codeine Palpitations  . Sulfonamide Derivatives Palpitations     Past Medical History  Diagnosis Date  . IBS (irritable bowel syndrome)   . Hypothyroid   . GERD (gastroesophageal reflux disease)   . Hypertension   . Allergic rhinitis   . Asthma   . Complication of anesthesia     confusion after 04/2012    Past Surgical History  Procedure Laterality Date  . Mastectomy      double  . Bladder surgery    . Nasal sinus surgery    . Hip arthroplasty Left 05/04/2012    Procedure: ARTHROPLASTY BIPOLAR HIP;  Surgeon: Mauri Pole, MD;  Location: [facility];  Service: Orthopedics;  Laterality: Left;  . Closed reduction wrist fracture Left 05/04/2012    Procedure: CLOSED REDUCTION WRIST;  Surgeon: Mauri Pole, MD;  Location: [facility];  Service: Orthopedics;  Laterality: Left;  with casting  . Open reduction internal fixation (orif) distal radial fracture Left 05/29/2012    Procedure: OPEN REDUCTION INTERNAL FIXATION (ORIF) DISTAL RADIAL FRACTURE;  Surgeon: Linna Hoff, MD;  Location: [facility];  Service: Orthopedics;  Laterality: Left;  . Foot surgery    . Abdominal hysterectomy      Family History  Problem Relation Age of Onset  . CAD Mother   . Hypertension Father   . Breast cancer Sister     Social History:  reports that she has never smoked. She has never used smokeless tobacco. She reports that she does not drink alcohol or use illicit drugs.  Review of Systems    IDIOPATHIC neuropathy: She is seems to have had more symptom of paresthesias recently but she is taking up to 4 gabapentin a day with relief recently  History of osteoporosis/osteopenia. She had a bone density in 2014 which showed osteopenia with T score -2.3. She had refused to take Fosamax. She did get a Reclast infusion without any side effects in 01/2013 She is taking her calcium and vitamin D preparation as well as 1000 units of vitamin D 3. Vitamin D level is normal  Had  multiple fractures from fall in 2014 Bone density 12/14 showed Osteopenia. Lowest T score of -2.3 at the  right femoral neck.  She had her last infusion of Reclast in 01/2013.   She will need to get this annually because of  her history of multiple fractures  Has had COPD followed by pulmonologist   She has a history of hypercholesterolemia and has been taking her simvastatin regularly with adequate control  Lab Results  Component Value Date   CHOL 215* 04/30/2014   HDL 69.30 04/30/2014   LDLCALC 114* 04/30/2014   LDLDIRECT 93.8 11/05/2012   TRIG 158.0* 04/30/2014   CHOLHDL 3 04/30/2014    History of chronic insomnia. She has tried various hypnotics for this and had been taking only nortriptyline now and seems to do well with this  BLADDER dysfunction: She still has incontinence especially at night and not getting relief from current regimen from urologist    Exam:  BP 136/64 mmHg  Pulse 73  Temp(Src) 98.1 F (36.7 C)  Resp 16  Ht 5\' 7"  (1.702 m)  Wt 130 lb 9.6 oz (59.24 kg)  BMI 20.45 kg/m2  SpO2 97%  She has a 2 cm firm cystic lesion on the dorsum of the left foot which is relatively mobile and is nontender No pedal edema present  She has a smooth slightly firm left-sided 3 cm thyroid nodule  Biceps reflexes appear normal  Assessment/Plan:   Multiple problems as detailed in history of present illness and review of systems  Reviewed the medication regimen and advised her to continue all medications as directed  Gen. surgery referral for lesion on her foot which does not appear to be bone related  Mild hyponatremia: Asymptomatic and stable.  Unclear etiology.  Advised her to avoid excessive fluids  THYROID nodule: She has not gone for her ultrasound which was scheduled and will make another appointment.  Discussed importance of evaluating this as she has not had this before  Osteopenia with history of fractures: She had Reclast in 1/15 and will repeat this now as she did not show up for her last infusion  OAB: She will be given a trial of 5 mg solifenacin  which  appears to be the preferred drug from her insurance.  Will not use oxybutynin because of her age  Total visit time including review of multiple problems, coordination of care, medication review, review of labs in counseling = 25 minutes  Pratham Cassatt

## 2014-05-05 NOTE — Telephone Encounter (Signed)
Dr Dwyane Dee , called [facility] [place] [facility] spoke with [facility].was told pt need to see podiatrist or orthopedic doctor.

## 2014-05-06 NOTE — Telephone Encounter (Signed)
Faxed referral notes ins to [facility]  They will schedule with the patient

## 2014-05-11 ENCOUNTER — Encounter: Payer: Self-pay | Admitting: Internal Medicine

## 2014-05-25 ENCOUNTER — Other Ambulatory Visit: Payer: Self-pay | Admitting: Endocrinology

## 2014-05-28 DIAGNOSIS — M67471 Ganglion, right ankle and foot: Secondary | ICD-10-CM | POA: Diagnosis not present

## 2014-06-08 ENCOUNTER — Other Ambulatory Visit: Payer: Self-pay | Admitting: Endocrinology

## 2014-06-08 ENCOUNTER — Telehealth: Payer: Self-pay | Admitting: Endocrinology

## 2014-06-08 DIAGNOSIS — K5900 Constipation, unspecified: Secondary | ICD-10-CM

## 2014-06-08 NOTE — Telephone Encounter (Signed)
noted 

## 2014-06-08 NOTE — Telephone Encounter (Signed)
Ordered a consultation

## 2014-06-08 NOTE — Telephone Encounter (Signed)
Please see below and advise.

## 2014-06-08 NOTE — Telephone Encounter (Signed)
Pt having issues with bowels and is requesting a GI consult. Stools are hard, foul-smelling, has to really push to get them out

## 2014-06-10 ENCOUNTER — Telehealth: Payer: Self-pay | Admitting: *Deleted

## 2014-06-10 NOTE — Telephone Encounter (Signed)
Patients daughter called requesting a referral for Joanna [name] to a bladder or kidney specialist, daughter states she's having nighttime accidents and having to get up 7 times a night to urinate.

## 2014-06-10 NOTE — Telephone Encounter (Signed)
Message left on daughter's vm.  

## 2014-06-10 NOTE — Telephone Encounter (Signed)
She already sees a urologist, she can call [facility] for follow-up appointment

## 2014-06-11 ENCOUNTER — Telehealth: Payer: Self-pay | Admitting: Endocrinology

## 2014-06-11 NOTE — Telephone Encounter (Signed)
Pt has been dismissed from [facility] she is requesting a referral to be seen in [place]  Please call her friend Zigmund Daniel # [phone]

## 2014-06-15 ENCOUNTER — Inpatient Hospital Stay: Admission: RE | Admit: 2014-06-15 | Payer: Medicare Other | Source: Ambulatory Visit

## 2014-06-17 ENCOUNTER — Telehealth: Payer: Self-pay | Admitting: Endocrinology

## 2014-06-17 DIAGNOSIS — R109 Unspecified abdominal pain: Secondary | ICD-10-CM | POA: Diagnosis not present

## 2014-06-17 DIAGNOSIS — K921 Melena: Secondary | ICD-10-CM | POA: Diagnosis not present

## 2014-06-17 NOTE — Telephone Encounter (Signed)
Zigmund Daniel called regarding Joanna [name]. She states the patient needs to see a GI doctor regarding her bowels  Please call and speak with Zigmund Daniel as she is having concerns    Thank you

## 2014-06-17 NOTE — Telephone Encounter (Signed)
Can you put in a new referral to somewhere else, she has been dismissed from [facility].

## 2014-06-21 ENCOUNTER — Other Ambulatory Visit: Payer: Self-pay | Admitting: Endocrinology

## 2014-06-21 DIAGNOSIS — K5900 Constipation, unspecified: Secondary | ICD-10-CM

## 2014-06-21 NOTE — Telephone Encounter (Signed)
Completed.

## 2014-06-22 ENCOUNTER — Ambulatory Visit: Payer: Medicare Other | Admitting: Pulmonary Disease

## 2014-06-22 DIAGNOSIS — K921 Melena: Secondary | ICD-10-CM | POA: Diagnosis not present

## 2014-06-22 DIAGNOSIS — R1013 Epigastric pain: Secondary | ICD-10-CM | POA: Diagnosis not present

## 2014-06-24 ENCOUNTER — Telehealth: Payer: Self-pay | Admitting: Pulmonary Disease

## 2014-06-24 ENCOUNTER — Encounter: Payer: Self-pay | Admitting: Pulmonary Disease

## 2014-06-24 ENCOUNTER — Ambulatory Visit (INDEPENDENT_AMBULATORY_CARE_PROVIDER_SITE_OTHER): Payer: Medicare Other | Admitting: Pulmonary Disease

## 2014-06-24 VITALS — BP 118/72 | HR 77 | Temp 97.8°F | Ht 67.0 in | Wt 128.0 lb

## 2014-06-24 DIAGNOSIS — J449 Chronic obstructive pulmonary disease, unspecified: Secondary | ICD-10-CM | POA: Diagnosis not present

## 2014-06-24 NOTE — Assessment & Plan Note (Signed)
The pt has done very well since the last visit on [place] and xolair.  She has not had to use her rescue inaler, and is mobilizing mucus well. I have asked her to continue on her current meds, and try to stay as active as possible.

## 2014-06-24 NOTE — Telephone Encounter (Signed)
lmomtcb x1 for pt 

## 2014-06-24 NOTE — Progress Notes (Signed)
   Subjective:    Patient ID: Joanna [name], female    DOB: 05-27-1928, 81 y.o.   MRN: [id number]  HPI The patient comes in today for follow-up of her known chronic obstructive asthma. She has been on dulera, and feels this has made a very big difference in her breathing and mucociliary clearance. She also continues on Xolair for the allergic component of her disease. She feels that her breathing has been stable since the last visit, and has not required excessive use of her rescue inhaler.   Review of Systems  Constitutional: Negative for fever and unexpected weight change.  HENT: Negative for congestion, dental problem, ear pain, nosebleeds, postnasal drip, rhinorrhea, sinus pressure, sneezing, sore throat and trouble swallowing.   Eyes: Negative for redness and itching.  Respiratory: Positive for cough and shortness of breath. Negative for chest tightness and wheezing.   Cardiovascular: Negative for palpitations and leg swelling.  Gastrointestinal: Negative for nausea and vomiting.  Genitourinary: Negative for dysuria.  Musculoskeletal: Negative for joint swelling.  Skin: Negative for rash.  Neurological: Negative for headaches.  Hematological: Does not bruise/bleed easily.  Psychiatric/Behavioral: Negative for dysphoric mood. The patient is not nervous/anxious.        Objective:   Physical Exam Thin female in no acute distress Nose without purulence or discharge noted Neck without lymphadenopathy or thyromegaly Chest totally clear to auscultation except for an occasional rhonchi Cardiac exam with regular rate and rhythm Lower extremities without edema, no cyanosis Alert and oriented, moves all 4 extremities.       Assessment & Plan:

## 2014-06-24 NOTE — Addendum Note (Signed)
Addended by: Inge Rise on: 06/24/2014 12:28 PM   Modules accepted: Medications

## 2014-06-24 NOTE — Patient Instructions (Signed)
Stay on dulera with your spacer. Continue on xolair. followup with Dr. Lamonte Sakai in 64mos.

## 2014-06-24 NOTE — Telephone Encounter (Signed)
Joanna [name] please call pt and find out from the horses mouth if she is truly getting xolair injections or not.

## 2014-06-24 NOTE — Telephone Encounter (Signed)
I called spoke with pt. Advised her we were told she had not had xolair since august. She reports she didn't think it had been that long but now that she thinks of it, she has not been getting it monthly like she thought she was. Will make [facility] aware.

## 2014-06-25 NOTE — Telephone Encounter (Signed)
Patient notified.  Nothing further needed. 

## 2014-06-25 NOTE — Telephone Encounter (Signed)
I will give her the choice of getting back on the med or trying to stay off and see how she does.

## 2014-06-25 NOTE — Telephone Encounter (Signed)
FYI - to [facility]

## 2014-07-02 ENCOUNTER — Other Ambulatory Visit: Payer: Self-pay | Admitting: Gastroenterology

## 2014-07-02 DIAGNOSIS — R1084 Generalized abdominal pain: Secondary | ICD-10-CM

## 2014-07-05 ENCOUNTER — Ambulatory Visit: Payer: Medicare Other | Admitting: Pulmonary Disease

## 2014-07-07 ENCOUNTER — Ambulatory Visit
Admission: RE | Admit: 2014-07-07 | Discharge: 2014-07-07 | Disposition: A | Discharge status: Home / Self Care | Payer: Medicare Other | Source: Ambulatory Visit | Attending: Gastroenterology | Admitting: Gastroenterology

## 2014-07-07 DIAGNOSIS — R1084 Generalized abdominal pain: Secondary | ICD-10-CM

## 2014-07-07 DIAGNOSIS — R634 Abnormal weight loss: Secondary | ICD-10-CM | POA: Diagnosis not present

## 2014-07-07 DIAGNOSIS — R109 Unspecified abdominal pain: Secondary | ICD-10-CM | POA: Diagnosis not present

## 2014-07-07 MED ORDER — IOPAMIDOL (ISOVUE-300) INJECTION 61%
100.0000 mL | Freq: Once | INTRAVENOUS | Status: AC | PRN
  Administered 2014-07-07: 100 mL via INTRAVENOUS

## 2014-07-08 ENCOUNTER — Other Ambulatory Visit: Payer: Medicare Other

## 2014-07-08 ENCOUNTER — Telehealth: Payer: Self-pay | Admitting: Endocrinology

## 2014-07-08 NOTE — Telephone Encounter (Signed)
Please give pt call about needing a referral to a pulmonary doctor due to findigns found on xray

## 2014-07-08 NOTE — Telephone Encounter (Signed)
Patient already has a pulmonologist. Joanna [name] of that.

## 2014-07-28 ENCOUNTER — Other Ambulatory Visit: Payer: Self-pay | Admitting: *Deleted

## 2014-08-02 DIAGNOSIS — Z1211 Encounter for screening for malignant neoplasm of colon: Secondary | ICD-10-CM | POA: Diagnosis not present

## 2014-08-04 ENCOUNTER — Other Ambulatory Visit: Payer: Self-pay | Admitting: Internal Medicine

## 2014-08-06 ENCOUNTER — Ambulatory Visit
Admission: RE | Admit: 2014-08-06 | Discharge: 2014-08-06 | Disposition: A | Discharge status: Home / Self Care | Payer: Medicare Other | Source: Ambulatory Visit | Attending: Endocrinology | Admitting: Endocrinology

## 2014-08-06 DIAGNOSIS — E041 Nontoxic single thyroid nodule: Secondary | ICD-10-CM

## 2014-08-10 NOTE — Progress Notes (Signed)
Quick Note:  Please let patient know that she has a growth on the left side of thyroid and needs needle biopsy.. Will schedule this if she agrees  ______

## 2014-08-13 ENCOUNTER — Ambulatory Visit (INDEPENDENT_AMBULATORY_CARE_PROVIDER_SITE_OTHER): Payer: Medicare Other | Admitting: Emergency Medicine

## 2014-08-13 ENCOUNTER — Encounter: Payer: Self-pay | Admitting: Emergency Medicine

## 2014-08-13 VITALS — BP 132/68 | HR 61 | Wt 123.0 lb

## 2014-08-13 DIAGNOSIS — J4520 Mild intermittent asthma, uncomplicated: Secondary | ICD-10-CM

## 2014-08-13 NOTE — Progress Notes (Signed)
Subjective:    Patient ID: Joanna Joanna [name], female    DOB: Feb 18, 1928, 81 y.o.   MRN: [id number]  HPI 81 year old never smoker with a history of hypertension, irritable bowel syndrome, and followed by Joanna Joanna [name] for for asthma and allergic rhinitis. She has previously been on Xolair but this was apparently discontinued in June 2015. She is currently on Dulera bid, albuterol when necessary.  I reviewed her prior PFT from 2006, shows mild AFL, hyperinflation. She feels that she has benefited from the Joanna Joanna [name]. She denies significant trouble breathing. She tells me that she doesn'Joanna have any albuterol at this time. She is unclear about the names of her inhalers, but as best I can tell she is using the Joanna Joanna [name] on a schedule. She began to have some cough beginning 5 days ago. Non-productive. She is able to shop and walk through the store.   Review of Systems As per HPI   Past Medical History  Diagnosis Date  . IBS (irritable bowel syndrome)   . Hypothyroid   . GERD (gastroesophageal reflux disease)   . Hypertension   . Allergic rhinitis   . Asthma   . Complication of anesthesia     confusion after 04/2012     Family History  Problem Relation Age of Onset  . CAD Mother   . Hypertension Father   . Breast cancer Sister      History   Social History  . Marital Status: Widowed    Spouse Name: N/A  . Number of Children: 2  . Years of Education: N/A   Occupational History  . unemployed    Social History Main Topics  . Smoking status: Never Smoker   . Smokeless tobacco: Never Used  . Alcohol Use: No  . Drug Use: No  . Sexual Activity: Not on file   Other Topics Concern  . Not on file   Social History Narrative     Allergies  Allergen Reactions  . Cefuroxime Axetil Hives    Not sure.. Patient can be allergic to it today but a year from now she may not be.  . Lactose Intolerance (Gi) Other (See Comments)    Gi upset  . Clarithromycin Palpitations  . Codeine Palpitations  .  Sulfonamide Derivatives Palpitations     Outpatient Prescriptions Prior to Visit  Medication Sig Dispense Refill  . albuterol (PROAIR HFA) 108 (90 BASE) MCG/ACT inhaler Inhale 2 puffs into the lungs every 6 (six) hours as needed for wheezing or shortness of breath.     Marland Kitchen b complex vitamins tablet Take 1 tablet by mouth every morning.    . cholecalciferol (VITAMIN D) 1000 UNITS tablet Take 1,000 Units by mouth daily.    . fish oil-omega-3 fatty acids 1000 MG capsule Take 2 g by mouth daily.    Marland Kitchen gabapentin (NEURONTIN) 300 MG capsule Take 1- 2 capsules by mouth up to 3 times daily as needed 150 capsule 3  . losartan (COZAAR) 50 MG tablet Take 1 tablet (50 mg total) by mouth every evening. 90 tablet 1  . metoprolol succinate (TOPROL-XL) 50 MG 24 hr tablet Take 1 tablet (50 mg total) by mouth daily. Take with or immediately following a meal. 90 tablet 3  . mometasone-formoterol (DULERA) 200-5 MCG/ACT AERO Inhale 2 puffs into the lungs 2 (two) times daily. 2 Inhaler 0  . Multiple Vitamin (MULTIVITAMIN WITH MINERALS) TABS Take 1 tablet by mouth every morning.    . naphazoline-pheniramine (NAPHCON-A) 0.025-0.3 % ophthalmic solution  Place 2 drops into both eyes 4 (four) times daily as needed for irritation or allergies.    Marland Kitchen nortriptyline (PAMELOR) 25 MG capsule Take 25 mg by mouth daily.     . nortriptyline (PAMELOR) 25 MG capsule Take 1 capsule by mouth at  bedtime 90 capsule 1  . omalizumab (XOLAIR) 150 MG injection Inject 150 mg into the skin every 28 (twenty-eight) days. Administered by Joanna Joanna [name] [phone]    . simvastatin (ZOCOR) 20 MG tablet Take 1 tablet by mouth  every evening 90 tablet 1  . solifenacin (VESICARE) 5 MG tablet Take 1 tablet (5 mg total) by mouth daily. 30 tablet 3  . SYNTHROID 125 MCG tablet Take one-half tablet by  mouth daily 45 tablet 2  . vitamin C (ASCORBIC ACID) 500 MG tablet Take 500 mg by mouth daily.    . zoledronic acid (RECLAST) 5 MG/100ML SOLN injection Inject  100 mLs (5 mg total) into the vein once. 100 mL 0   No facility-administered medications prior to visit.         Objective:   Physical Exam  Filed Vitals:   08/13/14 1427  BP: 132/68  Joanna [name]: 63  Weight: 123 lb (55.792 kg)  SpO2: 94%   Gen: Pleasant, thin elderly woman, in no distress,  normal affect  ENT: No lesions,  mouth clear,  oropharynx clear, no postnasal drip  Neck: No JVD, no TMG, no carotid bruits  Lungs: No use of accessory muscles,  Distant, clear without rales or rhonchi  Cardiovascular: RRR, heart sounds normal, no murmur or gallops, no peripheral edema  Musculoskeletal: No deformities, no cyanosis or clubbing  Neuro: alert, non focal  Skin: Warm, no lesions or rashes      Assessment & Plan:  Extrinsic asthma She appears to be clinically stable although she is a difficult history giver. I believe that she is on Dulera bid, although she is unsure of the name. I'm not convinced that she has a rescue inhaler. I would like for her to see Joanna Joanna [name] for medication review. In the meantime I asked her to continue her medicines the way she is taking them. I do not believe she needs to be reconsidered for Xolair. He likely needs repeat pulmonary function testing at some point in the future since her original study was in 2006

## 2014-08-13 NOTE — Patient Instructions (Addendum)
Ruthe Mannan is your scheduled maintenance inhaled medication. Take 2 puffs twice a day every day at the same time. Rinse your mouth and gargle after taking this medicine.  Pro-air (albuterol) were rescue medicine, to be taken when you have breathing symptoms. Use  Use 2 puffs up to every 4 hours only if needed for shortness of breath We will arrange for you to have a medication review here at the office. He needs to bring all of her medicines with you, everything you take, to that appointment Follow with Dr Lamonte Sakai in 3 months or sooner if you have any problems.

## 2014-08-13 NOTE — Assessment & Plan Note (Signed)
She appears to be clinically stable although she is a difficult history giver. I believe that she is on Dulera bid, although she is unsure of the name. I'm not convinced that she has a rescue inhaler. I would like for her to see T Parrett for medication review. In the meantime I asked her to continue her medicines the way she is taking them. I do not believe she needs to be reconsidered for Xolair. He likely needs repeat pulmonary function testing at some point in the future since her original study was in 2006

## 2014-08-16 ENCOUNTER — Other Ambulatory Visit: Payer: Self-pay | Admitting: Endocrinology

## 2014-08-16 ENCOUNTER — Telehealth: Payer: Self-pay | Admitting: Emergency Medicine

## 2014-08-16 DIAGNOSIS — E041 Nontoxic single thyroid nodule: Secondary | ICD-10-CM

## 2014-08-16 DIAGNOSIS — J4520 Mild intermittent asthma, uncomplicated: Secondary | ICD-10-CM

## 2014-08-16 DIAGNOSIS — J9811 Atelectasis: Secondary | ICD-10-CM

## 2014-08-16 NOTE — Telephone Encounter (Signed)
Spoke with the pt's daughter-in-law, Estill Bamberg  She states she wants to ensure RB was aware that the pt had ct abd 07/07/14 (report in [place]) She states that Dr Paulita Fujita had mentioned there were several lung abnormalities that needed f/u  RB please advise thanks

## 2014-08-18 NOTE — Telephone Encounter (Signed)
Dr. Lamonte Sakai - Pt wants to ensure that you are aware of pt's CT done on 07/07/14 Dr. Paulita Fujita says that there were several abnormalities of the lung that needs to be addressed  Please advise

## 2014-08-20 ENCOUNTER — Telehealth: Payer: Self-pay | Admitting: Endocrinology

## 2014-08-20 NOTE — Telephone Encounter (Signed)
Left message on her vm about appointment time and date

## 2014-08-20 NOTE — Telephone Encounter (Signed)
Please call evelyn with the info for this pt's testing

## 2014-08-20 NOTE — Telephone Encounter (Signed)
RB is out of the office until 08/23/14.

## 2014-08-23 NOTE — Telephone Encounter (Signed)
I reviewed patient's CT abdomen again today. It shows old LLL scar and what appears to be a rounded L basilar area most consistent with rounded atelectasis that has changed some compared with prior scan in 2012. I believe it would be appropriate for [place] to follow this area on a repeat Ct scan of the chest, no contrast, in 6 months (that would be in December 2016) to make sure it is not changing. Please let her know that I would like to get a CT chest in December

## 2014-08-23 NOTE — Telephone Encounter (Signed)
Called and spoke to pt's daughter in law. Informed her of the results and recs per RB. Order placed for f/u CT. Pt verbalized understanding and denied any further questions or concerns at this time.

## 2014-08-31 ENCOUNTER — Other Ambulatory Visit: Payer: Medicare Other

## 2014-09-02 ENCOUNTER — Other Ambulatory Visit (HOSPITAL_COMMUNITY)
Admission: RE | Admit: 2014-09-02 | Discharge: 2014-09-02 | Disposition: A | Discharge status: Home / Self Care | Payer: Medicare Other | Source: Ambulatory Visit | Attending: Interventional Radiology | Admitting: Interventional Radiology

## 2014-09-02 ENCOUNTER — Ambulatory Visit
Admission: RE | Admit: 2014-09-02 | Discharge: 2014-09-02 | Disposition: A | Discharge status: Home / Self Care | Payer: Medicare Other | Source: Ambulatory Visit | Attending: Endocrinology | Admitting: Endocrinology

## 2014-09-02 DIAGNOSIS — E041 Nontoxic single thyroid nodule: Secondary | ICD-10-CM | POA: Diagnosis present

## 2014-09-03 ENCOUNTER — Ambulatory Visit: Payer: Medicare Other | Admitting: Endocrinology

## 2014-09-06 NOTE — Progress Notes (Signed)
Quick Note:  Please let patient know that the biopsy result is ok and no further action needed ______

## 2014-09-07 ENCOUNTER — Encounter: Payer: Medicare Other | Admitting: Adult Health

## 2014-09-13 ENCOUNTER — Encounter (HOSPITAL_COMMUNITY): Payer: Self-pay | Admitting: *Deleted

## 2014-09-13 ENCOUNTER — Emergency Department (HOSPITAL_COMMUNITY)
Admission: EM | Admit: 2014-09-13 | Discharge: 2014-09-13 | Disposition: A | Discharge status: Home / Self Care | Payer: Medicare Other | Attending: Emergency Medicine | Admitting: Emergency Medicine

## 2014-09-13 DIAGNOSIS — Z8719 Personal history of other diseases of the digestive system: Secondary | ICD-10-CM | POA: Diagnosis not present

## 2014-09-13 DIAGNOSIS — Z79899 Other long term (current) drug therapy: Secondary | ICD-10-CM | POA: Diagnosis not present

## 2014-09-13 DIAGNOSIS — E039 Hypothyroidism, unspecified: Secondary | ICD-10-CM | POA: Diagnosis not present

## 2014-09-13 DIAGNOSIS — N390 Urinary tract infection, site not specified: Secondary | ICD-10-CM | POA: Insufficient documentation

## 2014-09-13 DIAGNOSIS — I10 Essential (primary) hypertension: Secondary | ICD-10-CM | POA: Diagnosis not present

## 2014-09-13 DIAGNOSIS — R350 Frequency of micturition: Secondary | ICD-10-CM | POA: Diagnosis present

## 2014-09-13 DIAGNOSIS — J45909 Unspecified asthma, uncomplicated: Secondary | ICD-10-CM | POA: Insufficient documentation

## 2014-09-13 LAB — URINE MICROSCOPIC-ADD ON

## 2014-09-13 LAB — URINALYSIS, ROUTINE W REFLEX MICROSCOPIC
Bilirubin Urine: NEGATIVE
Glucose, UA: NEGATIVE mg/dL
KETONES UR: NEGATIVE mg/dL
Nitrite: NEGATIVE
PROTEIN: NEGATIVE mg/dL
Specific Gravity, Urine: 1.01 (ref 1.005–1.030)
Urobilinogen, UA: 0.2 mg/dL (ref 0.0–1.0)
pH: 7.5 (ref 5.0–8.0)

## 2014-09-13 MED ORDER — PHENAZOPYRIDINE HCL 200 MG PO TABS: 200.0000 mg | ORAL_TABLET | Freq: Three times a day (TID) | ORAL | Status: DC

## 2014-09-13 MED ORDER — PHENAZOPYRIDINE HCL 200 MG PO TABS
200.0000 mg | ORAL_TABLET | Freq: Three times a day (TID) | ORAL | Status: DC
  Administered 2014-09-13: 200 mg via ORAL
  Filled 2014-09-13: qty 1

## 2014-09-13 MED ORDER — LEVOFLOXACIN 500 MG PO TABS: 500.0000 mg | ORAL_TABLET | Freq: Every day | ORAL | Status: AC

## 2014-09-13 MED ORDER — LEVOFLOXACIN 500 MG PO TABS
500.0000 mg | ORAL_TABLET | Freq: Once | ORAL | Status: AC
  Administered 2014-09-13: 500 mg via ORAL
  Filled 2014-09-13: qty 1

## 2014-09-13 MED ORDER — CIPROFLOXACIN HCL 500 MG PO TABS: 500.0000 mg | ORAL_TABLET | Freq: Once | ORAL | Status: DC

## 2014-09-13 NOTE — ED Notes (Signed)
Pt c/o burning upon urination, increase urination and and urinary frequency for several days; pt states that the burning has been getting worse; pt states that pressure makes it feel better

## 2014-09-13 NOTE — Discharge Instructions (Signed)

## 2014-09-13 NOTE — ED Provider Notes (Signed)
CSN: [id number]     Arrival date & time 09/13/14  2001 History  This chart was scribed for Charlann Lange, working with Quintella Reichert, MD by Steva Colder, ED Scribe. The patient was seen in room WTR8/WTR8 at 8:58 PM.    Chief Complaint  Patient presents with  . Urinary Frequency      The history is provided by the patient. No language interpreter was used.    Joanna [name] is a 81 y.o. female who presents to the Emergency Department complaining of urinary frequency onset 3 days. Pt reports that she has had an UTI over 60 years ago. Pt is having associated symptoms of worsening dysuria and urgency. Pt reports that her symptoms were alleviated with pressure to the area by the way of a towel. She denies n/v, fever, abdominal pain, and any other symptoms.    Past Medical History  Diagnosis Date  . IBS (irritable bowel syndrome)   . Hypothyroid   . GERD (gastroesophageal reflux disease)   . Hypertension   . Allergic rhinitis   . Asthma   . Complication of anesthesia     confusion after 04/2012   Past Surgical History  Procedure Laterality Date  . Mastectomy      double  . Bladder surgery    . Nasal sinus surgery    . Hip arthroplasty Left 05/04/2012    Procedure: ARTHROPLASTY BIPOLAR HIP;  Surgeon: Mauri Pole, MD;  Location: [facility];  Service: Orthopedics;  Laterality: Left;  . Closed reduction wrist fracture Left 05/04/2012    Procedure: CLOSED REDUCTION WRIST;  Surgeon: Mauri Pole, MD;  Location: [facility];  Service: Orthopedics;  Laterality: Left;  with casting  . Open reduction internal fixation (orif) distal radial fracture Left 05/29/2012    Procedure: OPEN REDUCTION INTERNAL FIXATION (ORIF) DISTAL RADIAL FRACTURE;  Surgeon: Linna Hoff, MD;  Location: [facility];  Service: Orthopedics;  Laterality: Left;  . Foot surgery    . Abdominal hysterectomy     Family History  Problem Relation Age of Onset  . CAD Mother   . Hypertension Father   . Breast cancer Sister    Social  History  Substance Use Topics  . Smoking status: Never Smoker   . Smokeless tobacco: Never Used  . Alcohol Use: No   OB History    No data available     Review of Systems  Constitutional: Negative for fever.  Gastrointestinal: Negative for nausea, vomiting and abdominal pain.  Genitourinary: Positive for dysuria, urgency and frequency.      Allergies  Cefuroxime axetil; Lactose intolerance (gi); Clarithromycin; Codeine; and Sulfonamide derivatives  Home Medications   Prior to Admission medications   Medication Sig Start Date End Date Taking? Authorizing Provider  albuterol (PROAIR HFA) 108 (90 BASE) MCG/ACT inhaler Inhale 2 puffs into the lungs every 6 (six) hours as needed for wheezing or shortness of breath.     Historical Provider, MD  b complex vitamins tablet Take 1 tablet by mouth every morning.    Historical Provider, MD  cholecalciferol (VITAMIN D) 1000 UNITS tablet Take 1,000 Units by mouth daily.    Historical Provider, MD  fish oil-omega-3 fatty acids 1000 MG capsule Take 2 g by mouth daily.    Historical Provider, MD  gabapentin (NEURONTIN) 300 MG capsule Take 1- 2 capsules by mouth up to 3 times daily as needed 04/13/14   Elayne Snare, MD  losartan (COZAAR) 50 MG tablet Take 1 tablet (50 mg  total) by mouth every evening. 01/25/14   Elayne Snare, MD  metoprolol succinate (TOPROL-XL) 50 MG 24 hr tablet Take 1 tablet (50 mg total) by mouth daily. Take with or immediately following a meal. 02/03/14   Elayne Snare, MD  mometasone-formoterol (DULERA) 200-5 MCG/ACT AERO Inhale 2 puffs into the lungs 2 (two) times daily. 03/03/14   Kathee Delton, MD  Multiple Vitamin (MULTIVITAMIN WITH MINERALS) TABS Take 1 tablet by mouth every morning.    Historical Provider, MD  naphazoline-pheniramine (NAPHCON-A) 0.025-0.3 % ophthalmic solution Place 2 drops into both eyes 4 (four) times daily as needed for irritation or allergies.    Historical Provider, MD  nortriptyline (PAMELOR) 25 MG capsule  Take 25 mg by mouth daily.  03/02/14   Historical Provider, MD  nortriptyline (PAMELOR) 25 MG capsule Take 1 capsule by mouth at  bedtime 08/04/14   Elayne Snare, MD  simvastatin (ZOCOR) 20 MG tablet Take 1 tablet by mouth  every evening 01/25/14   Elayne Snare, MD  solifenacin (VESICARE) 5 MG tablet Take 1 tablet (5 mg total) by mouth daily. 05/05/14   Elayne Snare, MD  SYNTHROID 125 MCG tablet Take one-half tablet by  mouth daily 05/26/14   Elayne Snare, MD  vitamin C (ASCORBIC ACID) 500 MG tablet Take 500 mg by mouth daily.    Historical Provider, MD  zoledronic acid (RECLAST) 5 MG/100ML SOLN injection Inject 100 mLs (5 mg total) into the vein once. 01/23/13   Elayne Snare, MD   BP 189/73 mmHg  Pulse 63  Temp(Src) 98 F (36.7 C) (Oral)  Resp 18  SpO2 98% Physical Exam  Constitutional: She is oriented to person, place, and time. She appears well-developed and well-nourished. No distress.  Very well appearing. Reliable historian.   HENT:  Head: Normocephalic and atraumatic.  Eyes: EOM are normal.  Neck: Neck supple. No tracheal deviation present.  Cardiovascular: Normal rate.   Pulmonary/Chest: Effort normal. No respiratory distress.  Abdominal: Soft. There is no tenderness.  Musculoskeletal: Normal range of motion.  Neurological: She is alert and oriented to person, place, and time.  Skin: Skin is warm and dry.  Psychiatric: She has a normal mood and affect. Her behavior is normal.  Nursing note and vitals reviewed.   ED Course  Procedures (including critical care time) DIAGNOSTIC STUDIES: Oxygen Saturation is 98% on RA, nl by my interpretation.    COORDINATION OF CARE: 9:00 PM Discussed treatment plan with pt at bedside which includes UA and pt agreed to plan.    Labs Review Labs Reviewed  URINALYSIS, ROUTINE W REFLEX MICROSCOPIC (NOT AT [facility]) - Abnormal; Notable for the following:    APPearance CLOUDY (*)    Hgb urine dipstick TRACE (*)    Leukocytes, UA LARGE (*)    All other  components within normal limits  URINE MICROSCOPIC-ADD ON - Abnormal; Notable for the following:    Bacteria, UA FEW (*)    All other components within normal limits  URINE CULTURE    Imaging Review No results found. I have personally reviewed and evaluated these images and lab results as part of my medical decision-making.   EKG Interpretation None      MDM   Final diagnoses:  None    1. UTI, uncomplicated  Well appearing elderly lady with symptoms c/w UTI, confirmed by UA. Well appearing, VSS. Recommended follow up with PCP after completion of abx to recheck urine.     I personally performed the services described in  this documentation, which was scribed in my presence. The recorded information has been reviewed and is accurate.   Charlann Lange, PA-C 09/13/14 2224  Quintella Reichert, MD 09/14/14 [phone]

## 2014-09-15 ENCOUNTER — Other Ambulatory Visit (INDEPENDENT_AMBULATORY_CARE_PROVIDER_SITE_OTHER): Payer: Medicare Other

## 2014-09-15 DIAGNOSIS — J4520 Mild intermittent asthma, uncomplicated: Secondary | ICD-10-CM

## 2014-09-15 DIAGNOSIS — E871 Hypo-osmolality and hyponatremia: Secondary | ICD-10-CM | POA: Diagnosis not present

## 2014-09-15 DIAGNOSIS — J9811 Atelectasis: Secondary | ICD-10-CM

## 2014-09-15 LAB — COMPREHENSIVE METABOLIC PANEL
ALBUMIN: 3.6 g/dL (ref 3.5–5.2)
ALK PHOS: 42 U/L (ref 39–117)
ALT: 13 U/L (ref 0–35)
AST: 22 U/L (ref 0–37)
BUN: 31 mg/dL — ABNORMAL HIGH (ref 6–23)
CALCIUM: 8.7 mg/dL (ref 8.4–10.5)
CHLORIDE: 101 meq/L (ref 96–112)
CO2: 27 mEq/L (ref 19–32)
CREATININE: 1.35 mg/dL — AB (ref 0.40–1.20)
GFR: 39.71 mL/min — ABNORMAL LOW (ref >60.00)
Glucose, Bld: 152 mg/dL — ABNORMAL HIGH (ref 70–99)
Potassium: 4 mEq/L (ref 3.5–5.1)
Sodium: 133 mEq/L — ABNORMAL LOW (ref 135–145)
TOTAL PROTEIN: 6.2 g/dL (ref 6.0–8.3)
Total Bilirubin: 0.5 mg/dL (ref 0.2–1.2)

## 2014-09-16 ENCOUNTER — Telehealth: Payer: Self-pay | Admitting: Endocrinology

## 2014-09-16 LAB — URINE CULTURE: Culture: >=100000

## 2014-09-16 NOTE — Telephone Encounter (Signed)
Her urine culture shows that the bacteria is sensitive to Levaquin and since she is still taking it may have other problems.  She can bring a urine sample today if needed for rechecking

## 2014-09-16 NOTE — Telephone Encounter (Signed)
Please see below, does she need to call her Urologist?

## 2014-09-16 NOTE — Telephone Encounter (Signed)
Yes

## 2014-09-16 NOTE — Telephone Encounter (Signed)
Pt is requesting antibiotics for the UTI she went to the ER for a few days ago please advise

## 2014-09-16 NOTE — Telephone Encounter (Signed)
I spoke with the patients daughter in law who said they gave her 5 pills of Levaquin at the ER.   She thinks Joanna [name] is still having some symptoms,  altho she told her that she had to give the medication some time, she was just seen in the ER on Monday night. DIL states that Joanna [name] does not have a Dealer, she thinks she was dismissed from that practice.

## 2014-09-17 ENCOUNTER — Telehealth (HOSPITAL_COMMUNITY): Payer: Self-pay

## 2014-09-17 NOTE — Telephone Encounter (Signed)
Post ED Visit - Positive Culture Follow-up  Culture report reviewed by antimicrobial stewardship pharmacist: []  Wes Fulton, Pharm.D., BCPS []  Heide Guile, Pharm.D., BCPS []  Alycia Rossetti, Pharm.D., BCPS []  [place], Pharm.D., BCPS, AAHIVP []  Legrand Como, Pharm.D., BCPS, AAHIVP []  Isac Sarna, Pharm.D., BCPS X  [place], Pharm.D.  Positive Urine culture, >/= 100,000 colonies -> Proteus Mirabilis Treated with Levofloxacin, organism sensitive to the same and no further patient follow-up is required at this time.  Dortha Kern 09/17/2014, 1:36 PM

## 2014-09-22 ENCOUNTER — Other Ambulatory Visit (INDEPENDENT_AMBULATORY_CARE_PROVIDER_SITE_OTHER): Payer: Medicare Other

## 2014-09-22 ENCOUNTER — Ambulatory Visit (INDEPENDENT_AMBULATORY_CARE_PROVIDER_SITE_OTHER): Payer: Medicare Other | Admitting: Endocrinology

## 2014-09-22 ENCOUNTER — Ambulatory Visit: Payer: Medicare Other | Admitting: Endocrinology

## 2014-09-22 ENCOUNTER — Encounter: Payer: Self-pay | Admitting: Endocrinology

## 2014-09-22 VITALS — BP 134/70 | HR 72 | Temp 98.3°F | Resp 14 | Ht 67.0 in | Wt 129.6 lb

## 2014-09-22 DIAGNOSIS — N390 Urinary tract infection, site not specified: Secondary | ICD-10-CM

## 2014-09-22 DIAGNOSIS — E041 Nontoxic single thyroid nodule: Secondary | ICD-10-CM

## 2014-09-22 DIAGNOSIS — E871 Hypo-osmolality and hyponatremia: Secondary | ICD-10-CM

## 2014-09-22 DIAGNOSIS — E7800 Pure hypercholesterolemia, unspecified: Secondary | ICD-10-CM

## 2014-09-22 DIAGNOSIS — I10 Essential (primary) hypertension: Secondary | ICD-10-CM

## 2014-09-22 DIAGNOSIS — R600 Localized edema: Secondary | ICD-10-CM

## 2014-09-22 LAB — URINALYSIS, ROUTINE W REFLEX MICROSCOPIC
Bilirubin Urine: NEGATIVE
Hgb urine dipstick: NEGATIVE
KETONES UR: NEGATIVE
Leukocytes, UA: NEGATIVE
NITRITE: NEGATIVE
PH: 7 (ref 5.0–8.0)
SPECIFIC GRAVITY, URINE: 1.01 (ref 1.000–1.030)
Total Protein, Urine: NEGATIVE
Urine Glucose: NEGATIVE
Urobilinogen, UA: 0.2 (ref 0.0–1.0)

## 2014-09-22 MED ORDER — INDAPAMIDE 1.25 MG PO TABS: 1.2500 mg | ORAL_TABLET | Freq: Every day | ORAL | Status: DC

## 2014-09-22 NOTE — Progress Notes (Signed)
Patient ID: Joanna [name], female   DOB: 12-11-1928, 81 y.o.   MRN: [id number]    Chief complaint: Follow-up of various problems   History of Present Illness:   1.  She was recently seen in the emergency room for urinary tract infection.  Although she was given Levaquin and her Escherichia coli infection was sensitive to all antibiotics he still think she has some burning sensation Apparently has been dispensed by her urologist and does not go back there   2.  Weight loss: She had lost a significant amount of weight in 2015 but it appears to have leveled off. She is not complaining of any decreased appetite or any GI symptoms  Wt Readings from Last 3 Encounters:  09/22/14 129 lb 9.6 oz (58.786 kg)  08/13/14 123 lb (55.792 kg)  06/24/14 128 lb (58.06 kg)     3.  Has history of hypothyroidism, mild in the past and TSH has been consistently normal with taking 62.5 mcg daily.  No unusual fatigue. She is taking the brand name Synthroid and takes it regularly in the mornings     Lab Results  Component Value Date   TSH 1.00 04/30/2014    4. HYPERTENSION: She has had long-standing hypertension with good control and requiring small doses of metoprolol and losartan.   Does not monitor at home.  She thinks she ran out of losartan but started back a couple of days ago  5.  She is asking about the swelling on her legs which she thinks is very recent.  She says she tends to drink a lot of water        Medication List       This list is accurate as of: 09/22/14  9:08 PM.  Always use your most recent med list.               b complex vitamins tablet  Take 1 tablet by mouth every morning.     cholecalciferol 1000 UNITS tablet  Commonly known as:  VITAMIN D  Take 1,000 Units by mouth daily.     fish oil-omega-3 fatty acids 1000 MG capsule  Take 2 g by mouth daily.     gabapentin 300 MG capsule  Commonly known as:  NEURONTIN  Take 1- 2 capsules by mouth up to 3 times daily  as needed     indapamide 1.25 MG tablet  Commonly known as:  LOZOL  Take 1 tablet (1.25 mg total) by mouth daily.     losartan 50 MG tablet  Commonly known as:  COZAAR  Take 1 tablet (50 mg total) by mouth every evening.     metoprolol succinate 50 MG 24 hr tablet  Commonly known as:  TOPROL-XL  Take 1 tablet (50 mg total) by mouth daily. Take with or immediately following a meal.     mometasone-formoterol 200-5 MCG/ACT Aero  Commonly known as:  DULERA  Inhale 2 puffs into the lungs 2 (two) times daily.     multivitamin with minerals Tabs tablet  Take 1 tablet by mouth every morning.     naphazoline-pheniramine 0.025-0.3 % ophthalmic solution  Commonly known as:  NAPHCON-A  Place 2 drops into both eyes 4 (four) times daily as needed for irritation or allergies.     nortriptyline 25 MG capsule  Commonly known as:  PAMELOR  Take 1 capsule by mouth at  bedtime     phenazopyridine 200 MG tablet  Commonly known as:  PYRIDIUM  Take 1 tablet (200 mg total) by mouth 3 (three) times daily.     PROAIR HFA 108 (90 BASE) MCG/ACT inhaler  Generic drug:  albuterol  Inhale 2 puffs into the lungs every 6 (six) hours as needed for wheezing or shortness of breath.     simvastatin 20 MG tablet  Commonly known as:  ZOCOR  Take 1 tablet by mouth  every evening     solifenacin 5 MG tablet  Commonly known as:  VESICARE  Take 1 tablet (5 mg total) by mouth daily.     SYNTHROID 125 MCG tablet  Generic drug:  levothyroxine  Take one-half tablet by  mouth daily     vitamin C 500 MG tablet  Commonly known as:  ASCORBIC ACID  Take 500 mg by mouth daily.     zoledronic acid 5 MG/100ML Soln injection  Commonly known as:  RECLAST  Inject 100 mLs (5 mg total) into the vein once.        Allergies:  Allergies  Allergen Reactions  . Cefuroxime Axetil Hives    Not sure.. Patient can be allergic to it today but a year from now she may not be.  . Lactose Intolerance (Gi) Other (See  Comments)    Gi upset  . Clarithromycin Palpitations  . Codeine Palpitations  . Sulfonamide Derivatives Palpitations    Past Medical History  Diagnosis Date  . IBS (irritable bowel syndrome)   . Hypothyroid   . GERD (gastroesophageal reflux disease)   . Hypertension   . Allergic rhinitis   . Asthma   . Complication of anesthesia     confusion after 04/2012    Past Surgical History  Procedure Laterality Date  . Mastectomy      double  . Bladder surgery    . Nasal sinus surgery    . Hip arthroplasty Left 05/04/2012    Procedure: ARTHROPLASTY BIPOLAR HIP;  Surgeon: Mauri Pole, MD;  Location: [facility];  Service: Orthopedics;  Laterality: Left;  . Closed reduction wrist fracture Left 05/04/2012    Procedure: CLOSED REDUCTION WRIST;  Surgeon: Mauri Pole, MD;  Location: [facility];  Service: Orthopedics;  Laterality: Left;  with casting  . Open reduction internal fixation (orif) distal radial fracture Left 05/29/2012    Procedure: OPEN REDUCTION INTERNAL FIXATION (ORIF) DISTAL RADIAL FRACTURE;  Surgeon: Linna Hoff, MD;  Location: [facility];  Service: Orthopedics;  Laterality: Left;  . Foot surgery    . Abdominal hysterectomy      Family History  Problem Relation Age of Onset  . CAD Mother   . Hypertension Father   . Breast cancer Sister     Social History:  reports that she has never smoked. She has never used smokeless tobacco. She reports that she does not drink alcohol or use illicit drugs.  Review of Systems    History of hyponatremia:  Lab Results  Component Value Date   CREATININE 1.35* 09/15/2014   BUN 31* 09/15/2014   NA 133* 09/15/2014   K 4.0 09/15/2014   CL 101 09/15/2014   CO2 27 09/15/2014    IDIOPATHIC neuropathy: She has had the symptom of paresthesias in her feet and legs , she is taking up to 4 gabapentin a day with relief   History of osteoporosis/osteopenia. She had a bone density in 2014 which showed osteopenia with T score -2.3. She had refused  to take Fosamax. She did get a Reclast infusion without any side effects in  01/2013 She is taking her calcium and vitamin D preparation as well as 1000 units of vitamin D 3. Vitamin D level is normal  Had  multiple fractures from fall in 2014 Bone density 12/14 showed Osteopenia. Lowest T score of -2.3 at the right femoral neck.  She had her last infusion of Reclast in 01/2013.   She will need to get this annually because of her history of multiple fractures  No history of shortness of breath recently Has had COPD followed by pulmonologist   She has a history of hypercholesterolemia and has been taking her simvastatin regularly with adequate control  Lab Results  Component Value Date   CHOL 215* 04/30/2014   HDL 69.30 04/30/2014   LDLCALC 114* 04/30/2014   LDLDIRECT 93.8 11/05/2012   TRIG 158.0* 04/30/2014   CHOLHDL 3 04/30/2014    History of chronic insomnia. She has tried various hypnotics for this previously and had been taking only nortriptyline    THYROID nodule: She had a biopsy which was benign, she thinks her nodule is slightly larger  MEDICATIONS were reviewed with Asian in detail  Exam:  BP 134/70 mmHg  Pulse 72  Temp(Src) 98.3 F (36.8 C)  Resp 14  Ht 5\' 7"  (1.702 m)  Wt 129 lb 9.6 oz (58.786 kg)  BMI 20.29 kg/m2  SpO2 95%   REPEAT blood pressure 142/70  Heart sounds normal and regular   1+ pedal edema present  She has a smooth slightly firm 3 cm thyroid nodule in the isthmus and to the left   Biceps reflexes appear normal  Assessment/Plan:   Multiple problems as detailed in history of present illness and review of systems  UTI: Although she had adequate treatment will check her urinalysis and she has some dysuria Also has history of overactive bladder  Mild hyponatremia: Asymptomatic, mild and stable.  Unclear etiology.  Advised her to avoid excessive fluids since she is drinking more water Will check labs on her next visit  HYPERTENSION: Since  she is having a slight increase in her creatinine as well as edema will change her losartan to Lozol 1.25 mg daily but will need follow-up electrolytes in the short-term  THYROID nodule: She has a benign nodule and reassured her that no further action needed, discussed nature of thyroid nodules and natural history HYPOTHYROID: We'll need TSH on her next visit  Osteopenia with history of fractures: She had Reclast in 1/15 and will plan on repeating this  Her various diagnoses and medications, treatment plan and evaluation were discussed with her and her daughter today  Total visit time including review of multiple problems, coordination of care, medication review, review of labs in counseling = 25 minutes  Yarelin Reichardt

## 2014-09-22 NOTE — Patient Instructions (Signed)
Stop losartan   Start Indapamide daily

## 2014-09-23 ENCOUNTER — Other Ambulatory Visit: Payer: Self-pay | Admitting: *Deleted

## 2014-09-23 ENCOUNTER — Telehealth: Payer: Self-pay | Admitting: *Deleted

## 2014-09-23 MED ORDER — CIPROFLOXACIN HCL 250 MG PO TABS: 250.0000 mg | ORAL_TABLET | Freq: Two times a day (BID) | ORAL | Status: DC

## 2014-09-23 MED ORDER — NORFLOXACIN 400 MG PO TABS: ORAL_TABLET | ORAL | Status: DC

## 2014-09-23 NOTE — Telephone Encounter (Signed)
Noted, rx sent.

## 2014-09-23 NOTE — Telephone Encounter (Signed)
Dr. Dwyane Dee sent in an rx for Noroxin for this patients urine infection, pharmacist from [facility] called back and said this drug is no longer available, could you please advise what to send in to replace this?   Pharmacist said cipro or levaquin.

## 2014-09-23 NOTE — Telephone Encounter (Signed)
OK to call in Ciprofloxacin 250 mg bid x 3 days. If sxs persist, let us know.

## 2014-09-24 ENCOUNTER — Ambulatory Visit (INDEPENDENT_AMBULATORY_CARE_PROVIDER_SITE_OTHER): Payer: Medicare Other | Admitting: Adult Health

## 2014-09-24 ENCOUNTER — Encounter: Payer: Self-pay | Admitting: Adult Health

## 2014-09-24 VITALS — BP 128/74 | HR 73 | Temp 97.3°F | Ht 67.0 in | Wt 128.0 lb

## 2014-09-24 DIAGNOSIS — J4530 Mild persistent asthma, uncomplicated: Secondary | ICD-10-CM

## 2014-09-24 DIAGNOSIS — Z79899 Other long term (current) drug therapy: Secondary | ICD-10-CM | POA: Diagnosis not present

## 2014-09-24 DIAGNOSIS — Z23 Encounter for immunization: Secondary | ICD-10-CM

## 2014-09-24 NOTE — Progress Notes (Signed)
Subjective:    Patient ID: Joanna [name], female    DOB: Apr 09, 1928, 81 y.o.   MRN: [id number]  HPI 81 year old never smoker with a history of hypertension, irritable bowel syndrome, and followed by Dr. Gwenette Greet for for asthma and allergic rhinitis.  She has previously been on Xolair but this was apparently discontinued in June 2015. Transitioned to Dr. Lamonte Sakai   prior PFT from 2006, shows mild AFL, hyperinflation. S  09/24/2014  Follow up : Asthma and Med Review  Returns for 2 month follow up .  She remains on on Dulera Twice daily  . Says it really is helping her breathing  No flare of cough or wheezing   We reviewed her meds. She seems somewhat confused with her meds . Has multiple vitamins and supplements , that she says she needs everyday.  Pt education was given with a med calendar sheet.  Denies chest pain, orthopnea , hemoptysis , edema.       Review of Systems As per HPI   Past Medical History  Diagnosis Date  . IBS (irritable bowel syndrome)   . Hypothyroid   . GERD (gastroesophageal reflux disease)   . Hypertension   . Allergic rhinitis   . Asthma   . Complication of anesthesia     confusion after 04/2012     Family History  Problem Relation Age of Onset  . CAD Mother   . Hypertension Father   . Breast cancer Sister      Social History   Social History  . Marital Status: Widowed    Spouse Name: N/A  . Number of Children: 2  . Years of Education: N/A   Occupational History  . unemployed    Social History Main Topics  . Smoking status: Never Smoker   . Smokeless tobacco: Never Used  . Alcohol Use: No  . Drug Use: No  . Sexual Activity: Not on file   Other Topics Concern  . Not on file   Social History Narrative     Allergies  Allergen Reactions  . Cefuroxime Axetil Hives    Not sure.. Patient can be allergic to it today but a year from now she may not be.  . Lactose Intolerance (Gi) Other (See Comments)    Gi upset  . Clarithromycin  Palpitations  . Codeine Palpitations  . Sulfonamide Derivatives Palpitations     Outpatient Prescriptions Prior to Visit  Medication Sig Dispense Refill  . albuterol (PROAIR HFA) 108 (90 BASE) MCG/ACT inhaler Inhale 2 puffs into the lungs every 6 (six) hours as needed for wheezing or shortness of breath.     Marland Kitchen b complex vitamins tablet Take 1 tablet by mouth every morning.    . cholecalciferol (VITAMIN D) 1000 UNITS tablet Take 1,000 Units by mouth daily.    . ciprofloxacin (CIPRO) 250 MG tablet Take 1 tablet (250 mg total) by mouth 2 (two) times daily. 6 tablet 0  . fish oil-omega-3 fatty acids 1000 MG capsule Take 2 g by mouth daily.    Marland Kitchen gabapentin (NEURONTIN) 300 MG capsule Take 1- 2 capsules by mouth up to 3 times daily as needed 150 capsule 3  . indapamide (LOZOL) 1.25 MG tablet Take 1 tablet (1.25 mg total) by mouth daily. 30 tablet 1  . losartan (COZAAR) 50 MG tablet Take 1 tablet (50 mg total) by mouth every evening. 90 tablet 1  . metoprolol succinate (TOPROL-XL) 50 MG 24 hr tablet Take 1 tablet (50 mg total)  by mouth daily. Take with or immediately following a meal. 90 tablet 3  . mometasone-formoterol (DULERA) 200-5 MCG/ACT AERO Inhale 2 puffs into the lungs 2 (two) times daily. 2 Inhaler 0  . Multiple Vitamin (MULTIVITAMIN WITH MINERALS) TABS Take 1 tablet by mouth every morning.    . naphazoline-pheniramine (NAPHCON-A) 0.025-0.3 % ophthalmic solution Place 2 drops into both eyes 4 (four) times daily as needed for irritation or allergies.    Marland Kitchen norfloxacin (NOROXIN) 400 MG TABS tablet Take 1 tablet twice a day for 5 days. 10 each 0  . nortriptyline (PAMELOR) 25 MG capsule Take 1 capsule by mouth at  bedtime 90 capsule 1  . phenazopyridine (PYRIDIUM) 200 MG tablet Take 1 tablet (200 mg total) by mouth 3 (three) times daily. 6 tablet 0  . simvastatin (ZOCOR) 20 MG tablet Take 1 tablet by mouth  every evening 90 tablet 1  . solifenacin (VESICARE) 5 MG tablet Take 1 tablet (5 mg total)  by mouth daily. 30 tablet 3  . SYNTHROID 125 MCG tablet Take one-half tablet by  mouth daily 45 tablet 2  . vitamin C (ASCORBIC ACID) 500 MG tablet Take 500 mg by mouth daily.    . zoledronic acid (RECLAST) 5 MG/100ML SOLN injection Inject 100 mLs (5 mg total) into the vein once. 100 mL 0   No facility-administered medications prior to visit.         Objective:   Physical Exam  Filed Vitals:   09/24/14 1407  BP: 128/74  [name]: 73  Temp: 97.3 F (36.3 C)  TempSrc: Oral  Height: 5\' 7"  (1.702 m)  Weight: 128 lb (58.06 kg)  SpO2: 98%   Gen: Pleasant, thin elderly woman, in no distress,  normal affect  ENT: No lesions,  mouth clear,  oropharynx clear, no postnasal drip  Neck: No JVD, no TMG, no carotid bruits  Lungs: No use of accessory muscles,  Distant, clear without rales or rhonchi  Cardiovascular: RRR, heart sounds normal, no murmur or gallops, tr  peripheral edema  Musculoskeletal: No deformities, no cyanosis or clubbing  Neuro: alert, non focal  Skin: Warm, no lesions or rashes      Assessment & Plan:  No problem-specific assessment & plan notes found for this encounter.

## 2014-09-24 NOTE — Patient Instructions (Signed)
Follow med calendar closely and bring to each visit.  Flu shot today  follow up Dr. Lamonte Sakai  In 4 months and As needed

## 2014-09-24 NOTE — Assessment & Plan Note (Signed)
Patient's medications were reviewed today and patient education was given. Computerized medication calendar was adjusted/completed  Pt education given with med calendar

## 2014-09-24 NOTE — Assessment & Plan Note (Signed)
Compensated on present regimen .  Patient's medications were reviewed today and patient education was given. Computerized medication calendar was adjusted/completed   Plan  Follow med calendar closely and bring to each visit.  Flu shot today  follow up Dr. Lamonte Sakai  In 4 months and As needed

## 2014-09-28 NOTE — Addendum Note (Signed)
Addended by: Osa Craver on: 09/28/2014 10:05 AM   Modules accepted: Orders, Medications

## 2014-10-25 ENCOUNTER — Other Ambulatory Visit: Payer: Medicare Other

## 2014-10-25 ENCOUNTER — Other Ambulatory Visit (INDEPENDENT_AMBULATORY_CARE_PROVIDER_SITE_OTHER): Payer: Medicare Other

## 2014-10-25 DIAGNOSIS — E041 Nontoxic single thyroid nodule: Secondary | ICD-10-CM | POA: Diagnosis not present

## 2014-10-25 DIAGNOSIS — I10 Essential (primary) hypertension: Secondary | ICD-10-CM

## 2014-10-25 DIAGNOSIS — Z1211 Encounter for screening for malignant neoplasm of colon: Secondary | ICD-10-CM | POA: Diagnosis not present

## 2014-10-25 DIAGNOSIS — Z1212 Encounter for screening for malignant neoplasm of rectum: Secondary | ICD-10-CM | POA: Diagnosis not present

## 2014-10-25 LAB — BASIC METABOLIC PANEL
BUN: 23 mg/dL (ref 6–23)
CALCIUM: 9.6 mg/dL (ref 8.4–10.5)
CHLORIDE: 95 meq/L — AB (ref 96–112)
CO2: 33 mEq/L — ABNORMAL HIGH (ref 19–32)
CREATININE: 0.78 mg/dL (ref 0.40–1.20)
GFR: 74.76 mL/min (ref >60.00)
Glucose, Bld: 90 mg/dL (ref 70–99)
Potassium: 4.1 mEq/L (ref 3.5–5.1)
SODIUM: 133 meq/L — AB (ref 135–145)

## 2014-10-25 LAB — LIPID PANEL
CHOLESTEROL: 224 mg/dL — AB (ref 0–200)
HDL: 73.7 mg/dL (ref >39.00)
LDL CALC: 136 mg/dL — AB (ref 0–99)
NonHDL: 150.08
TRIGLYCERIDES: 71 mg/dL (ref 0.0–149.0)
Total CHOL/HDL Ratio: 3
VLDL: 14.2 mg/dL (ref 0.0–40.0)

## 2014-10-25 LAB — TSH: TSH: 2.17 u[IU]/mL (ref 0.35–4.50)

## 2014-10-25 LAB — T4, FREE: FREE T4: 0.91 ng/dL (ref 0.60–1.60)

## 2014-10-27 ENCOUNTER — Other Ambulatory Visit: Payer: Self-pay | Admitting: *Deleted

## 2014-10-27 DIAGNOSIS — N390 Urinary tract infection, site not specified: Secondary | ICD-10-CM

## 2014-10-28 ENCOUNTER — Other Ambulatory Visit (INDEPENDENT_AMBULATORY_CARE_PROVIDER_SITE_OTHER): Payer: Medicare Other

## 2014-10-28 ENCOUNTER — Ambulatory Visit (INDEPENDENT_AMBULATORY_CARE_PROVIDER_SITE_OTHER): Payer: Medicare Other | Admitting: Endocrinology

## 2014-10-28 ENCOUNTER — Encounter: Payer: Self-pay | Admitting: Endocrinology

## 2014-10-28 VITALS — BP 136/64 | HR 72 | Temp 98.3°F | Resp 14 | Ht 67.0 in | Wt 121.2 lb

## 2014-10-28 DIAGNOSIS — N390 Urinary tract infection, site not specified: Secondary | ICD-10-CM

## 2014-10-28 DIAGNOSIS — R300 Dysuria: Secondary | ICD-10-CM | POA: Diagnosis not present

## 2014-10-28 DIAGNOSIS — N3281 Overactive bladder: Secondary | ICD-10-CM | POA: Diagnosis not present

## 2014-10-28 DIAGNOSIS — E039 Hypothyroidism, unspecified: Secondary | ICD-10-CM | POA: Diagnosis not present

## 2014-10-28 DIAGNOSIS — E871 Hypo-osmolality and hyponatremia: Secondary | ICD-10-CM

## 2014-10-28 DIAGNOSIS — E7800 Pure hypercholesterolemia, unspecified: Secondary | ICD-10-CM

## 2014-10-28 LAB — URINALYSIS, ROUTINE W REFLEX MICROSCOPIC
BILIRUBIN URINE: NEGATIVE
HGB URINE DIPSTICK: NEGATIVE
Ketones, ur: NEGATIVE
LEUKOCYTES UA: NEGATIVE
NITRITE: POSITIVE — AB
Specific Gravity, Urine: 1.01 (ref 1.000–1.030)
TOTAL PROTEIN, URINE-UPE24: NEGATIVE
URINE GLUCOSE: NEGATIVE
UROBILINOGEN UA: 0.2 (ref 0.0–1.0)
pH: 7 (ref 5.0–8.0)

## 2014-10-28 MED ORDER — DARIFENACIN HYDROBROMIDE ER 7.5 MG PO TB24: 7.5000 mg | ORAL_TABLET | Freq: Every day | ORAL | Status: DC

## 2014-10-28 NOTE — Progress Notes (Signed)
Patient ID: AMEIRA ALESSANDRINI, female   DOB: 11/13/1928, 81 y.o.   MRN: [id number]    Chief complaint: Burning with urination   History of Present Illness:   1.  She was treated with Levaquin in the ER in August However she thinks she still is having burning with urination until frequency especially at night where she has some urgency also. She is taking Azo Gantrisin OTC and she thinks the last couple of days she is feeling better but is still having nocturia.  Does not follow up with urologist anymore   2.  Weight loss: She had lost a significant amount of weight in 2015 and again recently She is not complaining of any decreased appetite or any GI symptoms For the last week or so she thinks she is having a hearty appetite; also on her last visit she was having some swelling of her legs  Wt Readings from Last 3 Encounters:  10/28/14 121 lb 3.2 oz (54.976 kg)  09/24/14 128 lb (58.06 kg)  09/22/14 129 lb 9.6 oz (58.786 kg)     3.  Has history of hypothyroidism, mild in the past and TSH has been consistently normal with taking 62.5 mcg daily.  She is taking the brand name Synthroid and takes it regularly in the mornings She has been feeling fairly good recently     Lab Results  Component Value Date   TSH 2.17 10/25/2014    4.  EDEMA: She was having mild edema of her legs and her losartan was changed to Lozol 1.25 mg daily    5.  History of hyponatremia: She says she tends to drink a lot of water.  Sodium is stable at 133 even with starting Lozol  Lab Results  Component Value Date   CREATININE 0.78 10/25/2014   BUN 23 10/25/2014   NA 133* 10/25/2014   K 4.1 10/25/2014   CL 95* 10/25/2014   CO2 33* 10/25/2014          Medication List       This list is accurate as of: 10/28/14 11:59 PM.  Always use your most recent med list.               CALCIUM 600 PO  Take 1 tablet by mouth every morning.     cholecalciferol 1000 UNITS tablet  Commonly known as:  VITAMIN D   Take 1,000 Units by mouth every morning.     darifenacin 7.5 MG 24 hr tablet  Commonly known as:  ENABLEX  Take 1 tablet (7.5 mg total) by mouth at bedtime.     dextromethorphan-guaiFENesin 30-600 MG 12hr tablet  Commonly known as:  MUCINEX DM  Take 1 tablet by mouth 2 (two) times daily.     gabapentin 300 MG capsule  Commonly known as:  NEURONTIN  Take 1- 2 capsules by mouth up to 3 times daily as needed     indapamide 1.25 MG tablet  Commonly known as:  LOZOL  Take 1 tablet (1.25 mg total) by mouth daily.     losartan 50 MG tablet  Commonly known as:  COZAAR  TK 1 T PO  QPM     metoprolol succinate 50 MG 24 hr tablet  Commonly known as:  TOPROL-XL  Take 1 tablet (50 mg total) by mouth daily. Take with or immediately following a meal.     mometasone-formoterol 200-5 MCG/ACT Aero  Commonly known as:  DULERA  Inhale 2 puffs into the lungs 2 (two)  times daily.     nortriptyline 25 MG capsule  Commonly known as:  PAMELOR  Take 1 capsule by mouth at  bedtime     pantoprazole 40 MG tablet  Commonly known as:  PROTONIX  Take 40 mg by mouth daily before breakfast.     Potassium 75 MG Tabs  Take 1 tablet by mouth every morning.     PROAIR HFA 108 (90 BASE) MCG/ACT inhaler  Generic drug:  albuterol  Inhale 2 puffs into the lungs every 4 (four) hours as needed for wheezing or shortness of breath.     simvastatin 20 MG tablet  Commonly known as:  ZOCOR  Take 1 tablet by mouth  every evening     SYNTHROID 125 MCG tablet  Generic drug:  levothyroxine  Take one-half tablet by  mouth daily     VISION FORMULA PO  Take 1 tablet by mouth every morning.     vitamin C 500 MG tablet  Commonly known as:  ASCORBIC ACID  Take 500 mg by mouth every morning.        Allergies:  Allergies  Allergen Reactions  . Cefuroxime Axetil Hives    Not sure.. Patient can be allergic to it today but a year from now she may not be.  . Lactose Intolerance (Gi) Other (See Comments)    Gi  upset  . Clarithromycin Palpitations  . Codeine Palpitations  . Sulfonamide Derivatives Palpitations    Past Medical History  Diagnosis Date  . IBS (irritable bowel syndrome)   . Hypothyroid   . GERD (gastroesophageal reflux disease)   . Hypertension   . Allergic rhinitis   . Asthma   . Complication of anesthesia     confusion after 04/2012    Past Surgical History  Procedure Laterality Date  . Mastectomy      double  . Bladder surgery    . Nasal sinus surgery    . Hip arthroplasty Left 05/04/2012    Procedure: ARTHROPLASTY BIPOLAR HIP;  Surgeon: Mauri Pole, MD;  Location: [facility];  Service: Orthopedics;  Laterality: Left;  . Closed reduction wrist fracture Left 05/04/2012    Procedure: CLOSED REDUCTION WRIST;  Surgeon: Mauri Pole, MD;  Location: [facility];  Service: Orthopedics;  Laterality: Left;  with casting  . Open reduction internal fixation (orif) distal radial fracture Left 05/29/2012    Procedure: OPEN REDUCTION INTERNAL FIXATION (ORIF) DISTAL RADIAL FRACTURE;  Surgeon: Linna Hoff, MD;  Location: [place];  Service: Orthopedics;  Laterality: Left;  . Foot surgery    . Abdominal hysterectomy      Family History  Problem Relation Age of Onset  . CAD Mother   . Hypertension Father   . Breast cancer Sister     Social History:  reports that she has never smoked. She has never used smokeless tobacco. She reports that she does not drink alcohol or use illicit drugs.  Review of Systems    IDIOPATHIC neuropathy: She has had the symptom of paresthesias in her feet and legs , she is taking up to 4 gabapentin a day with relief   History of osteoporosis/osteopenia. She had a bone density in 2014 which showed osteopenia with T score -2.3. She had refused to take Fosamax. She did get a Reclast infusion without any side effects in 01/2013 She is taking her calcium and vitamin D preparation as well as 1000 units of vitamin D 3. Vitamin D level is normal  Had  multiple  fractures from fall in 2014 Bone density 12/14 showed Osteopenia. Lowest T score of -2.3 at the right femoral neck.  She had her last infusion of Reclast in 01/2013.   She will need to get this annually because of her history of multiple fractures  Has had COPD followed by pulmonologist   She has a history of hypercholesterolemia and has been taking her simvastatin but recently has been somewhat irregular with this and her LDL is higher   Lab Results  Component Value Date   CHOL 224* 10/25/2014   HDL 73.70 10/25/2014   LDLCALC 136* 10/25/2014   LDLDIRECT 93.8 11/05/2012   TRIG 71.0 10/25/2014   CHOLHDL 3 10/25/2014    History of chronic insomnia. She has tried various hypnotics for this previously and had been taking only nortriptyline    THYROID nodule: She had a biopsy which was benign   Exam:  BP 136/64 mmHg  Pulse 72  Temp(Src) 98.3 F (36.8 C)  Resp 14  Ht 5\' 7"  (1.702 m)  Wt 121 lb 3.2 oz (54.976 kg)  BMI 18.98 kg/m2  SpO2 95%   No edema present  Assessment/Plan:   Multiple problems as detailed in history of present illness and review of systems  ?  UTI: She has had some dysuria but this is recently better Will check urinalysis and culture and decide on any further treatment  Overactive bladder: Appears to have symptoms of frequency, nocturia and urgent continence and will try and elects for now Not clear what she had taken from her urologist previously  Mild hyponatremia: Asymptomatic, mild and stable.  Unclear etiology.   Advised her to not be drinking extra amounts of water  HYPERTENSION: Since she is having good control of switching losartan to Lozol and creatinine is slightly better  HYPERCHOLESTEROLEMIA: LDL is 136, she is irregular with her simvastatin and advised her to take it daily  HYPOTHYROID: TSH normal on current regimen   Xzavian Semmel

## 2014-11-01 ENCOUNTER — Other Ambulatory Visit: Payer: Self-pay | Admitting: *Deleted

## 2014-11-01 ENCOUNTER — Telehealth: Payer: Self-pay | Admitting: *Deleted

## 2014-11-01 MED ORDER — INDAPAMIDE 1.25 MG PO TABS: 1.2500 mg | ORAL_TABLET | Freq: Every day | ORAL | Status: DC

## 2014-11-01 NOTE — Telephone Encounter (Signed)
Patients daughter in law was calling for Joanna [name], she wanted her U/A results please.

## 2014-11-01 NOTE — Telephone Encounter (Signed)
Daughter in law is aware 

## 2014-11-01 NOTE — Telephone Encounter (Signed)
Negative, please check with lab if urine culture was sent.  No need for antibiotics at this time

## 2014-11-23 ENCOUNTER — Other Ambulatory Visit: Payer: Self-pay | Admitting: *Deleted

## 2014-11-23 ENCOUNTER — Telehealth: Payer: Self-pay | Admitting: Endocrinology

## 2014-11-23 MED ORDER — GABAPENTIN 300 MG PO CAPS: ORAL_CAPSULE | ORAL | Status: DC

## 2014-11-23 NOTE — Telephone Encounter (Signed)
rx sent

## 2014-11-23 NOTE — Telephone Encounter (Signed)
Patients daughter called and would like her Rx sent to her pharmacy   Rx: Gabapentin   Pharmacy: [email or web address]  Please advise

## 2014-11-29 ENCOUNTER — Telehealth: Payer: Self-pay | Admitting: Emergency Medicine

## 2014-11-29 NOTE — Telephone Encounter (Signed)
LM for Lady Gary x 1    Maurice March, RN at 08/23/2014 10:09 AM     Status: Signed       Expand All Collapse All   Called and spoke to pt's daughter in law. Informed her of the results and recs per RB. Order placed for f/u CT. Pt verbalized understanding and denied any further questions or concerns at this time.              Collene Gobble, MD at 08/23/2014 9:52 AM     Status: Signed       Expand All Collapse All   I reviewed patient's CT abdomen again today. It shows old LLL scar and what appears to be a rounded L basilar area most consistent with rounded atelectasis that has changed some compared with prior scan in 2012. I believe it would be appropriate for [place] to follow this area on a repeat Ct scan of the chest, no contrast, in 6 months (that would be in December 2016) to make sure it is not changing. Please let her know that I would like to get a CT chest in December

## 2014-11-29 NOTE — Telephone Encounter (Signed)
Lady Gary returned call, can be reached at [phone].

## 2014-11-29 NOTE — Telephone Encounter (Signed)
Spoke with pt daughter-in-law Estill Bamberg, aware of why CT scan ordered.  Nothing further needed.

## 2014-12-13 ENCOUNTER — Other Ambulatory Visit: Payer: Medicare Other

## 2014-12-13 ENCOUNTER — Ambulatory Visit (INDEPENDENT_AMBULATORY_CARE_PROVIDER_SITE_OTHER)
Admission: RE | Admit: 2014-12-13 | Discharge: 2014-12-13 | Disposition: A | Discharge status: Home / Self Care | Payer: Medicare Other | Source: Ambulatory Visit | Attending: Emergency Medicine | Admitting: Emergency Medicine

## 2014-12-13 DIAGNOSIS — J9811 Atelectasis: Secondary | ICD-10-CM | POA: Diagnosis not present

## 2014-12-13 DIAGNOSIS — J4520 Mild intermittent asthma, uncomplicated: Secondary | ICD-10-CM

## 2014-12-20 ENCOUNTER — Telehealth: Payer: Self-pay | Admitting: Nutrition

## 2014-12-20 NOTE — Telephone Encounter (Signed)
Please call evelyn back [phone] to reschedule reclast tomorrow. Thank you!

## 2014-12-21 ENCOUNTER — Ambulatory Visit: Payer: Medicare Other

## 2014-12-24 DIAGNOSIS — Z96642 Presence of left artificial hip joint: Secondary | ICD-10-CM | POA: Diagnosis not present

## 2014-12-24 DIAGNOSIS — Z471 Aftercare following joint replacement surgery: Secondary | ICD-10-CM | POA: Diagnosis not present

## 2014-12-24 DIAGNOSIS — M25552 Pain in left hip: Secondary | ICD-10-CM | POA: Diagnosis not present

## 2015-01-11 DIAGNOSIS — M1612 Unilateral primary osteoarthritis, left hip: Secondary | ICD-10-CM | POA: Diagnosis not present

## 2015-01-24 ENCOUNTER — Other Ambulatory Visit: Payer: Medicare Other

## 2015-01-25 ENCOUNTER — Other Ambulatory Visit: Payer: Medicare Other

## 2015-01-25 ENCOUNTER — Ambulatory Visit: Payer: Medicare Other | Admitting: Emergency Medicine

## 2015-01-26 ENCOUNTER — Telehealth: Payer: Self-pay | Admitting: Nutrition

## 2015-01-26 NOTE — Telephone Encounter (Signed)
Phone Call to patient to schedule lab work for reclast infusion. Patient said she has been very sick and would prefer to wait to schedule the lab appointment until she feels better.  She was told to call here to schedule that.  She agreed to do it.

## 2015-01-28 ENCOUNTER — Ambulatory Visit: Payer: Medicare Other | Admitting: Endocrinology

## 2015-02-03 DIAGNOSIS — H34232 Retinal artery branch occlusion, left eye: Secondary | ICD-10-CM | POA: Diagnosis not present

## 2015-02-15 NOTE — Telephone Encounter (Signed)
I phoned her on 12/6.  She reported that she was sick with the flu and will call me when she feels better after the holidays.

## 2015-03-01 ENCOUNTER — Other Ambulatory Visit: Payer: Self-pay

## 2015-03-10 ENCOUNTER — Other Ambulatory Visit: Payer: Self-pay | Admitting: Endocrinology

## 2015-03-15 ENCOUNTER — Encounter: Payer: Self-pay | Admitting: Emergency Medicine

## 2015-03-15 ENCOUNTER — Ambulatory Visit (INDEPENDENT_AMBULATORY_CARE_PROVIDER_SITE_OTHER): Payer: Medicare Other | Admitting: Emergency Medicine

## 2015-03-15 VITALS — BP 150/76 | HR 78 | Wt 131.0 lb

## 2015-03-15 DIAGNOSIS — J4530 Mild persistent asthma, uncomplicated: Secondary | ICD-10-CM

## 2015-03-15 NOTE — Patient Instructions (Signed)
Please continue Dulera 2 puffs twice a day. Rinse your mouth after using.  Keep Albuterol available to use if needed for shortness of breath.  PLEASE BRING ALL YOUR MEDICATIONS WITH YOU TO YOUR FUTURE VISITS.  Follow with Dr Lamonte Sakai in 6 months or sooner if you have any problems

## 2015-03-15 NOTE — Progress Notes (Signed)
   Subjective:    Patient ID: Joanna [name], female    DOB: Feb 11, 1928, 82 y.o.   MRN: [id number]  HPI 82 year old never smoker with a history of hypertension, irritable bowel syndrome, and followed by Dr. Gwenette Greet for for asthma and allergic rhinitis. She has previously been on Xolair but this was apparently discontinued in June 2015. She is currently on Dulera bid, albuterol when necessary.  I reviewed her prior PFT from 2006, shows mild AFL, hyperinflation. She feels that she has benefited from the [facility]. She denies significant trouble breathing. She tells me that she doesn't have any albuterol at this time. She is unclear about the names of her inhalers, but as best I can tell she is using the [facility] on a schedule. She began to have some cough beginning 5 days ago. Non-productive. She is able to shop and walk through the store.   ROV 03/15/15 -- patient with a history of asthma and allergic rhinitis. She used to be on Xolair in 2015, not currently. She has some degree of confusion about her medications and she came to see TP for a medication review and calendar.  She says that her breathing has ups and downs.  She believes that she is doing better on dulera.  She does not always take it twice a day.  No exacerbations or flares reported. No CP. She does have occasional cough - brings up clear mucous.    Review of Systems As per HPI       Objective:   Physical Exam  Filed Vitals:   03/15/15 1554  BP: 150/76  [name]: 78  Weight: 131 lb (59.421 kg)  SpO2: 96%   Gen: Pleasant, thin elderly woman, in no distress,  normal affect  ENT: No lesions,  mouth clear,  oropharynx clear, no postnasal drip  Neck: No JVD, no TMG, no carotid bruits  Lungs: No use of accessory muscles,  Distant, clear without rales or rhonchi  Cardiovascular: RRR, heart sounds normal, no murmur or gallops, no peripheral edema  Musculoskeletal: No deformities, no cyanosis or clubbing  Neuro: alert, non  focal  Skin: Warm, no lesions or rashes      Assessment & Plan:  Extrinsic asthma Ms. Keaney is quite confused about her medications. She does have some degree of dementia. I am convinced however based on her explanation that she is taking dulera, usually twice a day but occasionally only once a day. He believes that it has helped her significantly. In particular it helps her clear and manage her secretions. She has not had any flares. She does not use albuterol now that she is on the dulera. We will continue this regimen. Asked her to bring all of her medications with her to any future visits   Baltazar Apo, MD, PhD 03/15/2015, 4:05 PM [facility] [phone] or if no answer [phone]

## 2015-03-15 NOTE — Assessment & Plan Note (Addendum)
Joanna [name] is quite confused about her medications. She does have some degree of dementia. I am convinced however based on her explanation that she is taking dulera, usually twice a day but occasionally only once a day. He believes that it has helped her significantly. In particular it helps her clear and manage her secretions. She has not had any flares. She does not use albuterol now that she is on the dulera. We will continue this regimen. Asked her to bring all of her medications with her to any future visits

## 2015-03-25 ENCOUNTER — Encounter: Payer: Self-pay | Admitting: *Deleted

## 2015-03-28 ENCOUNTER — Telehealth: Payer: Self-pay | Admitting: *Deleted

## 2015-03-28 ENCOUNTER — Telehealth: Payer: Self-pay | Admitting: Endocrinology

## 2015-03-28 MED ORDER — SYNTHROID 125 MCG PO TABS: ORAL_TABLET | ORAL | Status: DC

## 2015-03-28 MED ORDER — NORTRIPTYLINE HCL 25 MG PO CAPS: ORAL_CAPSULE | ORAL | Status: DC

## 2015-03-28 MED ORDER — INDAPAMIDE 1.25 MG PO TABS: 1.2500 mg | ORAL_TABLET | Freq: Every day | ORAL | Status: DC

## 2015-03-28 NOTE — Telephone Encounter (Signed)
Joanna [name] called for PT she said that the pharmacy told her that the refills requested were denied.  She set up follow up appt with Dr. Dwyane Dee for April and said that she wants to make sure that pt can get refills of the Synthroid, Indapanide, and Nortryptyline called into the pharmacy because she is out.

## 2015-03-28 NOTE — Telephone Encounter (Signed)
rx's were denied on 03/10/15 because she has not been here, rx's were sent for a 30 day supply only to the local drug store until she is seen in the office.

## 2015-03-30 ENCOUNTER — Telehealth: Payer: Self-pay | Admitting: Emergency Medicine

## 2015-03-30 MED ORDER — MOMETASONE FURO-FORMOTEROL FUM 200-5 MCG/ACT IN AERO: 2.0000 | INHALATION_SPRAY | Freq: Two times a day (BID) | RESPIRATORY_TRACT | Status: DC

## 2015-03-30 NOTE — Telephone Encounter (Signed)
[phone] calling back Lady [place]

## 2015-03-30 NOTE — Telephone Encounter (Signed)
LVM for pt to return call

## 2015-03-30 NOTE — Telephone Encounter (Signed)
Called and spoke with Joanna [name]. She states that the pt needs a refill request for [facility]. I informed her that the rx would be sent today. She voiced understanding and had no further questions. Rx sent. Nothing further needed.

## 2015-04-06 ENCOUNTER — Telehealth: Payer: Self-pay | Admitting: Emergency Medicine

## 2015-04-06 NOTE — Telephone Encounter (Signed)
LMTCB

## 2015-04-07 NOTE — Telephone Encounter (Signed)
LM for Evelyn x 1

## 2015-04-07 NOTE — Telephone Encounter (Signed)
LMTCB for Joanna [name]

## 2015-04-07 NOTE — Telephone Encounter (Signed)
Estill Bamberg returned call, CB is [phone]

## 2015-04-08 NOTE — Telephone Encounter (Signed)
Spoke with Estill Bamberg, pt's daughter  She states that pt is needing PA for [facility]  I called and initiated PA and it was approved until 01/15/16 [facility] aware and nothing further needed

## 2015-04-08 NOTE — Telephone Encounter (Signed)
lmtcb X2 for [employer]

## 2015-04-08 NOTE — Telephone Encounter (Signed)
Return call from evelyn can be reached @ same #.Joanna [name]

## 2015-04-19 ENCOUNTER — Other Ambulatory Visit (INDEPENDENT_AMBULATORY_CARE_PROVIDER_SITE_OTHER): Payer: Medicare Other

## 2015-04-19 DIAGNOSIS — E7800 Pure hypercholesterolemia, unspecified: Secondary | ICD-10-CM

## 2015-04-19 DIAGNOSIS — N390 Urinary tract infection, site not specified: Secondary | ICD-10-CM

## 2015-04-19 DIAGNOSIS — E871 Hypo-osmolality and hyponatremia: Secondary | ICD-10-CM

## 2015-04-19 LAB — URINALYSIS, ROUTINE W REFLEX MICROSCOPIC
BILIRUBIN URINE: NEGATIVE
HGB URINE DIPSTICK: NEGATIVE
KETONES UR: NEGATIVE
LEUKOCYTES UA: NEGATIVE
NITRITE: NEGATIVE
SPECIFIC GRAVITY, URINE: 1.01 (ref 1.000–1.030)
Total Protein, Urine: NEGATIVE
URINE GLUCOSE: NEGATIVE
UROBILINOGEN UA: 0.2 (ref 0.0–1.0)
pH: 7.5 (ref 5.0–8.0)

## 2015-04-19 LAB — COMPREHENSIVE METABOLIC PANEL
ALBUMIN: 3.9 g/dL (ref 3.5–5.2)
ALT: 12 U/L (ref 0–35)
AST: 21 U/L (ref 0–37)
Alkaline Phosphatase: 44 U/L (ref 39–117)
BILIRUBIN TOTAL: 0.7 mg/dL (ref 0.2–1.2)
BUN: 21 mg/dL (ref 6–23)
CALCIUM: 9.8 mg/dL (ref 8.4–10.5)
CO2: 33 meq/L — AB (ref 19–32)
CREATININE: 0.91 mg/dL (ref 0.40–1.20)
Chloride: 94 mEq/L — ABNORMAL LOW (ref 96–112)
GFR: 62.5 mL/min (ref >60.00)
Glucose, Bld: 90 mg/dL (ref 70–99)
Potassium: 3.6 mEq/L (ref 3.5–5.1)
Sodium: 132 mEq/L — ABNORMAL LOW (ref 135–145)
Total Protein: 6.6 g/dL (ref 6.0–8.3)

## 2015-04-19 LAB — CBC
HCT: 35.7 % — ABNORMAL LOW (ref 36.0–46.0)
Hemoglobin: 12.1 g/dL (ref 12.0–15.0)
MCHC: 33.8 g/dL (ref 30.0–36.0)
MCV: 86.8 fl (ref 78.0–100.0)
PLATELETS: 290 10*3/uL (ref 150.0–400.0)
RBC: 4.11 Mil/uL (ref 3.87–5.11)
RDW: 13.8 % (ref 11.5–15.5)
WBC: 6.7 10*3/uL (ref 4.0–10.5)

## 2015-04-20 ENCOUNTER — Other Ambulatory Visit: Payer: Medicare Other

## 2015-04-20 LAB — LIPID PANEL
CHOLESTEROL: 166 mg/dL (ref 0–200)
HDL: 66.6 mg/dL (ref >39.00)
LDL Cholesterol: 87 mg/dL (ref 0–99)
NonHDL: 99.62
TRIGLYCERIDES: 64 mg/dL (ref 0.0–149.0)
Total CHOL/HDL Ratio: 2
VLDL: 12.8 mg/dL (ref 0.0–40.0)

## 2015-04-22 ENCOUNTER — Other Ambulatory Visit: Payer: Self-pay | Admitting: *Deleted

## 2015-04-22 ENCOUNTER — Other Ambulatory Visit (INDEPENDENT_AMBULATORY_CARE_PROVIDER_SITE_OTHER): Payer: Medicare Other

## 2015-04-22 ENCOUNTER — Telehealth: Payer: Self-pay | Admitting: Endocrinology

## 2015-04-22 ENCOUNTER — Encounter: Payer: Self-pay | Admitting: Endocrinology

## 2015-04-22 ENCOUNTER — Ambulatory Visit (INDEPENDENT_AMBULATORY_CARE_PROVIDER_SITE_OTHER): Payer: Medicare Other | Admitting: Endocrinology

## 2015-04-22 ENCOUNTER — Other Ambulatory Visit: Payer: Self-pay

## 2015-04-22 VITALS — BP 124/58 | HR 68 | Temp 97.7°F | Resp 14 | Ht 67.0 in | Wt 131.2 lb

## 2015-04-22 DIAGNOSIS — I519 Heart disease, unspecified: Principal | ICD-10-CM

## 2015-04-22 DIAGNOSIS — M810 Age-related osteoporosis without current pathological fracture: Secondary | ICD-10-CM

## 2015-04-22 DIAGNOSIS — E871 Hypo-osmolality and hyponatremia: Secondary | ICD-10-CM

## 2015-04-22 DIAGNOSIS — E039 Hypothyroidism, unspecified: Secondary | ICD-10-CM | POA: Diagnosis not present

## 2015-04-22 DIAGNOSIS — E7800 Pure hypercholesterolemia, unspecified: Secondary | ICD-10-CM

## 2015-04-22 DIAGNOSIS — G609 Hereditary and idiopathic neuropathy, unspecified: Secondary | ICD-10-CM

## 2015-04-22 DIAGNOSIS — R443 Hallucinations, unspecified: Secondary | ICD-10-CM | POA: Diagnosis not present

## 2015-04-22 DIAGNOSIS — I10 Essential (primary) hypertension: Secondary | ICD-10-CM

## 2015-04-22 LAB — TSH: TSH: 1.22 u[IU]/mL (ref 0.35–4.50)

## 2015-04-22 MED ORDER — GABAPENTIN 300 MG PO CAPS: ORAL_CAPSULE | ORAL | Status: DC

## 2015-04-22 NOTE — Telephone Encounter (Signed)
Pt care taker would like to discuss pt with you prior to her appt today at 1045

## 2015-04-22 NOTE — Telephone Encounter (Signed)
I spoke with her Daughter in law when they got here

## 2015-04-22 NOTE — Progress Notes (Signed)
Patient ID: Joanna [name], female   DOB: [birth year], 82 y.o.   MRN: [id number]    Chief complaint: Leg pain  History of Present Illness:   1.  She was diagnosed to have idiopathic neuropathy several years ago and has been stable on gabapentin at night More recently she is complaining about pain in her legs including an walking also more sensation of numbness in her lower legs especially left.   2.  Weight loss: She had lost a significant amount of weight previously but appears to have gained back 10 pounds with improved appetite   Wt Readings from Last 3 Encounters:  04/22/15 131 lb 3.2 oz (59.512 kg)  03/15/15 131 lb (59.421 kg)  10/28/14 121 lb 3.2 oz (54.976 kg)     3.  Has history of hypothyroidism, mild in the past and TSH has been consistently normal with taking 62.5 mcg daily.  She is taking the brand name Synthroid and takes it regularly in the mornings SheDoes not complain of fatigue   Lab Results  Component Value Date   TSH 2.17 10/25/2014    4.  HALLUCINATIONS: Her daughter-in-law says that she has been hallucinating and talking to imaginary people mostly been looking at pictures of people on books and magazines. She thinks she may be depressed at times Has difficulty falling asleep frequently    5.  History of hyponatremia: She  tends to drink a lot of water.  Sodium is stable at 132 but is also taking Lozol   Lab Results  Component Value Date   CREATININE 0.91 04/19/2015   BUN 21 04/19/2015   NA 132* 04/19/2015   K 3.6 04/19/2015   CL 94* 04/19/2015   CO2 33* 04/19/2015          Medication List       This list is accurate as of: 04/22/15 12:02 PM.  Always use your most recent med list.               CALCIUM 600 PO  Take 1 tablet by mouth every morning.     cholecalciferol 1000 units tablet  Commonly known as:  VITAMIN D  Take 1,000 Units by mouth every morning.     gabapentin 300 MG capsule  Commonly known as:  NEURONTIN  Take 1 tablet  4 times a day     indapamide 1.25 MG tablet  Commonly known as:  LOZOL  Take 1 tablet (1.25 mg total) by mouth daily.     losartan 50 MG tablet  Commonly known as:  COZAAR  TK 1 T PO  QPM     metoprolol succinate 50 MG 24 hr tablet  Commonly known as:  TOPROL-XL  Take 1 tablet (50 mg total) by mouth daily. Take with or immediately following a meal.     mometasone-formoterol 200-5 MCG/ACT Aero  Commonly known as:  DULERA  Inhale 2 puffs into the lungs 2 (two) times daily.     nortriptyline 25 MG capsule  Commonly known as:  PAMELOR  Take 1 capsule by mouth at  bedtime     pantoprazole 40 MG tablet  Commonly known as:  PROTONIX  Take 40 mg by mouth daily before breakfast.     Potassium 75 MG Tabs  Take 1 tablet by mouth every morning.     PROAIR HFA 108 (90 Base) MCG/ACT inhaler  Generic drug:  albuterol  Inhale 2 puffs into the lungs every 4 (four) hours as needed for wheezing or  shortness of breath.     simvastatin 20 MG tablet  Commonly known as:  ZOCOR  Take 1 tablet by mouth  every evening     SYNTHROID 125 MCG tablet  Generic drug:  levothyroxine  Take one-half tablet by  mouth every morning     VISION FORMULA PO  Take 1 tablet by mouth every morning.     vitamin C 500 MG tablet  Commonly known as:  ASCORBIC ACID  Take 500 mg by mouth every morning.        Allergies:  Allergies  Allergen Reactions  . Cefuroxime Axetil Hives    Not sure.. Patient can be allergic to it today but a year from now she may not be.  . Lactose Intolerance (Gi) Other (See Comments)    Gi upset  . Clarithromycin Palpitations  . Codeine Palpitations  . Sulfonamide Derivatives Palpitations    Past Medical History  Diagnosis Date  . IBS (irritable bowel syndrome)   . Hypothyroid   . GERD (gastroesophageal reflux disease)   . Hypertension   . Allergic rhinitis   . Asthma   . Complication of anesthesia     confusion after 04/2012    Past Surgical History  Procedure  Laterality Date  . Mastectomy      double  . Bladder surgery    . Nasal sinus surgery    . Hip arthroplasty Left 05/04/2012    Procedure: ARTHROPLASTY BIPOLAR HIP;  Surgeon: Mauri Pole, MD;  Location: [facility];  Service: Orthopedics;  Laterality: Left;  . Closed reduction wrist fracture Left 05/04/2012    Procedure: CLOSED REDUCTION WRIST;  Surgeon: Mauri Pole, MD;  Location: [facility];  Service: Orthopedics;  Laterality: Left;  with casting  . Open reduction internal fixation (orif) distal radial fracture Left 05/29/2012    Procedure: OPEN REDUCTION INTERNAL FIXATION (ORIF) DISTAL RADIAL FRACTURE;  Surgeon: Linna Hoff, MD;  Location: [place];  Service: Orthopedics;  Laterality: Left;  . Foot surgery    . Abdominal hysterectomy      Family History  Problem Relation Age of Onset  . CAD Mother   . Hypertension Father   . Breast cancer Sister     Social History:  reports that she has never smoked. She has never used smokeless tobacco. She reports that she does not drink alcohol or use illicit drugs.  Review of Systems     History of osteoporosis/osteopenia. She had a bone density in 2014 which showed osteopenia with T score -2.3. She had refused to take Fosamax. She did get a Reclast infusion without any side effects in 01/2013 She is taking her calcium and vitamin D preparation as well as 1000 units of vitamin D 3. Vitamin D level is normal  Had  multiple fractures from fall in 2014 Bone density 12/14 showed Osteopenia. Lowest T score of -2.3 at the right femoral neck.  She had her last infusion of Reclast in 01/2013.   She will need to get this annually because of her history of multiple fractures but has difficulty getting scheduled for this  Has had COPD followed by pulmonologist   She has a history of hypercholesterolemia and has been taking her simvastatin   Lab Results  Component Value Date   CHOL 166 04/20/2015   HDL 66.60 04/20/2015   LDLCALC 87 04/20/2015    LDLDIRECT 93.8 11/05/2012   TRIG 64.0 04/20/2015   CHOLHDL 2 04/20/2015     THYROID nodule: She had  a biopsy which was benign   Exam:  BP 124/58 mmHg  Pulse 68  Temp(Src) 97.7 F (36.5 C)  Resp 14  Ht 5\' 7"  (1.702 m)  Wt 131 lb 3.2 oz (59.512 kg)  BMI 20.54 kg/m2  SpO2 94%   Mental status: She is alert and conversing normally and is able to describe her symptoms When asked her about whether she is living alone she said that she has some children coming on and off to stay with her even though this is not correct  No edema present No lymphadenopathy in the neck  Her foot exam showed the following: She has mild dusky cyanosis of the distal feet but no coldness or pallor Pedal pulses are normal Has markedly decreased monofilament sensation in the distal toes Biceps reflexes appear normal  Assessment/Plan:   Multiple problems as detailed in history of present illness and review of systems  Hallucinations: Appears to be getting atypical depression and will refer her to psychiatry Will also defer any management of insomnia to them  Weight loss history: This appears to have resolved  Mild hyponatremia: Asymptomatic, mild and stable.  This was present before starting Lozol   NEUROPATHY: This has been etiopathic She appears to have increased symptoms and has some sensory loss distally. Will increase her gabapentin to 4 times a day  HYPERTENSION: Since she is having good control of switching losartan to Lozol   HYPERCHOLESTEROLEMIA: LDL is controlled with resuming her simvastatin  HYPOTHYROID: TSH will be rechecked   [facility]

## 2015-04-22 NOTE — Patient Instructions (Signed)
Gabapentin with each meal and at bedtime

## 2015-04-26 ENCOUNTER — Telehealth: Payer: Self-pay | Admitting: Endocrinology

## 2015-04-26 ENCOUNTER — Other Ambulatory Visit: Payer: Self-pay | Admitting: *Deleted

## 2015-04-26 MED ORDER — METOPROLOL SUCCINATE ER 50 MG PO TB24: 50.0000 mg | ORAL_TABLET | Freq: Every day | ORAL | Status: DC

## 2015-04-26 MED ORDER — SIMVASTATIN 20 MG PO TABS: ORAL_TABLET | ORAL | Status: DC

## 2015-04-26 MED ORDER — INDAPAMIDE 1.25 MG PO TABS: 1.2500 mg | ORAL_TABLET | Freq: Every day | ORAL | Status: DC

## 2015-04-26 MED ORDER — NORTRIPTYLINE HCL 25 MG PO CAPS: ORAL_CAPSULE | ORAL | Status: DC

## 2015-04-26 MED ORDER — SYNTHROID 125 MCG PO TABS: ORAL_TABLET | ORAL | Status: DC

## 2015-04-26 NOTE — Telephone Encounter (Signed)
rxs sent

## 2015-04-26 NOTE — Telephone Encounter (Signed)
Pt needs [place] to call in refills on lozol, metoprolol, pamelor, simvastatin, synthyroid [facility] Metoprolol is completely out so please call in a bridge until she can get [facility]

## 2015-05-06 ENCOUNTER — Other Ambulatory Visit: Payer: Self-pay | Admitting: *Deleted

## 2015-05-06 ENCOUNTER — Telehealth: Payer: Self-pay | Admitting: Endocrinology

## 2015-05-06 MED ORDER — SIMVASTATIN 20 MG PO TABS: ORAL_TABLET | ORAL | Status: DC

## 2015-05-06 NOTE — Telephone Encounter (Signed)
A 15 day supply has been sent to [place] store

## 2015-05-06 NOTE — Telephone Encounter (Signed)
Patient need a partial refill of medication simvastatin, until it come through from mail order. she will be out before the weekend,  please call when ready [place], [place]. [phone] (Phone) [phone] (Fax)

## 2015-05-26 ENCOUNTER — Encounter: Payer: Self-pay | Admitting: Endocrinology

## 2015-05-26 ENCOUNTER — Ambulatory Visit (INDEPENDENT_AMBULATORY_CARE_PROVIDER_SITE_OTHER): Payer: Medicare Other | Admitting: Endocrinology

## 2015-05-26 VITALS — BP 118/78 | HR 66 | Temp 98.0°F | Resp 14 | Ht 67.0 in | Wt 134.4 lb

## 2015-05-26 DIAGNOSIS — I10 Essential (primary) hypertension: Secondary | ICD-10-CM

## 2015-05-26 DIAGNOSIS — E7800 Pure hypercholesterolemia, unspecified: Secondary | ICD-10-CM

## 2015-05-26 DIAGNOSIS — M810 Age-related osteoporosis without current pathological fracture: Secondary | ICD-10-CM | POA: Diagnosis not present

## 2015-05-26 DIAGNOSIS — G609 Hereditary and idiopathic neuropathy, unspecified: Secondary | ICD-10-CM

## 2015-05-26 NOTE — Patient Instructions (Addendum)
Take Mucinex 2x daily  Dulera inhaler take daily for 1 week

## 2015-05-26 NOTE — Progress Notes (Signed)
Patient ID: Joanna [name], female   DOB: [birth year], 82 y.o.   MRN: [id number]    Chief complaint: Follow-up of multiple problems  History of Present Illness:   1.  She was diagnosed to have idiopathic neuropathy several years ago and has been stable on gabapentin at night On her last visit she was complaining about her legs hurting more especially-year-old walking but also more numbness She was told to increase her gabapentin to up to 4 times a day but she is taking only 2 capsules daily She is not complaining of as much pain recently   2.  Weight loss: She had lost a significant amount of weight previously but appears to be gaining back this weight and her appetite is fairly good   Wt Readings from Last 3 Encounters:  05/26/15 134 lb 6.4 oz (60.963 kg)  04/22/15 131 lb 3.2 oz (59.512 kg)  03/15/15 131 lb (59.421 kg)     3.  Has history of hypothyroidism, mild in the past and TSH has been consistently normal with taking 62.5 mcg daily.  She is taking the brand name Synthroid and takes it regularly in the mornings Labs:  Lab Results  Component Value Date   TSH 1.22 04/22/2015    4.  HALLUCINATIONS: She was seen in April for this problem but not clear why appointment with psychiatrist was never made a window referral was done. Her son says that she is not having any hallucinations or abnormal thinking now   5.  History of hyponatremia: She  tends to drink a lot of water.   Sodium on the last labs was stable at 132 but is also taking Lozol   Lab Results  Component Value Date   CREATININE 0.91 04/19/2015   BUN 21 04/19/2015   NA 132* 04/19/2015   K 3.6 04/19/2015   CL 94* 04/19/2015   CO2 33* 04/19/2015   6.  COUGH.  She is complaining about having some cough and difficulty expectorating.  She thinks her sputum is stuck in her chest She is not having any purulent sputum and is not having any wheezing or fever She has not taken any OTC medications even though  her family has brought her some Mucinex       Medication List       This list is accurate as of: 05/26/15 11:59 PM.  Always use your most recent med list.               CALCIUM 600 PO  Take 1 tablet by mouth every morning.     cholecalciferol 1000 units tablet  Commonly known as:  VITAMIN D  Take 1,000 Units by mouth every morning.     gabapentin 300 MG capsule  Commonly known as:  NEURONTIN  Take 1 tablet 4 times a day     indapamide 1.25 MG tablet  Commonly known as:  LOZOL  Take 1 tablet (1.25 mg total) by mouth daily.     losartan 50 MG tablet  Commonly known as:  COZAAR  TK 1 T PO  QPM     metoprolol succinate 50 MG 24 hr tablet  Commonly known as:  TOPROL-XL  Take 1 tablet (50 mg total) by mouth daily. Take with or immediately following a meal.     mometasone-formoterol 200-5 MCG/ACT Aero  Commonly known as:  DULERA  Inhale 2 puffs into the lungs 2 (two) times daily.     nortriptyline 25 MG capsule  Commonly known as:  PAMELOR  Take 1 capsule by mouth at  bedtime     pantoprazole 40 MG tablet  Commonly known as:  PROTONIX  Take 40 mg by mouth daily before breakfast.     Potassium 75 MG Tabs  Take 1 tablet by mouth every morning.     PROAIR HFA 108 (90 Base) MCG/ACT inhaler  Generic drug:  albuterol  Inhale 2 puffs into the lungs every 4 (four) hours as needed for wheezing or shortness of breath.     simvastatin 20 MG tablet  Commonly known as:  ZOCOR  Take 1 tablet by mouth at bedtime     SYNTHROID 125 MCG tablet  Generic drug:  levothyroxine  Take one-half tablet by  mouth every morning     VISION FORMULA PO  Take 1 tablet by mouth every morning.     vitamin C 500 MG tablet  Commonly known as:  ASCORBIC ACID  Take 500 mg by mouth every morning.        Allergies:  Allergies  Allergen Reactions  . Cefuroxime Axetil Hives    Not sure.. Patient can be allergic to it today but a year from now she may not be.  . Lactose Intolerance (Gi)  Other (See Comments)    Gi upset  . Clarithromycin Palpitations  . Codeine Palpitations  . Sulfonamide Derivatives Palpitations    Past Medical History  Diagnosis Date  . IBS (irritable bowel syndrome)   . Hypothyroid   . GERD (gastroesophageal reflux disease)   . Hypertension   . Allergic rhinitis   . Asthma   . Complication of anesthesia     confusion after 04/2012    Past Surgical History  Procedure Laterality Date  . Mastectomy      double  . Bladder surgery    . Nasal sinus surgery    . Hip arthroplasty Left 05/04/2012    Procedure: ARTHROPLASTY BIPOLAR HIP;  Surgeon: Mauri Pole, MD;  Location: [facility];  Service: Orthopedics;  Laterality: Left;  . Closed reduction wrist fracture Left 05/04/2012    Procedure: CLOSED REDUCTION WRIST;  Surgeon: Mauri Pole, MD;  Location: [facility];  Service: Orthopedics;  Laterality: Left;  with casting  . Open reduction internal fixation (orif) distal radial fracture Left 05/29/2012    Procedure: OPEN REDUCTION INTERNAL FIXATION (ORIF) DISTAL RADIAL FRACTURE;  Surgeon: Linna Hoff, MD;  Location: [place];  Service: Orthopedics;  Laterality: Left;  . Foot surgery    . Abdominal hysterectomy      Family History  Problem Relation Age of Onset  . CAD Mother   . Hypertension Father   . Breast cancer Sister     Social History:  reports that she has never smoked. She has never used smokeless tobacco. She reports that she does not drink alcohol or use illicit drugs.  Review of Systems     History of osteoporosis/osteopenia. She had a bone density in 2014 which showed osteopenia with T score -2.3. She had refused to take Fosamax. She did get a Reclast infusion without any side effects in 01/2013 She is taking her calcium and vitamin D preparation as well as 1000 units of vitamin D 3. Vitamin D level is normal  Had  multiple fractures from fall in 2014 Bone density 12/14 showed Osteopenia. Lowest T score of -2.3 at the right femoral  neck.  She had her last infusion of Reclast in 01/2013.   She will need to  get this annually because of her history of multiple fractures but has not agreed to schedule this  Has had COPD followed by pulmonologist   She has a history of hypercholesterolemia and has been taking her simvastatin   Lab Results  Component Value Date   CHOL 166 04/20/2015   HDL 66.60 04/20/2015   LDLCALC 87 04/20/2015   LDLDIRECT 93.8 11/05/2012   TRIG 64.0 04/20/2015   CHOLHDL 2 04/20/2015     THYROID nodule: She had a biopsy on this which was benign   Exam:  BP 118/78 mmHg  Pulse 66  Temp(Src) 98 F (36.7 C)  Resp 14  Ht 5\' 7"  (1.702 m)  Wt 134 lb 6.4 oz (60.963 kg)  BMI 21.04 kg/m2  SpO2 97%   Mental status: She is alert and conversing normally   Lungs clear with only minimal expiratory wheezing anteriorly  Assessment/Plan:   Multiple problems as detailed in history of present illness and review of systems  Hallucinations: Resolved  Cough: She probably has chronic bronchitis and having more difficulty expectorating. No bronchospasm today She will try to take her Mucinex regularly and also her inhaler as prescribed by pulmonologist  NEUROPATHY: She is not having as many neuropathic symptoms and she can continue taking gabapentin as needed up to 4 times a day She is using a cane to walk and encourage her to continue doing this     [facility]

## 2015-06-23 ENCOUNTER — Other Ambulatory Visit: Payer: Self-pay | Admitting: Endocrinology

## 2015-07-26 ENCOUNTER — Other Ambulatory Visit (INDEPENDENT_AMBULATORY_CARE_PROVIDER_SITE_OTHER): Payer: Medicare Other

## 2015-07-26 DIAGNOSIS — M810 Age-related osteoporosis without current pathological fracture: Secondary | ICD-10-CM | POA: Diagnosis not present

## 2015-07-26 DIAGNOSIS — I10 Essential (primary) hypertension: Secondary | ICD-10-CM | POA: Diagnosis not present

## 2015-07-26 DIAGNOSIS — E039 Hypothyroidism, unspecified: Secondary | ICD-10-CM | POA: Diagnosis not present

## 2015-07-26 LAB — VITAMIN D 25 HYDROXY (VIT D DEFICIENCY, FRACTURES): VITD: 32.59 ng/mL (ref 30.00–100.00)

## 2015-07-26 LAB — BASIC METABOLIC PANEL
BUN: 25 mg/dL — AB (ref 6–23)
CALCIUM: 9.4 mg/dL (ref 8.4–10.5)
CO2: 29 mEq/L (ref 19–32)
Chloride: 96 mEq/L (ref 96–112)
Creatinine, Ser: 1.29 mg/dL — ABNORMAL HIGH (ref 0.40–1.20)
GFR: 41.76 mL/min — AB (ref >60.00)
Glucose, Bld: 147 mg/dL — ABNORMAL HIGH (ref 70–99)
POTASSIUM: 3.5 meq/L (ref 3.5–5.1)
Sodium: 132 mEq/L — ABNORMAL LOW (ref 135–145)

## 2015-07-26 LAB — TSH: TSH: 0.87 u[IU]/mL (ref 0.35–4.50)

## 2015-07-29 ENCOUNTER — Ambulatory Visit (INDEPENDENT_AMBULATORY_CARE_PROVIDER_SITE_OTHER): Payer: Medicare Other | Admitting: Endocrinology

## 2015-07-29 ENCOUNTER — Encounter: Payer: Self-pay | Admitting: Endocrinology

## 2015-07-29 VITALS — BP 128/72 | HR 82 | Wt 135.0 lb

## 2015-07-29 DIAGNOSIS — M810 Age-related osteoporosis without current pathological fracture: Secondary | ICD-10-CM

## 2015-07-29 DIAGNOSIS — E871 Hypo-osmolality and hyponatremia: Secondary | ICD-10-CM

## 2015-07-29 DIAGNOSIS — I10 Essential (primary) hypertension: Secondary | ICD-10-CM | POA: Diagnosis not present

## 2015-07-29 DIAGNOSIS — R413 Other amnesia: Secondary | ICD-10-CM | POA: Diagnosis not present

## 2015-07-29 MED ORDER — LOSARTAN POTASSIUM 25 MG PO TABS: 25.0000 mg | ORAL_TABLET | Freq: Every day | ORAL | Status: DC

## 2015-07-29 MED ORDER — DONEPEZIL HCL 5 MG PO TABS: 5.0000 mg | ORAL_TABLET | Freq: Every day | ORAL | Status: DC

## 2015-07-29 NOTE — Patient Instructions (Addendum)
Stop Indapamide  Thyroid pill in ams  1 a day for women

## 2015-07-29 NOTE — Progress Notes (Signed)
Patient ID: FARIA CULLEN, female   DOB: 11-24-1928, 82 y.o.   MRN: [id number]    Chief complaint: Follow-up of multiple problems  History of Present Illness:   1.  She was diagnosed to have idiopathic neuropathy several years ago and has been stable on gabapentin at night She is not complaining of as much pain recently and is taking gabapentin only about once a day   2.  Weight loss: She had lost a significant amount of weight previously but appears to be gaining back this weight  She is eating well  Wt Readings from Last 3 Encounters:  07/29/15 135 lb (61.236 kg)  05/26/15 134 lb 6.4 oz (60.963 kg)  04/22/15 131 lb 3.2 oz (59.512 kg)     3.  Has history of hypothyroidism, mild in the past and TSH has been consistently normal with taking 62.5 mcg daily.  She is taking the brand name Synthroid and takes it regularly in the Evening with her other medications at night otherwise she will forget  Labs:  Lab Results  Component Value Date   TSH 0.87 07/26/2015    4.  MEMORY difficulties: She has not expressed problems with this but appears today does not be able to recall recent activities.  She is depending on her family member to put her medications in a pillbox   5.  History of hyponatremia: No nausea    Sodium on the last labs was stable at 132 but is  taking Lozol   Lab Results  Component Value Date   CREATININE 1.29* 07/26/2015   BUN 25* 07/26/2015   NA 132* 07/26/2015   K 3.5 07/26/2015   CL 96 07/26/2015   CO2 29 07/26/2015      6.  HYPERTENSION: She is taking indapamide and metoprolol Not clear why she is not taking her losartan as prescribed On the blood pressure was initially point normal was higher on my measurement, she tends to get anxious Her daughter think that she occasionally gets lightheaded      Medication List       This list is accurate as of: 07/29/15 12:57 PM.  Always use your most recent med list.               CALCIUM 600  PO  Take 1 tablet by mouth every morning.     cholecalciferol 1000 units tablet  Commonly known as:  VITAMIN D  Take 1,000 Units by mouth every morning.     donepezil 5 MG tablet  Commonly known as:  ARICEPT  Take 1 tablet (5 mg total) by mouth at bedtime.     gabapentin 300 MG capsule  Commonly known as:  NEURONTIN  Take 1 tablet 4 times a day     indapamide 1.25 MG tablet  Commonly known as:  LOZOL  Take 1 tablet (1.25 mg total) by mouth daily.     losartan 25 MG tablet  Commonly known as:  COZAAR  Take 1 tablet (25 mg total) by mouth daily.     metoprolol succinate 50 MG 24 hr tablet  Commonly known as:  TOPROL-XL  Take 1 tablet by mouth  daily with or immediatley  following a meal     mometasone-formoterol 200-5 MCG/ACT Aero  Commonly known as:  DULERA  Inhale 2 puffs into the lungs 2 (two) times daily.     nortriptyline 25 MG capsule  Commonly known as:  PAMELOR  Take 1 capsule by mouth at  bedtime     pantoprazole 40 MG tablet  Commonly known as:  PROTONIX  Take 40 mg by mouth daily before breakfast. Reported on 07/29/2015     PROAIR HFA 108 (90 Base) MCG/ACT inhaler  Generic drug:  albuterol  Inhale 2 puffs into the lungs every 4 (four) hours as needed for wheezing or shortness of breath. Reported on 07/29/2015     simvastatin 20 MG tablet  Commonly known as:  ZOCOR  Take 1 tablet by mouth at bedtime     SYNTHROID 125 MCG tablet  Generic drug:  levothyroxine  Take one-half tablet by  mouth every morning     VISION FORMULA PO  Take 1 tablet by mouth every morning.     vitamin C 500 MG tablet  Commonly known as:  ASCORBIC ACID  Take 500 mg by mouth every morning. Reported on 07/29/2015        Allergies:  Allergies  Allergen Reactions  . Cefuroxime Axetil Hives    Not sure.. Patient can be allergic to it today but a year from now she may not be.  . Lactose Intolerance (Gi) Other (See Comments)    Gi upset  . Clarithromycin Palpitations  . Codeine  Palpitations  . Sulfonamide Derivatives Palpitations    Past Medical History  Diagnosis Date  . IBS (irritable bowel syndrome)   . Hypothyroid   . GERD (gastroesophageal reflux disease)   . Hypertension   . Allergic rhinitis   . Asthma   . Complication of anesthesia     confusion after 04/2012    Past Surgical History  Procedure Laterality Date  . Mastectomy      double  . Bladder surgery    . Nasal sinus surgery    . Hip arthroplasty Left 05/04/2012    Procedure: ARTHROPLASTY BIPOLAR HIP;  Surgeon: Mauri Pole, MD;  Location: [facility];  Service: Orthopedics;  Laterality: Left;  . Closed reduction wrist fracture Left 05/04/2012    Procedure: CLOSED REDUCTION WRIST;  Surgeon: Mauri Pole, MD;  Location: [facility];  Service: Orthopedics;  Laterality: Left;  with casting  . Open reduction internal fixation (orif) distal radial fracture Left 05/29/2012    Procedure: OPEN REDUCTION INTERNAL FIXATION (ORIF) DISTAL RADIAL FRACTURE;  Surgeon: Linna Hoff, MD;  Location: [place];  Service: Orthopedics;  Laterality: Left;  . Foot surgery    . Abdominal hysterectomy      Family History  Problem Relation Age of Onset  . CAD Mother   . Hypertension Father   . Breast cancer Sister     Social History:  reports that she has never smoked. She has never used smokeless tobacco. She reports that she does not drink alcohol or use illicit drugs.  Review of Systems     History of osteoporosis/osteopenia. She had a bone density in 2014 which showed osteopenia with T score -2.3. She had refused to take Fosamax. She did get a Reclast infusion without any side effects in 01/2013 She is taking her calcium and vitamin D preparation as well as 1000 units of vitamin D 3. Vitamin D level is normal  Had  multiple fractures from fall in 2014 Bone density 12/14 showed Osteopenia. Lowest T score of -2.3 at the right femoral neck.  She had her last infusion of Reclast in 01/2013.   Difficult to get her  scheduled for this  Has had COPD followed by pulmonologist   She has a history of hypercholesterolemia and has  been taking her simvastatin   Lab Results  Component Value Date   CHOL 166 04/20/2015   HDL 66.60 04/20/2015   LDLCALC 87 04/20/2015   LDLDIRECT 93.8 11/05/2012   TRIG 64.0 04/20/2015   CHOLHDL 2 04/20/2015     THYROID nodule: She had a biopsy on this which was benign  Anxiety: She appears anxious today.  She continues to take nortriptyline which was previously started for headaches.   Exam:  BP 128/72 mmHg  Pulse 82  Wt 135 lb (61.236 kg)  SpO2 97%   Repeat standing blood pressure 148/64 She cannot clearly recall what she had for dinner last night Also not able to be specific in several answers  Assessment/Plan:   HYPERTENSION: Blood pressure is controlled although she tends to get white coat anxiety Since her sodium is relatively low and potassium low normal on indapamide will switch her back to losartan at 25 mg   Dementia: She appears to be having difficulty with memory and managing her medications Will empirically start her on Aricept 5 mg daily  NEUROPATHY: Symptoms are mild and well controlled  Hyponatremia: Stable but will improve with stopping indapamide   Charo Philipp

## 2015-08-05 ENCOUNTER — Telehealth: Payer: Self-pay | Admitting: Endocrinology

## 2015-08-05 MED ORDER — POTASSIUM CHLORIDE ER 10 MEQ PO TBCR: 10.0000 meq | EXTENDED_RELEASE_TABLET | Freq: Every day | ORAL | Status: DC

## 2015-08-05 MED ORDER — FUROSEMIDE 20 MG PO TABS: 20.0000 mg | ORAL_TABLET | Freq: Every day | ORAL | Status: DC

## 2015-08-05 NOTE — Telephone Encounter (Signed)
Patient returned your call, call [phone]

## 2015-08-05 NOTE — Telephone Encounter (Signed)
Rx sent through e-scribe Left message on voicemail  

## 2015-08-05 NOTE — Telephone Encounter (Signed)
Patient is still having trouble with her feet swelling really bad having trouble walking, please advise on what to do.  Call Kiel [phone]

## 2015-08-05 NOTE — Telephone Encounter (Signed)
Lasix 20 mg as needed once a day ankle swelling.  Will also need to take potassium chloride 10 mEq 1 daily with the Lasix

## 2015-08-08 ENCOUNTER — Telehealth: Payer: Self-pay | Admitting: Endocrinology

## 2015-08-08 ENCOUNTER — Telehealth: Payer: Self-pay

## 2015-08-08 NOTE — Telephone Encounter (Signed)
She is supposed to start Lasix for swelling.  Previous blood pressure pill had caused low sodium

## 2015-08-08 NOTE — Telephone Encounter (Signed)
Nurse rep that cares for patient states her feet and ankles are swelling. She sated you changed blood pressure medication last week at visit and asked if that could cause the swelling; asking if we need to change her back to her old medications. Patient did increase gabapentin over the weekend, but no changed noted. Please advise. Thank you!

## 2015-08-08 NOTE — Telephone Encounter (Signed)
PT contact requesting a call back at work regarding a message from last week.

## 2015-08-09 ENCOUNTER — Other Ambulatory Visit: Payer: Self-pay

## 2015-08-09 NOTE — Telephone Encounter (Signed)
Called Joanna [name] back on patient medication issues. Advised her that patient was needing to be on lasix; she stated she had to pick it up from pharmacy and patient would begin taking that. I advised her to call back if any issues or other questions or concerns.

## 2015-08-22 ENCOUNTER — Other Ambulatory Visit: Payer: Medicare Other

## 2015-08-25 ENCOUNTER — Ambulatory Visit: Payer: Medicare Other | Admitting: Endocrinology

## 2015-08-27 ENCOUNTER — Other Ambulatory Visit: Payer: Self-pay | Admitting: Endocrinology

## 2015-09-14 ENCOUNTER — Telehealth: Payer: Self-pay | Admitting: Endocrinology

## 2015-09-14 ENCOUNTER — Other Ambulatory Visit: Payer: Self-pay

## 2015-09-14 MED ORDER — TRAMADOL HCL 50 MG PO TABS: 50.0000 mg | ORAL_TABLET | Freq: Three times a day (TID) | ORAL | 0 refills | Status: DC | PRN

## 2015-09-14 NOTE — Telephone Encounter (Signed)
wray hibma daughter in law stated patient is having bad pain in her legs, and like to know what to take, please call [phone]

## 2015-09-14 NOTE — Telephone Encounter (Signed)
Called and left vm asking for a return call

## 2015-09-14 NOTE — Telephone Encounter (Signed)
Please confirm that she is taking gabapentin 300 mg, 1-2 capsules as needed for times a day.  If not benefiting we can send a prescription for tramadol 50 mg twice a day as needed, 30 tablets

## 2015-09-15 NOTE — Telephone Encounter (Signed)
Patient is calling on the status of refill stated the pharmacy haven't received it.

## 2015-09-15 NOTE — Telephone Encounter (Signed)
Please find out if tramadol was faxed

## 2015-09-15 NOTE — Telephone Encounter (Addendum)
Prescription faxed at 3 pm today and patient notified.

## 2015-09-18 IMAGING — CR DG LUMBAR SPINE COMPLETE 4+V
5 series · 5 of 5 positions shown · non-contrast
Comparison: KUB of 11/13/2011

CLINICAL DATA: Low back pain and bilateral leg pain, no trauma

EXAM:
LUMBAR SPINE - COMPLETE 4+ VIEW

[t l-spine a.p.]
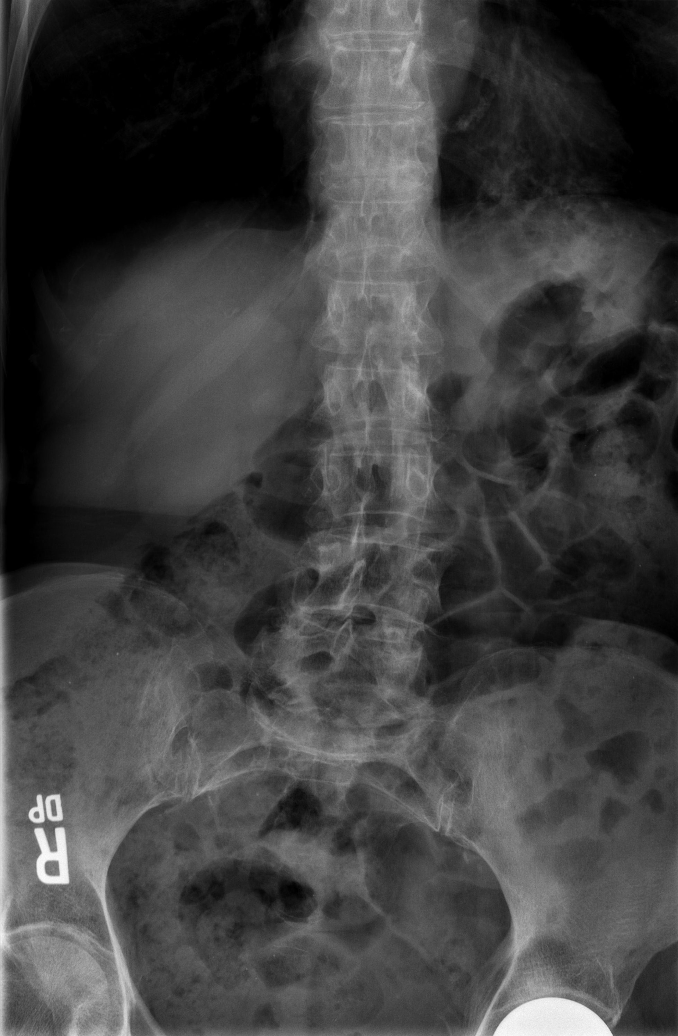

[t l-spine oblique exposure (1 of 2)]
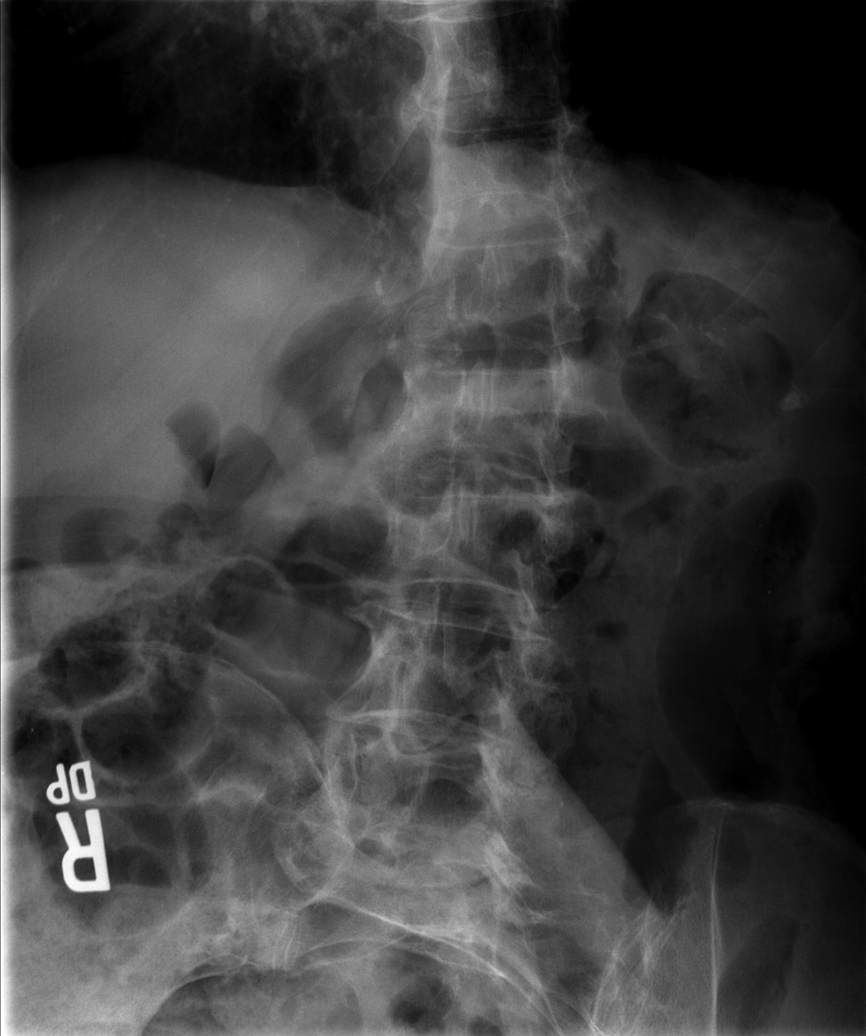

[t l-spine oblique exposure (2 of 2)]
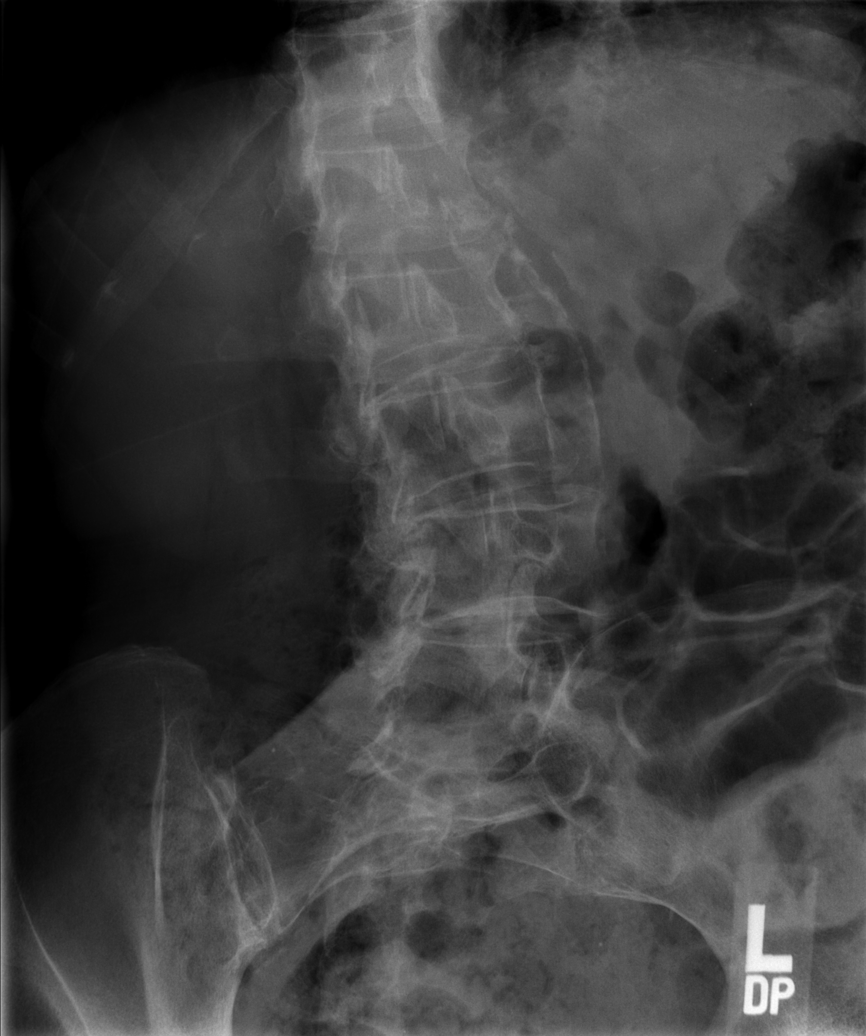

[t l-spine lat]
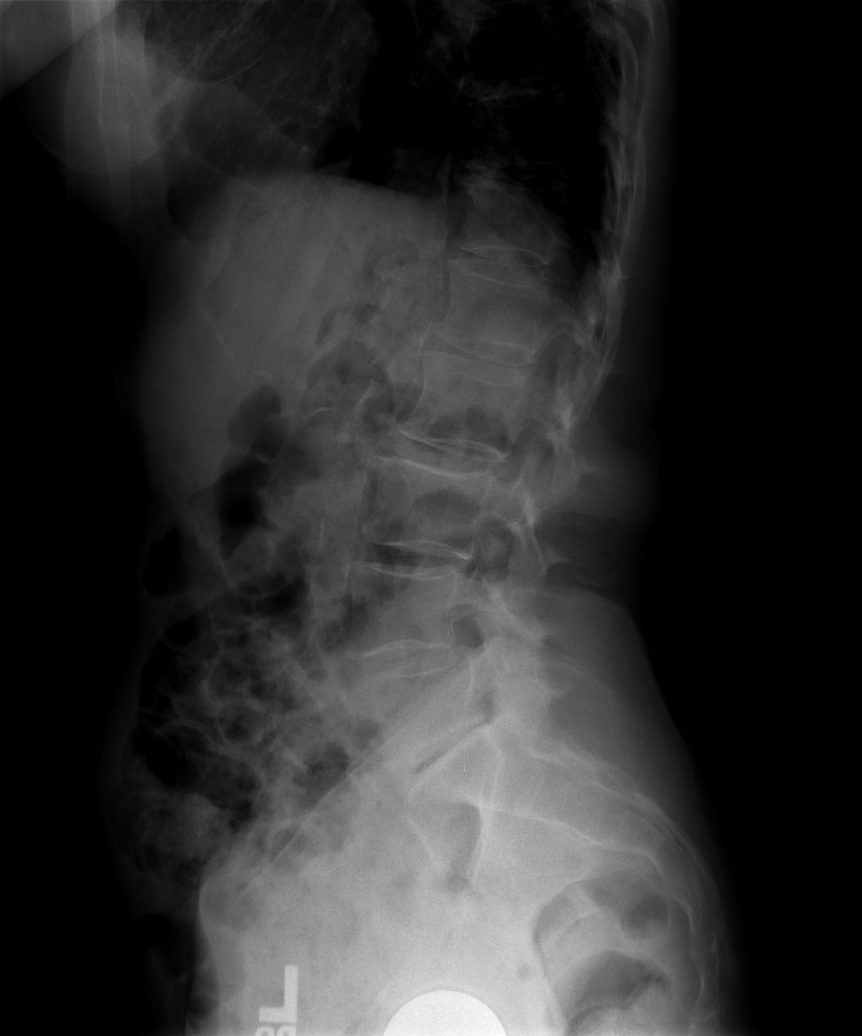

[t l-spine l5-s1 spot]
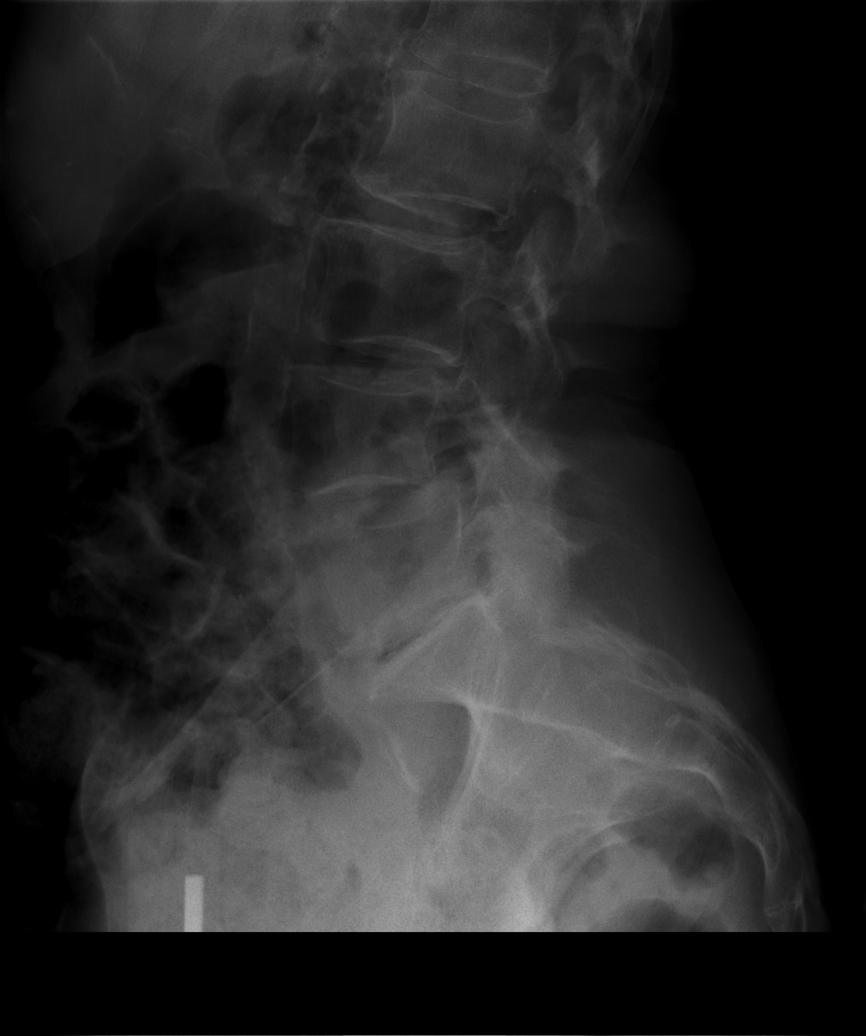

[5 of 5 positions shown; findings below may reference images not displayed]

FINDINGS: There is now a slight curvature of the lumbar spine convex to the
left by 12 degrees. The bones are somewhat osteopenic. In the
lateral view there is mild anterolisthesis of L4 on L5 by 5 mm and
mild retrolisthesis of L3 on L4 by 5 mm. These changes appear to be
due to degenerative change involving the facet joints of the lower
lumbar spine. No compression deformity is seen. There is
degenerative disc disease most marked at L5-S1.
IMPRESSION: 1. Degenerative disc disease at L5-S1.
2. Mild anterolisthesis of L4 on L5 and retrolisthesis of L3 on L4
by 5 mm. These changes most likely are degenerative in nature.
3. Mild curvature of the lumbar spine convex to the left by 12
degrees.

## 2015-09-20 ENCOUNTER — Other Ambulatory Visit: Payer: Self-pay | Admitting: Endocrinology

## 2015-09-23 ENCOUNTER — Other Ambulatory Visit: Payer: Medicare Other

## 2015-09-28 ENCOUNTER — Telehealth: Payer: Self-pay | Admitting: Endocrinology

## 2015-09-28 ENCOUNTER — Other Ambulatory Visit: Payer: Self-pay | Admitting: *Deleted

## 2015-09-28 MED ORDER — DONEPEZIL HCL 5 MG PO TABS: 5.0000 mg | ORAL_TABLET | Freq: Every day | ORAL | 3 refills | Status: DC

## 2015-09-28 MED ORDER — LOSARTAN POTASSIUM 25 MG PO TABS: 25.0000 mg | ORAL_TABLET | Freq: Every day | ORAL | 3 refills | Status: DC

## 2015-09-28 NOTE — Telephone Encounter (Signed)
Rxs sent

## 2015-09-28 NOTE — Telephone Encounter (Signed)
PT needs her Losartan and Donepezil sent into [facility].

## 2015-09-29 ENCOUNTER — Ambulatory Visit: Payer: Medicare Other | Admitting: Endocrinology

## 2015-10-07 ENCOUNTER — Other Ambulatory Visit (INDEPENDENT_AMBULATORY_CARE_PROVIDER_SITE_OTHER): Payer: Medicare Other

## 2015-10-07 DIAGNOSIS — R413 Other amnesia: Secondary | ICD-10-CM

## 2015-10-07 DIAGNOSIS — I10 Essential (primary) hypertension: Secondary | ICD-10-CM | POA: Diagnosis not present

## 2015-10-07 DIAGNOSIS — E871 Hypo-osmolality and hyponatremia: Secondary | ICD-10-CM | POA: Diagnosis not present

## 2015-10-07 LAB — CBC
HEMATOCRIT: 38.1 % (ref 36.0–46.0)
HEMOGLOBIN: 12.9 g/dL (ref 12.0–15.0)
MCHC: 34 g/dL (ref 30.0–36.0)
MCV: 86.1 fl (ref 78.0–100.0)
Platelets: 313 10*3/uL (ref 150.0–400.0)
RBC: 4.43 Mil/uL (ref 3.87–5.11)
RDW: 13.9 % (ref 11.5–15.5)
WBC: 7.8 10*3/uL (ref 4.0–10.5)

## 2015-10-07 LAB — COMPREHENSIVE METABOLIC PANEL
ALBUMIN: 3.9 g/dL (ref 3.5–5.2)
ALK PHOS: 49 U/L (ref 39–117)
ALT: 14 U/L (ref 0–35)
AST: 25 U/L (ref 0–37)
BUN: 34 mg/dL — AB (ref 6–23)
CALCIUM: 9 mg/dL (ref 8.4–10.5)
CO2: 30 mEq/L (ref 19–32)
Chloride: 99 mEq/L (ref 96–112)
Creatinine, Ser: 1.18 mg/dL (ref 0.40–1.20)
GFR: 46.26 mL/min — AB (ref >60.00)
Glucose, Bld: 79 mg/dL (ref 70–99)
POTASSIUM: 4.1 meq/L (ref 3.5–5.1)
SODIUM: 136 meq/L (ref 135–145)
TOTAL PROTEIN: 7.2 g/dL (ref 6.0–8.3)
Total Bilirubin: 0.6 mg/dL (ref 0.2–1.2)

## 2015-10-07 LAB — VITAMIN B12: Vitamin B-12: 1282 pg/mL — ABNORMAL HIGH (ref 211–911)

## 2015-10-07 LAB — TSH: TSH: 2.32 u[IU]/mL (ref 0.35–4.50)

## 2015-10-11 ENCOUNTER — Telehealth: Payer: Self-pay | Admitting: Endocrinology

## 2015-10-11 ENCOUNTER — Other Ambulatory Visit: Payer: Self-pay | Admitting: *Deleted

## 2015-10-11 MED ORDER — POTASSIUM CHLORIDE ER 10 MEQ PO TBCR: 10.0000 meq | EXTENDED_RELEASE_TABLET | Freq: Every day | ORAL | 3 refills | Status: DC

## 2015-10-11 MED ORDER — FUROSEMIDE 20 MG PO TABS: 20.0000 mg | ORAL_TABLET | Freq: Every day | ORAL | 3 refills | Status: DC

## 2015-10-11 NOTE — Telephone Encounter (Signed)
Rx sent 

## 2015-10-11 NOTE — Telephone Encounter (Signed)
Daughter in law expresses that the patient needs the following prescriptions refilled. 1) Furosemide  2) Potassium CLER    Please send prescriptions to [place].

## 2015-10-12 ENCOUNTER — Ambulatory Visit: Payer: Medicare Other | Admitting: Endocrinology

## 2015-11-07 ENCOUNTER — Encounter: Payer: Self-pay | Admitting: Endocrinology

## 2015-11-07 ENCOUNTER — Ambulatory Visit (INDEPENDENT_AMBULATORY_CARE_PROVIDER_SITE_OTHER): Payer: Medicare Other | Admitting: Endocrinology

## 2015-11-07 ENCOUNTER — Other Ambulatory Visit: Payer: Self-pay | Admitting: *Deleted

## 2015-11-07 VITALS — BP 128/60 | HR 65 | Temp 98.0°F | Resp 16 | Ht 67.0 in | Wt 131.2 lb

## 2015-11-07 DIAGNOSIS — R413 Other amnesia: Secondary | ICD-10-CM | POA: Diagnosis not present

## 2015-11-07 DIAGNOSIS — E7800 Pure hypercholesterolemia, unspecified: Secondary | ICD-10-CM

## 2015-11-07 DIAGNOSIS — R0781 Pleurodynia: Secondary | ICD-10-CM | POA: Diagnosis not present

## 2015-11-07 DIAGNOSIS — I10 Essential (primary) hypertension: Secondary | ICD-10-CM

## 2015-11-07 DIAGNOSIS — R600 Localized edema: Secondary | ICD-10-CM | POA: Diagnosis not present

## 2015-11-07 MED ORDER — POTASSIUM CHLORIDE ER 10 MEQ PO TBCR: EXTENDED_RELEASE_TABLET | ORAL | 1 refills | Status: DC

## 2015-11-07 MED ORDER — LOSARTAN POTASSIUM 25 MG PO TABS: 25.0000 mg | ORAL_TABLET | Freq: Every day | ORAL | 1 refills | Status: DC

## 2015-11-07 MED ORDER — FUROSEMIDE 20 MG PO TABS: 20.0000 mg | ORAL_TABLET | ORAL | 1 refills | Status: DC

## 2015-11-07 MED ORDER — CYCLOBENZAPRINE HCL 5 MG PO TABS: 5.0000 mg | ORAL_TABLET | Freq: Three times a day (TID) | ORAL | 0 refills | Status: DC | PRN

## 2015-11-07 MED ORDER — DONEPEZIL HCL 5 MG PO TABS: 5.0000 mg | ORAL_TABLET | Freq: Every day | ORAL | 1 refills | Status: DC

## 2015-11-07 NOTE — Progress Notes (Signed)
Patient ID: Joanna [name], female   DOB: 16-Jul-1928, 82 y.o.   MRN: [id number]    Chief complaint: Follow-up of multiple problems  History of Present Illness:   1.  She is complaining of persistent pain on the left side of chest and back.  She does not know how long ago this started but was called in tramadol for this.  Pain is fairly persistent and not relieved by tramadol.  She is not finding any particular moment to cause the pain but her son-in-law says that sometimes when she is making the bed she has to stop doing this.  She has not had any fall or trauma from this.   2.  Weight loss: She has had some fluctuation in her weight and is down slightly again She thinks she is having normal appetite and no nausea  Wt Readings from Last 3 Encounters:  11/07/15 131 lb 3.2 oz (59.5 kg)  07/29/15 135 lb (61.2 kg)  05/26/15 134 lb 6.4 oz (61 kg)     3.  Has history of hypothyroidism, mild in the past and TSH has been consistently normal with taking 62.5 mcg daily.  She is taking the brand name Synthroid and takes it regularly in the evening with her other medications for better compliance  TSH has been consistent  Labs:  Lab Results  Component Value Date   TSH 2.32 10/07/2015    4.  MEMORY difficulties:   This appears to be somewhat better with starting Aricept She thinks she can from her things better and her son-in-law confirms this However she still dependent on her family to help her with medications    5.  History of hyponatremia:  She was supposed to stop indapamide and not clear if she has done so but her sodium is improved  No nausea  or change in appetite  Lab Results  Component Value Date   CREATININE 1.18 10/07/2015   BUN 34 (H) 10/07/2015   NA 136 10/07/2015   K 4.1 10/07/2015   CL 99 10/07/2015   CO2 30 10/07/2015      6.  HYPERTENSION: She is taking losartan and metoprolol. She may not have been taking losartan on her last visit and was told  to stop indapamide because of a penetrating   7.  She was diagnosed to have idiopathic neuropathy several years ago and has been stable on gabapentin at night She is not complaining of as much pain recently and is taking gabapentin only about once a day      Medication List       Accurate as of 11/07/15  2:54 PM. Always use your most recent med list.          CALCIUM 600 PO Take 1 tablet by mouth every morning.   cholecalciferol 1000 units tablet Commonly known as:  VITAMIN D Take 1,000 Units by mouth every morning.   donepezil 5 MG tablet Commonly known as:  ARICEPT Take 1 tablet (5 mg total) by mouth at bedtime.   furosemide 20 MG tablet Commonly known as:  LASIX Take 1 tablet (20 mg total) by mouth daily.   gabapentin 300 MG capsule Commonly known as:  NEURONTIN Take 1 tablet 4 times a day   indapamide 1.25 MG tablet Commonly known as:  LOZOL Take 1 tablet (1.25 mg total) by mouth daily.   losartan 25 MG tablet Commonly known as:  COZAAR Take 1 tablet (25 mg total) by mouth daily.  metoprolol succinate 50 MG 24 hr tablet Commonly known as:  TOPROL-XL Take 1 tablet by mouth  daily with or immediatley  following a meal   mometasone-formoterol 200-5 MCG/ACT Aero Commonly known as:  DULERA Inhale 2 puffs into the lungs 2 (two) times daily.   nortriptyline 25 MG capsule Commonly known as:  PAMELOR Take 1 capsule by mouth at  bedtime   pantoprazole 40 MG tablet Commonly known as:  PROTONIX Take 40 mg by mouth daily before breakfast. Reported on 07/29/2015   penicillin v potassium 500 MG tablet Commonly known as:  VEETID   potassium chloride 10 MEQ tablet Commonly known as:  K-DUR Take 1 tablet (10 mEq total) by mouth daily.   PROAIR HFA 108 (90 Base) MCG/ACT inhaler Generic drug:  albuterol Inhale 2 puffs into the lungs every 4 (four) hours as needed for wheezing or shortness of breath. Reported on 07/29/2015   simvastatin 20 MG tablet Commonly  known as:  ZOCOR Take 1 tablet by mouth at bedtime   SYNTHROID 125 MCG tablet Generic drug:  levothyroxine Take one-half tablet by  mouth every morning   traMADol 50 MG tablet Commonly known as:  ULTRAM Take 1 tablet (50 mg total) by mouth every 8 (eight) hours as needed.   VISION FORMULA PO Take 1 tablet by mouth every morning.   vitamin C 500 MG tablet Commonly known as:  ASCORBIC ACID Take 500 mg by mouth every morning. Reported on 07/29/2015       Allergies:  Allergies  Allergen Reactions  . Cefuroxime Axetil Hives    Not sure.. Patient can be allergic to it today but a year from now she may not be.  . Lactose Intolerance (Gi) Other (See Comments)    Gi upset  . Clarithromycin Palpitations  . Codeine Palpitations  . Sulfonamide Derivatives Palpitations    Past Medical History:  Diagnosis Date  . Allergic rhinitis   . Asthma   . Complication of anesthesia    confusion after 04/2012  . GERD (gastroesophageal reflux disease)   . Hypertension   . Hypothyroid   . IBS (irritable bowel syndrome)     Past Surgical History:  Procedure Laterality Date  . ABDOMINAL HYSTERECTOMY    . BLADDER SURGERY    . CLOSED REDUCTION WRIST FRACTURE Left 05/04/2012   Procedure: CLOSED REDUCTION WRIST;  Surgeon: Mauri Pole, MD;  Location: [facility];  Service: Orthopedics;  Laterality: Left;  with casting  . FOOT SURGERY    . HIP ARTHROPLASTY Left 05/04/2012   Procedure: ARTHROPLASTY BIPOLAR HIP;  Surgeon: Mauri Pole, MD;  Location: [facility];  Service: Orthopedics;  Laterality: Left;  Marland Kitchen MASTECTOMY     double  . NASAL SINUS SURGERY    . OPEN REDUCTION INTERNAL FIXATION (ORIF) DISTAL RADIAL FRACTURE Left 05/29/2012   Procedure: OPEN REDUCTION INTERNAL FIXATION (ORIF) DISTAL RADIAL FRACTURE;  Surgeon: Linna Hoff, MD;  Location: [place];  Service: Orthopedics;  Laterality: Left;    Family History  Problem Relation Age of Onset  . CAD Mother   . Hypertension Father   . Breast  cancer Sister     Social History:  reports that she has never smoked. She has never used smokeless tobacco. She reports that she does not drink alcohol or use drugs.  Review of Systems     History of osteoporosis/osteopenia. She had a bone density in 2014 which showed osteopenia with T score -2.3. She had refused to take Fosamax. She  did get a Reclast infusion without any side effects in 01/2013 She is taking her calcium and vitamin D preparation as well as 1000 units of vitamin D 3. Vitamin D level is normal  Had  multiple fractures from fall in 2014 Bone density 12/14 showed Osteopenia. Lowest T score of -2.3 at the right femoral neck.  She had her last infusion of Reclast in 01/2013.   She does not keep appointments that are being made or refuses to make an appointment when called  Has had COPD followed by pulmonologist   She has a history of hypercholesterolemia and has been taking her simvastatin   Lab Results  Component Value Date   CHOL 166 04/20/2015   HDL 66.60 04/20/2015   LDLCALC 87 04/20/2015   LDLDIRECT 93.8 11/05/2012   TRIG 64.0 04/20/2015   CHOLHDL 2 04/20/2015     THYROID nodule: She had a biopsy on this which was benign  EDEMA: She had previously called to report leg edema and Lasix was started along with potassium  Exam:  BP 128/60   Pulse 65   Temp 98 F (36.7 C)   Resp 16   Ht 5\' 7"  (1.702 m)   Wt 131 lb 3.2 oz (59.5 kg)   SpO2 96%   BMI 20.55 kg/m    She is alert and answering questions better today although is unaware of her medications   No significant tenderness on  palpation of the left rib area laterally and inferiorly No spine or pelvic crest tenderness Has paravertebral muscular spasm on the left side No flank tenderness or abdominal tenderness on exam Lungs clear No pedal edema  Assessment/Plan:    HYPERTENSION: Blood pressure is controlled   she will continue her losartan and metoprolol Her son-in-law was told to make sure  she is not taking indapamide  Left-sided musculoskeletal pain: She has been centered around her lower lateral ribs On exam has muscle spasm and not clear if she has an underlying healing fracture We will need to have an x-ray done Empirically will give her Flexeril for muscle spasm, her insurance does not cover Robaxin or Skelaxin Consider sports medicine consultation if not improved Also discussed that if she has evidence of fracture will consider Reclast again Advised her how this would be done in the office  EDEMA: This is controlled and she can reduce Lasix to every other day  Dementia: She appears to be having less difficulty with memory with starting Aricept and will continue  NEUROPATHY: Symptoms are mild and well controlled  Hyponatremia: Improved  Hypothyroidism: Stable and adequately replaced  Total visit time to evaluate and manage multiple problems = 25 minutes   Zacory Fiola

## 2015-11-07 NOTE — Patient Instructions (Addendum)
Take Furosemide and potassium every 2 days  Stop Indapamide if taking  Muscle relaxant take at bedtime, may cause drowsiness

## 2015-12-03 ENCOUNTER — Other Ambulatory Visit: Payer: Self-pay | Admitting: Endocrinology

## 2016-01-16 ENCOUNTER — Other Ambulatory Visit: Payer: Self-pay | Admitting: Endocrinology

## 2016-01-22 ENCOUNTER — Other Ambulatory Visit: Payer: Self-pay | Admitting: Endocrinology

## 2016-02-02 ENCOUNTER — Other Ambulatory Visit: Payer: Medicare Other

## 2016-02-03 ENCOUNTER — Other Ambulatory Visit (INDEPENDENT_AMBULATORY_CARE_PROVIDER_SITE_OTHER): Payer: Medicare Other

## 2016-02-03 DIAGNOSIS — R413 Other amnesia: Secondary | ICD-10-CM

## 2016-02-03 DIAGNOSIS — E7800 Pure hypercholesterolemia, unspecified: Secondary | ICD-10-CM | POA: Diagnosis not present

## 2016-02-03 DIAGNOSIS — R0781 Pleurodynia: Secondary | ICD-10-CM | POA: Diagnosis not present

## 2016-02-03 DIAGNOSIS — I10 Essential (primary) hypertension: Secondary | ICD-10-CM | POA: Diagnosis not present

## 2016-02-03 DIAGNOSIS — R0789 Other chest pain: Secondary | ICD-10-CM

## 2016-02-03 LAB — COMPREHENSIVE METABOLIC PANEL
ALT: 10 U/L (ref 0–35)
AST: 20 U/L (ref 0–37)
Albumin: 3.8 g/dL (ref 3.5–5.2)
Alkaline Phosphatase: 52 U/L (ref 39–117)
BILIRUBIN TOTAL: 0.6 mg/dL (ref 0.2–1.2)
BUN: 17 mg/dL (ref 6–23)
CALCIUM: 9.5 mg/dL (ref 8.4–10.5)
CHLORIDE: 101 meq/L (ref 96–112)
CO2: 30 meq/L (ref 19–32)
CREATININE: 0.93 mg/dL (ref 0.40–1.20)
GFR: 60.84 mL/min (ref >60.00)
GLUCOSE: 114 mg/dL — AB (ref 70–99)
Potassium: 3.8 mEq/L (ref 3.5–5.1)
SODIUM: 139 meq/L (ref 135–145)
Total Protein: 6.9 g/dL (ref 6.0–8.3)

## 2016-02-03 LAB — CBC
HCT: 37.7 % (ref 36.0–46.0)
Hemoglobin: 12.7 g/dL (ref 12.0–15.0)
MCHC: 33.7 g/dL (ref 30.0–36.0)
MCV: 84.8 fl (ref 78.0–100.0)
Platelets: 311 10*3/uL (ref 150.0–400.0)
RBC: 4.44 Mil/uL (ref 3.87–5.11)
RDW: 14.6 % (ref 11.5–15.5)
WBC: 5.9 10*3/uL (ref 4.0–10.5)

## 2016-02-03 LAB — LIPID PANEL
CHOL/HDL RATIO: 3
Cholesterol: 188 mg/dL (ref 0–200)
HDL: 60.2 mg/dL (ref >39.00)
LDL CALC: 111 mg/dL — AB (ref 0–99)
NONHDL: 127.91
TRIGLYCERIDES: 85 mg/dL (ref 0.0–149.0)
VLDL: 17 mg/dL (ref 0.0–40.0)

## 2016-02-07 ENCOUNTER — Encounter: Payer: Self-pay | Admitting: Endocrinology

## 2016-02-07 ENCOUNTER — Ambulatory Visit (INDEPENDENT_AMBULATORY_CARE_PROVIDER_SITE_OTHER): Payer: Medicare Other | Admitting: Endocrinology

## 2016-02-07 VITALS — BP 138/70 | HR 76 | Ht 67.0 in | Wt 126.0 lb

## 2016-02-07 DIAGNOSIS — R42 Dizziness and giddiness: Secondary | ICD-10-CM | POA: Diagnosis not present

## 2016-02-07 DIAGNOSIS — Z23 Encounter for immunization: Secondary | ICD-10-CM | POA: Diagnosis not present

## 2016-02-07 DIAGNOSIS — R634 Abnormal weight loss: Secondary | ICD-10-CM | POA: Diagnosis not present

## 2016-02-07 DIAGNOSIS — E7800 Pure hypercholesterolemia, unspecified: Secondary | ICD-10-CM | POA: Diagnosis not present

## 2016-02-07 DIAGNOSIS — R413 Other amnesia: Secondary | ICD-10-CM | POA: Diagnosis not present

## 2016-02-07 DIAGNOSIS — K5900 Constipation, unspecified: Secondary | ICD-10-CM

## 2016-02-07 NOTE — Patient Instructions (Signed)
Miralax 1 scoop daily   Start Ensure daily

## 2016-02-07 NOTE — Addendum Note (Signed)
Addended by: Nile Riggs on: 02/07/2016 02:38 PM   Modules accepted: Orders

## 2016-02-07 NOTE — Progress Notes (Signed)
Patient ID: Joanna [name], female   DOB: [birth year], 83 y.o.   MRN: [id number]    Chief complaint: multiple problems  History of Present Illness:   1.  She is complaining of constipation. She insists that she is not able to empty her bowels and is stating that there is no way out for her bowels.  She also things that she cannot eat because she is not able to empty her bowels.  On questioning she says that she is having bowel movements about every other day and recently has not had one for about 3 days. She has taken OTC laxatives such as milk of magnesia and Ex-Lax She is not complaining of any nausea, abdominal pain but is eating less and losing weight .   2.  Weight loss: She has had some fluctuation in her weight and appears to have significant weight loss on this visit. She has not had any nutritional supplements  Wt Readings from Last 3 Encounters:  02/07/16 126 lb (57.2 kg)  11/07/15 131 lb 3.2 oz (59.5 kg)  07/29/15 135 lb (61.2 kg)     3.  Has history of hypothyroidism, mild in the past and TSH has been consistently normal with taking 62.5 mcg daily.  She is taking the brand name Synthroid and takes it regularly in the evening with her other medications for better compliance Does not complain of unusual fatigue  Labs:  Lab Results  Component Value Date   TSH 2.32 10/07/2015    4.  MEMORY Problems:   She appears to have some recall of recent events but appears to be fixated on her problems with her bowels Also she still dependent on her family to help her with medications Continues to take Aricept   5.  History of hyponatremia:  Resolved  Lab Results  Component Value Date   CREATININE 0.93 02/03/2016   BUN 17 02/03/2016   NA 139 02/03/2016   K 3.8 02/03/2016   CL 101 02/03/2016   CO2 30 02/03/2016      6.  HYPERTENSION: She is taking losartan and metoprolol. No lightheadedness but that times a day feel a little swimmy headedness    7.  She  was diagnosed to have idiopathic neuropathy several years ago and has been stable on gabapentin at night She is not complaining of as much pain recently and is taking gabapentin only about once a day  8.  She says she can hear some type of movement or noise in her left ear   Allergies as of 02/07/2016      Reactions   Cefuroxime Axetil Hives   Not sure.. Patient can be allergic to it today but a year from now she may not be.   Lactose Intolerance (gi) Other (See Comments)   Gi upset   Clarithromycin Palpitations   Codeine Palpitations   Sulfonamide Derivatives Palpitations      Medication List       Accurate as of 02/07/16 10:54 AM. Always use your most recent med list.          CALCIUM 600 PO Take 1 tablet by mouth every morning.   cholecalciferol 1000 units tablet Commonly known as:  VITAMIN D Take 1,000 Units by mouth every morning.   cyclobenzaprine 5 MG tablet Commonly known as:  FLEXERIL Take 1 tablet (5 mg total) by mouth 3 (three) times daily as needed for muscle spasms. Start with 1 at bedtime   donepezil 5 MG tablet  Commonly known as:  ARICEPT Take 1 tablet (5 mg total) by mouth at bedtime.   furosemide 20 MG tablet Commonly known as:  LASIX Take 1 tablet (20 mg total) by mouth every other day.   gabapentin 300 MG capsule Commonly known as:  NEURONTIN TAKE 1 CAPSULE BY MOUTH 4  TIMES DAILY   losartan 25 MG tablet Commonly known as:  COZAAR Take 1 tablet (25 mg total) by mouth daily.   metoprolol succinate 50 MG 24 hr tablet Commonly known as:  TOPROL-XL TAKE 1 TABLET BY MOUTH  DAILY WITH OR IMMEDIATLEY  FOLLOWING A MEAL   mometasone-formoterol 200-5 MCG/ACT Aero Commonly known as:  DULERA Inhale 2 puffs into the lungs 2 (two) times daily.   nortriptyline 25 MG capsule Commonly known as:  PAMELOR TAKE 1 CAPSULE BY MOUTH AT  BEDTIME   pantoprazole 40 MG tablet Commonly known as:  PROTONIX Take 40 mg by mouth daily before breakfast. Reported on  07/29/2015   penicillin v potassium 500 MG tablet Commonly known as:  VEETID   potassium chloride 10 MEQ tablet Commonly known as:  K-DUR Take 1 tablet by mouth every other day   PROAIR HFA 108 (90 Base) MCG/ACT inhaler Generic drug:  albuterol Inhale 2 puffs into the lungs every 4 (four) hours as needed for wheezing or shortness of breath. Reported on 07/29/2015   simvastatin 20 MG tablet Commonly known as:  ZOCOR Take 1 tablet by mouth at bedtime   SYNTHROID 125 MCG tablet Generic drug:  levothyroxine TAKE ONE-HALF TABLET BY  MOUTH EVERY MORNING   traMADol 50 MG tablet Commonly known as:  ULTRAM Take 1 tablet (50 mg total) by mouth every 8 (eight) hours as needed.   VISION FORMULA PO Take 1 tablet by mouth every morning.   vitamin C 500 MG tablet Commonly known as:  ASCORBIC ACID Take 500 mg by mouth every morning. Reported on 07/29/2015       Allergies:  Allergies  Allergen Reactions  . Cefuroxime Axetil Hives    Not sure.. Patient can be allergic to it today but a year from now she may not be.  . Lactose Intolerance (Gi) Other (See Comments)    Gi upset  . Clarithromycin Palpitations  . Codeine Palpitations  . Sulfonamide Derivatives Palpitations    Past Medical History:  Diagnosis Date  . Allergic rhinitis   . Asthma   . Complication of anesthesia    confusion after 04/2012  . GERD (gastroesophageal reflux disease)   . Hypertension   . Hypothyroid   . IBS (irritable bowel syndrome)     Past Surgical History:  Procedure Laterality Date  . ABDOMINAL HYSTERECTOMY    . BLADDER SURGERY    . CLOSED REDUCTION WRIST FRACTURE Left 05/04/2012   Procedure: CLOSED REDUCTION WRIST;  Surgeon: Mauri Pole, MD;  Location: [facility];  Service: Orthopedics;  Laterality: Left;  with casting  . FOOT SURGERY    . HIP ARTHROPLASTY Left 05/04/2012   Procedure: ARTHROPLASTY BIPOLAR HIP;  Surgeon: Mauri Pole, MD;  Location: [facility];  Service: Orthopedics;  Laterality:  Left;  Marland Kitchen MASTECTOMY     double  . NASAL SINUS SURGERY    . OPEN REDUCTION INTERNAL FIXATION (ORIF) DISTAL RADIAL FRACTURE Left 05/29/2012   Procedure: OPEN REDUCTION INTERNAL FIXATION (ORIF) DISTAL RADIAL FRACTURE;  Surgeon: Linna Hoff, MD;  Location: [place];  Service: Orthopedics;  Laterality: Left;    Family History  Problem Relation Age of  Onset  . CAD Mother   . Hypertension Father   . Breast cancer Sister     Social History:  reports that she has never smoked. She has never used smokeless tobacco. She reports that she does not drink alcohol or use drugs.  Review of Systems     History of osteoporosis/osteopenia. She had a bone density in 2014 which showed osteopenia with T score -2.3. She had refused to take Fosamax. She did get a Reclast infusion without any side effects in 01/2013 She is taking her calcium and vitamin D preparation as well as 1000 units of vitamin D 3. Vitamin D level is normal  Had  multiple fractures from fall in 2014 Bone density 12/14 showed Osteopenia. Lowest T score of -2.3 at the right femoral neck.  She does not keep appointments that are being made And has been refusing to get another infusion  Has had COPD followed by pulmonologist   She has a history of hypercholesterolemia and has fair control with simvastatin   Lab Results  Component Value Date   CHOL 188 02/03/2016   HDL 60.20 02/03/2016   LDLCALC 111 (H) 02/03/2016   LDLDIRECT 93.8 11/05/2012   TRIG 85.0 02/03/2016   CHOLHDL 3 02/03/2016     THYROID nodule: She had a biopsy on this which was benign   She was seen in October for left-sided chest wall pain and this appears to be improved  Exam:  BP 138/70   Pulse 76   Ht 5\' 7"  (1.702 m)   Wt 126 lb (57.2 kg)   SpO2 93%   BMI 19.73 kg/m    She is alert  Tympanic membranes are normal bilaterally   no lymphadenopathy in the neck No thyroid enlargement Abdominal exam shows fullness in the left lower quadrant with  minimal tenderness.  No distinct mass out No hepatosplenomegaly  Lungs clear No pedal edema  Assessment/Plan:    HYPERTENSION: Blood pressure is controlled with  losartan and metoprolol   WEIGHT loss:  She has lost about 5 pounds in the last 2-3 months and is apparently not eating well She is having some constipation issues and has fullness and mild tenderness of the right lower quadrant CBC is normal  Will recommend GI consultation, also her family wants to get her evaluated again especially with her weight loss Recent labs are normal for liver functions Meanwhile she needs to start drinking Ensure for nutritional supplements She will try MiraLAX on a daily basis  History of left sided chest wall pain: Appears to be improved  DEMENTIA  Memory appears to be stable but not clear if she is having some behavioral issues  Will need evaluation from geriatric specialist and would also prefer to transfer total care to them  NEUROPATHY: Symptoms are fairly well controlled  Hyponatremia: Is resolved  Hypothyroidism: Stable and adequately replaced  LIPIDS: LDL is below 130 and will continue simvastatin  Probable tinnitus, mild.  Reassured  Mild?  Vertigo  Total visit time to evaluate and manage multiple problems = 25 minutes   Monay Houlton

## 2016-02-10 ENCOUNTER — Emergency Department (HOSPITAL_COMMUNITY): Payer: Medicare Other

## 2016-02-10 ENCOUNTER — Encounter (HOSPITAL_COMMUNITY): Payer: Self-pay

## 2016-02-10 ENCOUNTER — Emergency Department (HOSPITAL_COMMUNITY)
Admission: EM | Admit: 2016-02-10 | Discharge: 2016-02-10 | Disposition: A | Discharge status: Home / Self Care | Payer: Medicare Other | Attending: Emergency Medicine | Admitting: Emergency Medicine

## 2016-02-10 DIAGNOSIS — Z96642 Presence of left artificial hip joint: Secondary | ICD-10-CM | POA: Diagnosis not present

## 2016-02-10 DIAGNOSIS — R1030 Lower abdominal pain, unspecified: Secondary | ICD-10-CM | POA: Diagnosis not present

## 2016-02-10 DIAGNOSIS — R109 Unspecified abdominal pain: Secondary | ICD-10-CM | POA: Insufficient documentation

## 2016-02-10 DIAGNOSIS — K5900 Constipation, unspecified: Secondary | ICD-10-CM | POA: Diagnosis not present

## 2016-02-10 DIAGNOSIS — J449 Chronic obstructive pulmonary disease, unspecified: Secondary | ICD-10-CM | POA: Diagnosis not present

## 2016-02-10 DIAGNOSIS — E039 Hypothyroidism, unspecified: Secondary | ICD-10-CM | POA: Insufficient documentation

## 2016-02-10 DIAGNOSIS — K5730 Diverticulosis of large intestine without perforation or abscess without bleeding: Secondary | ICD-10-CM | POA: Diagnosis not present

## 2016-02-10 DIAGNOSIS — I10 Essential (primary) hypertension: Secondary | ICD-10-CM | POA: Diagnosis not present

## 2016-02-10 LAB — CBC WITH DIFFERENTIAL/PLATELET
BASOS ABS: 0 10*3/uL (ref 0.0–0.1)
BASOS PCT: 0 %
EOS ABS: 0.1 10*3/uL (ref 0.0–0.7)
Eosinophils Relative: 1 %
HCT: 39.2 % (ref 36.0–46.0)
Hemoglobin: 13.4 g/dL (ref 12.0–15.0)
Lymphocytes Relative: 28 %
Lymphs Abs: 2.1 10*3/uL (ref 0.7–4.0)
MCH: 29.1 pg (ref 26.0–34.0)
MCHC: 34.2 g/dL (ref 30.0–36.0)
MCV: 85 fL (ref 78.0–100.0)
MONOS PCT: 8 %
Monocytes Absolute: 0.6 10*3/uL (ref 0.1–1.0)
NEUTROS PCT: 63 %
Neutro Abs: 4.6 10*3/uL (ref 1.7–7.7)
Platelets: 265 10*3/uL (ref 150–400)
RBC: 4.61 MIL/uL (ref 3.87–5.11)
RDW: 14.2 % (ref 11.5–15.5)
WBC: 7.4 10*3/uL (ref 4.0–10.5)

## 2016-02-10 LAB — LIPASE, BLOOD: Lipase: 26 U/L (ref 11–51)

## 2016-02-10 LAB — URINALYSIS, ROUTINE W REFLEX MICROSCOPIC
Bilirubin Urine: NEGATIVE
Glucose, UA: NEGATIVE mg/dL
Hgb urine dipstick: NEGATIVE
Ketones, ur: NEGATIVE mg/dL
LEUKOCYTES UA: NEGATIVE
NITRITE: NEGATIVE
PH: 7 (ref 5.0–8.0)
Protein, ur: NEGATIVE mg/dL
SPECIFIC GRAVITY, URINE: 1.006 (ref 1.005–1.030)

## 2016-02-10 LAB — COMPREHENSIVE METABOLIC PANEL
ALBUMIN: 4.4 g/dL (ref 3.5–5.0)
ALT: 13 U/L — ABNORMAL LOW (ref 14–54)
ANION GAP: 9 (ref 5–15)
AST: 27 U/L (ref 15–41)
Alkaline Phosphatase: 61 U/L (ref 38–126)
BUN: 16 mg/dL (ref 6–20)
CO2: 27 mmol/L (ref 22–32)
Calcium: 9.2 mg/dL (ref 8.9–10.3)
Chloride: 98 mmol/L — ABNORMAL LOW (ref 101–111)
Creatinine, Ser: 0.95 mg/dL (ref 0.44–1.00)
GFR calc Af Amer: >60 mL/min (ref >60)
GFR calc non Af Amer: 53 mL/min — ABNORMAL LOW (ref >60)
GLUCOSE: 111 mg/dL — AB (ref 65–99)
POTASSIUM: 3.6 mmol/L (ref 3.5–5.1)
SODIUM: 134 mmol/L — AB (ref 135–145)
Total Bilirubin: 0.9 mg/dL (ref 0.3–1.2)
Total Protein: 7.5 g/dL (ref 6.5–8.1)

## 2016-02-10 MED ORDER — IOPAMIDOL (ISOVUE-300) INJECTION 61%
100.0000 mL | Freq: Once | INTRAVENOUS | Status: AC | PRN
  Administered 2016-02-10: 80 mL via INTRAVENOUS

## 2016-02-10 MED ORDER — IOPAMIDOL (ISOVUE-300) INJECTION 61%
INTRAVENOUS | Status: AC
  Filled 2016-02-10: qty 100

## 2016-02-10 NOTE — ED Notes (Signed)
Awaiting discharge paperwork to be completed.

## 2016-02-10 NOTE — ED Provider Notes (Signed)
Assumed care from [facility] at shift change.  See her note for full H&P.  Briefly, 83 y.o. F here with 1 week of lower abdominal pain and constipation.  No nausea/vomiting.  Lab work reassuring.  Plan:  CT scan pending.  Will follow results.  If no acute findings, will plan to discharge home.  Results for orders placed or performed during the hospital encounter of 02/10/16  CBC with Differential  Result Value Ref Range   WBC 7.4 4.0 - 10.5 K/uL   RBC 4.61 3.87 - 5.11 MIL/uL   Hemoglobin 13.4 12.0 - 15.0 g/dL   HCT 39.2 36.0 - 46.0 %   MCV 85.0 78.0 - 100.0 fL   MCH 29.1 26.0 - 34.0 pg   MCHC 34.2 30.0 - 36.0 g/dL   RDW 14.2 11.5 - 15.5 %   Platelets 265 150 - 400 K/uL   Neutrophils Relative % 63 %   Neutro Abs 4.6 1.7 - 7.7 K/uL   Lymphocytes Relative 28 %   Lymphs Abs 2.1 0.7 - 4.0 K/uL   Monocytes Relative 8 %   Monocytes Absolute 0.6 0.1 - 1.0 K/uL   Eosinophils Relative 1 %   Eosinophils Absolute 0.1 0.0 - 0.7 K/uL   Basophils Relative 0 %   Basophils Absolute 0.0 0.0 - 0.1 K/uL  Comprehensive metabolic panel  Result Value Ref Range   Sodium 134 (L) 135 - 145 mmol/L   Potassium 3.6 3.5 - 5.1 mmol/L   Chloride 98 (L) 101 - 111 mmol/L   CO2 27 22 - 32 mmol/L   Glucose, Bld 111 (H) 65 - 99 mg/dL   BUN 16 6 - 20 mg/dL   Creatinine, Ser 0.95 0.44 - 1.00 mg/dL   Calcium 9.2 8.9 - 10.3 mg/dL   Total Protein 7.5 6.5 - 8.1 g/dL   Albumin 4.4 3.5 - 5.0 g/dL   AST 27 15 - 41 U/L   ALT 13 (L) 14 - 54 U/L   Alkaline Phosphatase 61 38 - 126 U/L   Total Bilirubin 0.9 0.3 - 1.2 mg/dL   GFR calc non Af Amer 53 (L) >60 mL/min   GFR calc Af Amer >60 >60 mL/min   Anion gap 9 5 - 15  Lipase, blood  Result Value Ref Range   Lipase 26 11 - 51 U/L  Urinalysis, Routine w reflex microscopic  Result Value Ref Range   Color, Urine YELLOW YELLOW   APPearance CLEAR CLEAR   Specific Gravity, Urine 1.006 1.005 - 1.030   pH 7.0 5.0 - 8.0   Glucose, UA NEGATIVE NEGATIVE mg/dL   Hgb urine  dipstick NEGATIVE NEGATIVE   Bilirubin Urine NEGATIVE NEGATIVE   Ketones, ur NEGATIVE NEGATIVE mg/dL   Protein, ur NEGATIVE NEGATIVE mg/dL   Nitrite NEGATIVE NEGATIVE   Leukocytes, UA NEGATIVE NEGATIVE   Ct Abdomen Pelvis W Contrast  Result Date: 02/10/2016 CLINICAL DATA:  83 year old female with history of tightness in the abdominal area. Constipation. Unexplained weight loss. EXAM: CT ABDOMEN AND PELVIS WITH CONTRAST TECHNIQUE: Multidetector CT imaging of the abdomen and pelvis was performed using the standard protocol following bolus administration of intravenous contrast. CONTRAST:  93mL ISOVUE-300 IOPAMIDOL (ISOVUE-300) INJECTION 61% COMPARISON:  CT the abdomen and pelvis 07/07/2014. FINDINGS: Lower chest: Areas of bronchiectasis, thickening of the peribronchovascular interstitium and peribronchovascular micro and macronodularity with architectural distortion are again noted throughout the lung bases bilaterally (left greater than right), likely sequela of recurrent aspiration. Aortic atherosclerosis. Hepatobiliary: Low-attenuation lesions are again  noted in the left lobe of the liver, unchanged compared to prior studies, compatible with simple cysts, largest of which measures 2.0 cm in segment 3. No other suspicious hepatic lesions are noted. No intra or extrahepatic biliary ductal dilatation. Gallbladder is normal in appearance. Pancreas: No pancreatic mass. No pancreatic ductal dilatation. No pancreatic or peripancreatic fluid or inflammatory changes. Spleen: Unremarkable. Adrenals/Urinary Tract: Bilateral kidneys and bilateral adrenal glands are normal in appearance. There is no hydroureteronephrosis or perinephric stranding to indicate urinary tract obstruction at this time. Urinary bladder is partially obscured by beam hardening artifact from the patient's left hip arthroplasty, but visualized portions are unremarkable. Stomach/Bowel: The appearance of the stomach is normal. There is no  pathologic dilatation of small bowel or colon. In the region of the distal sigmoid colon there is some asymmetric mass-like thickening of the wall of the colon best appreciated on axial image 53 of series 2 and coronal image 76 of series 3 where this area measures approximately 4.6 x 2.2 x 2.7 cm. There is some slight haziness in the adjacent mesocolon. Several colonic diverticulae are also noted throughout other portions of the colon. The appendix is not confidently identified and may be surgically absent. Regardless, there are no inflammatory changes noted adjacent to the cecum to suggest the presence of an acute appendicitis at this time. Vascular/Lymphatic: Aortic atherosclerosis, without evidence of aneurysm or dissection in the abdominal or pelvic vasculature. No lymphadenopathy is noted in the abdomen or pelvis. Reproductive: Pelvis is partially obscured by beam hardening artifact from the patient's left hip arthroplasty. The uterus is not confidently identified, likely surgically absent. Ovaries are also not confidently identified may be surgically absent or atrophic. Other: Trace volume of ascites in the low anatomic pelvis. No pneumoperitoneum. Musculoskeletal: There are no aggressive appearing lytic or blastic lesions noted in the visualized portions of the skeleton. Status post left hip hemiarthroplasty. IMPRESSION: 1. There is an area of asymmetric mass-like thickening in the distal sigmoid colon, as above, concerning for potential primary colonic neoplasm. Further evaluation with nonemergent colonoscopy is strongly recommended in the near future to better evaluate this finding and exclude neoplasm. 2. No acute findings are otherwise noted in the abdomen or pelvis. 3. Colonic diverticulosis. 4. Extensive areas of bronchiectasis and chronic post infectious or inflammatory scarring in the lung bases, likely sequela of recurrent aspiration. 5. Aortic atherosclerosis. 6. Additional incidental findings, as  above. Electronically Signed   By: Vinnie Langton M.D.   On: 02/10/2016 16:23   CT scan concerning for possible colonic neoplasm (asymmetric thickening in distal sigmoid colon). Results were discussed with patient in great detail, she did acknowledge understanding.  Will refer back to her GI physician, Dr. Paulita Fujita, to scheduled colonoscopy for further evaluation.  Discussed plan with patient, she acknowledged understanding and agreed with plan of care.  Return precautions given for new or worsening symptoms.    Larene Pickett, PA-C 02/10/16 [place], MD 02/16/16 [phone]

## 2016-02-10 NOTE — ED Triage Notes (Signed)
Pt has not been having BM's.  Pt took laxative a few days ago with some result.  Pt states it is uncomfortable when she eats so she is not eating.  Drinking fluids ok.  No n/v.  No fever or difficulty urinating.

## 2016-02-10 NOTE — ED Notes (Signed)
Joanna [name] discharging pt at present time.

## 2016-02-10 NOTE — ED Provider Notes (Signed)
[facility] DEPT Provider Note   CSN: [id number] Arrival date & time: 02/10/16  1129    History   Chief Complaint Chief Complaint  Patient presents with  . poor appetite  . Constipation    HPI Joanna [name] is a 83 y.o. female.  83 year old female with a history of IBS, hypothyroid, esophageal reflux, and hypertension presents to the emergency department for evaluation of abdominal discomfort and constipation. Son reports anorexia over the past few weeks. He states "her last good meal was a week ago". Patient states that she eats food until it "fills me up". She denies any nausea or vomiting. She states that her last bowel movement was 4-5 days ago. She has not had any associated fevers. Patient states that this is an ongoing issue over the last 3-4 weeks. She has not had any urinary symptoms. She reports a history of hysterectomy as well as bladder surgery. She is unsure whether she has had an appendectomy. Appendix was reportedly "not definitely visualized" on CT scan in 2016.      Past Medical History:  Diagnosis Date  . Allergic rhinitis   . Asthma   . Complication of anesthesia    confusion after 04/2012  . GERD (gastroesophageal reflux disease)   . Hypertension   . Hypothyroid   . IBS (irritable bowel syndrome)     Patient Active Problem List   Diagnosis Date Noted  . Medication management 09/24/2014  . Cellulitis of left lower extremity 08/24/2013  . Cellulitis and abscess of leg, except foot 08/24/2013  . Urinary incontinence 12/09/2012  . Pain in joint, upper arm 10/30/2012  . Unspecified hereditary and idiopathic peripheral neuropathy 08/08/2012  . Extrinsic asthma 07/28/2012  . Pure hypercholesterolemia 07/28/2012  . Iron deficiency anemia, unspecified 06/26/2012  . Unspecified constipation 06/23/2012  . Osteoporosis, unspecified 05/08/2012  . Hypokalemia 05/07/2012  . Closed left hip fracture ([place]) 05/04/2012  . Fall due to stumbling 05/04/2012  .  Anemia 05/04/2012  . Hyponatremia 05/04/2012  . Bronchiectasis without acute exacerbation ([place]) 04/20/2010  . SINUSITIS, CHRONIC 02/03/2007  . 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 HYPERTENSION 01/01/2007  . ALLERGIC RHINITIS 01/01/2007  . Chronic obstructive airway disease with asthma ([place]) 01/01/2007  . GERD 01/01/2007  . Irritable bowel syndrome 01/01/2007  . DIVERTICULOSIS, COLON 10/07/2003    Past Surgical History:  Procedure Laterality Date  . ABDOMINAL HYSTERECTOMY    . BLADDER SURGERY    . CLOSED REDUCTION WRIST FRACTURE Left 05/04/2012   Procedure: CLOSED REDUCTION WRIST;  Surgeon: Mauri Pole, MD;  Location: [facility];  Service: Orthopedics;  Laterality: Left;  with casting  . FOOT SURGERY    . HIP ARTHROPLASTY Left 05/04/2012   Procedure: ARTHROPLASTY BIPOLAR HIP;  Surgeon: Mauri Pole, MD;  Location: [facility];  Service: Orthopedics;  Laterality: Left;  Marland Kitchen MASTECTOMY     double  . NASAL SINUS SURGERY    . OPEN REDUCTION INTERNAL FIXATION (ORIF) DISTAL RADIAL FRACTURE Left 05/29/2012   Procedure: OPEN REDUCTION INTERNAL FIXATION (ORIF) DISTAL RADIAL FRACTURE;  Surgeon: Linna Hoff, MD;  Location: [facility];  Service: Orthopedics;  Laterality: Left;    OB History    No data available       Home Medications    Prior to Admission medications   Medication Sig Start Date End Date Taking? Authorizing Provider  Calcium Carbonate (CALCIUM 600 PO) Take 1 tablet by mouth every morning.   Yes Historical Provider, MD  cholecalciferol (VITAMIN D) 1000  UNITS tablet Take 1,000 Units by mouth every morning.    Yes Historical Provider, MD  donepezil (ARICEPT) 5 MG tablet Take 1 tablet (5 mg total) by mouth at bedtime. 11/07/15  Yes Elayne Snare, MD  losartan (COZAAR) 25 MG tablet Take 1 tablet (25 mg total) by mouth daily. 11/07/15  Yes Elayne Snare, MD  metoprolol succinate (TOPROL-XL) 50 MG 24 hr tablet TAKE 1 TABLET BY MOUTH  DAILY WITH OR IMMEDIATLEY  FOLLOWING A MEAL 01/17/16  Yes  Elayne Snare, MD  Multiple Vitamins-Minerals (VISION FORMULA PO) Take 1 tablet by mouth every morning.   Yes Historical Provider, MD  nortriptyline (PAMELOR) 25 MG capsule TAKE 1 CAPSULE BY MOUTH AT  BEDTIME 01/23/16  Yes Elayne Snare, MD  pantoprazole (PROTONIX) 40 MG tablet Take 40 mg by mouth daily before breakfast. Reported on 07/29/2015   Yes Historical Provider, MD  potassium chloride (K-DUR) 10 MEQ tablet Take 1 tablet by mouth every other day 11/07/15  Yes Elayne Snare, MD  simvastatin (ZOCOR) 20 MG tablet Take 1 tablet by mouth at bedtime 05/06/15  Yes Elayne Snare, MD  SYNTHROID 125 MCG tablet TAKE ONE-HALF TABLET BY  MOUTH EVERY MORNING 12/05/15  Yes Elayne Snare, MD  vitamin C (ASCORBIC ACID) 500 MG tablet Take 500 mg by mouth every morning. Reported on 07/29/2015   Yes Historical Provider, MD  cyclobenzaprine (FLEXERIL) 5 MG tablet Take 1 tablet (5 mg total) by mouth 3 (three) times daily as needed for muscle spasms. Start with 1 at bedtime Patient not taking: Reported on 02/10/2016 11/07/15   Elayne Snare, MD  furosemide (LASIX) 20 MG tablet Take 1 tablet (20 mg total) by mouth every other day. Patient not taking: Reported on 02/10/2016 11/07/15   Elayne Snare, MD  gabapentin (NEURONTIN) 300 MG capsule TAKE 1 CAPSULE BY MOUTH 4  TIMES DAILY Patient not taking: Reported on 02/10/2016 01/17/16   Elayne Snare, MD  mometasone-formoterol (DULERA) 200-5 MCG/ACT AERO Inhale 2 puffs into the lungs 2 (two) times daily. Patient not taking: Reported on 02/10/2016 03/30/15   Collene Gobble, MD  traMADol (ULTRAM) 50 MG tablet Take 1 tablet (50 mg total) by mouth every 8 (eight) hours as needed. Patient not taking: Reported on 02/10/2016 09/14/15   Elayne Snare, MD    Family History Family History  Problem Relation Age of Onset  . CAD Mother   . Hypertension Father   . Breast cancer Sister     Social History Social History  Substance Use Topics  . Smoking status: Never Smoker  . Smokeless tobacco: Never Used  .  Alcohol use No     Allergies   Cefuroxime axetil; Lactose intolerance (gi); Clarithromycin; Codeine; and Sulfonamide derivatives   Review of Systems Review of Systems Ten systems reviewed and are negative for acute change, except as noted in the HPI.    Physical Exam Updated Vital Signs BP 159/68   Pulse 66   Temp 98 F (36.7 C) (Oral)   Resp 16   Ht 5\' 8"  (1.727 m)   Wt 57.2 kg   SpO2 100%   BMI 19.16 kg/m   Physical Exam  Constitutional: She is oriented to person, place, and time. She appears well-developed and well-nourished. No distress.  Nontoxic appearing and in NAD  HENT:  Head: Normocephalic and atraumatic.  Eyes: Conjunctivae and EOM are normal. No scleral icterus.  Neck: Normal range of motion.  Cardiovascular: Normal rate, regular rhythm and intact distal pulses.   Pulmonary/Chest: Effort  normal. No respiratory distress. She has no wheezes. She has no rales.  Respirations even and unlabored  Abdominal: Soft. She exhibits no distension and no mass. There is no tenderness. There is no guarding.  Soft, nondistended, nontender abdomen. No masses or peritoneal signs.  Genitourinary:  Genitourinary Comments: Normal rectal tone. Brown stool on DRE. No fecal impaction.  Musculoskeletal: Normal range of motion.  Neurological: She is alert and oriented to person, place, and time. She exhibits normal muscle tone. Coordination normal.  Skin: Skin is warm and dry. No rash noted. She is not diaphoretic. No erythema. No pallor.  Psychiatric: She has a normal mood and affect. Her behavior is normal.  Nursing note and vitals reviewed.    ED Treatments / Results  Labs (all labs ordered are listed, but only abnormal results are displayed) Labs Reviewed  COMPREHENSIVE METABOLIC PANEL - Abnormal; Notable for the following:       Result Value   Sodium 134 (*)    Chloride 98 (*)    Glucose, Bld 111 (*)    ALT 13 (*)    GFR calc non Af Amer 53 (*)    All other components  within normal limits  CBC WITH DIFFERENTIAL/PLATELET  LIPASE, BLOOD  URINALYSIS, ROUTINE W REFLEX MICROSCOPIC    EKG  EKG Interpretation None       Radiology No results found.  Procedures Procedures (including critical care time)  Medications Ordered in ED Medications  iopamidol (ISOVUE-300) 61 % injection (not administered)  iopamidol (ISOVUE-300) 61 % injection 100 mL (80 mLs Intravenous Contrast Given 02/10/16 1546)     Initial Impression / Assessment and Plan / ED Course  I have reviewed the triage vital signs and the nursing notes.  Pertinent labs & imaging results that were available during my care of the patient were reviewed by me and considered in my medical decision making (see chart for details).     Patient presenting for anorexia and constipation 3 weeks. She has a relatively nontender abdomen. No distention. No rigidity or peritoneal signs. Laboratory workup is reassuring. No evidence of fecal impaction on exam. CT pending at this time. Patient may require an enema if imaging suggests constipation. Patient care signed out to Quincy Carnes, PA-C at change of shift who will follow-up on imaging and disposition appropriately.   Final Clinical Impressions(s) / ED Diagnoses   Final diagnoses:  Constipation, unspecified constipation type    New Prescriptions New Prescriptions   No medications on file     Antonietta Breach, PA-C 02/10/16 [place], MD 02/10/16 1919

## 2016-02-10 NOTE — ED Notes (Signed)
Patient d/c'd self care.  F/U reviewed.  Patient verbalized understanding. 

## 2016-02-10 NOTE — Discharge Instructions (Signed)
As we discussed, your CT scan was concerning for possible mass in your colon. You need to follow-up with Dr. Paulita Fujita to have a colonoscopy for further evaluation of this. Return here for new concerns.

## 2016-03-11 ENCOUNTER — Other Ambulatory Visit: Payer: Self-pay | Admitting: Endocrinology

## 2016-06-13 ENCOUNTER — Telehealth: Payer: Self-pay | Admitting: Endocrinology

## 2016-06-13 NOTE — Telephone Encounter (Signed)
Please advise, patient does not need refills on rx right now however, once patient goes to [facility] will Dr. Dwyane Dee still do the rx refills? Please call Estill Bamberg, daughter, at work only [phone]

## 2016-06-14 NOTE — Telephone Encounter (Signed)
Called patient and left a voice message to call back to further discuss a plan for medications once patient is in [place].

## 2016-06-18 ENCOUNTER — Other Ambulatory Visit: Payer: Self-pay | Admitting: Endocrinology

## 2016-08-28 ENCOUNTER — Encounter: Payer: Self-pay | Admitting: Nurse Practitioner

## 2016-08-28 ENCOUNTER — Ambulatory Visit (INDEPENDENT_AMBULATORY_CARE_PROVIDER_SITE_OTHER): Payer: Medicare Other | Admitting: Nurse Practitioner

## 2016-08-28 VITALS — BP 162/78 | HR 66 | Temp 97.6°F | Resp 17 | Ht 68.0 in | Wt 129.6 lb

## 2016-08-28 DIAGNOSIS — M810 Age-related osteoporosis without current pathological fracture: Secondary | ICD-10-CM

## 2016-08-28 DIAGNOSIS — R413 Other amnesia: Secondary | ICD-10-CM

## 2016-08-28 DIAGNOSIS — D509 Iron deficiency anemia, unspecified: Secondary | ICD-10-CM | POA: Diagnosis not present

## 2016-08-28 DIAGNOSIS — E034 Atrophy of thyroid (acquired): Secondary | ICD-10-CM

## 2016-08-28 DIAGNOSIS — E7800 Pure hypercholesterolemia, unspecified: Secondary | ICD-10-CM | POA: Diagnosis not present

## 2016-08-28 DIAGNOSIS — I10 Essential (primary) hypertension: Secondary | ICD-10-CM | POA: Diagnosis not present

## 2016-08-28 DIAGNOSIS — J449 Chronic obstructive pulmonary disease, unspecified: Secondary | ICD-10-CM | POA: Diagnosis not present

## 2016-08-28 DIAGNOSIS — G609 Hereditary and idiopathic neuropathy, unspecified: Secondary | ICD-10-CM

## 2016-08-28 DIAGNOSIS — J4489 Other specified chronic obstructive pulmonary disease: Secondary | ICD-10-CM

## 2016-08-28 LAB — CBC WITH DIFFERENTIAL/PLATELET
BASOS PCT: 1 %
Basophils Absolute: 63 cells/uL (ref 0–200)
EOS PCT: 3 %
Eosinophils Absolute: 189 cells/uL (ref 15–500)
HCT: 37.6 % (ref 35.0–45.0)
HEMOGLOBIN: 12.4 g/dL (ref 11.7–15.5)
LYMPHS ABS: 2268 {cells}/uL (ref 850–3900)
LYMPHS PCT: 36 %
MCH: 28.8 pg (ref 27.0–33.0)
MCHC: 33 g/dL (ref 32.0–36.0)
MCV: 87.4 fL (ref 80.0–100.0)
MPV: 9.2 fL (ref 7.5–12.5)
Monocytes Absolute: 630 cells/uL (ref 200–950)
Monocytes Relative: 10 %
NEUTROS PCT: 50 %
Neutro Abs: 3150 cells/uL (ref 1500–7800)
PLATELETS: 281 10*3/uL (ref 140–400)
RBC: 4.3 MIL/uL (ref 3.80–5.10)
RDW: 14 % (ref 11.0–15.0)
WBC: 6.3 10*3/uL (ref 3.8–10.8)

## 2016-08-28 LAB — TSH: TSH: 2.83 m[IU]/L

## 2016-08-28 MED ORDER — DONEPEZIL HCL 10 MG PO TABS: 10.0000 mg | ORAL_TABLET | Freq: Every day | ORAL | 1 refills | Status: DC

## 2016-08-28 NOTE — Patient Instructions (Addendum)
Increase gabapentin to twice daily to help with pain- do this for 1 week if still having pain in afternoon may increase to three times daily  Take blood pressure at home- at least 30 mins after she has taken her blood pressure medication and has been sitting down for a few mins-- record and bring back to next visit    Joanna [name] stands for "Dietary Approaches to Stop Hypertension." The DASH eating plan is a healthy eating plan that has been shown to reduce high blood pressure (hypertension). It may also reduce your risk for type 2 diabetes, heart disease, and stroke. The DASH eating plan may also help with weight loss. What are tips for following this plan? General guidelines  Avoid eating more than 2,300 mg (milligrams) of salt (sodium) a day. If you have hypertension, you may need to reduce your sodium intake to 1,500 mg a day.  Limit alcohol intake to no more than 1 drink a day for nonpregnant women and 2 drinks a day for men. One drink equals 12 oz of beer, 5 oz of wine, or 1 oz of hard liquor.  Work with your health care provider to maintain a healthy body weight or to lose weight. Ask what an ideal weight is for you.  Get at least 30 minutes of exercise that causes your heart to beat faster (aerobic exercise) most days of the week. Activities may include walking, swimming, or biking.  Work with your health care provider or diet and nutrition specialist (dietitian) to adjust your eating plan to your individual calorie needs. Reading food labels  Check food labels for the amount of sodium per serving. Choose foods with less than 5 percent of the Daily Value of sodium. Generally, foods with less than 300 mg of sodium per serving fit into this eating plan.  To find whole grains, look for the word "whole" as the first word in the ingredient list. Shopping  Buy products labeled as "low-sodium" or "no salt added."  Buy fresh foods. Avoid canned foods and premade or frozen  meals. Cooking  Avoid adding salt when cooking. Use salt-free seasonings or herbs instead of table salt or sea salt. Check with your health care provider or pharmacist before using salt substitutes.  Do not fry foods. Cook foods using healthy methods such as baking, boiling, grilling, and broiling instead.  Cook with heart-healthy oils, such as olive, canola, soybean, or sunflower oil. Meal planning   Eat a balanced diet that includes: ? 5 or more servings of fruits and vegetables each day. At each meal, try to fill half of your plate with fruits and vegetables. ? Up to 6-8 servings of whole grains each day. ? Less than 6 oz of lean meat, poultry, or fish each day. A 3-oz serving of meat is about the same size as a deck of cards. One egg equals 1 oz. ? 2 servings of low-fat dairy each day. ? A serving of nuts, seeds, or beans 5 times each week. ? Heart-healthy fats. Healthy fats called Omega-3 fatty acids are found in foods such as flaxseeds and coldwater fish, like sardines, salmon, and mackerel.  Limit how much you eat of the following: ? Canned or prepackaged foods. ? Food that is high in trans fat, such as fried foods. ? Food that is high in saturated fat, such as fatty meat. ? Sweets, desserts, sugary drinks, and other foods with added sugar. ? Full-fat dairy products.  Do not salt foods before  eating.  Try to eat at least 2 vegetarian meals each week.  Eat more home-cooked food and less restaurant, buffet, and fast food.  When eating at a restaurant, ask that your food be prepared with less salt or no salt, if possible. What foods are recommended? The items listed may not be a complete list. Talk with your dietitian about what dietary choices are best for you. Grains Whole-grain or whole-wheat bread. Whole-grain or whole-wheat pasta. Brown rice. Joanna [name]. Bulgur. Whole-grain and low-sodium cereals. Pita bread. Low-fat, low-sodium crackers. Whole-wheat flour  tortillas. Vegetables Fresh or frozen vegetables (raw, steamed, roasted, or grilled). Low-sodium or reduced-sodium tomato and vegetable juice. Low-sodium or reduced-sodium tomato sauce and tomato paste. Low-sodium or reduced-sodium canned vegetables. Fruits All fresh, dried, or frozen fruit. Canned fruit in natural juice (without added sugar). Meat and other protein foods Skinless chicken or [place]. Ground chicken or [place]. Pork with fat trimmed off. Fish and seafood. Egg whites. Dried beans, peas, or lentils. Unsalted nuts, nut butters, and seeds. Unsalted canned beans. Lean cuts of beef with fat trimmed off. Low-sodium, lean deli meat. Dairy Low-fat (1%) or fat-free (skim) milk. Fat-free, low-fat, or reduced-fat cheeses. Nonfat, low-sodium ricotta or cottage cheese. Low-fat or nonfat yogurt. Low-fat, low-sodium cheese. Fats and oils Soft margarine without trans fats. Vegetable oil. Low-fat, reduced-fat, or light mayonnaise and salad dressings (reduced-sodium). Canola, safflower, olive, soybean, and sunflower oils. Avocado. Seasoning and other foods Herbs. Spices. Seasoning mixes without salt. Unsalted popcorn and pretzels. Fat-free sweets. What foods are not recommended? The items listed may not be a complete list. Talk with your dietitian about what dietary choices are best for you. Grains Baked goods made with fat, such as croissants, muffins, or some breads. Dry pasta or rice meal packs. Vegetables Creamed or fried vegetables. Vegetables in a cheese sauce. Regular canned vegetables (not low-sodium or reduced-sodium). Regular canned tomato sauce and paste (not low-sodium or reduced-sodium). Regular tomato and vegetable juice (not low-sodium or reduced-sodium). Joanna [name]. Olives. Fruits Canned fruit in a light or heavy syrup. Fried fruit. Fruit in cream or butter sauce. Meat and other protein foods Fatty cuts of meat. Ribs. Fried meat. Joanna [name]. Sausage. Bologna and other processed lunch meats.  Salami. Fatback. Hotdogs. Bratwurst. Salted nuts and seeds. Canned beans with added salt. Canned or smoked fish. Whole eggs or egg yolks. Chicken or [place] with skin. Dairy Whole or 2% milk, cream, and half-and-half. Whole or full-fat cream cheese. Whole-fat or sweetened yogurt. Full-fat cheese. Nondairy creamers. Whipped toppings. Processed cheese and cheese spreads. Fats and oils Butter. Stick margarine. Lard. Shortening. Ghee. Bacon fat. Tropical oils, such as coconut, palm kernel, or palm oil. Seasoning and other foods Salted popcorn and pretzels. Onion salt, garlic salt, seasoned salt, table salt, and sea salt. Worcestershire sauce. Tartar sauce. Barbecue sauce. Teriyaki sauce. Soy sauce, including reduced-sodium. Steak sauce. Canned and packaged gravies. Fish sauce. Oyster sauce. Cocktail sauce. Horseradish that you find on the shelf. Ketchup. Mustard. Meat flavorings and tenderizers. Bouillon cubes. Hot sauce and Tabasco sauce. Premade or packaged marinades. Premade or packaged taco seasonings. Relishes. Regular salad dressings. Where to find more information:  National Heart, Lung, and [place]: https://wilson-eaton.com/  American Heart Association: www.heart.org Summary  The DASH eating plan is a healthy eating plan that has been shown to reduce high blood pressure (hypertension). It may also reduce your risk for type 2 diabetes, heart disease, and stroke.  With the DASH eating plan, you should limit salt (sodium) intake to 2,300 mg a  day. If you have hypertension, you may need to reduce your sodium intake to 1,500 mg a day.  When on the DASH eating plan, aim to eat more fresh fruits and vegetables, whole grains, lean proteins, low-fat dairy, and heart-healthy fats.  Work with your health care provider or diet and nutrition specialist (dietitian) to adjust your eating plan to your individual calorie needs. This information is not intended to replace advice given to you by your health  care provider. Make sure you discuss any questions you have with your health care provider. Document Released: 12/21/2010 Document Revised: 12/26/2015 Document Reviewed: 12/26/2015 Elsevier Interactive Patient Education  2017 [employer].

## 2016-08-28 NOTE — Progress Notes (Signed)
Careteam: Patient Care Team: Elayne Snare, MD as PCP - General (Endocrinology) Asthma, Velora Heckler Allergy And as Consulting Physician (Allergy and Immunology) Arta Silence, MD as Consulting Physician (Gastroenterology) Paralee Cancel, MD as Consulting Physician (Orthopedic Surgery) Collene Gobble, MD as Consulting Physician (Pulmonary Disease)  Advanced Directive information Does Patient Have a Medical Advance Directive?: No  Allergies  Allergen Reactions  . Cefuroxime Axetil Hives    Not sure.. Patient can be allergic to it today but a year from now she may not be.  . Lactose Intolerance (Gi) Other (See Comments)    Gi upset  . Clarithromycin Palpitations  . Codeine Palpitations  . Sulfonamide Derivatives Palpitations    Chief Complaint  Patient presents with  . Medical Management of Chronic Issues    Pt is being see to establish care for management of chronic conditions.   Joanna [name]    Son, Sonia Side, in room     HPI: Patient is a 83 y.o. female seen in the office today to establish care. Pt previously seen by Dr Dwyane Dee by PCP- wanted something new. Hx of breast cancer- bilateral mastectomy over 20 years ago no longer following with oncologist.  Pt lives alone son and daughter-in-law live close, take her to appts, does medications for her, run her errands, take her to eat, etc.  Son trying to get her to move in with them to make sure she is taken care of properly, eating, and medications are taken but she likes her independence.  Unsure of allergies, pt states she does not know and son states he is not aware of any  Son not aware of medications brought the bag. Does a pill box and states she occasionally will miss a day.  OP- on calcium and vit D, did not want reclast back in 2014 Hyperlipidemia- not on any medication- does not modify diet.  Hypothyroid- conts on synthroid.  Recurrent sinusitis, allergies and asthma- has followed with allergist in the past but has not had  problems recently Lactose intolerant- drinks a lot of milk and ice cream- used to cut this completely but now eating more.  Constipation- miralax daily but has not taken recently  Neuropathy- in feet, using gabapentin only once daily.  Memory loss- started a few years ago, slowly has progressed.  Review of Systems:  Review of Systems  Constitutional: Positive for weight loss. Negative for chills and fever.  HENT: Negative for tinnitus.   Respiratory: Negative for cough, sputum production and shortness of breath.   Cardiovascular: Negative for chest pain, palpitations and leg swelling.  Gastrointestinal: Positive for abdominal pain (worse with milk products) and constipation. Negative for diarrhea and heartburn.  Genitourinary: Negative for dysuria, frequency and urgency.  Musculoskeletal: Negative for back pain, falls, joint pain and myalgias.  Skin: Negative.   Neurological: Positive for tingling and sensory change. Negative for dizziness and headaches.  Psychiatric/Behavioral: Negative for depression and memory loss. The patient does not have insomnia.     Past Medical History:  Diagnosis Date  . Allergic rhinitis   . Asthma   . Complication of anesthesia    confusion after 04/2012  . GERD (gastroesophageal reflux disease)   . History of urinary infection   . Hyperlipidemia   . Hypertension   . Hypothyroid   . IBS (irritable bowel syndrome)   . Neuropathy    Past Surgical History:  Procedure Laterality Date  . ABDOMINAL HYSTERECTOMY    . BLADDER SURGERY    . CLOSED  REDUCTION WRIST FRACTURE Left 05/04/2012   Procedure: CLOSED REDUCTION WRIST;  Surgeon: Mauri Pole, MD;  Location: [facility];  Service: Orthopedics;  Laterality: Left;  with casting  . FOOT SURGERY    . HIP ARTHROPLASTY Left 05/04/2012   Procedure: ARTHROPLASTY BIPOLAR HIP;  Surgeon: Mauri Pole, MD;  Location: [facility];  Service: Orthopedics;  Laterality: Left;  Marland Kitchen MASTECTOMY     double  . NASAL SINUS SURGERY     . OPEN REDUCTION INTERNAL FIXATION (ORIF) DISTAL RADIAL FRACTURE Left 05/29/2012   Procedure: OPEN REDUCTION INTERNAL FIXATION (ORIF) DISTAL RADIAL FRACTURE;  Surgeon: Linna Hoff, MD;  Location: [facility];  Service: Orthopedics;  Laterality: Left;   Social History:   reports that she has never smoked. She has never used smokeless tobacco. She reports that she does not drink alcohol or use drugs.  Family History  Problem Relation Age of Onset  . CAD Mother   . Hypertension Father   . Arthritis Sister   . Throat cancer Brother     Medications: Patient's Medications  New Prescriptions   No medications on file  Previous Medications   APOAEQUORIN (PREVAGEN EXTRA STRENGTH PO)    Take 1 tablet by mouth daily.   CALCIUM CARBONATE (CALCIUM 600 PO)    Take 1 tablet by mouth every morning.   CHOLECALCIFEROL (VITAMIN D) 1000 UNITS TABLET    Take 1,000 Units by mouth every morning.    DONEPEZIL (ARICEPT) 5 MG TABLET    TAKE 1 TABLET BY MOUTH AT  BEDTIME   FUROSEMIDE (LASIX) 20 MG TABLET    TAKE 1 TABLET BY MOUTH  EVERY OTHER DAY   GABAPENTIN (NEURONTIN) 300 MG CAPSULE    TAKE 1 CAPSULE BY MOUTH 4  TIMES DAILY   LOSARTAN (COZAAR) 25 MG TABLET    TAKE 1 TABLET BY MOUTH  DAILY   MAGNESIUM HYDROXIDE (MILK OF MAGNESIA) 400 MG/5ML SUSPENSION    Take 5 mLs by mouth daily as needed for mild constipation.   METOPROLOL SUCCINATE (TOPROL-XL) 50 MG 24 HR TABLET    TAKE 1 TABLET BY MOUTH  DAILY WITH OR IMMEDIATLEY  FOLLOWING A MEAL   MOMETASONE-FORMOTEROL (DULERA) 200-5 MCG/ACT AERO    Inhale 2 puffs into the lungs 2 (two) times daily.   NAPROXEN SODIUM (ANAPROX) 220 MG TABLET    Take 220 mg by mouth daily as needed.   NORTRIPTYLINE (PAMELOR) 25 MG CAPSULE    TAKE 1 CAPSULE BY MOUTH AT  BEDTIME   POLYETHYLENE GLYCOL (MIRALAX / GLYCOLAX) PACKET    Take 17 g by mouth daily.   POTASSIUM CHLORIDE (K-DUR,KLOR-CON) 10 MEQ TABLET    TAKE 1 TABLET BY MOUTH  EVERY OTHER DAY   SIMVASTATIN (ZOCOR) 20 MG TABLET    TAKE  1 TABLET BY MOUTH AT  BEDTIME   SYNTHROID 125 MCG TABLET    TAKE ONE-HALF TABLET BY  MOUTH EVERY MORNING  Modified Medications   No medications on file  Discontinued Medications   CYCLOBENZAPRINE (FLEXERIL) 5 MG TABLET    Take 1 tablet (5 mg total) by mouth 3 (three) times daily as needed for muscle spasms. Start with 1 at bedtime   FEXOFENADINE (ALLEGRA ALLERGY) 180 MG TABLET    Take 180 mg by mouth daily.   GUAIFENESIN (MUCINEX) 600 MG 12 HR TABLET    Take 600 mg by mouth 2 (two) times daily.   MULTIPLE VITAMINS-MINERALS (VISION FORMULA PO)    Take 1 tablet by mouth every  morning.   PANTOPRAZOLE (PROTONIX) 40 MG TABLET    Take 40 mg by mouth daily before breakfast. Reported on 07/29/2015   TRAMADOL (ULTRAM) 50 MG TABLET    Take 1 tablet (50 mg total) by mouth every 8 (eight) hours as needed.   VITAMIN C (ASCORBIC ACID) 500 MG TABLET    Take 500 mg by mouth every morning. Reported on 07/29/2015     Physical Exam:  Vitals:   08/28/16 0848  BP: (!) 168/88  Pulse: 66  Resp: 17  Temp: 97.6 F (36.4 C)  TempSrc: Oral  SpO2: 97%  Weight: 129 lb 9.6 oz (58.8 kg)  Height: '5\' 8"'  (1.727 m)   Body mass index is 19.71 kg/m.  Physical Exam  Constitutional: She appears well-developed and well-nourished. No distress.  HENT:  Head: Normocephalic and atraumatic.  Mouth/Throat: Oropharynx is clear and moist. No oropharyngeal exudate.  Eyes: Pupils are equal, round, and reactive to light. Conjunctivae are normal.  Neck: Normal range of motion. Neck supple.  Cardiovascular: Normal rate, regular rhythm and normal heart sounds.   Pulmonary/Chest: Effort normal.  diminshed throughout  Abdominal: Soft. Bowel sounds are normal. There is no tenderness.  Musculoskeletal: She exhibits no edema or tenderness.  Walks with a cane  Neurological: She is alert.  Skin: Skin is warm and dry. She is not diaphoretic.  Psychiatric: She has a normal mood and affect.   Labs reviewed: Basic Metabolic  Panel:  Recent Labs  10/07/15 1358 02/03/16 1317 02/10/16 1427  NA 136 139 134*  K 4.1 3.8 3.6  CL 99 101 98*  CO2 '30 30 27  ' GLUCOSE 79 114* 111*  BUN 34* 17 16  CREATININE 1.18 0.93 0.95  CALCIUM 9.0 9.5 9.2  TSH 2.32  --   --    Liver Function Tests:  Recent Labs  10/07/15 1358 02/03/16 1317 02/10/16 1427  AST '25 20 27  ' ALT 14 10 13*  ALKPHOS 49 52 61  BILITOT 0.6 0.6 0.9  PROT 7.2 6.9 7.5  ALBUMIN 3.9 3.8 4.4    Recent Labs  02/10/16 1427  LIPASE 26   No results for input(s): AMMONIA in the last 8760 hours. CBC:  Recent Labs  10/07/15 1358 02/03/16 1317 02/10/16 1427  WBC 7.8 5.9 7.4  NEUTROABS  --   --  4.6  HGB 12.9 12.7 13.4  HCT 38.1 37.7 39.2  MCV 86.1 84.8 85.0  PLT 313.0 311.0 265   Lipid Panel:  Recent Labs  02/03/16 1317  CHOL 188  HDL 60.20  LDLCALC 111*  TRIG 85.0  CHOLHDL 3   TSH:  Recent Labs  10/07/15 1358  TSH 2.32   A1C: No results found for: HGBA1C   Assessment/Plan 1. Essential hypertension Elevated today. Discussed diet modification- dash diet given.  -to take blood pressure at home and bring log to next visit.  -to cont losartan and lopressor.   2. Chronic obstructive airway disease with asthma (HCC) Stable on dulera  3. Hereditary and idiopathic peripheral neuropathy -only taking gabapentin 300 mg daily (Rx if for QID) to increase to BID for 1 week then TID if needed -will follow up at next OV. `  4. Osteoporosis without current pathological fracture, unspecified osteoporosis type -did not wish to take medication in the past but will consider at this time.  -cont calcium and vit D - DG Bone Density; Future  5. Iron deficiency anemia, unspecified iron deficiency anemia type -CBC to follow up  6. Pure  hypercholesterolemia -not currently on medication. Will follow up.  - Lipid Panel - CMP with eGFR  7. Memory loss Progressive decline, will increase aricept at this time.  - donepezil (ARICEPT) 10  MG tablet; Take 1 tablet (10 mg total) by mouth at bedtime.  Dispense: 30 tablet; Refill: 1 - CBC with Differential/Platelets - CMP with eGFR - TSH -will get MMSE at next OV.   8. Hypothyroidism due to acquired atrophy of thyroid -has missed doses but attempts to take medication as prescribed, will follow up TSH  Next appt: 1 month for AWV and EV Clement Deneault K. Harle Battiest  [facility] [phone] 8 am - 5 pm) [phone] (after hours)

## 2016-08-29 LAB — COMPLETE METABOLIC PANEL WITH GFR
ALT: 10 U/L (ref 6–29)
AST: 24 U/L (ref 10–35)
Albumin: 4.1 g/dL (ref 3.6–5.1)
Alkaline Phosphatase: 52 U/L (ref 33–130)
BUN: 19 mg/dL (ref 7–25)
CHLORIDE: 101 mmol/L (ref 98–110)
CO2: 25 mmol/L (ref 20–32)
CREATININE: 1.01 mg/dL — AB (ref 0.60–0.88)
Calcium: 9.2 mg/dL (ref 8.6–10.4)
GFR, Est African American: 59 mL/min — ABNORMAL LOW (ref >=60)
GFR, Est Non African American: 51 mL/min — ABNORMAL LOW (ref >=60)
Glucose, Bld: 80 mg/dL (ref 65–99)
POTASSIUM: 4.3 mmol/L (ref 3.5–5.3)
SODIUM: 136 mmol/L (ref 135–146)
Total Bilirubin: 0.7 mg/dL (ref 0.2–1.2)
Total Protein: 6.5 g/dL (ref 6.1–8.1)

## 2016-08-29 LAB — LIPID PANEL
Cholesterol: 214 mg/dL — ABNORMAL HIGH (ref <200)
HDL: 76 mg/dL (ref >50)
LDL CALC: 122 mg/dL — AB (ref <100)
Total CHOL/HDL Ratio: 2.8 Ratio (ref <5.0)
Triglycerides: 80 mg/dL (ref <150)
VLDL: 16 mg/dL (ref <30)

## 2016-09-07 ENCOUNTER — Ambulatory Visit (INDEPENDENT_AMBULATORY_CARE_PROVIDER_SITE_OTHER): Payer: Medicare Other

## 2016-09-07 VITALS — BP 140/70 | HR 68 | Temp 98.0°F | Ht 68.0 in | Wt 133.0 lb

## 2016-09-07 DIAGNOSIS — Z23 Encounter for immunization: Secondary | ICD-10-CM | POA: Diagnosis not present

## 2016-09-07 DIAGNOSIS — Z0000 Encounter for general adult medical examination without abnormal findings: Secondary | ICD-10-CM

## 2016-09-07 NOTE — Progress Notes (Signed)
Subjective:   Joanna [name] is a 83 y.o. female who presents for an Initial Medicare Annual Wellness Visit.       Objective:    Today's Vitals   09/07/16 1141  BP: 140/70  Pulse: 68  Temp: 98 F (36.7 C)  TempSrc: Oral  SpO2: 97%  Weight: 133 lb (60.3 kg)  Height: 5\' 8"  (1.727 m)  PainSc: 5    Body mass index is 20.22 kg/m.   Current Medications (verified) Outpatient Encounter Prescriptions as of 09/07/2016  Medication Sig  . Apoaequorin (PREVAGEN EXTRA STRENGTH PO) Take 1 tablet by mouth daily.  . Calcium Carbonate (CALCIUM 600 PO) Take 1 tablet by mouth every morning.  . cholecalciferol (VITAMIN D) 1000 UNITS tablet Take 1,000 Units by mouth every morning.   . donepezil (ARICEPT) 10 MG tablet Take 1 tablet (10 mg total) by mouth at bedtime.  . furosemide (LASIX) 20 MG tablet TAKE 1 TABLET BY MOUTH  EVERY OTHER DAY  . gabapentin (NEURONTIN) 300 MG capsule TAKE 1 CAPSULE BY MOUTH 4  TIMES DAILY  . losartan (COZAAR) 25 MG tablet TAKE 1 TABLET BY MOUTH  DAILY  . magnesium hydroxide (MILK OF MAGNESIA) 400 MG/5ML suspension Take 5 mLs by mouth daily as needed for mild constipation.  . metoprolol succinate (TOPROL-XL) 50 MG 24 hr tablet TAKE 1 TABLET BY MOUTH  DAILY WITH OR IMMEDIATLEY  FOLLOWING A MEAL  . mometasone-formoterol (DULERA) 200-5 MCG/ACT AERO Inhale 2 puffs into the lungs 2 (two) times daily.  . naproxen sodium (ANAPROX) 220 MG tablet Take 220 mg by mouth daily as needed.  . nortriptyline (PAMELOR) 25 MG capsule TAKE 1 CAPSULE BY MOUTH AT  BEDTIME  . polyethylene glycol (MIRALAX / GLYCOLAX) packet Take 17 g by mouth daily.  . potassium chloride (K-DUR,KLOR-CON) 10 MEQ tablet TAKE 1 TABLET BY MOUTH  EVERY OTHER DAY  . simvastatin (ZOCOR) 20 MG tablet TAKE 1 TABLET BY MOUTH AT  BEDTIME  . SYNTHROID 125 MCG tablet TAKE ONE-HALF TABLET BY  MOUTH EVERY MORNING   No facility-administered encounter medications on file as of 09/07/2016.     Allergies  (verified) Cefuroxime axetil; Lactose intolerance (gi); Clarithromycin; Codeine; and Sulfonamide derivatives   History: Past Medical History:  Diagnosis Date  . Allergic rhinitis   . Asthma   . Complication of anesthesia    confusion after 04/2012  . GERD (gastroesophageal reflux disease)   . History of urinary infection   . Hyperlipidemia   . Hypertension   . Hypothyroid   . IBS (irritable bowel syndrome)   . Neuropathy    Past Surgical History:  Procedure Laterality Date  . ABDOMINAL HYSTERECTOMY    . BLADDER SURGERY    . CLOSED REDUCTION WRIST FRACTURE Left 05/04/2012   Procedure: CLOSED REDUCTION WRIST;  Surgeon: Mauri Pole, MD;  Location: [facility];  Service: Orthopedics;  Laterality: Left;  with casting  . FOOT SURGERY    . HIP ARTHROPLASTY Left 05/04/2012   Procedure: ARTHROPLASTY BIPOLAR HIP;  Surgeon: Mauri Pole, MD;  Location: [facility];  Service: Orthopedics;  Laterality: Left;  Marland Kitchen MASTECTOMY     double  . NASAL SINUS SURGERY    . OPEN REDUCTION INTERNAL FIXATION (ORIF) DISTAL RADIAL FRACTURE Left 05/29/2012   Procedure: OPEN REDUCTION INTERNAL FIXATION (ORIF) DISTAL RADIAL FRACTURE;  Surgeon: Linna Hoff, MD;  Location: [facility];  Service: Orthopedics;  Laterality: Left;   Family History  Problem Relation Age of Onset  .  CAD Mother   . Hypertension Father   . Arthritis Sister   . Throat cancer Brother    Social History   Occupational History  . unemployed    Social History Main Topics  . Smoking status: Never Smoker  . Smokeless tobacco: Never Used  . Alcohol use No  . Drug use: No  . Sexual activity: Not Currently    Tobacco Counseling Counseling given: Not Answered   Activities of Daily Living In your present state of health, do you have any difficulty performing the following activities: 09/07/2016  Hearing? Y  Vision? N  Difficulty concentrating or making decisions? Y  Walking or climbing stairs? Y  Dressing or bathing? Y  Doing errands,  shopping? Y  Preparing Food and eating ? Y  Using the Toilet? Y  In the past six months, have you accidently leaked urine? Y  Do you have problems with loss of bowel control? N  Managing your Medications? Y  Managing your Finances? Y  Housekeeping or managing your Housekeeping? Y  Some recent data might be hidden    Immunizations and Health Maintenance Immunization History  Administered Date(s) Administered  . Influenza Split 09/25/2011  . Influenza Whole 10/18/2008, 10/18/2009, 09/16/2010  . Influenza, High Dose Seasonal PF 02/07/2016  . Influenza,inj,Quad PF,6+ Mos 10/21/2012, 09/24/2014  . Influenza-Unspecified 09/10/2013  . Pneumococcal Conjugate-13 09/30/2013  . Pneumococcal Polysaccharide-23 10/15/2005  . Tdap 08/06/2013   Health Maintenance Due  Topic Date Due  . INFLUENZA VACCINE  08/15/2016    Patient Care Team: Lauree Chandler, NP as PCP - General (Geriatric Medicine) Asthma, Velora Heckler Allergy And as Consulting Physician (Allergy and Immunology) Arta Silence, MD as Consulting Physician (Gastroenterology) Paralee Cancel, MD as Consulting Physician (Orthopedic Surgery) Collene Gobble, MD as Consulting Physician (Pulmonary Disease)  Indicate any recent Medical Services you may have received from other than [facility] providers in the past year (date may be approximate).     Assessment:   This is a routine wellness examination for [facility].   Hearing/Vision screen No exam data present  Dietary issues and exercise activities discussed: Current Exercise Habits: The patient does not participate in regular exercise at present, Exercise limited by: orthopedic condition(s)  Goals    . Exercise 3x per week (30 min per time)          Patient will start walking with family throughout the week      Depression Screen PHQ 2/9 Scores 09/07/2016  PHQ - 2 Score 1    Fall Risk Fall Risk  09/07/2016 08/28/2016  Falls in the past year? Yes Yes  Number falls in past yr: 1 2 or  more  Injury with Fall? Yes No  Comment R ankle -    Cognitive Function: MMSE - Mini Mental State Exam 09/07/2016  Orientation to time 2  Orientation to Place 1  Registration 3  Attention/ Calculation 5  Recall 0  Language- name 2 objects 2  Language- repeat 0  Language- follow 3 step command 3  Language- read & follow direction 1  Write a sentence 1  Copy design 1  Total score 19        Screening Tests Health Maintenance  Topic Date Due  . INFLUENZA VACCINE  08/15/2016  . TETANUS/TDAP  08/07/2023  . DEXA SCAN  Completed  . PNA vac Low Risk Adult  Completed      Plan:    I have personally reviewed and addressed the Medicare Annual Wellness questionnaire and have  noted the following in the patient's chart:  A. Medical and social history B. Use of alcohol, tobacco or illicit drugs  C. Current medications and supplements D. Functional ability and status E.  Nutritional status F.  Physical activity G. Advance directives H. List of other physicians I.  Hospitalizations, surgeries, and ER visits in previous 12 months J.  [place] to include hearing, vision, cognitive, depression L. Referrals and appointments - none  In addition, I have reviewed and discussed with patient certain preventive protocols, quality metrics, and best practice recommendations. A written personalized care plan for preventive services as well as general preventive health recommendations were provided to patient.  See attached scanned questionnaire for additional information.   Signed,   Rich Reining, RN Nurse Health Advisor   Quick Notes   Health Maintenance: Flu vaccine given.     Abnormal Screen: MMSE 19/30. Did not pass clock drawing     Patient Concerns: Pain in R foot from falling 3 weeks ago, pain in L hip. Pt & pt family request pain medication more than tylenol.     Nurse Concerns: None

## 2016-09-07 NOTE — Patient Instructions (Signed)
Joanna [name] , Thank you for taking time to come for your Medicare Wellness Visit. I appreciate your ongoing commitment to your health goals. Please review the following plan we discussed and let me know if I can assist you in the future.   Screening recommendations/referrals: Colonoscopy excluded, pt over age 81 Mammogram excluded, pt over age 53 Bone Density up to date Recommended yearly ophthalmology/optometry visit for glaucoma screening and checkup Recommended yearly dental visit for hygiene and checkup  Vaccinations: Influenza vaccine given. Due 2019 flu season Pneumococcal vaccine up to date Tdap vaccine up to date. Due 08/07/2023 Shingles vaccine due. If you decide you want this vaccine we will send prescription to your pharmacy  Advanced directives: Advance directive discussed with you today. I have provided a copy for you to complete at home and have notarized. Once this is complete please bring a copy in to our office so we can scan it into your chart.   Conditions/risks identified: None  Next appointment: Joanna Mustache NP 09/25/16 @ 10:45am   Preventive Care 65 Years and Older, Female Preventive care refers to lifestyle choices and visits with your health care provider that can promote health and wellness. What does preventive care include?  A yearly physical exam. This is also called an annual well check.  Dental exams once or twice a year.  Routine eye exams. Ask your health care provider how often you should have your eyes checked.  Personal lifestyle choices, including:  Daily care of your teeth and gums.  Regular physical activity.  Eating a healthy diet.  Avoiding tobacco and drug use.  Limiting alcohol use.  Practicing safe sex.  Taking low-dose aspirin every day.  Taking vitamin and mineral supplements as recommended by your health care provider. What happens during an annual well check? The services and screenings done by your health care  provider during your annual well check will depend on your age, overall health, lifestyle risk factors, and family history of disease. Counseling  Your health care provider may ask you questions about your:  Alcohol use.  Tobacco use.  Drug use.  Emotional well-being.  Home and relationship well-being.  Sexual activity.  Eating habits.  History of falls.  Memory and ability to understand (cognition).  Work and work Statistician.  Reproductive health. Screening  You may have the following tests or measurements:  Height, weight, and BMI.  Blood pressure.  Lipid and cholesterol levels. These may be checked every 5 years, or more frequently if you are over 88 years old.  Skin check.  Lung cancer screening. You may have this screening every year starting at age 82 if you have a 30-pack-year history of smoking and currently smoke or have quit within the past 15 years.  Fecal occult blood test (FOBT) of the stool. You may have this test every year starting at age 45.  Flexible sigmoidoscopy or colonoscopy. You may have a sigmoidoscopy every 5 years or a colonoscopy every 10 years starting at age 10.  Hepatitis C blood test.  Hepatitis B blood test.  Sexually transmitted disease (STD) testing.  Diabetes screening. This is done by checking your blood sugar (glucose) after you have not eaten for a while (fasting). You may have this done every 1-3 years.  Bone density scan. This is done to screen for osteoporosis. You may have this done starting at age 18.  Mammogram. This may be done every 1-2 years. Talk to your health care provider about how often you should have  regular mammograms. Talk with your health care provider about your test results, treatment options, and if necessary, the need for more tests. Vaccines  Your health care provider may recommend certain vaccines, such as:  Influenza vaccine. This is recommended every year.  Tetanus, diphtheria, and acellular  pertussis (Tdap, Td) vaccine. You may need a Td booster every 10 years.  Zoster vaccine. You may need this after age 38.  Pneumococcal 13-valent conjugate (PCV13) vaccine. One dose is recommended after age 78.  Pneumococcal polysaccharide (PPSV23) vaccine. One dose is recommended after age 36. Talk to your health care provider about which screenings and vaccines you need and how often you need them. This information is not intended to replace advice given to you by your health care provider. Make sure you discuss any questions you have with your health care provider. Document Released: 01/28/2015 Document Revised: 09/21/2015 Document Reviewed: 11/02/2014 Elsevier Interactive Patient Education  2017 [employer] Prevention in the Home Falls can cause injuries. They can happen to people of all ages. There are many things you can do to make your home safe and to help prevent falls. What can I do on the outside of my home?  Regularly fix the edges of walkways and driveways and fix any cracks.  Remove anything that might make you trip as you walk through a door, such as a raised step or threshold.  Trim any bushes or trees on the path to your home.  Use bright outdoor lighting.  Clear any walking paths of anything that might make someone trip, such as rocks or tools.  Regularly check to see if handrails are loose or broken. Make sure that both sides of any steps have handrails.  Any raised decks and porches should have guardrails on the edges.  Have any leaves, snow, or ice cleared regularly.  Use sand or salt on walking paths during winter.  Clean up any spills in your garage right away. This includes oil or grease spills. What can I do in the bathroom?  Use night lights.  Install grab bars by the toilet and in the tub and shower. Do not use towel bars as grab bars.  Use non-skid mats or decals in the tub or shower.  If you need to sit down in the shower, use a plastic,  non-slip stool.  Keep the floor dry. Clean up any water that spills on the floor as soon as it happens.  Remove soap buildup in the tub or shower regularly.  Attach bath mats securely with double-sided non-slip rug tape.  Do not have throw rugs and other things on the floor that can make you trip. What can I do in the bedroom?  Use night lights.  Make sure that you have a light by your bed that is easy to reach.  Do not use any sheets or blankets that are too big for your bed. They should not hang down onto the floor.  Have a firm chair that has side arms. You can use this for support while you get dressed.  Do not have throw rugs and other things on the floor that can make you trip. What can I do in the kitchen?  Clean up any spills right away.  Avoid walking on wet floors.  Keep items that you use a lot in easy-to-reach places.  If you need to reach something above you, use a strong step stool that has a grab bar.  Keep electrical cords out of the  way.  Do not use floor polish or wax that makes floors slippery. If you must use wax, use non-skid floor wax.  Do not have throw rugs and other things on the floor that can make you trip. What can I do with my stairs?  Do not leave any items on the stairs.  Make sure that there are handrails on both sides of the stairs and use them. Fix handrails that are broken or loose. Make sure that handrails are as long as the stairways.  Check any carpeting to make sure that it is firmly attached to the stairs. Fix any carpet that is loose or worn.  Avoid having throw rugs at the top or bottom of the stairs. If you do have throw rugs, attach them to the floor with carpet tape.  Make sure that you have a light switch at the top of the stairs and the bottom of the stairs. If you do not have them, ask someone to add them for you. What else can I do to help prevent falls?  Wear shoes that:  Do not have high heels.  Have rubber  bottoms.  Are comfortable and fit you well.  Are closed at the toe. Do not wear sandals.  If you use a stepladder:  Make sure that it is fully opened. Do not climb a closed stepladder.  Make sure that both sides of the stepladder are locked into place.  Ask someone to hold it for you, if possible.  Clearly mark and make sure that you can see:  Any grab bars or handrails.  First and last steps.  Where the edge of each step is.  Use tools that help you move around (mobility aids) if they are needed. These include:  Canes.  Walkers.  Scooters.  Crutches.  Turn on the lights when you go into a dark area. Replace any light bulbs as soon as they burn out.  Set up your furniture so you have a clear path. Avoid moving your furniture around.  If any of your floors are uneven, fix them.  If there are any pets around you, be aware of where they are.  Review your medicines with your doctor. Some medicines can make you feel dizzy. This can increase your chance of falling. Ask your doctor what other things that you can do to help prevent falls. This information is not intended to replace advice given to you by your health care provider. Make sure you discuss any questions you have with your health care provider. Document Released: 10/28/2008 Document Revised: 06/09/2015 Document Reviewed: 02/05/2014 Elsevier Interactive Patient Education  2017 [employer].

## 2016-09-10 ENCOUNTER — Telehealth: Payer: Self-pay | Admitting: Endocrinology

## 2016-09-10 NOTE — Telephone Encounter (Signed)
To be filled by new PCP

## 2016-09-10 NOTE — Telephone Encounter (Signed)
I filled all but Donepezil. Your message was not on this note when I checked the status of medications.

## 2016-09-25 ENCOUNTER — Encounter: Payer: Self-pay | Admitting: Nurse Practitioner

## 2016-09-25 ENCOUNTER — Ambulatory Visit (INDEPENDENT_AMBULATORY_CARE_PROVIDER_SITE_OTHER): Payer: Medicare Other | Admitting: Nurse Practitioner

## 2016-09-25 ENCOUNTER — Ambulatory Visit: Payer: Medicare Other | Admitting: Nurse Practitioner

## 2016-09-25 VITALS — BP 108/72 | HR 73 | Temp 98.4°F | Resp 18 | Ht 68.0 in | Wt 130.6 lb

## 2016-09-25 DIAGNOSIS — I10 Essential (primary) hypertension: Secondary | ICD-10-CM | POA: Diagnosis not present

## 2016-09-25 DIAGNOSIS — G609 Hereditary and idiopathic neuropathy, unspecified: Secondary | ICD-10-CM | POA: Diagnosis not present

## 2016-09-25 DIAGNOSIS — R609 Edema, unspecified: Secondary | ICD-10-CM

## 2016-09-25 DIAGNOSIS — M810 Age-related osteoporosis without current pathological fracture: Secondary | ICD-10-CM

## 2016-09-25 DIAGNOSIS — R413 Other amnesia: Secondary | ICD-10-CM

## 2016-09-25 DIAGNOSIS — J449 Chronic obstructive pulmonary disease, unspecified: Secondary | ICD-10-CM

## 2016-09-25 DIAGNOSIS — J4489 Other specified chronic obstructive pulmonary disease: Secondary | ICD-10-CM

## 2016-09-25 MED ORDER — MEMANTINE HCL-DONEPEZIL HCL 7 & 14 & 21 &28 -10 MG PO C4PK: 1.0000 | EXTENDED_RELEASE_CAPSULE | Freq: Every day | ORAL | 0 refills | Status: DC

## 2016-09-25 MED ORDER — GABAPENTIN 300 MG PO CAPS: 300.0000 mg | ORAL_CAPSULE | Freq: Two times a day (BID) | ORAL | 2 refills | Status: DC

## 2016-09-25 NOTE — Patient Instructions (Addendum)
Start namzaric (Aricept and Namenda combination) titration pack. Let us know if this medication is too expensive and we will call in separate medications.  HOLD aricept while on namzaric- there is aricept in the namzaric  Stop lasix and potasium at this time  Start back on Mucinex (can use MUCINEX DM if cough is bothering her) twice daily with full glass of water- make sure to drink the water that helps medication work Make sure you are taking your dulera twice daily

## 2016-09-25 NOTE — Progress Notes (Signed)
Careteam: Patient Care Team: Lauree Chandler, NP as PCP - General (Geriatric Medicine) Asthma, Velora Heckler [facility] And as Consulting Physician (Allergy and Immunology) Arta Silence, MD as Consulting Physician (Gastroenterology) Paralee Cancel, MD as Consulting Physician (Orthopedic Surgery) Collene Gobble, MD as Consulting Physician (Pulmonary Disease)  Advanced Directive information Does Patient Have a Medical Advance Directive?: No  Allergies  Allergen Reactions  . Cefuroxime Axetil Hives    Not sure.. Patient can be allergic to it today but a year from now she may not be.  . Lactose Intolerance (Gi) Other (See Comments)    Gi upset  . Clarithromycin Palpitations  . Codeine Palpitations  . Sulfonamide Derivatives Palpitations    Chief Complaint  Patient presents with  . Medical Management of Chronic Issues    Pt is being seen for a Medicare extended visit to follow up on chronic conditions.  . Other    Son in room     HPI: Patient is a 83 y.o. female seen in the office today for extended visit.  Had AWV with Clarise Cruz, RN on 09/07/16. Pt with hx of allergies, asthma, hx of breast cancer, OP, hyperlipidemia, recurrent sinusitis, neuropathy, dementia.  Asthma- chronic congestion and cough with shortness of breath. Has not seen a pulmonologist recnetly. Was placed on dulera but not using. Also has used mucinex in the past but not currently on. Was seeing Dr Gwenette Greet in 2016 and appears she was doing well on this.  Also staying hoarse all the time. Son reports this started after the beach. Feels like the cough, congestion and hoariness got worse after the beach.  Memory loss- aricept increased to 10 mg daily at bedtime. No adverse affects.  Stopped taking jelly fish supplement for memory No recent imagining of her head.   Feels like neuropathy/feet worse due to decrease in activity. Son made her walk more at the beach and leg/feet got better.   Using tylenol as needed for  pain. Not using NSAIDS.   Review of Systems:  Review of Systems  Constitutional: Positive for weight loss. Negative for chills and fever.  HENT: Negative for tinnitus.   Respiratory: Positive for cough, sputum production and shortness of breath. Negative for wheezing.   Cardiovascular: Negative for chest pain, palpitations and leg swelling.  Gastrointestinal: Negative for abdominal pain, constipation, diarrhea and heartburn.  Genitourinary: Negative for dysuria, frequency and urgency.  Musculoskeletal: Negative for back pain, falls, joint pain and myalgias.  Skin: Negative.   Neurological: Positive for tingling and sensory change. Negative for dizziness and headaches.  Psychiatric/Behavioral: Negative for depression and memory loss. The patient does not have insomnia.     Past Medical History:  Diagnosis Date  . Allergic rhinitis   . Asthma   . Complication of anesthesia    confusion after 04/2012  . GERD (gastroesophageal reflux disease)   . History of urinary infection   . Hyperlipidemia   . Hypertension   . Hypothyroid   . IBS (irritable bowel syndrome)   . Neuropathy    Past Surgical History:  Procedure Laterality Date  . ABDOMINAL HYSTERECTOMY    . BLADDER SURGERY    . CLOSED REDUCTION WRIST FRACTURE Left 05/04/2012   Procedure: CLOSED REDUCTION WRIST;  Surgeon: Mauri Pole, MD;  Location: [facility];  Service: Orthopedics;  Laterality: Left;  with casting  . FOOT SURGERY    . HIP ARTHROPLASTY Left 05/04/2012   Procedure: ARTHROPLASTY BIPOLAR HIP;  Surgeon: Mauri Pole, MD;  Location:  [facility];  Service: Orthopedics;  Laterality: Left;  Marland Kitchen MASTECTOMY     double  . NASAL SINUS SURGERY    . OPEN REDUCTION INTERNAL FIXATION (ORIF) DISTAL RADIAL FRACTURE Left 05/29/2012   Procedure: OPEN REDUCTION INTERNAL FIXATION (ORIF) DISTAL RADIAL FRACTURE;  Surgeon: Linna Hoff, MD;  Location: [facility];  Service: Orthopedics;  Laterality: Left;   Social History:   reports that she has  never smoked. She has never used smokeless tobacco. She reports that she does not drink alcohol or use drugs.  Family History  Problem Relation Age of Onset  . CAD Mother   . Hypertension Father   . Arthritis Sister   . Throat cancer Brother     Medications: Patient's Medications  New Prescriptions   No medications on file  Previous Medications   CALCIUM CARBONATE (CALCIUM 600 PO)    Take 1 tablet by mouth every morning.   CHOLECALCIFEROL (VITAMIN D) 1000 UNITS TABLET    Take 1,000 Units by mouth every morning.    DONEPEZIL (ARICEPT) 10 MG TABLET    Take 1 tablet (10 mg total) by mouth at bedtime.   FUROSEMIDE (LASIX) 20 MG TABLET    TAKE 1 TABLET BY MOUTH  EVERY OTHER DAY   LOSARTAN (COZAAR) 25 MG TABLET    TAKE 1 TABLET BY MOUTH  DAILY   MAGNESIUM HYDROXIDE (MILK OF MAGNESIA) 400 MG/5ML SUSPENSION    Take 5 mLs by mouth daily as needed for mild constipation.   METOPROLOL SUCCINATE (TOPROL-XL) 50 MG 24 HR TABLET    TAKE 1 TABLET BY MOUTH  DAILY WITH OR IMMEDIATLEY  FOLLOWING A MEAL   MOMETASONE-FORMOTEROL (DULERA) 200-5 MCG/ACT AERO    Inhale 2 puffs into the lungs 2 (two) times daily.   NORTRIPTYLINE (PAMELOR) 25 MG CAPSULE    TAKE 1 CAPSULE BY MOUTH AT  BEDTIME   POLYETHYLENE GLYCOL (MIRALAX / GLYCOLAX) PACKET    Take 17 g by mouth daily.   POTASSIUM CHLORIDE (K-DUR,KLOR-CON) 10 MEQ TABLET    TAKE 1 TABLET BY MOUTH  EVERY OTHER DAY   SIMVASTATIN (ZOCOR) 20 MG TABLET    TAKE 1 TABLET BY MOUTH AT  BEDTIME   SYNTHROID 125 MCG TABLET    TAKE ONE-HALF TABLET BY  MOUTH EVERY MORNING  Modified Medications   Modified Medication Previous Medication   GABAPENTIN (NEURONTIN) 300 MG CAPSULE gabapentin (NEURONTIN) 300 MG capsule      Take 1 capsule (300 mg total) by mouth 2 (two) times daily.    TAKE 1 CAPSULE BY MOUTH 4  TIMES DAILY  Discontinued Medications   APOAEQUORIN (PREVAGEN EXTRA STRENGTH PO)    Take 1 tablet by mouth daily.   NAPROXEN SODIUM (ANAPROX) 220 MG TABLET    Take 220 mg by  mouth daily as needed.     Physical Exam:  Vitals:   09/25/16 1050  BP: 108/72  Pulse: 73  Resp: 18  Temp: 98.4 F (36.9 C)  TempSrc: Oral  SpO2: 96%  Weight: 130 lb 9.6 oz (59.2 kg)  Height: 5\' 8"  (1.727 m)   Body mass index is 19.86 kg/m.  Physical Exam  Constitutional: She appears well-developed and well-nourished. No distress.  HENT:  Head: Normocephalic and atraumatic.  Mouth/Throat: Oropharynx is clear and moist. No oropharyngeal exudate.  Eyes: Pupils are equal, round, and reactive to light. Conjunctivae are normal.  Neck: Normal range of motion. Neck supple.  Cardiovascular: Normal rate, regular rhythm and normal heart sounds.   Pulmonary/Chest:  Effort normal.  diminshed throughout  Abdominal: Soft. Bowel sounds are normal. There is no tenderness.  Musculoskeletal: Normal range of motion. She exhibits no edema, tenderness or deformity.  Walks with a cane  Neurological: She is alert. No cranial nerve deficit.  Skin: Skin is warm and dry. She is not diaphoretic.  Psychiatric: She has a normal mood and affect.    Labs reviewed: Basic Metabolic Panel:  Recent Labs  10/07/15 1358 02/03/16 1317 02/10/16 1427 08/28/16 1000  NA 136 139 134* 136  K 4.1 3.8 3.6 4.3  CL 99 101 98* 101  CO2 30 30 27 25   GLUCOSE 79 114* 111* 80  BUN 34* 17 16 19   CREATININE 1.18 0.93 0.95 1.01*  CALCIUM 9.0 9.5 9.2 9.2  TSH 2.32  --   --  2.83   Liver Function Tests:  Recent Labs  02/03/16 1317 02/10/16 1427 08/28/16 1000  AST 20 27 24   ALT 10 13* 10  ALKPHOS 52 61 52  BILITOT 0.6 0.9 0.7  PROT 6.9 7.5 6.5  ALBUMIN 3.8 4.4 4.1    Recent Labs  02/10/16 1427  LIPASE 26   No results for input(s): AMMONIA in the last 8760 hours. CBC:  Recent Labs  02/03/16 1317 02/10/16 1427 08/28/16 1000  WBC 5.9 7.4 6.3  NEUTROABS  --  4.6 3,150  HGB 12.7 13.4 12.4  HCT 37.7 39.2 37.6  MCV 84.8 85.0 87.4  PLT 311.0 265 281   Lipid Panel:  Recent Labs   02/03/16 1317 08/28/16 1000  CHOL 188 214*  HDL 60.20 76  LDLCALC 111* 122*  TRIG 85.0 80  CHOLHDL 3 2.8   TSH:  Recent Labs  10/07/15 1358 08/28/16 1000  TSH 2.32 2.83   A1C: No results found for: HGBA1C  MMSE - Mini Mental State Exam 09/07/2016  Orientation to time 2  Orientation to Place 1  Registration 3  Attention/ Calculation 5  Recall 0  Language- name 2 objects 2  Language- repeat 0  Language- follow 3 step command 3  Language- read & follow direction 1  Write a sentence 1  Copy design 1  Total score 19    Assessment/Plan 1. Memory loss -progressive memory loss. Pt still lives alone but Son would like her to move in with her. Encouraged life alert due to hx of falls (non recently) - RPR - CT Head Wo Contrast; Future - Memantine HCl-Donepezil HCl (NAMZARIC) 7 & 14 & 21 &28 -10 MG C4PK; Take 1 capsule by mouth daily.  Dispense: 1 each; Refill: 0  2. Osteoporosis without current pathological fracture, unspecified osteoporosis type -order has been placed for follow up dexa scan.  -Recommended to take caltrate with D 600/400 twice a day.   3. Chronic obstructive airway disease with asthma ([place]) Does not appear that she is taking her dulera. Encouraged twice daily dulera and mucinex by mouth twice daily with full glass of water.   4. Essential hypertension Stable, if anything on the low side today. Will dc lasix due to worsen renal function with questionable water intake to avoid dehydration.   5. Hereditary and idiopathic peripheral neuropathy -improved with activity and using gabapentin twice daily at this time. Will cont current regimen.   6. Edema -resolved to LE; Will stop lasix and potassium supplement.   Next appt: 3 months with Dr Mariea Clonts for physician visit, sooner if needed  Janett Billow K. Harle Battiest  [facility] [phone] 8 am -  5 pm) [phone] (after hours)

## 2016-09-26 LAB — RPR: RPR Ser Ql: NONREACTIVE

## 2016-10-04 ENCOUNTER — Telehealth: Payer: Self-pay | Admitting: *Deleted

## 2016-10-04 NOTE — Telephone Encounter (Signed)
Estill Bamberg, caregiver called and was clarifying the Namzaric. Stated that husband has not taken Rx to the pharmacy yet. Stated that she is going to call patient's pharmacy and check the price first and then get it filled. I told her that if patient tolerates the titration pak, then we would send Rx for Namzaric to her mail order pharmacy. She will call once almost completes.

## 2016-10-31 ENCOUNTER — Other Ambulatory Visit: Payer: Self-pay | Admitting: *Deleted

## 2016-10-31 DIAGNOSIS — R413 Other amnesia: Secondary | ICD-10-CM

## 2016-10-31 MED ORDER — MEMANTINE HCL-DONEPEZIL HCL 7 & 14 & 21 &28 -10 MG PO C4PK: 1.0000 | EXTENDED_RELEASE_CAPSULE | Freq: Every day | ORAL | 0 refills | Status: DC

## 2016-10-31 NOTE — Telephone Encounter (Signed)
Estill Bamberg, daughter in law called and stated that [facility] does not have the Namzaric in stock. Wants it called to [facility]. Faxed.

## 2016-11-05 ENCOUNTER — Ambulatory Visit: Payer: Medicare Other | Admitting: Pharmacotherapy

## 2016-11-06 ENCOUNTER — Telehealth: Payer: Self-pay | Admitting: Endocrinology

## 2016-11-06 NOTE — Telephone Encounter (Signed)
She has transferred to another PCP

## 2016-11-06 NOTE — Telephone Encounter (Signed)
I refused with the note that she is no longer under your care.

## 2016-11-06 NOTE — Telephone Encounter (Signed)
Patient has not been seen since 02/07/2016. Please advise if okay to refill?

## 2016-11-27 ENCOUNTER — Other Ambulatory Visit: Payer: Self-pay | Admitting: *Deleted

## 2016-11-27 MED ORDER — MEMANTINE HCL-DONEPEZIL HCL ER 28-10 MG PO CP24: 1.0000 | ORAL_CAPSULE | Freq: Every day | ORAL | 3 refills | Status: DC

## 2016-11-27 NOTE — Telephone Encounter (Signed)
Patient daughter, Estill Bamberg called and stated that patient is finished with titration pak and needs Rx faxed to [facility]. Faxed.

## 2016-11-29 ENCOUNTER — Encounter: Payer: Self-pay | Admitting: Nurse Practitioner

## 2016-12-04 ENCOUNTER — Telehealth: Payer: Self-pay | Admitting: *Deleted

## 2016-12-04 NOTE — Telephone Encounter (Signed)
Tried calling son, Sonia Side and the Mailbox is Full and cannot accept new messages. Will try again later.

## 2016-12-04 NOTE — Telephone Encounter (Signed)
It appears the namzaric was started in September.. Has she been taking it since then and has she been dizzy since? Any change she stopped it for a while and then restarted it? This can cause dizziness

## 2016-12-04 NOTE — Telephone Encounter (Signed)
Okay lets cut her back to Namzaric 21/10 mg daily- this sounds like it was the last dose she could tolerate. Please send Rx to pharmacy and have her try this, we can always attempt to go up on mg later but I would just have her cont the 21/10 mg daily of namzaric.

## 2016-12-04 NOTE — Telephone Encounter (Signed)
Patient son, Evelena Peat called and stated that since patient was started on the Namzaric she has been complaining of Dizziness. Son is wanting to Discontinue the medication or try going every other day. Please Advise.

## 2016-12-04 NOTE — Telephone Encounter (Signed)
Joanna [name] called back, patient started with dizziness once she started on the strongest dose of titration pack. Patient is currently on the blue pill, was unable to provide strength.   Patient never stopped medication once starting   Please advise

## 2016-12-05 MED ORDER — MEMANTINE HCL-DONEPEZIL HCL ER 21-10 MG PO CP24: 1.0000 | ORAL_CAPSULE | Freq: Every day | ORAL | 2 refills | Status: DC

## 2016-12-05 NOTE — Telephone Encounter (Signed)
Son, Evelena Peat Notified and agreed. Medication list updated and Rx faxed to pharmacy

## 2016-12-12 ENCOUNTER — Telehealth: Payer: Self-pay | Admitting: *Deleted

## 2016-12-12 ENCOUNTER — Other Ambulatory Visit: Payer: Self-pay | Admitting: *Deleted

## 2016-12-12 ENCOUNTER — Other Ambulatory Visit: Payer: Self-pay | Admitting: Nurse Practitioner

## 2016-12-12 MED ORDER — MEMANTINE HCL ER 21 MG PO CP24
1.0000 | ORAL_CAPSULE | Freq: Every day | ORAL | 3 refills | Status: DC
Start: 2016-12-12 — End: 2017-06-14

## 2016-12-12 MED ORDER — MEMANTINE HCL-DONEPEZIL HCL ER 28-10 MG PO CP24: 1.0000 | ORAL_CAPSULE | Freq: Every day | ORAL | 3 refills | Status: DC

## 2016-12-12 MED ORDER — MEMANTINE HCL-DONEPEZIL HCL ER 21-10 MG PO CP24: 1.0000 | ORAL_CAPSULE | Freq: Every day | ORAL | 2 refills | Status: DC

## 2016-12-12 MED ORDER — DONEPEZIL HCL 10 MG PO TABS: 10.0000 mg | ORAL_TABLET | Freq: Every day | ORAL | 3 refills | Status: DC

## 2016-12-12 NOTE — Telephone Encounter (Signed)
Phoned in to pharmacy. Spoke with Pharmacist. Medication list updated.

## 2016-12-12 NOTE — Telephone Encounter (Signed)
Patient son requested medication to be faxed to pharmacy. Stated that the pharmacy never received the one faxed on 12/05/16. Refaxed.

## 2016-12-12 NOTE — Addendum Note (Signed)
Addended by: Rafael Bihari A on: 12/12/2016 04:00 PM   Modules accepted: Orders

## 2016-12-12 NOTE — Telephone Encounter (Signed)
Oh, we will need to separate these pills. Can u call and see if the pharmacy can give her Namenda Xr 21 mg and then she can take Aricept 10 mg daily separately, if not then we can just have her take the "renal" dose of namzaric which is 14/10

## 2016-12-12 NOTE — Telephone Encounter (Signed)
[facility] called and stated that the Namzaric 21/10 you prescribed is not Available in that dose. Please prescribe Alternative. Please Advise.

## 2016-12-12 NOTE — Telephone Encounter (Signed)
See Message dated 11/20. Patient could not tolerate the 28/10 so you changed it to Namzaric 21/10 instead. It was causing dizziness.  Please Advise.

## 2016-12-12 NOTE — Telephone Encounter (Signed)
This should be namzaric 28/10 by mouth daily, sent to [facility]

## 2016-12-25 ENCOUNTER — Other Ambulatory Visit: Payer: Self-pay | Admitting: Endocrinology

## 2016-12-27 ENCOUNTER — Encounter: Payer: Self-pay | Admitting: Internal Medicine

## 2016-12-27 ENCOUNTER — Ambulatory Visit (INDEPENDENT_AMBULATORY_CARE_PROVIDER_SITE_OTHER): Payer: Medicare Other | Admitting: Internal Medicine

## 2016-12-27 VITALS — BP 138/70 | HR 68 | Temp 98.4°F | Wt 129.0 lb

## 2016-12-27 DIAGNOSIS — F0280 Dementia in other diseases classified elsewhere without behavioral disturbance: Secondary | ICD-10-CM

## 2016-12-27 DIAGNOSIS — E039 Hypothyroidism, unspecified: Secondary | ICD-10-CM | POA: Diagnosis not present

## 2016-12-27 DIAGNOSIS — G609 Hereditary and idiopathic neuropathy, unspecified: Secondary | ICD-10-CM

## 2016-12-27 DIAGNOSIS — I519 Heart disease, unspecified: Secondary | ICD-10-CM | POA: Diagnosis not present

## 2016-12-27 DIAGNOSIS — G301 Alzheimer's disease with late onset: Secondary | ICD-10-CM | POA: Diagnosis not present

## 2016-12-27 DIAGNOSIS — J449 Chronic obstructive pulmonary disease, unspecified: Secondary | ICD-10-CM

## 2016-12-27 DIAGNOSIS — D509 Iron deficiency anemia, unspecified: Secondary | ICD-10-CM | POA: Diagnosis not present

## 2016-12-27 DIAGNOSIS — R627 Adult failure to thrive: Secondary | ICD-10-CM | POA: Diagnosis not present

## 2016-12-27 DIAGNOSIS — J4489 Other specified chronic obstructive pulmonary disease: Secondary | ICD-10-CM

## 2016-12-27 MED ORDER — ALBUTEROL SULFATE HFA 108 (90 BASE) MCG/ACT IN AERS
2.0000 | INHALATION_SPRAY | Freq: Four times a day (QID) | RESPIRATORY_TRACT | 5 refills | Status: AC | PRN
Start: 2016-12-27 — End: ?

## 2016-12-27 MED ORDER — METOPROLOL SUCCINATE ER 50 MG PO TB24: 50.0000 mg | ORAL_TABLET | Freq: Every day | ORAL | 3 refills | Status: DC

## 2016-12-27 NOTE — Progress Notes (Signed)
Location:  [facility] clinic Provider:  Charlot Gouin L. Mariea Clonts, D.O., C.M.D.  Code Status: FULL CODE--requires discussion with son, no ACP documents on file Goals of Care:  Advanced Directives 09/25/2016  Does Patient Have a Medical Advance Directive? No  Type of Advance Directive -  Does patient want to make changes to medical advance directive? -  Copy of [place] in Chart? -  Would patient like information on creating a medical advance directive? -  Pre-existing out of facility DNR order (yellow form or pink MOST form) -   Chief Complaint  Patient presents with  . Follow-up    constipation, left knee and foot pain 27months    HPI: Patient is a 83 y.o. female seen today for medical management of chronic diseases.    Says she's not good at all.  Her feet hurt and she can't walk well.  They are numb.   No falls.  Uses cane to walk.  Only fall was several years ago when she broke her hip.    Weight oscillates around 130 lbs.  Only moved in with her son 3 days ago.  Her son makes sure she eats well, he gives her her pills and makes sure she takes them.  Her daughter in law helps her bathe.  She dresses herself with assistance.  She's had constipation. She is to be taking miralax. Says she's always had some trouble with this.  Says she has not had a bm in a week.  She had a problem with it once and her son took her to the ED--she had an enema, cscope, etc and nothing was wrong.  She had not been taking her pills right, eating right.    Has asthma:  On dulera.  Did not have rescue inhaler so ordered that.  She has a lot of congestion and her son is going to get her mucinex.   Past Medical History:  Diagnosis Date  . Allergic rhinitis   . Asthma   . Complication of anesthesia    confusion after 04/2012  . GERD (gastroesophageal reflux disease)   . History of urinary infection   . Hyperlipidemia   . Hypertension   . Hypothyroid   . IBS (irritable bowel syndrome)   .  Neuropathy     Past Surgical History:  Procedure Laterality Date  . ABDOMINAL HYSTERECTOMY    . BLADDER SURGERY    . CLOSED REDUCTION WRIST FRACTURE Left 05/04/2012   Procedure: CLOSED REDUCTION WRIST;  Surgeon: Mauri Pole, MD;  Location: [facility];  Service: Orthopedics;  Laterality: Left;  with casting  . FOOT SURGERY    . HIP ARTHROPLASTY Left 05/04/2012   Procedure: ARTHROPLASTY BIPOLAR HIP;  Surgeon: Mauri Pole, MD;  Location: [facility];  Service: Orthopedics;  Laterality: Left;  Marland Kitchen MASTECTOMY     double  . NASAL SINUS SURGERY    . OPEN REDUCTION INTERNAL FIXATION (ORIF) DISTAL RADIAL FRACTURE Left 05/29/2012   Procedure: OPEN REDUCTION INTERNAL FIXATION (ORIF) DISTAL RADIAL FRACTURE;  Surgeon: Linna Hoff, MD;  Location: [facility];  Service: Orthopedics;  Laterality: Left;    Allergies  Allergen Reactions  . Cefuroxime Axetil Hives    Not sure.. Patient can be allergic to it today but a year from now she may not be.  . Lactose Intolerance (Gi) Other (See Comments)    Gi upset  . Clarithromycin Palpitations  . Codeine Palpitations  . Sulfonamide Derivatives Palpitations    Outpatient Encounter Medications  as of 12/27/2016  Medication Sig  . Calcium Carbonate (CALCIUM 600 PO) Take 1 tablet by mouth every morning.  . cholecalciferol (VITAMIN D) 1000 UNITS tablet Take 1,000 Units by mouth every morning.   . donepezil (ARICEPT) 10 MG tablet Take 1 tablet (10 mg total) by mouth at bedtime.  . gabapentin (NEURONTIN) 300 MG capsule Take 1 capsule (300 mg total) by mouth 2 (two) times daily.  Marland Kitchen losartan (COZAAR) 25 MG tablet TAKE 1 TABLET BY MOUTH  DAILY  . magnesium hydroxide (MILK OF MAGNESIA) 400 MG/5ML suspension Take 5 mLs by mouth daily as needed for mild constipation.  . Memantine HCl ER (NAMENDA XR) 21 MG CP24 Take 1 tablet by mouth daily.  . metoprolol succinate (TOPROL-XL) 50 MG 24 hr tablet Take 1 tablet (50 mg total) by mouth daily. Take with or immediately following a  meal.  . mometasone-formoterol (DULERA) 200-5 MCG/ACT AERO Inhale 2 puffs into the lungs 2 (two) times daily.  . nortriptyline (PAMELOR) 25 MG capsule TAKE 1 CAPSULE BY MOUTH AT  BEDTIME  . polyethylene glycol (MIRALAX / GLYCOLAX) packet Take 17 g by mouth daily.  . simvastatin (ZOCOR) 20 MG tablet TAKE 1 TABLET BY MOUTH AT  BEDTIME  . SYNTHROID 125 MCG tablet TAKE ONE-HALF TABLET BY  MOUTH EVERY MORNING  . [DISCONTINUED] metoprolol succinate (TOPROL-XL) 50 MG 24 hr tablet TAKE 1 TABLET BY MOUTH  DAILY WITH OR IMMEDIATLEY  FOLLOWING A MEAL   No facility-administered encounter medications on file as of 12/27/2016.     Review of Systems:  Review of Systems  Constitutional: Positive for malaise/fatigue. Negative for chills and fever.       Weight was dropping when living alone, now lives with son  HENT: Positive for congestion and hearing loss.   Eyes:       Glasses  Respiratory: Positive for shortness of breath and wheezing. Negative for cough.   Cardiovascular: Negative for chest pain, palpitations and leg swelling.  Gastrointestinal: Positive for abdominal pain and constipation. Negative for blood in stool, diarrhea and melena.  Genitourinary: Negative for dysuria.  Musculoskeletal: Positive for joint pain. Negative for falls.       Left hip  Skin: Negative for itching and rash.  Neurological: Positive for tingling and sensory change.  Psychiatric/Behavioral: Positive for memory loss. The patient is nervous/anxious.        Fixated on medical conditions per son and seems to "want attention" per him about them; was not taking meds properly home alone before this    Health Maintenance  Topic Date Due  . TETANUS/TDAP  08/07/2023  . INFLUENZA VACCINE  Completed  . DEXA SCAN  Completed  . PNA vac Low Risk Adult  Completed    Physical Exam: Vitals:   12/27/16 1353  BP: 138/70  Pulse: 68  Temp: 98.4 F (36.9 C)  TempSrc: Oral  SpO2: 97%  Weight: 129 lb (58.5 kg)   Body mass  index is 19.61 kg/m. Physical Exam  Constitutional: No distress.  Frail white female ambulates with cane only, unsteady  HENT:  Head: Normocephalic and atraumatic.  HOH  Eyes: EOM are normal. Pupils are equal, round, and reactive to light.  glasses  Neck: Neck supple.  Cardiovascular: Normal rate, regular rhythm and intact distal pulses.  Murmur heard. Left 2nd to 3rd ICS unusual murmur present--can't describe  Pulmonary/Chest: Effort normal. She has wheezes.  Abdominal: Soft. Bowel sounds are normal. She exhibits no distension and no mass. There is no  tenderness. There is no rebound and no guarding.  Musculoskeletal: Normal range of motion. She exhibits tenderness.  Over left lateral hip region  Lymphadenopathy:    She has no cervical adenopathy.  Neurological: She is alert. A sensory deficit is present. No cranial nerve deficit.  Oriented, but poor historian, repeats her symptoms again and again  Skin: Skin is warm and dry. There is pallor.  Psychiatric:  Pleasant, confused    Labs reviewed: Basic Metabolic Panel: Recent Labs    02/03/16 1317 02/10/16 1427 08/28/16 1000  NA 139 134* 136  K 3.8 3.6 4.3  CL 101 98* 101  CO2 30 27 25   GLUCOSE 114* 111* 80  BUN 17 16 19   CREATININE 0.93 0.95 1.01*  CALCIUM 9.5 9.2 9.2  TSH  --   --  2.83   Liver Function Tests: Recent Labs    02/03/16 1317 02/10/16 1427 08/28/16 1000  AST 20 27 24   ALT 10 13* 10  ALKPHOS 52 61 52  BILITOT 0.6 0.9 0.7  PROT 6.9 7.5 6.5  ALBUMIN 3.8 4.4 4.1   Recent Labs    02/10/16 1427  LIPASE 26   No results for input(s): AMMONIA in the last 8760 hours. CBC: Recent Labs    02/03/16 1317 02/10/16 1427 08/28/16 1000  WBC 5.9 7.4 6.3  NEUTROABS  --  4.6 3,150  HGB 12.7 13.4 12.4  HCT 37.7 39.2 37.6  MCV 84.8 85.0 87.4  PLT 311.0 265 281   Lipid Panel: Recent Labs    02/03/16 1317 08/28/16 1000  CHOL 188 214*  HDL 60.20 76  LDLCALC 111* 122*  TRIG 85.0 80  CHOLHDL 3 2.8     Assessment/Plan 1. Chronic obstructive airway disease with asthma (HCC) - cont dulera--reinforced need for regular use -add rescue inhaler for when she's sob, coughing, wheezing  - albuterol (PROVENTIL HFA;VENTOLIN HFA) 108 (90 Base) MCG/ACT inhaler; Inhale 2 puffs into the lungs every 6 (six) hours as needed for wheezing or shortness of breath.  Dispense: 1 Inhaler; Refill: 5  2. Hereditary and idiopathic peripheral neuropathy -had been on B12 at one time, but level was quite high last year and it appears it was discontinued -continues with this complaint despite gabapentin 300mg  po bid -may benefit from recheck on B12 at next appt -last tsh normal  3. Iron deficiency anemia, unspecified iron deficiency anemia type -noted, last hgb in normal range, not on iron -expect this should stabilize with a more regular diet living with family  4. Myxedema heart disease -noted in her history, last tsh normal--unclear how long ago this really was and son and pt do not know -?murmur due to this or other newer valvular pathology -stress echo and echo do not mention valvular disease and no ischemia on the stress myoview from Feb 2010; Sept 2010 one has no report -if pt with increased dyspnea, might consider echo, but not having this now and needs asthma better controlled first  5. Late onset Alzheimer's disease without behavioral disturbance -pt with functional decline--needing support from family--has moved in with her son due to increased assistance with bathing, dressing, grooming and medications, meal prep and reminders to eat -continues on namenda XR and aricept -d/c nortriptyline which counteracts aricept b/c it's anticholinergic  6. Adult failure to thrive syndrome -noted due to #5, cont family support and encouragement  Use miralax for constipation--samples provided.    Use aspercreme for hip and scheduled tylenol.  Labs/tests ordered:  No orders of the  defined types were placed in  this encounter.  Next appt:  3 mos med mgt with Janett Billow and f/u hip pain  Lilliam Chamblee L. Denesha Brouse, D.O. [facility] [address], [place] [address] Cell Phone (Mon-Fri 8am-5pm):  [phone] On Call:  [phone] & follow prompts after 5pm & weekends Office Phone:  [phone] Office Fax:  [phone]

## 2016-12-27 NOTE — Patient Instructions (Signed)
Try topicals on the left hip like aspercreme.  Continue scheduled tylenol each day for pain.    We'll add a rescue inhaler for your breathing when your chest gets tight.    Stop nortriptyline which counteracts the aricept for memory and causes constipation and dry mouth.  Use miralax daily for constipation.

## 2017-01-02 ENCOUNTER — Telehealth: Payer: Self-pay

## 2017-01-02 NOTE — Telephone Encounter (Signed)
Joanna Molly, NP; Denyse Amass, CMA        Several attempts have been made to schedule patient for CT HEAD WO CONTRAST including a letter to patient. We have been unsuccessful in getting patient to return our calls or [facility] calls patient was scheduled for an appointment 12/27/16 @ 1:30 with Dr. Mariea Clonts and I sent a message to her & Karena Addison, I received a reply stating it was not their patient. So I don't believe this was addressed at the visit.    (Response required in order to close out referral)    I left a message on voicemail of son and relative listed as patient contact regarding the need to schedule head CT that was ordered on 09/25/16.

## 2017-01-03 NOTE — Telephone Encounter (Signed)
Attempted to call Cletus Gash at [phone] but had to leave a message asking for a return call.

## 2017-01-12 ENCOUNTER — Encounter (HOSPITAL_COMMUNITY): Payer: Self-pay | Admitting: Radiology

## 2017-01-12 ENCOUNTER — Emergency Department (HOSPITAL_COMMUNITY): Payer: Medicare Other

## 2017-01-12 ENCOUNTER — Inpatient Hospital Stay (HOSPITAL_COMMUNITY): Payer: Medicare Other

## 2017-01-12 ENCOUNTER — Inpatient Hospital Stay (HOSPITAL_COMMUNITY)
Admission: EM | Admit: 2017-01-12 | Discharge: 2017-01-16 | DRG: 065 | Disposition: A | Discharge status: Skilled Nursing Facility | Payer: Medicare Other | Attending: Internal Medicine | Admitting: Internal Medicine

## 2017-01-12 DIAGNOSIS — E7800 Pure hypercholesterolemia, unspecified: Secondary | ICD-10-CM

## 2017-01-12 DIAGNOSIS — R41841 Cognitive communication deficit: Secondary | ICD-10-CM | POA: Diagnosis not present

## 2017-01-12 DIAGNOSIS — R29810 Facial weakness: Secondary | ICD-10-CM | POA: Diagnosis present

## 2017-01-12 DIAGNOSIS — Z8249 Family history of ischemic heart disease and other diseases of the circulatory system: Secondary | ICD-10-CM | POA: Diagnosis not present

## 2017-01-12 DIAGNOSIS — K589 Irritable bowel syndrome without diarrhea: Secondary | ICD-10-CM | POA: Diagnosis present

## 2017-01-12 DIAGNOSIS — Z881 Allergy status to other antibiotic agents status: Secondary | ICD-10-CM

## 2017-01-12 DIAGNOSIS — J449 Chronic obstructive pulmonary disease, unspecified: Secondary | ICD-10-CM | POA: Diagnosis present

## 2017-01-12 DIAGNOSIS — Z882 Allergy status to sulfonamides status: Secondary | ICD-10-CM | POA: Diagnosis not present

## 2017-01-12 DIAGNOSIS — Z8261 Family history of arthritis: Secondary | ICD-10-CM

## 2017-01-12 DIAGNOSIS — R29818 Other symptoms and signs involving the nervous system: Secondary | ICD-10-CM | POA: Diagnosis not present

## 2017-01-12 DIAGNOSIS — M79672 Pain in left foot: Secondary | ICD-10-CM | POA: Diagnosis present

## 2017-01-12 DIAGNOSIS — F0390 Unspecified dementia without behavioral disturbance: Secondary | ICD-10-CM

## 2017-01-12 DIAGNOSIS — R2681 Unsteadiness on feet: Secondary | ICD-10-CM | POA: Diagnosis not present

## 2017-01-12 DIAGNOSIS — R531 Weakness: Secondary | ICD-10-CM | POA: Diagnosis not present

## 2017-01-12 DIAGNOSIS — E039 Hypothyroidism, unspecified: Secondary | ICD-10-CM | POA: Diagnosis not present

## 2017-01-12 DIAGNOSIS — Z9013 Acquired absence of bilateral breasts and nipples: Secondary | ICD-10-CM

## 2017-01-12 DIAGNOSIS — R4182 Altered mental status, unspecified: Secondary | ICD-10-CM | POA: Diagnosis not present

## 2017-01-12 DIAGNOSIS — I619 Nontraumatic intracerebral hemorrhage, unspecified: Secondary | ICD-10-CM | POA: Diagnosis not present

## 2017-01-12 DIAGNOSIS — I10 Essential (primary) hypertension: Secondary | ICD-10-CM | POA: Diagnosis not present

## 2017-01-12 DIAGNOSIS — I639 Cerebral infarction, unspecified: Secondary | ICD-10-CM | POA: Diagnosis not present

## 2017-01-12 DIAGNOSIS — Z808 Family history of malignant neoplasm of other organs or systems: Secondary | ICD-10-CM | POA: Diagnosis not present

## 2017-01-12 DIAGNOSIS — Z79899 Other long term (current) drug therapy: Secondary | ICD-10-CM

## 2017-01-12 DIAGNOSIS — K219 Gastro-esophageal reflux disease without esophagitis: Secondary | ICD-10-CM | POA: Diagnosis present

## 2017-01-12 DIAGNOSIS — R29705 NIHSS score 5: Secondary | ICD-10-CM | POA: Diagnosis present

## 2017-01-12 DIAGNOSIS — I629 Nontraumatic intracranial hemorrhage, unspecified: Secondary | ICD-10-CM | POA: Diagnosis not present

## 2017-01-12 DIAGNOSIS — E785 Hyperlipidemia, unspecified: Secondary | ICD-10-CM | POA: Diagnosis present

## 2017-01-12 DIAGNOSIS — I361 Nonrheumatic tricuspid (valve) insufficiency: Secondary | ICD-10-CM | POA: Diagnosis not present

## 2017-01-12 DIAGNOSIS — D509 Iron deficiency anemia, unspecified: Secondary | ICD-10-CM | POA: Diagnosis present

## 2017-01-12 DIAGNOSIS — I63412 Cerebral infarction due to embolism of left middle cerebral artery: Secondary | ICD-10-CM

## 2017-01-12 DIAGNOSIS — I679 Cerebrovascular disease, unspecified: Secondary | ICD-10-CM | POA: Diagnosis not present

## 2017-01-12 DIAGNOSIS — Z888 Allergy status to other drugs, medicaments and biological substances status: Secondary | ICD-10-CM | POA: Diagnosis not present

## 2017-01-12 DIAGNOSIS — R278 Other lack of coordination: Secondary | ICD-10-CM | POA: Diagnosis not present

## 2017-01-12 DIAGNOSIS — Z885 Allergy status to narcotic agent status: Secondary | ICD-10-CM

## 2017-01-12 DIAGNOSIS — M6281 Muscle weakness (generalized): Secondary | ICD-10-CM | POA: Diagnosis not present

## 2017-01-12 DIAGNOSIS — Z90710 Acquired absence of both cervix and uterus: Secondary | ICD-10-CM

## 2017-01-12 DIAGNOSIS — Z8673 Personal history of transient ischemic attack (TIA), and cerebral infarction without residual deficits: Secondary | ICD-10-CM | POA: Diagnosis not present

## 2017-01-12 DIAGNOSIS — G8191 Hemiplegia, unspecified affecting right dominant side: Secondary | ICD-10-CM | POA: Diagnosis present

## 2017-01-12 DIAGNOSIS — G629 Polyneuropathy, unspecified: Secondary | ICD-10-CM | POA: Diagnosis present

## 2017-01-12 DIAGNOSIS — I6523 Occlusion and stenosis of bilateral carotid arteries: Secondary | ICD-10-CM | POA: Diagnosis present

## 2017-01-12 DIAGNOSIS — R404 Transient alteration of awareness: Secondary | ICD-10-CM | POA: Diagnosis not present

## 2017-01-12 DIAGNOSIS — Z79890 Hormone replacement therapy (postmenopausal): Secondary | ICD-10-CM

## 2017-01-12 DIAGNOSIS — M810 Age-related osteoporosis without current pathological fracture: Secondary | ICD-10-CM | POA: Diagnosis present

## 2017-01-12 LAB — COMPREHENSIVE METABOLIC PANEL
ALT: 12 U/L — AB (ref 14–54)
AST: 25 U/L (ref 15–41)
Albumin: 3.7 g/dL (ref 3.5–5.0)
Alkaline Phosphatase: 50 U/L (ref 38–126)
Anion gap: 8 (ref 5–15)
BUN: 23 mg/dL — AB (ref 6–20)
CHLORIDE: 108 mmol/L (ref 101–111)
CO2: 23 mmol/L (ref 22–32)
CREATININE: 1.04 mg/dL — AB (ref 0.44–1.00)
Calcium: 8.9 mg/dL (ref 8.9–10.3)
GFR, EST AFRICAN AMERICAN: 55 mL/min — AB (ref >60)
GFR, EST NON AFRICAN AMERICAN: 47 mL/min — AB (ref >60)
Glucose, Bld: 104 mg/dL — ABNORMAL HIGH (ref 65–99)
POTASSIUM: 3.6 mmol/L (ref 3.5–5.1)
SODIUM: 139 mmol/L (ref 135–145)
Total Bilirubin: 0.8 mg/dL (ref 0.3–1.2)
Total Protein: 6.3 g/dL — ABNORMAL LOW (ref 6.5–8.1)

## 2017-01-12 LAB — I-STAT TROPONIN, ED: TROPONIN I, POC: 0 ng/mL (ref 0.00–0.08)

## 2017-01-12 LAB — DIFFERENTIAL
BASOS ABS: 0 10*3/uL (ref 0.0–0.1)
BASOS PCT: 0 %
EOS ABS: 0.1 10*3/uL (ref 0.0–0.7)
Eosinophils Relative: 1 %
Lymphocytes Relative: 32 %
Lymphs Abs: 2.7 10*3/uL (ref 0.7–4.0)
Monocytes Absolute: 0.5 10*3/uL (ref 0.1–1.0)
Monocytes Relative: 6 %
NEUTROS ABS: 5.3 10*3/uL (ref 1.7–7.7)
NEUTROS PCT: 61 %

## 2017-01-12 LAB — CBC
HCT: 37.8 % (ref 36.0–46.0)
Hemoglobin: 12.2 g/dL (ref 12.0–15.0)
MCH: 28.8 pg (ref 26.0–34.0)
MCHC: 32.3 g/dL (ref 30.0–36.0)
MCV: 89.2 fL (ref 78.0–100.0)
PLATELETS: 229 10*3/uL (ref 150–400)
RBC: 4.24 MIL/uL (ref 3.87–5.11)
RDW: 14.3 % (ref 11.5–15.5)
WBC: 8.7 10*3/uL (ref 4.0–10.5)

## 2017-01-12 LAB — I-STAT CHEM 8, ED
BUN: 24 mg/dL — ABNORMAL HIGH (ref 6–20)
CREATININE: 1 mg/dL (ref 0.44–1.00)
Calcium, Ion: 1.11 mmol/L — ABNORMAL LOW (ref 1.15–1.40)
Chloride: 109 mmol/L (ref 101–111)
Glucose, Bld: 101 mg/dL — ABNORMAL HIGH (ref 65–99)
HEMATOCRIT: 36 % (ref 36.0–46.0)
HEMOGLOBIN: 12.2 g/dL (ref 12.0–15.0)
POTASSIUM: 3.6 mmol/L (ref 3.5–5.1)
SODIUM: 144 mmol/L (ref 135–145)
TCO2: 24 mmol/L (ref 22–32)

## 2017-01-12 LAB — PROTIME-INR
INR: 1
PROTHROMBIN TIME: 13.1 s (ref 11.4–15.2)

## 2017-01-12 LAB — CBG MONITORING, ED: Glucose-Capillary: 89 mg/dL (ref 65–99)

## 2017-01-12 LAB — APTT: APTT: 28 s (ref 24–36)

## 2017-01-12 MED ORDER — MEMANTINE HCL ER 7 MG PO CP24
21.0000 mg | ORAL_CAPSULE | Freq: Every day | ORAL | Status: DC
  Administered 2017-01-13 – 2017-01-16 (×4): 21 mg via ORAL
  Filled 2017-01-12: qty 1
  Filled 2017-01-12 (×3): qty 3

## 2017-01-12 MED ORDER — METOPROLOL SUCCINATE ER 25 MG PO TB24
50.0000 mg | ORAL_TABLET | Freq: Every day | ORAL | Status: DC
  Administered 2017-01-13 – 2017-01-16 (×4): 50 mg via ORAL
  Filled 2017-01-12 (×4): qty 2

## 2017-01-12 MED ORDER — STROKE: EARLY STAGES OF RECOVERY BOOK
Freq: Once | Status: AC
  Administered 2017-01-12: 23:00:00

## 2017-01-12 MED ORDER — ENOXAPARIN SODIUM 30 MG/0.3ML ~~LOC~~ SOLN
30.0000 mg | SUBCUTANEOUS | Status: DC
  Administered 2017-01-12 – 2017-01-15 (×3): 30 mg via SUBCUTANEOUS
  Filled 2017-01-12 (×3): qty 0.3

## 2017-01-12 MED ORDER — SODIUM CHLORIDE 0.9 % IV SOLN
INTRAVENOUS | Status: DC
  Administered 2017-01-12: 23:00:00 via INTRAVENOUS

## 2017-01-12 MED ORDER — ACETAMINOPHEN 650 MG RE SUPP: 650.0000 mg | RECTAL | Status: DC | PRN

## 2017-01-12 MED ORDER — ASPIRIN 325 MG PO TABS
325.0000 mg | ORAL_TABLET | Freq: Every day | ORAL | Status: DC
  Administered 2017-01-13 – 2017-01-16 (×4): 325 mg via ORAL
  Filled 2017-01-12 (×5): qty 1

## 2017-01-12 MED ORDER — IOPAMIDOL (ISOVUE-370) INJECTION 76%
INTRAVENOUS | Status: AC
  Administered 2017-01-12: 90 mL
  Filled 2017-01-12: qty 100

## 2017-01-12 MED ORDER — ALBUTEROL SULFATE (2.5 MG/3ML) 0.083% IN NEBU: 3.0000 mL | INHALATION_SOLUTION | Freq: Four times a day (QID) | RESPIRATORY_TRACT | Status: DC | PRN

## 2017-01-12 MED ORDER — ACETAMINOPHEN 325 MG PO TABS
650.0000 mg | ORAL_TABLET | ORAL | Status: DC | PRN
  Administered 2017-01-14 – 2017-01-15 (×2): 650 mg via ORAL
  Filled 2017-01-12 (×2): qty 2

## 2017-01-12 MED ORDER — ACETAMINOPHEN 160 MG/5ML PO SOLN
650.0000 mg | ORAL | Status: DC | PRN
Start: 2017-01-12 — End: 2017-01-16

## 2017-01-12 MED ORDER — MOMETASONE FURO-FORMOTEROL FUM 200-5 MCG/ACT IN AERO
2.0000 | INHALATION_SPRAY | Freq: Two times a day (BID) | RESPIRATORY_TRACT | Status: DC
  Administered 2017-01-12 – 2017-01-16 (×9): 2 via RESPIRATORY_TRACT
  Filled 2017-01-12: qty 8.8

## 2017-01-12 MED ORDER — SIMVASTATIN 20 MG PO TABS
20.0000 mg | ORAL_TABLET | Freq: Every day | ORAL | Status: DC
  Administered 2017-01-12: 20 mg via ORAL
  Filled 2017-01-12: qty 1

## 2017-01-12 MED ORDER — SENNOSIDES-DOCUSATE SODIUM 8.6-50 MG PO TABS: 1.0000 | ORAL_TABLET | Freq: Every evening | ORAL | Status: DC | PRN

## 2017-01-12 MED ORDER — LEVOTHYROXINE SODIUM 25 MCG PO TABS
62.5000 ug | ORAL_TABLET | Freq: Every day | ORAL | Status: DC
  Administered 2017-01-13 – 2017-01-16 (×4): 62.5 ug via ORAL
  Filled 2017-01-12 (×5): qty 1

## 2017-01-12 NOTE — ED Provider Notes (Signed)
[facility] EMERGENCY DEPARTMENT Provider Note   CSN: [id number] Arrival date & time: 01/12/17  1303     History   Chief Complaint No chief complaint on file.  L5 caveat patient with some dementia HPI Joanna [name] is a 83 y.o. female. Primary -[facility] HPI  83 year old female from home via EMS.  EMS reports last known normal last night at 730.  They report they received history from son.  He reported that she began having symptoms of right-sided weakness.  Patient states she is having difficulty moving her right side.  Son had reported to EMS some history of TIAs in the past.  Patient denies anticoagulation use, head injury, or headache.  Past Medical History:  Diagnosis Date  . Allergic rhinitis   . Asthma   . Complication of anesthesia    confusion after 04/2012  . GERD (gastroesophageal reflux disease)   . History of urinary infection   . Hyperlipidemia   . Hypertension   . Hypothyroid   . IBS (irritable bowel syndrome)   . Neuropathy     Patient Active Problem List   Diagnosis Date Noted  . Medication management 09/24/2014  . Urinary incontinence 12/09/2012  . Pain in joint, upper arm 10/30/2012  . Hereditary and idiopathic peripheral neuropathy 08/08/2012  . Extrinsic asthma 07/28/2012  . Pure hypercholesterolemia 07/28/2012  . Iron deficiency anemia, unspecified 06/26/2012  . Unspecified constipation 06/23/2012  . Osteoporosis 05/08/2012  . Closed left hip fracture ([place]) 05/04/2012  . Fall due to stumbling 05/04/2012  . Anemia 05/04/2012  . Bronchiectasis without acute exacerbation ([place]) 04/20/2010  . SINUSITIS, CHRONIC 02/03/2007  . 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 Essential hypertension 01/01/2007  . ALLERGIC RHINITIS 01/01/2007  . Chronic obstructive airway disease with asthma ([place]) 01/01/2007  . GERD 01/01/2007  . Irritable bowel syndrome 01/01/2007  . DIVERTICULOSIS, COLON 10/07/2003    Past Surgical History:    Procedure Laterality Date  . ABDOMINAL HYSTERECTOMY    . BLADDER SURGERY    . CLOSED REDUCTION WRIST FRACTURE Left 05/04/2012   Procedure: CLOSED REDUCTION WRIST;  Surgeon: Mauri Pole, MD;  Location: [facility];  Service: Orthopedics;  Laterality: Left;  with casting  . FOOT SURGERY    . HIP ARTHROPLASTY Left 05/04/2012   Procedure: ARTHROPLASTY BIPOLAR HIP;  Surgeon: Mauri Pole, MD;  Location: [facility];  Service: Orthopedics;  Laterality: Left;  Marland Kitchen MASTECTOMY     double  . NASAL SINUS SURGERY    . OPEN REDUCTION INTERNAL FIXATION (ORIF) DISTAL RADIAL FRACTURE Left 05/29/2012   Procedure: OPEN REDUCTION INTERNAL FIXATION (ORIF) DISTAL RADIAL FRACTURE;  Surgeon: Linna Hoff, MD;  Location: [facility];  Service: Orthopedics;  Laterality: Left;    OB History    No data available       Home Medications    Prior to Admission medications   Medication Sig Start Date End Date Taking? Authorizing Provider  albuterol (PROVENTIL HFA;VENTOLIN HFA) 108 (90 Base) MCG/ACT inhaler Inhale 2 puffs into the lungs every 6 (six) hours as needed for wheezing or shortness of breath. 12/27/16   Reed, Tiffany L, DO  Calcium Carbonate (CALCIUM 600 PO) Take 1 tablet by mouth every morning.    [provider]  cholecalciferol (VITAMIN D) 1000 UNITS tablet Take 1,000 Units by mouth every morning.     [provider]  donepezil (ARICEPT) 10 MG tablet Take 1 tablet (10 mg total) by mouth at bedtime. 12/12/16  Lauree Chandler, NP  gabapentin (NEURONTIN) 300 MG capsule Take 1 capsule (300 mg total) by mouth 2 (two) times daily. 09/25/16   Lauree Chandler, NP  losartan (COZAAR) 25 MG tablet TAKE 1 TABLET BY MOUTH  DAILY 09/10/16   Elayne Snare, MD  magnesium hydroxide (MILK OF MAGNESIA) 400 MG/5ML suspension Take 5 mLs by mouth daily as needed for mild constipation.    [provider]  Memantine HCl ER (NAMENDA XR) 21 MG CP24 Take 1 tablet by mouth daily. 12/12/16   Lauree Chandler, NP   metoprolol succinate (TOPROL-XL) 50 MG 24 hr tablet Take 1 tablet (50 mg total) by mouth daily. Take with or immediately following a meal. 12/27/16   Reed, Tiffany L, DO  mometasone-formoterol (DULERA) 200-5 MCG/ACT AERO Inhale 2 puffs into the lungs 2 (two) times daily. 03/30/15   Collene Gobble, MD  polyethylene glycol (MIRALAX / GLYCOLAX) packet Take 17 g by mouth daily.    [provider]  simvastatin (ZOCOR) 20 MG tablet TAKE 1 TABLET BY MOUTH AT  BEDTIME 09/10/16   Elayne Snare, MD  SYNTHROID 125 MCG tablet TAKE ONE-HALF TABLET BY  MOUTH EVERY MORNING 09/10/16   Elayne Snare, MD    Family History Family History  Problem Relation Age of Onset  . CAD Mother   . Hypertension Father   . Arthritis Sister   . Throat cancer Brother     Social History Social History   Tobacco Use  . Smoking status: Never Smoker  . Smokeless tobacco: Never Used  Substance Use Topics  . Alcohol use: No  . Drug use: No     Allergies   Cefuroxime axetil; Lactose intolerance (gi); Clarithromycin; Codeine; and Sulfonamide derivatives   Review of Systems Review of Systems   Physical Exam Updated Vital Signs There were no vitals taken for this visit.  Physical Exam  Constitutional: She is oriented to person, place, and time. She appears well-developed and well-nourished. No distress.  HENT:  Head: Normocephalic and atraumatic.  Right Ear: External ear normal.  Left Ear: External ear normal.  Nose: Nose normal.  Mouth/Throat: Oropharynx is clear and moist.  Eyes: EOM are normal. Pupils are equal, round, and reactive to light.  Neck: Normal range of motion. Neck supple.  Cardiovascular: Normal rate, regular rhythm and normal heart sounds.  Contusion yellow green right posterior chest wall  Pulmonary/Chest: Effort normal and breath sounds normal.  Abdominal: Soft.  Musculoskeletal: Normal range of motion.  Neurological: She is alert and oriented to person, place, and time.  Mild  facial asymmetry Right arm with decreased ability to raise against gravity Right leg also weak, but less so than arm  Skin: Skin is warm and dry. Capillary refill takes less than 2 seconds.  Multiple contusion on extremities   Psychiatric: She has a normal mood and affect.  Nursing note and vitals reviewed.    ED Treatments / Results  Labs (all labs ordered are listed, but only abnormal results are displayed) Labs Reviewed  PROTIME-INR  APTT  CBC  DIFFERENTIAL  COMPREHENSIVE METABOLIC PANEL  I-STAT TROPONIN, ED  CBG MONITORING, ED  I-STAT CHEM 8, ED    EKG  EKG Interpretation None       Radiology Ct Head Code Stroke Wo Contrast  Result Date: 01/12/2017 CLINICAL DATA:  Code stroke. Code stroke. Right-sided weakness. Right-sided gaze. EXAM: CT HEAD WITHOUT CONTRAST TECHNIQUE: Contiguous axial images were obtained from the base of the skull through the vertex  without intravenous contrast. COMPARISON:  MRI brain 07/20/2010 FINDINGS: Brain: Extensive periventricular and subcortical white matter disease has progressed bilaterally. No acute cortical infarct is present. Basal ganglia are intact bilaterally. Insular ribbon is normal. White matter changes extend into the brainstem. The cerebellum is unremarkable. Vascular: Vascular calcifications are present in the cavernous internal carotid artery is bilaterally and at the dural margin of the right vertebral artery. There is no hyperdense vessel. Skull: The calvarium is intact. Study is mildly degraded by patient motion. Sinuses/Orbits: Bilateral maxillary sinus surgery is noted. Small fluid levels are present in the maxillary sinuses bilaterally. Ethmoidectomies are present. Ostium is present in the inferior left frontal sinus. Minimal fluid is present in the right sphenoid sinus. The mastoid air cells are clear bilaterally. Bilateral lens replacements are present. Globes and orbits are within normal limits. ASPECTS [facility] Stroke Program  Early CT Score) - Ganglionic level infarction (caudate, lentiform nuclei, internal capsule, insula, M1-M3 cortex): 7/7 - Supraganglionic infarction (M4-M6 cortex): 3/3 Total score (0-10 with 10 being normal): 10/10 IMPRESSION: 1. No acute intracranial abnormality 2. Progressive diffuse white matter disease compatible with chronic microvascular ischemia 3. ASPECTS is 10/10 The above was relayed via text pager to Dr. Rory Percy On 01/12/2017 at 13:27 . Electronically Signed   By: San Morelle M.D.   On: 01/12/2017 13:28    Procedures Procedures (including critical care time)  Medications Ordered in ED Medications - No data to display   Initial Impression / Assessment and Plan / ED Course  I have reviewed the triage vital signs and the nursing notes.  Pertinent labs & imaging results that were available during my care of the patient were reviewed by me and considered in my medical decision making (see chart for details).    Patient with new right sided weakness- not tpa candidate per neurology.  Discussed with Dr. Eliseo Squires and she will see for admission.   Final Clinical Impressions(s) / ED Diagnoses   Final diagnoses:  Cerebrovascular accident (CVA), unspecified mechanism [facility])    ED Discharge Orders    None       Pattricia Boss, MD 01/12/17 1524

## 2017-01-12 NOTE — ED Notes (Signed)
Admit Doctor at bedside.  

## 2017-01-12 NOTE — ED Notes (Signed)
Doctor at bedside.

## 2017-01-12 NOTE — H&P (Signed)
History and Physical    Joanna [name] WNU:[id number] DOB: [birth year] DOA: 01/12/2017  Referring MD/NP/PA:  PCP: Lauree Chandler, NP Outpatient Specialists:  Patient coming from: home with son  Chief Complaint: right sided weakness  HPI: Joanna [name] is a 83 y.o. female with medical history significant of dementia, HTN, HLD who lives at home with her son.  She was brought in for evaluation of right sided weakness and some right sided gaze preference.  Per EMS report, son saw her normal last PM around 7:30.  This AM when she got up she had the arm/leg weakness on the right.   No fever, no chills, no dysuria.  Patient denies recent falls but has multiple areas of bruising on body.    In the ER, she was seen as a code stroke.  She was seen by neurology who recommended a CVA work up with MRI/echo.  CT head and CTA were done: Asymmetric attenuation of left MCA branch vessels without a significant proximal stenosis or occlusion.  2. Atherosclerotic calcifications at the left greater than right carotid bifurcation without significant stenosis. She was not a candidate for TPA.   Review of Systems: unable to do full ROS due to dementia   Past Medical History:  Diagnosis Date  . Allergic rhinitis   . Asthma   . Complication of anesthesia    confusion after 04/2012  . GERD (gastroesophageal reflux disease)   . History of urinary infection   . Hyperlipidemia   . Hypertension   . Hypothyroid   . IBS (irritable bowel syndrome)   . Neuropathy     Past Surgical History:  Procedure Laterality Date  . ABDOMINAL HYSTERECTOMY    . BLADDER SURGERY    . CLOSED REDUCTION WRIST FRACTURE Left 05/04/2012   Procedure: CLOSED REDUCTION WRIST;  Surgeon: Mauri Pole, MD;  Location: [facility];  Service: Orthopedics;  Laterality: Left;  with casting  . FOOT SURGERY    . HIP ARTHROPLASTY Left 05/04/2012   Procedure: ARTHROPLASTY BIPOLAR HIP;  Surgeon: Mauri Pole, MD;  Location: [facility];  Service:  Orthopedics;  Laterality: Left;  Marland Kitchen MASTECTOMY     double  . NASAL SINUS SURGERY    . OPEN REDUCTION INTERNAL FIXATION (ORIF) DISTAL RADIAL FRACTURE Left 05/29/2012   Procedure: OPEN REDUCTION INTERNAL FIXATION (ORIF) DISTAL RADIAL FRACTURE;  Surgeon: Linna Hoff, MD;  Location: [facility];  Service: Orthopedics;  Laterality: Left;     reports that  has never smoked. she has never used smokeless tobacco. She reports that she does not drink alcohol or use drugs.  Allergies  Allergen Reactions  . Cefuroxime Axetil Hives    Not sure.. Patient can be allergic to it today but a year from now she may not be.  . Lactose Intolerance (Gi) Other (See Comments)    Gi upset  . Clarithromycin Palpitations  . Codeine Palpitations  . Sulfonamide Derivatives Palpitations    Family History  Problem Relation Age of Onset  . CAD Mother   . Hypertension Father   . Arthritis Sister   . Throat cancer Brother      Prior to Admission medications   Medication Sig Start Date End Date Taking? Authorizing Provider  albuterol (PROVENTIL HFA;VENTOLIN HFA) 108 (90 Base) MCG/ACT inhaler Inhale 2 puffs into the lungs every 6 (six) hours as needed for wheezing or shortness of breath. 12/27/16   Reed, Tiffany L, DO  Calcium Carbonate (CALCIUM 600 PO) Take 1 tablet by  mouth every morning.    [provider]  cholecalciferol (VITAMIN D) 1000 UNITS tablet Take 1,000 Units by mouth every morning.     [provider]  donepezil (ARICEPT) 10 MG tablet Take 1 tablet (10 mg total) by mouth at bedtime. 12/12/16   Lauree Chandler, NP  gabapentin (NEURONTIN) 300 MG capsule Take 1 capsule (300 mg total) by mouth 2 (two) times daily. 09/25/16   Lauree Chandler, NP  losartan (COZAAR) 25 MG tablet TAKE 1 TABLET BY MOUTH  DAILY 09/10/16   Elayne Snare, MD  magnesium hydroxide (MILK OF MAGNESIA) 400 MG/5ML suspension Take 5 mLs by mouth daily as needed for mild constipation.    [provider]    Memantine HCl ER (NAMENDA XR) 21 MG CP24 Take 1 tablet by mouth daily. 12/12/16   Lauree Chandler, NP  metoprolol succinate (TOPROL-XL) 50 MG 24 hr tablet Take 1 tablet (50 mg total) by mouth daily. Take with or immediately following a meal. 12/27/16   Reed, Tiffany L, DO  mometasone-formoterol (DULERA) 200-5 MCG/ACT AERO Inhale 2 puffs into the lungs 2 (two) times daily. 03/30/15   Collene Gobble, MD  polyethylene glycol (MIRALAX / GLYCOLAX) packet Take 17 g by mouth daily.    [provider]  simvastatin (ZOCOR) 20 MG tablet TAKE 1 TABLET BY MOUTH AT  BEDTIME 09/10/16   Elayne Snare, MD  SYNTHROID 125 MCG tablet TAKE ONE-HALF TABLET BY  MOUTH EVERY MORNING 09/10/16   Elayne Snare, MD    Physical Exam: Vitals:   01/12/17 1350 01/12/17 1400 01/12/17 1415 01/12/17 1430  BP:  (!) 189/71 (!) 177/70 (!) 183/77  Pulse:  67 64 (!) 52  Resp:  17 16 15   Temp:      TempSrc:      SpO2:  100% 99% 100%  Weight: 56.7 kg (125 lb 0 oz)         Constitutional: NAD, calm, comfortable- oriented to person, place and time Vitals:   01/12/17 1350 01/12/17 1400 01/12/17 1415 01/12/17 1430  BP:  (!) 189/71 (!) 177/70 (!) 183/77  Pulse:  67 64 (!) 52  Resp:  17 16 15   Temp:      TempSrc:      SpO2:  100% 99% 100%  Weight: 56.7 kg (125 lb 0 oz)      Eyes: PERRL, lids and conjunctivae normal ENMT: Mucous membranes are moist. Posterior pharynx clear of any exudate or lesions.Normal dentition.  Neck: normal, supple, no masses, no thyromegaly Respiratory: no increase work of breathing  Cardiovascular: Regular rate and rhythm, no murmurs / rubs / gallops. No extremity edema. 2+ pedal pulses. No carotid bruits.  Abdomen: no tenderness, no masses palpated. No hepatosplenomegaly. Bowel sounds positive.  Musculoskeletal: no clubbing / cyanosis. No joint deformity upper and lower extremities. Good ROM, no contractures. Normal muscle tone.  Skin: multiple areas of bruising-- right upper arm and  posterior chest wall Neurologic: RUE>RLE weakness, decreased sensation b/l, follows commands Psychiatric: Normal mood    Labs on Admission: I have personally reviewed following labs and imaging studies  CBC: Recent Labs  Lab 01/12/17 1305 01/12/17 1314  WBC 8.7  --   NEUTROABS 5.3  --   HGB 12.2 12.2  HCT 37.8 36.0  MCV 89.2  --   PLT 229  --    Basic Metabolic Panel: Recent Labs  Lab 01/12/17 1305 01/12/17 1314  NA 139 144  K 3.6 3.6  CL 108 109  CO2 23  --   GLUCOSE 104* 101*  BUN 23* 24*  CREATININE 1.04* 1.00  CALCIUM 8.9  --    GFR: Estimated Creatinine Clearance: 36.1 mL/min (by C-G formula based on SCr of 1 mg/dL). Liver Function Tests: Recent Labs  Lab 01/12/17 1305  AST 25  ALT 12*  ALKPHOS 50  BILITOT 0.8  PROT 6.3*  ALBUMIN 3.7   No results for input(s): LIPASE, AMYLASE in the last 168 hours. No results for input(s): AMMONIA in the last 168 hours. Coagulation Profile: Recent Labs  Lab 01/12/17 1305  INR 1.00   Cardiac Enzymes: No results for input(s): CKTOTAL, CKMB, CKMBINDEX, TROPONINI in the last 168 hours. BNP (last 3 results) No results for input(s): PROBNP in the last 8760 hours. HbA1C: No results for input(s): HGBA1C in the last 72 hours. CBG: No results for input(s): GLUCAP in the last 168 hours. Lipid Profile: No results for input(s): CHOL, HDL, LDLCALC, TRIG, CHOLHDL, LDLDIRECT in the last 72 hours. Thyroid Function Tests: No results for input(s): TSH, T4TOTAL, FREET4, T3FREE, THYROIDAB in the last 72 hours. Anemia Panel: No results for input(s): VITAMINB12, FOLATE, FERRITIN, TIBC, IRON, RETICCTPCT in the last 72 hours. Urine analysis:    Component Value Date/Time   COLORURINE YELLOW 02/10/2016 [place] 02/10/2016 1519   LABSPEC 1.006 02/10/2016 [place] 7.0 02/10/2016 1519   GLUCOSEU NEGATIVE 02/10/2016 1519   GLUCOSEU NEGATIVE 04/19/2015 1335   HGBUR NEGATIVE 02/10/2016 1519   BILIRUBINUR  NEGATIVE 02/10/2016 1519   KETONESUR NEGATIVE 02/10/2016 1519   PROTEINUR NEGATIVE 02/10/2016 1519   UROBILINOGEN 0.2 04/19/2015 1335   NITRITE NEGATIVE 02/10/2016 1519   LEUKOCYTESUR NEGATIVE 02/10/2016 1519   Sepsis Labs: Invalid input(s): PROCALCITONIN, LACTICIDVEN No results found for this or any previous visit (from the past 240 hour(s)).   Radiological Exams on Admission: Ct Angio Head W Or Wo Contrast  Result Date: 01/12/2017 CLINICAL DATA:  Code stroke. Right-sided weakness. Right-sided gaze. EXAM: CT ANGIOGRAPHY HEAD AND NECK TECHNIQUE: Multidetector CT imaging of the head and neck was performed using the standard protocol during bolus administration of intravenous contrast. Multiplanar CT image reconstructions and MIPs were obtained to evaluate the vascular anatomy. Carotid stenosis measurements (when applicable) are obtained utilizing NASCET criteria, using the distal internal carotid diameter as the denominator. CONTRAST:  48mL ISOVUE-370 IOPAMIDOL (ISOVUE-370) INJECTION 76% COMPARISON:  CT head without contrast from the same day. CT perfusion from the same day. FINDINGS: CTA NECK FINDINGS Aortic arch: A 3 vessel arch configuration is present. Dense atherosclerotic calcifications are present at the aortic arch without significant stenosis. There are calcifications associated with the origins the great vessels without significant stenosis. Right carotid system: The right common carotid artery demonstrates mild tortuosity. There is no significant stenosis. Right carotid bifurcation is intact. The cervical right ICA is mildly tortuous without significant stenosis. Left carotid system: Calcifications are present along the anterior and lateral wall of the distal left common carotid artery. There are calcifications at the carotid bifurcation. No significant stenosis is present. Moderate tortuosity is present in the cervical left ICA without significant stenosis. Vertebral arteries: The vertebral  artery is originate from the subclavian artery is bilaterally without significant stenosis. There is calcification of the origins of both vertebral arteries. Left vertebral artery is dominant. No focal stenosis is present in either vertebral artery in the neck. Skeleton: Levoconvex curvature is present in the upper thoracic spine. Multilevel endplate changes are present from C3-4 through the cervicothoracic junction.  Vertebral body heights are maintained. Facet hypertrophy contributes to multilevel foraminal narrowing. No focal lytic or blastic lesions are present. Other neck: Heterogeneous thyroid is present with multiple subcentimeter nodules in inferior extension on the left. No significant adenopathy is present. Salivary glands are within normal limits. Upper chest: Mild dependent atelectasis is present in the upper lobes bilaterally. No focal nodule, mass, or airspace disease is present. The thoracic inlet is within limits. Superior mediastinum is otherwise unremarkable. Review of the MIP images confirms the above findings CTA HEAD FINDINGS Anterior circulation: Atherosclerotic calcifications are present within the cavernous internal carotid artery is bilaterally without significant stenosis relative to the more distal vessels. The ICA termini are within normal limits. The right A1 segment is hypoplastic compared to the left. The anterior communicating artery is patent. The left A1 segment above the M1 segments are normal. The MCA bifurcations are intact. There is asymmetric attenuation of MCA branch vessels on the left without a focal stenosis or occlusion. The bilateral ACA and right MCA branch vessels are within normal limits. Posterior circulation: Left vertebral artery is the dominant vessel. PICA origins are visualized and normal bilaterally. Basilar artery is tortuous. A fetal type right posterior cerebral artery is present. The left posterior cerebral artery originates from a posterior communicating  artery and a left P1 segment. The PCA branch vessels demonstrate mild distal irregularity without a significant proximal stenosis or occlusion. Venous sinuses: The dural sinuses are patent. Left transverse sinus is patent. Straight sinus deep cerebral veins are intact. Cortical veins are unremarkable. Anatomic variants: Fetal type right posterior cerebral artery. Delayed phase: Not performed. Review of the MIP images confirms the above findings IMPRESSION: 1. Asymmetric attenuation of left MCA branch vessels without a significant proximal stenosis or occlusion. 2. Atherosclerotic calcifications at the left greater than right carotid bifurcation without significant stenosis. 3.  Aortic Atherosclerosis (ICD10-I70.0). 4. Mild distal small vessel disease without a significant proximal stenosis, aneurysm, or branch vessel occlusion within the circle of Willis. These results were called by telephone at the time of interpretation on 01/12/2017 at 1:39 PM to Dr. Amie Portland , who verbally acknowledged these results. Electronically Signed   By: San Morelle M.D.   On: 01/12/2017 14:05   Ct Angio Neck W Or Wo Contrast  Result Date: 01/12/2017 CLINICAL DATA:  Code stroke. Right-sided weakness. Right-sided gaze. EXAM: CT ANGIOGRAPHY HEAD AND NECK TECHNIQUE: Multidetector CT imaging of the head and neck was performed using the standard protocol during bolus administration of intravenous contrast. Multiplanar CT image reconstructions and MIPs were obtained to evaluate the vascular anatomy. Carotid stenosis measurements (when applicable) are obtained utilizing NASCET criteria, using the distal internal carotid diameter as the denominator. CONTRAST:  3mL ISOVUE-370 IOPAMIDOL (ISOVUE-370) INJECTION 76% COMPARISON:  CT head without contrast from the same day. CT perfusion from the same day. FINDINGS: CTA NECK FINDINGS Aortic arch: A 3 vessel arch configuration is present. Dense atherosclerotic calcifications are  present at the aortic arch without significant stenosis. There are calcifications associated with the origins the great vessels without significant stenosis. Right carotid system: The right common carotid artery demonstrates mild tortuosity. There is no significant stenosis. Right carotid bifurcation is intact. The cervical right ICA is mildly tortuous without significant stenosis. Left carotid system: Calcifications are present along the anterior and lateral wall of the distal left common carotid artery. There are calcifications at the carotid bifurcation. No significant stenosis is present. Moderate tortuosity is present in the cervical left ICA without significant stenosis.  Vertebral arteries: The vertebral artery is originate from the subclavian artery is bilaterally without significant stenosis. There is calcification of the origins of both vertebral arteries. Left vertebral artery is dominant. No focal stenosis is present in either vertebral artery in the neck. Skeleton: Levoconvex curvature is present in the upper thoracic spine. Multilevel endplate changes are present from C3-4 through the cervicothoracic junction. Vertebral body heights are maintained. Facet hypertrophy contributes to multilevel foraminal narrowing. No focal lytic or blastic lesions are present. Other neck: Heterogeneous thyroid is present with multiple subcentimeter nodules in inferior extension on the left. No significant adenopathy is present. Salivary glands are within normal limits. Upper chest: Mild dependent atelectasis is present in the upper lobes bilaterally. No focal nodule, mass, or airspace disease is present. The thoracic inlet is within limits. Superior mediastinum is otherwise unremarkable. Review of the MIP images confirms the above findings CTA HEAD FINDINGS Anterior circulation: Atherosclerotic calcifications are present within the cavernous internal carotid artery is bilaterally without significant stenosis relative to  the more distal vessels. The ICA termini are within normal limits. The right A1 segment is hypoplastic compared to the left. The anterior communicating artery is patent. The left A1 segment above the M1 segments are normal. The MCA bifurcations are intact. There is asymmetric attenuation of MCA branch vessels on the left without a focal stenosis or occlusion. The bilateral ACA and right MCA branch vessels are within normal limits. Posterior circulation: Left vertebral artery is the dominant vessel. PICA origins are visualized and normal bilaterally. Basilar artery is tortuous. A fetal type right posterior cerebral artery is present. The left posterior cerebral artery originates from a posterior communicating artery and a left P1 segment. The PCA branch vessels demonstrate mild distal irregularity without a significant proximal stenosis or occlusion. Venous sinuses: The dural sinuses are patent. Left transverse sinus is patent. Straight sinus deep cerebral veins are intact. Cortical veins are unremarkable. Anatomic variants: Fetal type right posterior cerebral artery. Delayed phase: Not performed. Review of the MIP images confirms the above findings IMPRESSION: 1. Asymmetric attenuation of left MCA branch vessels without a significant proximal stenosis or occlusion. 2. Atherosclerotic calcifications at the left greater than right carotid bifurcation without significant stenosis. 3.  Aortic Atherosclerosis (ICD10-I70.0). 4. Mild distal small vessel disease without a significant proximal stenosis, aneurysm, or branch vessel occlusion within the circle of Willis. These results were called by telephone at the time of interpretation on 01/12/2017 at 1:39 PM to Dr. Amie Portland , who verbally acknowledged these results. Electronically Signed   By: San Morelle M.D.   On: 01/12/2017 14:05   Ct Cerebral Perfusion W Contrast  Result Date: 01/12/2017 CLINICAL DATA:  Right-sided weakness.  Right-sided gaze. EXAM:  CT PERFUSION BRAIN TECHNIQUE: Multiphase CT imaging of the brain was performed following IV bolus contrast injection. Subsequent parametric perfusion maps were calculated using RAPID software. CONTRAST:  39mL ISOVUE-370 IOPAMIDOL (ISOVUE-370) INJECTION 76% COMPARISON:  CT head without contrast from the same day. FINDINGS: CT Brain Perfusion Findings: CBF (<30%) Volume: 38mL Perfusion (Tmax>6.0s) volume: 13mL Mismatch Volume: 10mL Infarction Location:Posterior left frontal lobe along the primary motor cortex IMPRESSION: Area of focal ischemia along the posterior left frontal lobe at the primary motor cortex without evidence for infarct. Estimated volume is 4 mL. These results were called by telephone at the time of interpretation on 01/12/2017 at 1:39 pm to Dr. Amie Portland , who verbally acknowledged these results. Electronically Signed   By: San Morelle M.D.   On:  01/12/2017 13:54   Ct Head Code Stroke Wo Contrast  Result Date: 01/12/2017 CLINICAL DATA:  Code stroke. Code stroke. Right-sided weakness. Right-sided gaze. EXAM: CT HEAD WITHOUT CONTRAST TECHNIQUE: Contiguous axial images were obtained from the base of the skull through the vertex without intravenous contrast. COMPARISON:  MRI brain 07/20/2010 FINDINGS: Brain: Extensive periventricular and subcortical white matter disease has progressed bilaterally. No acute cortical infarct is present. Basal ganglia are intact bilaterally. Insular ribbon is normal. White matter changes extend into the brainstem. The cerebellum is unremarkable. Vascular: Vascular calcifications are present in the cavernous internal carotid artery is bilaterally and at the dural margin of the right vertebral artery. There is no hyperdense vessel. Skull: The calvarium is intact. Study is mildly degraded by patient motion. Sinuses/Orbits: Bilateral maxillary sinus surgery is noted. Small fluid levels are present in the maxillary sinuses bilaterally. Ethmoidectomies are present.  Ostium is present in the inferior left frontal sinus. Minimal fluid is present in the right sphenoid sinus. The mastoid air cells are clear bilaterally. Bilateral lens replacements are present. Globes and orbits are within normal limits. ASPECTS [facility] Stroke Program Early CT Score) - Ganglionic level infarction (caudate, lentiform nuclei, internal capsule, insula, M1-M3 cortex): 7/7 - Supraganglionic infarction (M4-M6 cortex): 3/3 Total score (0-10 with 10 being normal): 10/10 IMPRESSION: 1. No acute intracranial abnormality 2. Progressive diffuse white matter disease compatible with chronic microvascular ischemia 3. ASPECTS is 10/10 The above was relayed via text pager to Dr. Rory Percy On 01/12/2017 at 13:27 . Electronically Signed   By: San Morelle M.D.   On: 01/12/2017 13:28    EKG: Independently reviewed. Sinus with shortened PR interval  Assessment/Plan Active Problems:   Essential hypertension   Chronic obstructive airway disease with asthma (HCC)   Pure hypercholesterolemia   CVA (cerebral vascular accident) ([place])   Dementia   CVA vs TIA -MRI/MRA ordered -echo -FLP -HgbA1c -ASA 325 mg -statin -PT/OT/speech Neuro checks -permissive HTN up to 220 SBP  HTN -resume home meds but will avoid hypotension  HLD -continue home meds -check FLP  Dementia -continue namenda   DVT prophylaxis: lovenox Code Status: full Family Communication: none at bedside Disposition Plan: inpt tele Consults called: neuro    Geradine Girt DO [facility] Pager [phone]  If 7PM-7AM, please contact night-coverage [email or web address] Password [facility]  01/12/2017, 2:49 PM

## 2017-01-12 NOTE — ED Triage Notes (Signed)
Pt presents for evaluation of R sided weakness and R sided gaze. LKW 1930 last night. Pt only with R sided weakness, minor R sided facial droop, and some sensory deficits on arrival. No gaze or visual deficits noted.

## 2017-01-12 NOTE — Consult Note (Signed)
Neurology Consultation  Reason for Consult: Dr Jeanell Sparrow Referring Physician: code stroke  CC: RSW, ?gaze  History is obtained from: EMS  HPI: Joanna [name] is a 83 y.o. female PMH of dementia, TIA, HTN, HLD, presenting from home when she was noted to have Right sided weakness and some gaze preference (right sided per EMS). Her son last saw her normal at 1930 hrs on 01/11/17. She was seen again this morning but now had weakness on the right arm and leg. No slurred speech. No difficulty with words. No headache. No nausea, vomiting. She denies previous strokes. Reports she gets bruises easily but no bleeds. No preceding GI or respiratory flu like symptoms  Patient did not seem to be a very reliable historian, possibly secondary to underlying dementia. No family was available at the bedside at the time of encounter.   LKW: 1930 01-11-17 tpa given?: no, OSW Premorbid modified Rankin scale (mRS):2  Review of Systems  Constitutional: Negative for chills and fever.  HENT: Negative for ear pain, hearing loss and tinnitus.   Eyes: Negative for blurred vision and double vision.  Respiratory: Negative for cough and hemoptysis.   Cardiovascular: Negative for chest pain and palpitations.  Gastrointestinal: Negative for heartburn, nausea and vomiting.  Genitourinary: Negative for dysuria and urgency.  Musculoskeletal: Negative for myalgias and neck pain.  Skin: Negative for itching and rash.  Neurological: Positive for focal weakness. Negative for dizziness, tingling, tremors, sensory change, speech change, seizures, loss of consciousness and headaches.  Endo/Heme/Allergies: Negative for environmental allergies and polydipsia. Bruises/bleeds easily.  Psychiatric/Behavioral: Positive for depression and memory loss.   Past Medical History:  Diagnosis Date  . Allergic rhinitis   . Asthma   . Complication of anesthesia    confusion after 04/2012  . GERD (gastroesophageal reflux disease)   .  History of urinary infection   . Hyperlipidemia   . Hypertension   . Hypothyroid   . IBS (irritable bowel syndrome)   . Neuropathy     Family History  Problem Relation Age of Onset  . CAD Mother   . Hypertension Father   . Arthritis Sister   . Throat cancer Brother    Social History:   reports that  has never smoked. she has never used smokeless tobacco. She reports that she does not drink alcohol or use drugs.  Medications  Current Facility-Administered Medications:  .  [COMPLETED] iopamidol (ISOVUE-370) 76 % injection, , , , , 90 mL at 01/12/17 1330  Current Outpatient Medications:  .  albuterol (PROVENTIL HFA;VENTOLIN HFA) 108 (90 Base) MCG/ACT inhaler, Inhale 2 puffs into the lungs every 6 (six) hours as needed for wheezing or shortness of breath., Disp: 1 Inhaler, Rfl: 5 .  Calcium Carbonate (CALCIUM 600 PO), Take 1 tablet by mouth every morning., Disp: , Rfl:  .  cholecalciferol (VITAMIN D) 1000 UNITS tablet, Take 1,000 Units by mouth every morning. , Disp: , Rfl:  .  donepezil (ARICEPT) 10 MG tablet, Take 1 tablet (10 mg total) by mouth at bedtime., Disp: 30 tablet, Rfl: 3 .  gabapentin (NEURONTIN) 300 MG capsule, Take 1 capsule (300 mg total) by mouth 2 (two) times daily., Disp: 60 capsule, Rfl: 2 .  losartan (COZAAR) 25 MG tablet, TAKE 1 TABLET BY MOUTH  DAILY, Disp: 90 tablet, Rfl: 1 .  magnesium hydroxide (MILK OF MAGNESIA) 400 MG/5ML suspension, Take 5 mLs by mouth daily as needed for mild constipation., Disp: , Rfl:  .  Memantine HCl ER (NAMENDA XR)  21 MG CP24, Take 1 tablet by mouth daily., Disp: 30 capsule, Rfl: 3 .  metoprolol succinate (TOPROL-XL) 50 MG 24 hr tablet, Take 1 tablet (50 mg total) by mouth daily. Take with or immediately following a meal., Disp: 90 tablet, Rfl: 3 .  mometasone-formoterol (DULERA) 200-5 MCG/ACT AERO, Inhale 2 puffs into the lungs 2 (two) times daily., Disp: 3 Inhaler, Rfl: 1 .  polyethylene glycol (MIRALAX / GLYCOLAX) packet, Take 17 g  by mouth daily., Disp: , Rfl:  .  simvastatin (ZOCOR) 20 MG tablet, TAKE 1 TABLET BY MOUTH AT  BEDTIME, Disp: 90 tablet, Rfl: 1 .  SYNTHROID 125 MCG tablet, TAKE ONE-HALF TABLET BY  MOUTH EVERY MORNING, Disp: 45 tablet, Rfl: 1  Exam: Current vital signs: BP (!) 186/70   Pulse 68   Temp 99.2 F (37.3 C) (Temporal)   Resp 14   Wt 56.7 kg (125 lb 0 oz)   SpO2 100%   BMI 19.01 kg/m  Vital signs in last 24 hours:   GENERAL: Awake, alert in NAD HEENT: - Normocephalic and atraumatic, dry mm, no LN++, no Thyromegally LUNGS - Clear to auscultation bilaterally with no wheezes CV - S1S2 RRR, no m/r/g, equal pulses bilaterally. ABDOMEN - Soft, nontender, nondistended with normoactive BS Ext: warm, well perfused, intact peripheral pulses, no edema NEURO:  Mental Status: AA& oriented to self only Language: speech is clear.  Naming, repetition, fluency, and comprehension intact. Cranial Nerves: PERRL. EOMI, visual fields full, subtle right facial droop, facial sensation intact, hearing diminished b/l, tongue/uvula/soft palate midline, normal sternocleidomastoid and trapezius muscle strength. No evidence of tongue atrophy or fibrillations Motor: RUE 4/5 +drift , LUE 5/5, RLE 4+/5 no drift, LLE 5/5.  Normal tone normal range of motion Sensation-inconsistent exam.  On the face had right facial decreased sensation to light touch and on the lower extremity-left leg decreased sensation. Coordination: FTN intact bilaterally, no ataxia in BLE. Gait- deferred  NIHSS 1a Level of Conscious.: 0 1b LOC Questions: 2 1c LOC Commands: 0 2 Best Gaze: 0 3 Visual: 0 4 Facial Palsy: 1 5a Motor Arm - left: 0 5b Motor Arm - Right: 1 6a Motor Leg - Left: 0 6b Motor Leg - Right: 0 7 Limb Ataxia: 0 8 Sensory: 1 9 Best Language: 0 10 Dysarthria: 0 11 Extinct. and Inatten.: 0 TOTAL: 5  Labs I have reviewed labs in epic and the results pertinent to this consultation are:  CBC    Component Value Date/Time    WBC 8.7 01/12/2017 1305   RBC 4.24 01/12/2017 1305   HGB 12.2 01/12/2017 1314   HCT 36.0 01/12/2017 1314   PLT 229 01/12/2017 1305   MCV 89.2 01/12/2017 1305   MCH 28.8 01/12/2017 1305   MCHC 32.3 01/12/2017 1305   RDW 14.3 01/12/2017 1305   LYMPHSABS 2.7 01/12/2017 1305   MONOABS 0.5 01/12/2017 1305   EOSABS 0.1 01/12/2017 1305   BASOSABS 0.0 01/12/2017 1305    CMP     Component Value Date/Time   NA 144 01/12/2017 1314   K 3.6 01/12/2017 1314   CL 109 01/12/2017 1314   CO2 25 08/28/2016 1000   GLUCOSE 101 (H) 01/12/2017 1314   BUN 24 (H) 01/12/2017 1314   CREATININE 1.00 01/12/2017 1314   CREATININE 1.01 (H) 08/28/2016 1000   CALCIUM 9.2 08/28/2016 1000   PROT 6.5 08/28/2016 1000   ALBUMIN 4.1 08/28/2016 1000   AST 24 08/28/2016 1000   ALT 10 08/28/2016 1000  ALKPHOS 52 08/28/2016 1000   BILITOT 0.7 08/28/2016 1000   GFRNONAA 51 (L) 08/28/2016 1000   GFRAA 59 (L) 08/28/2016 1000    Lipid Panel     Component Value Date/Time   CHOL 214 (H) 08/28/2016 1000   TRIG 80 08/28/2016 1000   HDL 76 08/28/2016 1000   CHOLHDL 2.8 08/28/2016 1000   VLDL 16 08/28/2016 1000   LDLCALC 122 (H) 08/28/2016 1000   LDLDIRECT 93.8 11/05/2012 1332    Imaging I have reviewed the images obtained: CT scan of the brain:no acute changes, some atrophy and chronic lacunars CTA: No proximal large vessel occlusion.  Asymmetric attenuation of left MCA branches without significant proximal stenosis or occlusion.  Atherosclerotic calcifications-left greater than right carotid bifurcation without significant stenosis.  Aortic atherosclerosis. CT perfusion: Area of penumbra along left frontal lobe in the primary motor cortex with no core.  4 cc volume.  Assessment:  Joanna [name] is a 83 year old woman with dementia, history of TIAs, hypertension hyperlipidemia who came from home for right-sided weakness and possibly right gaze preference noted by EMS. On my exam and her extremity weakness more  than right lower extremity weakness and some mild right lower facial weakness. Sensory exam was not reliable and was significant for decreased sensation on the right face and left leg.  Intact sensation on the arm. CT perfusion study showed an area of penumbra along the left frontal lobe around the primary motor cortex. CT head showed no bleed or evidence of acute infarct. CT angiogram did not show a large vessel occlusion. Her last known normal was past the 4-1/2-hour window for IV TPA, as she is not a candidate for IV TPA. No large vessel occlusion on CTA precluded her from any endovascular procedure.  Impression Most likely cardio embolic stroke involving distal left MCA branch. h/o TIA H/o Dementia  Recommendations: -Admit to hospitalist -Telemetry monitoring -Allow for permissive hypertension for the first 24-48h - only treat PRN if SBP >220 mmHg. Blood pressures can be gradually normalized to SBP<140 upon discharge. -MRI brain without contrast -Echocardiogram -HgbA1c, fasting lipid panel -Frequent neuro checks -Prophylactic therapy-Antiplatelet med: Aspirin - dose 325mg  PO or 300mg  PR -Atorvastatin 80 mg PO daily -Risk factor modification -PT consult, OT consult, Speech consult -c/w home [facility] while inpatient  Please page stroke NP/PA/MD (listed on AMION)  from 8am-4 pm as this patient will be followed by the stroke team at this point.  -- Amie Portland, MD [facility] [phone] If 7pm to 7am, please call on call as listed on AMION.  CRITICAL CARE Performed by: Amie Portland Total critical care time: 40 minutes Critical care time was exclusive of separately billable procedures and treating other patients. Critical care was necessary to treat or prevent imminent or life-threatening deterioration. Critical care was time spent personally by me on the following activities: development of treatment plan with patient and/or surrogate as well as nursing,  discussions with consultants, evaluation of patient's response to treatment, examination of patient, obtaining history from patient or surrogate, ordering and performing treatments and interventions, ordering and review of laboratory studies, ordering and review of radiographic studies, pulse oximetry and re-evaluation of patient's condition.

## 2017-01-12 NOTE — ED Notes (Signed)
Pt placed on bedpan to have BM °

## 2017-01-12 NOTE — ED Notes (Signed)
CBG 89 

## 2017-01-13 DIAGNOSIS — J449 Chronic obstructive pulmonary disease, unspecified: Secondary | ICD-10-CM

## 2017-01-13 LAB — LIPID PANEL
CHOL/HDL RATIO: 2.9 ratio
CHOLESTEROL: 184 mg/dL (ref 0–200)
HDL: 64 mg/dL (ref >40)
LDL Cholesterol: 99 mg/dL (ref 0–99)
Triglycerides: 106 mg/dL (ref <150)
VLDL: 21 mg/dL (ref 0–40)

## 2017-01-13 LAB — HEMOGLOBIN A1C
Hgb A1c MFr Bld: 5.5 % (ref 4.8–5.6)
MEAN PLASMA GLUCOSE: 111.15 mg/dL

## 2017-01-13 MED ORDER — DONEPEZIL HCL 10 MG PO TABS
10.0000 mg | ORAL_TABLET | Freq: Every day | ORAL | Status: DC
  Administered 2017-01-14 – 2017-01-15 (×2): 10 mg via ORAL
  Filled 2017-01-13 (×2): qty 1

## 2017-01-13 MED ORDER — LOSARTAN POTASSIUM 50 MG PO TABS
25.0000 mg | ORAL_TABLET | Freq: Every day | ORAL | Status: DC
  Administered 2017-01-13 – 2017-01-16 (×4): 25 mg via ORAL
  Filled 2017-01-13 (×4): qty 1

## 2017-01-13 MED ORDER — SIMVASTATIN 40 MG PO TABS
40.0000 mg | ORAL_TABLET | Freq: Every day | ORAL | Status: DC
  Administered 2017-01-14 – 2017-01-15 (×2): 40 mg via ORAL
  Filled 2017-01-13 (×2): qty 1

## 2017-01-13 MED ORDER — GABAPENTIN 300 MG PO CAPS
300.0000 mg | ORAL_CAPSULE | Freq: Two times a day (BID) | ORAL | Status: DC
  Administered 2017-01-13 – 2017-01-16 (×6): 300 mg via ORAL
  Filled 2017-01-13 (×6): qty 1

## 2017-01-13 NOTE — Progress Notes (Signed)
PROGRESS NOTE    Joanna [name]  NIO:[id number] DOB: 10-14-1928 DOA: 01/12/2017 PCP: Joanna Chandler, NP   Chief Complaint  Patient presents with  . Code Stroke    Brief Narrative:  83 y.o. female with medical history significant of dementia, HTN, HLD who lives at home with her son.  She was brought in for evaluation of right sided weakness and some right sided gaze preference. Admitted for CVA.  Assessment & Plan   Acute CVA -Presented with right-sided weakness -CT head showed no acute intracranial abnormality -CTA Head: Asymmetric attenuation of left MCA branch vessels without a significant proximal stenosis or occlusion -MRI brain: Small acute infarct in the left posterior insula with associated hemorrhage. Acute infarct in the left parietal white matter Atrophy and moderate chronic ischemic changes -Echocardiogram pending -LDL 99, hemoglobin A1c 5.5 -Neurology consulted and appreciated -PT/OT recommended SNF -Will consult social work -Continue aspirin and statin  Essential hypertension -Allow for permissive hypertension given acute CVA -Patient currently on metoprolol and losartan  Hyperlipidemia -lipid panel: TC 24, HDL 64, LDL 99, triglycerides 106 -Continue statin  Dementia -Continue Namenda, Aricept  Hypothyroidism -Continue Synthroid  DVT Prophylaxis  Lovenox  Code Status: Full  Family Communication: None at bedside  Disposition Plan: admitted, pending workup and further recommendations from neurology, will need SNF at discharge  Consultants Neurology  Procedures  None  Antibiotics   Anti-infectives (From admission, onward)   None      Subjective:   Joanna [name] seen and examined today.  Complains of pain in her left foot. States it "draws up."  Has dementia.  Objective:   Vitals:   01/13/17 0100 01/13/17 0559 01/13/17 0834 01/13/17 1033  BP: (!) 174/76 (!) 189/75  (!) 159/60  Pulse: 75 74  81  Resp: 16 16  18   Temp: 98.8 F (37.1  C) 97.9 F (36.6 C)  98.7 F (37.1 C)  TempSrc: Oral Oral  Oral  SpO2: 96% 97% 97% 98%  Weight:       No intake or output data in the 24 hours ending 01/13/17 1054 Filed Weights   01/12/17 1350  Weight: 56.7 kg (125 lb 0 oz)    Exam  General: Well developed, elderly, NAD  HEENT: NCAT, PERRLA, EOMI, Anicteic Sclera, mucous membranes moist.   Cardiovascular: S1 S2 auscultated, no rubs, murmurs or gallops. Regular rate and rhythm.  Respiratory: Clear to auscultation bilaterally with equal chest rise  Abdomen: Soft, nontender, nondistended, + bowel sounds  Extremities: warm dry without cyanosis clubbing or edema  Neuro: AAOx1 (self only), with dementia. RUE weakness, RLE weakness  Skin: Without rashes exudates or nodules, multiple areas of bruising  Psych: appropriate, confused   Data Reviewed: I have personally reviewed following labs and imaging studies  CBC: Recent Labs  Lab 01/12/17 1305 01/12/17 1314  WBC 8.7  --   NEUTROABS 5.3  --   HGB 12.2 12.2  HCT 37.8 36.0  MCV 89.2  --   PLT 229  --    Basic Metabolic Panel: Recent Labs  Lab 01/12/17 1305 01/12/17 1314  NA 139 144  K 3.6 3.6  CL 108 109  CO2 23  --   GLUCOSE 104* 101*  BUN 23* 24*  CREATININE 1.04* 1.00  CALCIUM 8.9  --    GFR: Estimated Creatinine Clearance: 36.1 mL/min (by C-G formula based on SCr of 1 mg/dL). Liver Function Tests: Recent Labs  Lab 01/12/17 1305  AST 25  ALT 12*  ALKPHOS  50  BILITOT 0.8  PROT 6.3*  ALBUMIN 3.7   No results for input(s): LIPASE, AMYLASE in the last 168 hours. No results for input(s): AMMONIA in the last 168 hours. Coagulation Profile: Recent Labs  Lab 01/12/17 1305  INR 1.00   Cardiac Enzymes: No results for input(s): CKTOTAL, CKMB, CKMBINDEX, TROPONINI in the last 168 hours. BNP (last 3 results) No results for input(s): PROBNP in the last 8760 hours. HbA1C: Recent Labs    01/13/17 0433  HGBA1C 5.5   CBG: Recent Labs  Lab  01/12/17 1558  GLUCAP 89   Lipid Profile: Recent Labs    01/13/17 0433  CHOL 184  HDL 64  LDLCALC 99  TRIG 106  CHOLHDL 2.9   Thyroid Function Tests: No results for input(s): TSH, T4TOTAL, FREET4, T3FREE, THYROIDAB in the last 72 hours. Anemia Panel: No results for input(s): VITAMINB12, FOLATE, FERRITIN, TIBC, IRON, RETICCTPCT in the last 72 hours. Urine analysis:    Component Value Date/Time   COLORURINE YELLOW 02/10/2016 [place] 02/10/2016 1519   LABSPEC 1.006 02/10/2016 1519   PHURINE 7.0 02/10/2016 1519   GLUCOSEU NEGATIVE 02/10/2016 1519   GLUCOSEU NEGATIVE 04/19/2015 1335   HGBUR NEGATIVE 02/10/2016 1519   BILIRUBINUR NEGATIVE 02/10/2016 1519   KETONESUR NEGATIVE 02/10/2016 1519   PROTEINUR NEGATIVE 02/10/2016 1519   UROBILINOGEN 0.2 04/19/2015 1335   NITRITE NEGATIVE 02/10/2016 1519   LEUKOCYTESUR NEGATIVE 02/10/2016 1519   Sepsis Labs: @LABRCNTIP (procalcitonin:4,lacticidven:4)  )No results found for this or any previous visit (from the past 240 hour(s)).    Radiology Studies: Ct Angio Head W Or Wo Contrast  Result Date: 01/12/2017 CLINICAL DATA:  Code stroke. Right-sided weakness. Right-sided gaze. EXAM: CT ANGIOGRAPHY HEAD AND NECK TECHNIQUE: Multidetector CT imaging of the head and neck was performed using the standard protocol during bolus administration of intravenous contrast. Multiplanar CT image reconstructions and MIPs were obtained to evaluate the vascular anatomy. Carotid stenosis measurements (when applicable) are obtained utilizing NASCET criteria, using the distal internal carotid diameter as the denominator. CONTRAST:  45mL ISOVUE-370 IOPAMIDOL (ISOVUE-370) INJECTION 76% COMPARISON:  CT head without contrast from the same day. CT perfusion from the same day. FINDINGS: CTA NECK FINDINGS Aortic arch: A 3 vessel arch configuration is present. Dense atherosclerotic calcifications are present at the aortic arch without significant  stenosis. There are calcifications associated with the origins the great vessels without significant stenosis. Right carotid system: The right common carotid artery demonstrates mild tortuosity. There is no significant stenosis. Right carotid bifurcation is intact. The cervical right ICA is mildly tortuous without significant stenosis. Left carotid system: Calcifications are present along the anterior and lateral wall of the distal left common carotid artery. There are calcifications at the carotid bifurcation. No significant stenosis is present. Moderate tortuosity is present in the cervical left ICA without significant stenosis. Vertebral arteries: The vertebral artery is originate from the subclavian artery is bilaterally without significant stenosis. There is calcification of the origins of both vertebral arteries. Left vertebral artery is dominant. No focal stenosis is present in either vertebral artery in the neck. Skeleton: Levoconvex curvature is present in the upper thoracic spine. Multilevel endplate changes are present from C3-4 through the cervicothoracic junction. Vertebral body heights are maintained. Facet hypertrophy contributes to multilevel foraminal narrowing. No focal lytic or blastic lesions are present. Other neck: Heterogeneous thyroid is present with multiple subcentimeter nodules in inferior extension on the left. No significant adenopathy is present. Salivary glands are within normal limits. Upper  chest: Mild dependent atelectasis is present in the upper lobes bilaterally. No focal nodule, mass, or airspace disease is present. The thoracic inlet is within limits. Superior mediastinum is otherwise unremarkable. Review of the MIP images confirms the above findings CTA HEAD FINDINGS Anterior circulation: Atherosclerotic calcifications are present within the cavernous internal carotid artery is bilaterally without significant stenosis relative to the more distal vessels. The ICA termini are  within normal limits. The right A1 segment is hypoplastic compared to the left. The anterior communicating artery is patent. The left A1 segment above the M1 segments are normal. The MCA bifurcations are intact. There is asymmetric attenuation of MCA branch vessels on the left without a focal stenosis or occlusion. The bilateral ACA and right MCA branch vessels are within normal limits. Posterior circulation: Left vertebral artery is the dominant vessel. PICA origins are visualized and normal bilaterally. Basilar artery is tortuous. A fetal type right posterior cerebral artery is present. The left posterior cerebral artery originates from a posterior communicating artery and a left P1 segment. The PCA branch vessels demonstrate mild distal irregularity without a significant proximal stenosis or occlusion. Venous sinuses: The dural sinuses are patent. Left transverse sinus is patent. Straight sinus deep cerebral veins are intact. Cortical veins are unremarkable. Anatomic variants: Fetal type right posterior cerebral artery. Delayed phase: Not performed. Review of the MIP images confirms the above findings IMPRESSION: 1. Asymmetric attenuation of left MCA branch vessels without a significant proximal stenosis or occlusion. 2. Atherosclerotic calcifications at the left greater than right carotid bifurcation without significant stenosis. 3.  Aortic Atherosclerosis (ICD10-I70.0). 4. Mild distal small vessel disease without a significant proximal stenosis, aneurysm, or branch vessel occlusion within the circle of Willis. These results were called by telephone at the time of interpretation on 01/12/2017 at 1:39 PM to Dr. Amie Portland , who verbally acknowledged these results. Electronically Signed   By: San Morelle M.D.   On: 01/12/2017 14:05   Ct Angio Neck W Or Wo Contrast  Result Date: 01/12/2017 CLINICAL DATA:  Code stroke. Right-sided weakness. Right-sided gaze. EXAM: CT ANGIOGRAPHY HEAD AND NECK  TECHNIQUE: Multidetector CT imaging of the head and neck was performed using the standard protocol during bolus administration of intravenous contrast. Multiplanar CT image reconstructions and MIPs were obtained to evaluate the vascular anatomy. Carotid stenosis measurements (when applicable) are obtained utilizing NASCET criteria, using the distal internal carotid diameter as the denominator. CONTRAST:  47mL ISOVUE-370 IOPAMIDOL (ISOVUE-370) INJECTION 76% COMPARISON:  CT head without contrast from the same day. CT perfusion from the same day. FINDINGS: CTA NECK FINDINGS Aortic arch: A 3 vessel arch configuration is present. Dense atherosclerotic calcifications are present at the aortic arch without significant stenosis. There are calcifications associated with the origins the great vessels without significant stenosis. Right carotid system: The right common carotid artery demonstrates mild tortuosity. There is no significant stenosis. Right carotid bifurcation is intact. The cervical right ICA is mildly tortuous without significant stenosis. Left carotid system: Calcifications are present along the anterior and lateral wall of the distal left common carotid artery. There are calcifications at the carotid bifurcation. No significant stenosis is present. Moderate tortuosity is present in the cervical left ICA without significant stenosis. Vertebral arteries: The vertebral artery is originate from the subclavian artery is bilaterally without significant stenosis. There is calcification of the origins of both vertebral arteries. Left vertebral artery is dominant. No focal stenosis is present in either vertebral artery in the neck. Skeleton: Levoconvex curvature is present  in the upper thoracic spine. Multilevel endplate changes are present from C3-4 through the cervicothoracic junction. Vertebral body heights are maintained. Facet hypertrophy contributes to multilevel foraminal narrowing. No focal lytic or blastic  lesions are present. Other neck: Heterogeneous thyroid is present with multiple subcentimeter nodules in inferior extension on the left. No significant adenopathy is present. Salivary glands are within normal limits. Upper chest: Mild dependent atelectasis is present in the upper lobes bilaterally. No focal nodule, mass, or airspace disease is present. The thoracic inlet is within limits. Superior mediastinum is otherwise unremarkable. Review of the MIP images confirms the above findings CTA HEAD FINDINGS Anterior circulation: Atherosclerotic calcifications are present within the cavernous internal carotid artery is bilaterally without significant stenosis relative to the more distal vessels. The ICA termini are within normal limits. The right A1 segment is hypoplastic compared to the left. The anterior communicating artery is patent. The left A1 segment above the M1 segments are normal. The MCA bifurcations are intact. There is asymmetric attenuation of MCA branch vessels on the left without a focal stenosis or occlusion. The bilateral ACA and right MCA branch vessels are within normal limits. Posterior circulation: Left vertebral artery is the dominant vessel. PICA origins are visualized and normal bilaterally. Basilar artery is tortuous. A fetal type right posterior cerebral artery is present. The left posterior cerebral artery originates from a posterior communicating artery and a left P1 segment. The PCA branch vessels demonstrate mild distal irregularity without a significant proximal stenosis or occlusion. Venous sinuses: The dural sinuses are patent. Left transverse sinus is patent. Straight sinus deep cerebral veins are intact. Cortical veins are unremarkable. Anatomic variants: Fetal type right posterior cerebral artery. Delayed phase: Not performed. Review of the MIP images confirms the above findings IMPRESSION: 1. Asymmetric attenuation of left MCA branch vessels without a significant proximal stenosis  or occlusion. 2. Atherosclerotic calcifications at the left greater than right carotid bifurcation without significant stenosis. 3.  Aortic Atherosclerosis (ICD10-I70.0). 4. Mild distal small vessel disease without a significant proximal stenosis, aneurysm, or branch vessel occlusion within the circle of Willis. These results were called by telephone at the time of interpretation on 01/12/2017 at 1:39 PM to Dr. Amie Portland , who verbally acknowledged these results. Electronically Signed   By: San Morelle M.D.   On: 01/12/2017 14:05   Mr Brain Wo Contrast  Result Date: 01/12/2017 CLINICAL DATA:  Stroke.  Right-sided weakness facial droop EXAM: MRI HEAD WITHOUT CONTRAST MRA HEAD WITHOUT CONTRAST TECHNIQUE: Multiplanar, multiecho pulse sequences of the brain and surrounding structures were obtained without intravenous contrast. Angiographic images of the head were obtained using MRA technique without contrast. COMPARISON:  CT perfusion 01/12/2017 FINDINGS: MRI HEAD FINDINGS Brain: Acute infarct in the left posterior insular cortex and left parietal white matter. Probable mild associated hemorrhage in the left insular infarct. Moderate chronic microvascular ischemic changes in the white matter. Chronic ischemia in the right basal ganglia including probable chronic infarct in the right internal capsule. Negative for mass lesion. Moderate atrophy. Vascular: Normal arterial flow voids. Skull and upper cervical spine: Negative Sinuses/Orbits: Small air-fluid levels maxillary sinus bilaterally. Mucosal edema paranasal sinuses. Bilateral lens replacement Other: None MRA HEAD FINDINGS Right vertebral artery dominant and widely patent to the basilar. Small left vertebral artery contributes to the basilar. PICA patent bilaterally. Basilar patent and tortuous. Superior cerebellar and posterior cerebral arteries patent bilaterally. Fetal origin right posterior cerebral artery. Internal carotid artery widely patent  bilaterally. Anterior and middle cerebral artery patent  without stenosis or occlusion. IMPRESSION: Small acute infarct in the left posterior insula with associated hemorrhage. Acute infarct in the left parietal white matter Atrophy and moderate chronic ischemic changes. Negative MRA head Electronically Signed   By: Franchot Gallo M.D.   On: 01/12/2017 21:35   Ct Cerebral Perfusion W Contrast  Result Date: 01/12/2017 CLINICAL DATA:  Right-sided weakness.  Right-sided gaze. EXAM: CT PERFUSION BRAIN TECHNIQUE: Multiphase CT imaging of the brain was performed following IV bolus contrast injection. Subsequent parametric perfusion maps were calculated using RAPID software. CONTRAST:  31mL ISOVUE-370 IOPAMIDOL (ISOVUE-370) INJECTION 76% COMPARISON:  CT head without contrast from the same day. FINDINGS: CT Brain Perfusion Findings: CBF (<30%) Volume: 33mL Perfusion (Tmax>6.0s) volume: 19mL Mismatch Volume: 67mL Infarction Location:Posterior left frontal lobe along the primary motor cortex IMPRESSION: Area of focal ischemia along the posterior left frontal lobe at the primary motor cortex without evidence for infarct. Estimated volume is 4 mL. These results were called by telephone at the time of interpretation on 01/12/2017 at 1:39 pm to Dr. Amie Portland , who verbally acknowledged these results. Electronically Signed   By: San Morelle M.D.   On: 01/12/2017 13:54   Mr Jodene Nam Head Wo Contrast  Result Date: 01/12/2017 CLINICAL DATA:  Stroke.  Right-sided weakness facial droop EXAM: MRI HEAD WITHOUT CONTRAST MRA HEAD WITHOUT CONTRAST TECHNIQUE: Multiplanar, multiecho pulse sequences of the brain and surrounding structures were obtained without intravenous contrast. Angiographic images of the head were obtained using MRA technique without contrast. COMPARISON:  CT perfusion 01/12/2017 FINDINGS: MRI HEAD FINDINGS Brain: Acute infarct in the left posterior insular cortex and left parietal white matter. Probable mild  associated hemorrhage in the left insular infarct. Moderate chronic microvascular ischemic changes in the white matter. Chronic ischemia in the right basal ganglia including probable chronic infarct in the right internal capsule. Negative for mass lesion. Moderate atrophy. Vascular: Normal arterial flow voids. Skull and upper cervical spine: Negative Sinuses/Orbits: Small air-fluid levels maxillary sinus bilaterally. Mucosal edema paranasal sinuses. Bilateral lens replacement Other: None MRA HEAD FINDINGS Right vertebral artery dominant and widely patent to the basilar. Small left vertebral artery contributes to the basilar. PICA patent bilaterally. Basilar patent and tortuous. Superior cerebellar and posterior cerebral arteries patent bilaterally. Fetal origin right posterior cerebral artery. Internal carotid artery widely patent bilaterally. Anterior and middle cerebral artery patent without stenosis or occlusion. IMPRESSION: Small acute infarct in the left posterior insula with associated hemorrhage. Acute infarct in the left parietal white matter Atrophy and moderate chronic ischemic changes. Negative MRA head Electronically Signed   By: Franchot Gallo M.D.   On: 01/12/2017 21:35   Ct Head Code Stroke Wo Contrast  Result Date: 01/12/2017 CLINICAL DATA:  Code stroke. Code stroke. Right-sided weakness. Right-sided gaze. EXAM: CT HEAD WITHOUT CONTRAST TECHNIQUE: Contiguous axial images were obtained from the base of the skull through the vertex without intravenous contrast. COMPARISON:  MRI brain 07/20/2010 FINDINGS: Brain: Extensive periventricular and subcortical white matter disease has progressed bilaterally. No acute cortical infarct is present. Basal ganglia are intact bilaterally. Insular ribbon is normal. White matter changes extend into the brainstem. The cerebellum is unremarkable. Vascular: Vascular calcifications are present in the cavernous internal carotid artery is bilaterally and at the dural  margin of the right vertebral artery. There is no hyperdense vessel. Skull: The calvarium is intact. Study is mildly degraded by patient motion. Sinuses/Orbits: Bilateral maxillary sinus surgery is noted. Small fluid levels are present in the maxillary sinuses bilaterally. Ethmoidectomies are  present. Ostium is present in the inferior left frontal sinus. Minimal fluid is present in the right sphenoid sinus. The mastoid air cells are clear bilaterally. Bilateral lens replacements are present. Globes and orbits are within normal limits. ASPECTS [facility] Stroke Program Early CT Score) - Ganglionic level infarction (caudate, lentiform nuclei, internal capsule, insula, M1-M3 cortex): 7/7 - Supraganglionic infarction (M4-M6 cortex): 3/3 Total score (0-10 with 10 being normal): 10/10 IMPRESSION: 1. No acute intracranial abnormality 2. Progressive diffuse white matter disease compatible with chronic microvascular ischemia 3. ASPECTS is 10/10 The above was relayed via text pager to Dr. Rory Percy On 01/12/2017 at 13:27 . Electronically Signed   By: San Morelle M.D.   On: 01/12/2017 13:28     Scheduled Meds: . aspirin  325 mg Oral Daily  . donepezil  10 mg Oral QHS  . enoxaparin (LOVENOX) injection  30 mg Subcutaneous Q24H  . gabapentin  300 mg Oral BID  . levothyroxine  62.5 mcg Oral QAC breakfast  . losartan  25 mg Oral Daily  . memantine  21 mg Oral Daily  . metoprolol succinate  50 mg Oral Daily  . mometasone-formoterol  2 puff Inhalation BID  . simvastatin  40 mg Oral QHS   Continuous Infusions: . sodium chloride 50 mL/hr at 01/12/17 2258     LOS: 1 day   Time Spent in minutes   30 minutes  Fatimah Sundquist D.O. on 01/13/2017 at 10:54 AM  Between 7am to 7pm - Pager - [phone]  After 7pm go to [email or web address] - password [email or web address]  And look for the night coverage person covering for me after hours  [facility] Office  [phone]

## 2017-01-13 NOTE — Evaluation (Signed)
Speech Language Pathology Evaluation Patient Details Name: Joanna [name] MRN: [id number] DOB: 09-09-1928 Today's Date: 01/13/2017 Time: 1650-1710 SLP Time Calculation (min) (ACUTE ONLY): 20 min  Problem List:  Patient Active Problem List   Diagnosis Date Noted  . CVA (cerebral vascular accident) ([place]) 01/12/2017  . Dementia 01/12/2017  . Medication management 09/24/2014  . Urinary incontinence 12/09/2012  . Pain in joint, upper arm 10/30/2012  . Hereditary and idiopathic peripheral neuropathy 08/08/2012  . Extrinsic asthma 07/28/2012  . Pure hypercholesterolemia 07/28/2012  . Iron deficiency anemia, unspecified 06/26/2012  . Unspecified constipation 06/23/2012  . Osteoporosis 05/08/2012  . Closed left hip fracture ([place]) 05/04/2012  . Fall due to stumbling 05/04/2012  . Anemia 05/04/2012  . Bronchiectasis without acute exacerbation ([place]) 04/20/2010  . SINUSITIS, CHRONIC 02/03/2007  . 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 Essential hypertension 01/01/2007  . ALLERGIC RHINITIS 01/01/2007  . Chronic obstructive airway disease with asthma ([place]) 01/01/2007  . GERD 01/01/2007  . Irritable bowel syndrome 01/01/2007  . DIVERTICULOSIS, COLON 10/07/2003   Past Medical History:  Past Medical History:  Diagnosis Date  . Allergic rhinitis   . Asthma   . Complication of anesthesia    confusion after 04/2012  . GERD (gastroesophageal reflux disease)   . History of urinary infection   . Hyperlipidemia   . Hypertension   . Hypothyroid   . IBS (irritable bowel syndrome)   . Neuropathy    Past Surgical History:  Past Surgical History:  Procedure Laterality Date  . ABDOMINAL HYSTERECTOMY    . BLADDER SURGERY    . CLOSED REDUCTION WRIST FRACTURE Left 05/04/2012   Procedure: CLOSED REDUCTION WRIST;  Surgeon: Mauri Pole, MD;  Location: [facility];  Service: Orthopedics;  Laterality: Left;  with casting  . FOOT SURGERY    . HIP ARTHROPLASTY Left 05/04/2012   Procedure: ARTHROPLASTY  BIPOLAR HIP;  Surgeon: Mauri Pole, MD;  Location: [facility];  Service: Orthopedics;  Laterality: Left;  Marland Kitchen MASTECTOMY     double  . NASAL SINUS SURGERY    . OPEN REDUCTION INTERNAL FIXATION (ORIF) DISTAL RADIAL FRACTURE Left 05/29/2012   Procedure: OPEN REDUCTION INTERNAL FIXATION (ORIF) DISTAL RADIAL FRACTURE;  Surgeon: Linna Hoff, MD;  Location: [place];  Service: Orthopedics;  Laterality: Left;   HPI:  83 y.o. female with medical history significant of dementia, HTN, HLD who lives at home with her son. She was brought in for evaluation of right sided weakness and some right sided gaze preference. + Acute infarcts left posterior insula with associated hemorrhage, left parietal white matter.    Assessment / Plan / Recommendation Clinical Impression  Patient presents with cognitive impairment, at least some of which is due to baseline dementia. She is pleasant and talkative, though some word-finding issues noted in conversation and confrontation, divergent naming moderately impaired. Her conversation is quite functional, despite anomia and confusion. I spoke with pt's son briefly via phone who confirmed he is in the process of moving pt into her home because, "She couldn't live by herself anymore." Agree that pt would benefit from skilled ST in SNF to improve safety awareness, quality of life and to reduce caregiver burden. Will follow briefly for family education and diagnostic treatment of cognitive-linguistic impairments.     SLP Assessment  SLP Recommendation/Assessment: Patient needs continued Speech Lanaguage Pathology Services SLP Visit Diagnosis: Aphasia (R47.01);Cognitive communication deficit (R41.841)    Follow Up Recommendations  Skilled Nursing facility    Frequency and Duration min  1 x/week  1 week      SLP Evaluation Cognition  Overall Cognitive Status: No family/caregiver present to determine baseline cognitive functioning Arousal/Alertness: Awake/alert Orientation  Level: Oriented to person;Disoriented to place;Disoriented to time;Disoriented to situation Attention: Focused;Sustained Focused Attention: Appears intact Sustained Attention: Impaired Sustained Attention Impairment: Verbal basic;Functional basic Memory: Impaired Memory Impairment: Storage deficit;Decreased recall of new information;Decreased short term memory Decreased Short Term Memory: Verbal basic;Functional basic(pt does not recall instructions for call bell after 30 sec) Awareness: Impaired Awareness Impairment: Emergent impairment;Anticipatory impairment Problem Solving: Impaired Problem Solving Impairment: Verbal basic;Functional basic Safety/Judgment: Impaired       Comprehension  Auditory Comprehension Overall Auditory Comprehension: Appears within functional limits for tasks assessed Yes/No Questions: Within Functional Limits Commands: Within Functional Limits Conversation: Simple Interfering Components: Attention EffectiveTechniques: Repetition Visual Recognition/Discrimination Discrimination: Within Function Limits Reading Comprehension Reading Status: Not tested    Expression Expression Primary Mode of Expression: Verbal Verbal Expression Overall Verbal Expression: Impaired Initiation: No impairment Automatic Speech: Name;Social Response Level of Generative/Spontaneous Verbalization: Conversation Repetition: Impaired Level of Impairment: Sentence level Naming: Impairment Responsive: 51-75% accurate Confrontation: Impaired Convergent: 75-100% accurate(75%) Divergent: 25-49% accurate(5 animals in 60 seconds) Other Naming Comments: wordfinding issues in conversation Pragmatics: No impairment Effective Techniques: Open ended questions;Sentence completion Non-Verbal Means of Communication: Not applicable Written Expression Dominant Hand: Right Written Expression: Not tested   Oral / Motor  Oral Motor/Sensory Function Overall Oral Motor/Sensory Function:  Within functional limits Motor Speech Overall Motor Speech: Appears within functional limits for tasks assessed   [place], [place], [place] Pathologist [phone]         ALEYZA SALMI 01/13/2017, 5:40 PM

## 2017-01-13 NOTE — Evaluation (Signed)
Occupational Therapy Evaluation Patient Details Name: Joanna [name] MRN: [id number] DOB: 07-13-1928 Today's Date: 01/13/2017    History of Present Illness 83 y.o. female with medical history significant of dementia, HTN, HLD who lives at home with her son.  She was brought in for evaluation of right sided weakness and some right sided gaze preference   Clinical Impression   Unsure of PLOF; per PT eval family was assisting with bathing PTA. Currently pt requires mod assist for stand pivot transfer, min assist for seated UB ADL, and max assist for LB ADL. Recommending SNF for follow up to maximize independence and safety with ADL and functional mobility prior to return home. Pt would benefit from continued skilled OT to address established goals.    Follow Up Recommendations  SNF;Supervision/Assistance - 24 hour    Equipment Recommendations  Other (comment)(TBD)    Recommendations for Other Services       Precautions / Restrictions Precautions Precautions: Fall Restrictions Weight Bearing Restrictions: No      Mobility Bed Mobility Overal bed mobility: Needs Assistance Bed Mobility: Sit to Supine       Sit to supine: Supervision   General bed mobility comments: for safety, cues for initiation  Transfers Overall transfer level: Needs assistance Equipment used: 1 person hand held assist Transfers: Sit to/from Stand;Stand Pivot Transfers Sit to Stand: Min assist Stand pivot transfers: Mod assist       General transfer comment: Min assist to boost up from chair and for balance in standing. Cues for sequencing and initiation of stand pivot transfer    Balance Overall balance assessment: Needs assistance Sitting-balance support: Feet supported;No upper extremity supported Sitting balance-Leahy Scale: Fair     Standing balance support: Single extremity supported Standing balance-Leahy Scale: Poor                             ADL either performed or  assessed with clinical judgement   ADL Overall ADL's : Needs assistance/impaired Eating/Feeding: Minimal assistance;Sitting Eating/Feeding Details (indicate cue type and reason): Pt found with utensils on floor and milk spilled on herself, chair, and floor. Grooming: Minimal assistance;Sitting   Upper Body Bathing: Minimal assistance;Sitting   Lower Body Bathing: Maximal assistance;Sit to/from stand   Upper Body Dressing : Minimal assistance;Sitting   Lower Body Dressing: Maximal assistance;Sit to/from stand   Toilet Transfer: Moderate assistance;Stand-pivot;BSC(HHA) Toilet Transfer Details (indicate cue type and reason): Simualted by transfer chair to bed         Functional mobility during ADLs: Moderate assistance(HHA for stand pivot)       Vision   Additional Comments: Difficult to assess due to impaired cognition.     Perception     Praxis      Pertinent Vitals/Pain Pain Assessment: Faces Faces Pain Scale: Hurts little more Pain Location: back Pain Descriptors / Indicators: Aching Pain Intervention(s): Monitored during session;Repositioned     Hand Dominance Right   Extremity/Trunk Assessment Upper Extremity Assessment Upper Extremity Assessment: Generalized weakness;Difficult to assess due to impaired cognition   Lower Extremity Assessment Lower Extremity Assessment: Defer to PT evaluation   Cervical / Trunk Assessment Cervical / Trunk Assessment: Kyphotic   Communication Communication Communication: No difficulties   Cognition Arousal/Alertness: Awake/alert Behavior During Therapy: Anxious;Restless;Impulsive Overall Cognitive Status: No family/caregiver present to determine baseline cognitive functioning  General Comments: Hx of dementia, only oriented to self currently.   General Comments       Exercises     Shoulder Instructions      Home Living Family/patient expects to be discharged to::  Private residence Living Arrangements: Children   Type of Home: House Home Access: Stairs to enter Technical brewer of Steps: 2 Entrance Stairs-Rails: Can reach both Home Layout: One level               Home Equipment: [place] - 2 wheels;Cane - single point   Additional Comments: Pt unable to provide information.      Prior Functioning/Environment Level of Independence: Needs assistance    ADL's / Homemaking Assistance Needed: daughter in law helps with bathing   Comments: Information obtained from PT eval        OT Problem List: Decreased strength;Impaired balance (sitting and/or standing);Decreased cognition;Decreased safety awareness;Decreased knowledge of use of DME or AE;Pain      OT Treatment/Interventions: Self-care/ADL training;Therapeutic exercise;Energy conservation;DME and/or AE instruction;Therapeutic activities;Patient/family education;Balance training    OT Goals(Current goals can be found in the care plan section) Acute Rehab OT Goals Patient Stated Goal: get back in bed OT Goal Formulation: With patient Time For Goal Achievement: 01/27/17 Potential to Achieve Goals: Good  OT Frequency: Min 2X/week   Barriers to D/C:            Co-evaluation              AM-PAC PT "6 Clicks" Daily Activity     Outcome Measure Help from another person eating meals?: A Little Help from another person taking care of personal grooming?: A Little Help from another person toileting, which includes using toliet, bedpan, or urinal?: A Lot Help from another person bathing (including washing, rinsing, drying)?: A Lot Help from another person to put on and taking off regular upper body clothing?: A Little Help from another person to put on and taking off regular lower body clothing?: A Lot 6 Click Score: 15   End of Session    Activity Tolerance: Patient tolerated treatment well Patient left: in bed;with call bell/phone within reach;with bed alarm set  OT  Visit Diagnosis: Unsteadiness on feet (R26.81);Muscle weakness (generalized) (M62.81)                Time: 4097-3532 OT Time Calculation (min): 12 min Charges:  OT General Charges $OT Visit: 1 Visit OT Evaluation $OT Eval Moderate Complexity: 1 Mod G-Codes:     Emmalin Jaquess A. Ulice Brilliant, M.S., OTR/L Pager: [place] 01/13/2017, 9:31 AM

## 2017-01-13 NOTE — Progress Notes (Signed)
STROKE TEAM PROGRESS NOTE   SUBJECTIVE (INTERVAL HISTORY) No family is at the bedside.  Pt has no complains, but seems to have cognitive impairment. No gaze preference but seems to have some mild right sided neglect.   Home Medications:  Current Meds  Medication Sig  . acetaminophen (TYLENOL) 500 MG tablet Take 500 mg by mouth every 6 (six) hours as needed for mild pain.  Marland Kitchen albuterol (PROVENTIL HFA;VENTOLIN HFA) 108 (90 Base) MCG/ACT inhaler Inhale 2 puffs into the lungs every 6 (six) hours as needed for wheezing or shortness of breath.  . donepezil (ARICEPT) 10 MG tablet Take 1 tablet (10 mg total) by mouth at bedtime. (Patient taking differently: Take 10 mg by mouth every evening. )  . gabapentin (NEURONTIN) 300 MG capsule Take 1 capsule (300 mg total) by mouth 2 (two) times daily.  Marland Kitchen losartan (COZAAR) 25 MG tablet TAKE 1 TABLET BY MOUTH  DAILY (Patient taking differently: TAKE 1 TABLET(25MG ) BY MOUTH EVERY EVENING)  . magnesium hydroxide (MILK OF MAGNESIA) 400 MG/5ML suspension Take 5 mLs by mouth daily as needed for mild constipation.  . memantine (NAMENDA) 10 MG tablet Take 10 mg by mouth every evening.  . metoprolol succinate (TOPROL-XL) 50 MG 24 hr tablet Take 1 tablet (50 mg total) by mouth daily. Take with or immediately following a meal.  . polyethylene glycol (MIRALAX / GLYCOLAX) packet Take 17 g by mouth daily.  . simvastatin (ZOCOR) 20 MG tablet TAKE 1 TABLET BY MOUTH AT  BEDTIME (Patient taking differently: TAKE 1 TABLET (20MG )BY MOUTH EVERY EVENING)  . SYNTHROID 125 MCG tablet TAKE ONE-HALF TABLET BY  MOUTH EVERY MORNING (Patient taking differently: TAKE ONE-HALF TABLET(62.5MG ) BY  MOUTH EVERY EVENING)      Hospital Medications:  . aspirin  325 mg Oral Daily  . donepezil  10 mg Oral QHS  . enoxaparin (LOVENOX) injection  30 mg Subcutaneous Q24H  . gabapentin  300 mg Oral BID  . levothyroxine  62.5 mcg Oral QAC breakfast  . losartan  25 mg Oral Daily  . memantine  21 mg  Oral Daily  . metoprolol succinate  50 mg Oral Daily  . mometasone-formoterol  2 puff Inhalation BID  . simvastatin  40 mg Oral QHS    OBJECTIVE Temp:  [97.9 F (36.6 C)-99.2 F (37.3 C)] 97.9 F (36.6 C) (12/30 0559) Pulse Rate:  [52-98] 74 (12/30 0559) Cardiac Rhythm: Normal sinus rhythm (12/30 0700) Resp:  [14-18] 16 (12/30 0559) BP: (168-214)/(67-80) 189/75 (12/30 0559) SpO2:  [96 %-100 %] 97 % (12/30 0559) Weight:  [125 lb 0 oz (56.7 kg)] 125 lb 0 oz (56.7 kg) (12/29 1350)  CBC:  Recent Labs  Lab 01/12/17 1305 01/12/17 1314  WBC 8.7  --   NEUTROABS 5.3  --   HGB 12.2 12.2  HCT 37.8 36.0  MCV 89.2  --   PLT 229  --     Basic Metabolic Panel:  Recent Labs  Lab 01/12/17 1305 01/12/17 1314  NA 139 144  K 3.6 3.6  CL 108 109  CO2 23  --   GLUCOSE 104* 101*  BUN 23* 24*  CREATININE 1.04* 1.00  CALCIUM 8.9  --     Lipid Panel:     Component Value Date/Time   CHOL 184 01/13/2017 0433   TRIG 106 01/13/2017 0433   HDL 64 01/13/2017 0433   CHOLHDL 2.9 01/13/2017 0433   VLDL 21 01/13/2017 0433   LDLCALC 99 01/13/2017 0433   HgbA1c:  Lab Results  Component Value Date   HGBA1C 5.5 01/13/2017   Urine Drug Screen: No results found for: LABOPIA, COCAINSCRNUR, LABBENZ, AMPHETMU, THCU, LABBARB  Alcohol Level No results found for: [place] I have personally reviewed the radiological images below and agree with the radiology interpretations.  Ct Angio Head W Or Wo Contrast Ct Angio Neck W Or Wo Contrast  01/12/2017 IMPRESSION:  1. Asymmetric attenuation of left MCA branch vessels without a significant proximal stenosis or occlusion.  2. Atherosclerotic calcifications at the left greater than right carotid bifurcation without significant stenosis.  3. Aortic Atherosclerosis (ICD10-I70.0).  4. Mild distal small vessel disease without a significant proximal stenosis, aneurysm, or branch vessel occlusion within the circle of Willis.   Mr Joanna [name] Head Wo  Contrast 01/12/2017 IMPRESSION:  Small acute infarct in the left posterior insula with associated hemorrhage.  Acute infarct in the left parietal white matter  Atrophy and moderate chronic ischemic changes.  Negative MRA head   Ct Cerebral Perfusion W Contrast 01/12/2017 IMPRESSION:  Area of focal ischemia along the posterior left frontal lobe at the primary motor cortex without evidence for infarct. Estimated volume is 4 mL.  Ct Head Code Stroke Wo Contrast  01/12/2017 IMPRESSION:  1. No acute intracranial abnormality  2. Progressive diffuse white matter disease compatible with chronic microvascular ischemia  3. ASPECTS is 10/10   TTE - pending   PHYSICAL EXAM  Temp:  [97.9 F (36.6 C)-99.2 F (37.3 C)] 98.7 F (37.1 C) (12/30 1033) Pulse Rate:  [52-98] 81 (12/30 1033) Resp:  [14-18] 18 (12/30 1033) BP: (159-214)/(60-80) 159/60 (12/30 1033) SpO2:  [96 %-100 %] 98 % (12/30 1033) Weight:  [125 lb 0 oz (56.7 kg)] 125 lb 0 oz (56.7 kg) (12/29 1350)  General - Well nourished, well developed, in no apparent distress.  Ophthalmologic - Fundi not visualized due to noncooperation.  Cardiovascular - Regular rate and rhythm.  Mental Status -  Awake alert, orientated to month and self, but not orientated to year, age, place. Language including expression, naming, repetition, comprehension was assessed and found intact. However, pt tends to neglect RUE and RLE when asked to do the same in both arms or both legs Fund of Knowledge was assessed and was intact impaired.  Cranial Nerves II - XII - II - Visual field intact OU. III, IV, VI - Extraocular movements intact. V - Facial sensation intact bilaterally. VII - Facial movement intact bilaterally. VIII - Hearing & vestibular intact bilaterally. X - Palate elevates symmetrically. XI - Chin turning & shoulder shrug intact bilaterally. XII - Tongue protrusion intact.  Motor Strength - The patient's strength was 4/5 in all  extremities and pronator drift was absent.  Bulk was normal and fasciculations were absent.   Motor Tone - Muscle tone was assessed at the neck and appendages and was normal.  Reflexes - The patient's reflexes were 1+ in all extremities and she had no pathological reflexes.  Sensory - Light touch, temperature/pinprick were assessed and were symmetrical.    Coordination - The patient had normal movements in the hands with no ataxia or dysmetria, but slow in action.  Tremor was absent.  Gait and Station - deferred   ASSESSMENT/PLAN [name] is a 83 y.o. female with history of dementia, neuropathy, hypothyroidism, previous TIA, hypertension, and hyperlipidemia presenting with right sided weakness. She did not receive IV t-PA due to OSW.  Stroke:  left CR and insular cortex small infarcts - likely embolic -  source unknown, concerning for cardioembolic source.  Resultant  Right side mild neglect  CT head - No acute intracranial abnormality   MRI head - Small acute infarct in the left posterior insula and parietal WM with associated petechial hemorrhage.   MRA head - negative  CTA head and neck - decreased left MCA branches, athero at b/l ICA bulbs and siphons. Area of focal ischemia along the posterior left frontal lobe  2D Echo - pending  Recommend 30 day cardiac event monitoring as out pt to rule out afib  LDL - 99  HgbA1c - 5.5  VTE prophylaxis - Lovenox Diet Heart Room service appropriate? Yes; Fluid consistency: Thin  No antithrombotic prior to admission, now on aspirin 325 mg daily. Continue ASA on discharge.  Ongoing aggressive stroke risk factor management  Therapy recommendations:  SNF recommended  Disposition:  Pending  Dementia   Cognitive impairment on exam  On aricept and nemanda at home  Resume both during admission  Not good candidate for loop recorder at this time  Hypertension  BP high but within parameters.  Permissive hypertension (OK  if < 220/120) but gradually normalize in 5-7 days  Long-term BP goal normotensive  Hyperlipidemia  Home meds:  Zocor 20 mg daily PTA  LDL 99, goal < 70  Now on Zocor 40 mg daily  Continue statin at discharge  Other Stroke Risk Factors  Advanced age  Hx stroke/TIA  Hospital day # 1  Neurology will sign off. Please call with questions. Pt will follow up with Cecille Rubin, NP, at [facility] in about 6 weeks. Thanks for the consult.  Rosalin Hawking, MD PhD Stroke Neurology 01/13/2017 11:25 AM  To contact Stroke Continuity provider, please refer to [email or web address] After hours, contact General Neurology

## 2017-01-13 NOTE — Progress Notes (Signed)
Pt. Arrived to the unit Alert and Oriented only to self.  Disoriented to time, place and situation.  Denies pain  Bruises on bilateral hand and arms. Pt. Has dementia and states she does not know how she got bruise, they probably came from gardening.  No family at beside.  All questions and concern addressed, oriented to equipment in the room. Bed in lowest position with bed alarm set.

## 2017-01-13 NOTE — Evaluation (Signed)
Physical Therapy Evaluation Patient Details Name: Joanna [name] MRN: [id number] DOB: [birth year] Today's Date: 01/13/2017   History of Present Illness  83 y.o. female with medical history significant of dementia, HTN, HLD who lives at home with her son.  She was brought in for evaluation of right sided weakness and some right sided gaze preference. + Acute infarct  Clinical Impression  Orders received for PT evaluation. Patient demonstrates deficits in functional mobility as indicated below. Will benefit from continued skilled PT to address deficits and maximize function. Will see as indicated and progress as tolerated.      Follow Up Recommendations SNF;Supervision/Assistance - 24 hour    Equipment Recommendations  None recommended by PT    Recommendations for Other Services       Precautions / Restrictions Precautions Precautions: Fall Restrictions Weight Bearing Restrictions: No      Mobility  Bed Mobility Overal bed mobility: Needs Assistance Bed Mobility: Sit to Supine       Sit to supine: Supervision   General bed mobility comments: for safety, cues for initiation  Transfers Overall transfer level: Needs assistance Equipment used: 1 person hand held assist Transfers: Sit to/from Stand;Stand Pivot Transfers Sit to Stand: Min assist         General transfer comment: min assist to elevate to standing for stability and safety  Ambulation/Gait Ambulation/Gait assistance: Min assist Ambulation Distance (Feet): 16 Feet Assistive device: Rolling walker (2 wheeled) Gait Pattern/deviations: Step-to pattern;Decreased stride length;Shuffle;Drifts right/left Gait velocity: decreased   General Gait Details: patient at times self limiting, very anxious and confused throughout session  Stairs            Wheelchair Mobility    Modified Rankin (Stroke Patients Only)       Balance Overall balance assessment: Needs assistance Sitting-balance support: Feet  supported;No upper extremity supported Sitting balance-Leahy Scale: Fair     Standing balance support: Single extremity supported Standing balance-Leahy Scale: Poor                               Pertinent Vitals/Pain Pain Assessment: Faces Faces Pain Scale: Hurts little more Pain Location: back Pain Descriptors / Indicators: Aching Pain Intervention(s): Monitored during session;Repositioned    Home Living Family/patient expects to be discharged to:: Private residence Living Arrangements: Children               Additional Comments: Pt unable to provide information.    Prior Function Level of Independence: Needs assistance      ADL's / Homemaking Assistance Needed: daughter in law helps with bathing        Hand Dominance   Dominant Hand: Right    Extremity/Trunk Assessment   Upper Extremity Assessment Upper Extremity Assessment: Generalized weakness;Difficult to assess due to impaired cognition    Lower Extremity Assessment Lower Extremity Assessment: Generalized weakness    Cervical / Trunk Assessment Cervical / Trunk Assessment: Kyphotic  Communication   Communication: No difficulties  Cognition Arousal/Alertness: Awake/alert Behavior During Therapy: Anxious;Restless;Impulsive Overall Cognitive Status: No family/caregiver present to determine baseline cognitive functioning                                 General Comments: Hx of dementia, only oriented to self currently.      General Comments      Exercises     Assessment/Plan    PT  Assessment Patient needs continued PT services  PT Problem List Decreased strength;Decreased range of motion;Decreased activity tolerance;Decreased balance;Decreased mobility;Decreased coordination;Decreased cognition;Decreased safety awareness;Pain       PT Treatment Interventions DME instruction;Gait training;Stair training;Functional mobility training;Therapeutic activities;Therapeutic  exercise;Balance training;Patient/family education    PT Goals (Current goals can be found in the Care Plan section)  Acute Rehab PT Goals Patient Stated Goal: to do her hair PT Goal Formulation: With patient Time For Goal Achievement: 01/27/17 Potential to Achieve Goals: Fair    Frequency Min 3X/week   Barriers to discharge        Co-evaluation               AM-PAC PT "6 Clicks" Daily Activity  Outcome Measure Difficulty turning over in bed (including adjusting bedclothes, sheets and blankets)?: Unable Difficulty moving from lying on back to sitting on the side of the bed? : Unable Difficulty sitting down on and standing up from a chair with arms (e.g., wheelchair, bedside commode, etc,.)?: Unable Help needed moving to and from a bed to chair (including a wheelchair)?: A Little Help needed walking in hospital room?: A Little Help needed climbing 3-5 steps with a railing? : A Lot 6 Click Score: 11    End of Session Equipment Utilized During Treatment: Gait belt Activity Tolerance: Patient tolerated treatment well Patient left: in chair;with call bell/phone within reach Nurse Communication: Mobility status PT Visit Diagnosis: Difficulty in walking, not elsewhere classified (R26.2)    Time: 2549-8264 PT Time Calculation (min) (ACUTE ONLY): 22 min   Charges:   PT Evaluation $PT Eval Moderate Complexity: 1 Mod     PT G Codes:        Alben Deeds, PT DPT  Board Certified Neurologic Specialist [place] 01/13/2017, 12:10 PM

## 2017-01-14 ENCOUNTER — Inpatient Hospital Stay (HOSPITAL_COMMUNITY): Payer: Medicare Other

## 2017-01-14 DIAGNOSIS — I361 Nonrheumatic tricuspid (valve) insufficiency: Secondary | ICD-10-CM

## 2017-01-14 LAB — ECHOCARDIOGRAM COMPLETE: Weight: 2000.01 oz

## 2017-01-14 NOTE — Progress Notes (Signed)
PROGRESS NOTE    Joanna [name]  CHY:[id number] DOB: 09/15/1928 DOA: 01/12/2017 PCP: Lauree Chandler, NP   Chief Complaint  Patient presents with  . Code Stroke    Brief Narrative:  83 y.o. female with medical history significant of dementia, HTN, HLD who lives at home with her son.  She was brought in for evaluation of right sided weakness and some right sided gaze preference. Admitted for CVA.  Assessment & Plan   Acute CVA -Presented with right-sided weakness -CT head showed no acute intracranial abnormality -CTA Head: Asymmetric attenuation of left MCA branch vessels without a significant proximal stenosis or occlusion -MRI brain: Small acute infarct in the left posterior insula with associated hemorrhage. Acute infarct in the left parietal white matter Atrophy and moderate chronic ischemic changes -Echocardiogram pending -LDL 99, hemoglobin A1c 5.5 -Neurology consulted and appreciated. Recommended continuing Aspirin 325mg  and statin daily at discharge, 30 day event monitor to rule out AF -PT/OT recommended SNF -Social work consulted for SNF placement -Continue aspirin and statin  Essential hypertension -Allow for permissive hypertension given acute CVA -Patient currently on metoprolol and losartan  Hyperlipidemia -lipid panel: TC 24, HDL 64, LDL 99, triglycerides 106 -Continue statin  Dementia -Continue Namenda, Aricept  Hypothyroidism -Continue Synthroid  DVT Prophylaxis  Lovenox  Code Status: Full  Family Communication: None at bedside  Disposition Plan: admitted, pending echocardiogram, will need SNF at discharge- possibly 01/15/2017  Consultants Neurology  Procedures  None  Antibiotics   Anti-infectives (From admission, onward)   None      Subjective:   [name] seen and examined today.  Continues to complain of left foot pain. States it keeps coming back. Denies chest pain or shortness of breath. Has dementia.  Objective:   Vitals:   01/14/17 0209 01/14/17 0517 01/14/17 0858 01/14/17 0928  BP: (!) 158/52 (!) 168/64  (!) 147/54  Pulse: 62 68  70  Resp: 18 18  17   Temp: 97.9 F (36.6 C) 98.1 F (36.7 C)  98.5 F (36.9 C)  TempSrc: Oral Oral  Oral  SpO2: 96% 99% 98% 97%  Weight:        Intake/Output Summary (Last 24 hours) at 01/14/2017 1003 Last data filed at 01/14/2017 8786 Gross per 24 hour  Intake 1590 ml  Output -  Net 1590 ml   Filed Weights   01/12/17 1350  Weight: 56.7 kg (125 lb 0 oz)    Exam  General: Well developed, elderly, NAD  HEENT: NCAT, mucous membranes moist.   Cardiovascular: S1 S2 auscultated, no murmur, RRR  Respiratory: Clear to auscultation bilaterally with equal chest rise, no wheezing  Abdomen: Soft, nontender, nondistended, + bowel sounds  Extremities: warm dry without cyanosis clubbing or edema  Neuro: AAOx2 (self only), with dementia. RUE weakness, RLE weakness  Skin: Without rashes exudates or nodules, multiple areas of bruising  Psych: appropriate, confused   Data Reviewed: I have personally reviewed following labs and imaging studies  CBC: Recent Labs  Lab 01/12/17 1305 01/12/17 1314  WBC 8.7  --   NEUTROABS 5.3  --   HGB 12.2 12.2  HCT 37.8 36.0  MCV 89.2  --   PLT 229  --    Basic Metabolic Panel: Recent Labs  Lab 01/12/17 1305 01/12/17 1314  NA 139 144  K 3.6 3.6  CL 108 109  CO2 23  --   GLUCOSE 104* 101*  BUN 23* 24*  CREATININE 1.04* 1.00  CALCIUM 8.9  --  GFR: Estimated Creatinine Clearance: 36.1 mL/min (by C-G formula based on SCr of 1 mg/dL). Liver Function Tests: Recent Labs  Lab 01/12/17 1305  AST 25  ALT 12*  ALKPHOS 50  BILITOT 0.8  PROT 6.3*  ALBUMIN 3.7   No results for input(s): LIPASE, AMYLASE in the last 168 hours. No results for input(s): AMMONIA in the last 168 hours. Coagulation Profile: Recent Labs  Lab 01/12/17 1305  INR 1.00   Cardiac Enzymes: No results for input(s): CKTOTAL, CKMB, CKMBINDEX,  TROPONINI in the last 168 hours. BNP (last 3 results) No results for input(s): PROBNP in the last 8760 hours. HbA1C: Recent Labs    01/13/17 0433  HGBA1C 5.5   CBG: Recent Labs  Lab 01/12/17 1558  GLUCAP 89   Lipid Profile: Recent Labs    01/13/17 0433  CHOL 184  HDL 64  LDLCALC 99  TRIG 106  CHOLHDL 2.9   Thyroid Function Tests: No results for input(s): TSH, T4TOTAL, FREET4, T3FREE, THYROIDAB in the last 72 hours. Anemia Panel: No results for input(s): VITAMINB12, FOLATE, FERRITIN, TIBC, IRON, RETICCTPCT in the last 72 hours. Urine analysis:    Component Value Date/Time   COLORURINE YELLOW 02/10/2016 [place] 02/10/2016 1519   LABSPEC 1.006 02/10/2016 1519   PHURINE 7.0 02/10/2016 1519   GLUCOSEU NEGATIVE 02/10/2016 1519   GLUCOSEU NEGATIVE 04/19/2015 1335   HGBUR NEGATIVE 02/10/2016 1519   BILIRUBINUR NEGATIVE 02/10/2016 1519   KETONESUR NEGATIVE 02/10/2016 1519   PROTEINUR NEGATIVE 02/10/2016 1519   UROBILINOGEN 0.2 04/19/2015 1335   NITRITE NEGATIVE 02/10/2016 1519   LEUKOCYTESUR NEGATIVE 02/10/2016 1519   Sepsis Labs: @LABRCNTIP (procalcitonin:4,lacticidven:4)  )No results found for this or any previous visit (from the past 240 hour(s)).    Radiology Studies: Ct Angio Head W Or Wo Contrast  Result Date: 01/12/2017 CLINICAL DATA:  Code stroke. Right-sided weakness. Right-sided gaze. EXAM: CT ANGIOGRAPHY HEAD AND NECK TECHNIQUE: Multidetector CT imaging of the head and neck was performed using the standard protocol during bolus administration of intravenous contrast. Multiplanar CT image reconstructions and MIPs were obtained to evaluate the vascular anatomy. Carotid stenosis measurements (when applicable) are obtained utilizing NASCET criteria, using the distal internal carotid diameter as the denominator. CONTRAST:  9mL ISOVUE-370 IOPAMIDOL (ISOVUE-370) INJECTION 76% COMPARISON:  CT head without contrast from the same day. CT perfusion  from the same day. FINDINGS: CTA NECK FINDINGS Aortic arch: A 3 vessel arch configuration is present. Dense atherosclerotic calcifications are present at the aortic arch without significant stenosis. There are calcifications associated with the origins the great vessels without significant stenosis. Right carotid system: The right common carotid artery demonstrates mild tortuosity. There is no significant stenosis. Right carotid bifurcation is intact. The cervical right ICA is mildly tortuous without significant stenosis. Left carotid system: Calcifications are present along the anterior and lateral wall of the distal left common carotid artery. There are calcifications at the carotid bifurcation. No significant stenosis is present. Moderate tortuosity is present in the cervical left ICA without significant stenosis. Vertebral arteries: The vertebral artery is originate from the subclavian artery is bilaterally without significant stenosis. There is calcification of the origins of both vertebral arteries. Left vertebral artery is dominant. No focal stenosis is present in either vertebral artery in the neck. Skeleton: Levoconvex curvature is present in the upper thoracic spine. Multilevel endplate changes are present from C3-4 through the cervicothoracic junction. Vertebral body heights are maintained. Facet hypertrophy contributes to multilevel foraminal narrowing. No focal lytic or  blastic lesions are present. Other neck: Heterogeneous thyroid is present with multiple subcentimeter nodules in inferior extension on the left. No significant adenopathy is present. Salivary glands are within normal limits. Upper chest: Mild dependent atelectasis is present in the upper lobes bilaterally. No focal nodule, mass, or airspace disease is present. The thoracic inlet is within limits. Superior mediastinum is otherwise unremarkable. Review of the MIP images confirms the above findings CTA HEAD FINDINGS Anterior circulation:  Atherosclerotic calcifications are present within the cavernous internal carotid artery is bilaterally without significant stenosis relative to the more distal vessels. The ICA termini are within normal limits. The right A1 segment is hypoplastic compared to the left. The anterior communicating artery is patent. The left A1 segment above the M1 segments are normal. The MCA bifurcations are intact. There is asymmetric attenuation of MCA branch vessels on the left without a focal stenosis or occlusion. The bilateral ACA and right MCA branch vessels are within normal limits. Posterior circulation: Left vertebral artery is the dominant vessel. PICA origins are visualized and normal bilaterally. Basilar artery is tortuous. A fetal type right posterior cerebral artery is present. The left posterior cerebral artery originates from a posterior communicating artery and a left P1 segment. The PCA branch vessels demonstrate mild distal irregularity without a significant proximal stenosis or occlusion. Venous sinuses: The dural sinuses are patent. Left transverse sinus is patent. Straight sinus deep cerebral veins are intact. Cortical veins are unremarkable. Anatomic variants: Fetal type right posterior cerebral artery. Delayed phase: Not performed. Review of the MIP images confirms the above findings IMPRESSION: 1. Asymmetric attenuation of left MCA branch vessels without a significant proximal stenosis or occlusion. 2. Atherosclerotic calcifications at the left greater than right carotid bifurcation without significant stenosis. 3.  Aortic Atherosclerosis (ICD10-I70.0). 4. Mild distal small vessel disease without a significant proximal stenosis, aneurysm, or branch vessel occlusion within the circle of Willis. These results were called by telephone at the time of interpretation on 01/12/2017 at 1:39 PM to Dr. Amie Portland , who verbally acknowledged these results. Electronically Signed   By: San Morelle M.D.   On:  01/12/2017 14:05   Ct Angio Neck W Or Wo Contrast  Result Date: 01/12/2017 CLINICAL DATA:  Code stroke. Right-sided weakness. Right-sided gaze. EXAM: CT ANGIOGRAPHY HEAD AND NECK TECHNIQUE: Multidetector CT imaging of the head and neck was performed using the standard protocol during bolus administration of intravenous contrast. Multiplanar CT image reconstructions and MIPs were obtained to evaluate the vascular anatomy. Carotid stenosis measurements (when applicable) are obtained utilizing NASCET criteria, using the distal internal carotid diameter as the denominator. CONTRAST:  45mL ISOVUE-370 IOPAMIDOL (ISOVUE-370) INJECTION 76% COMPARISON:  CT head without contrast from the same day. CT perfusion from the same day. FINDINGS: CTA NECK FINDINGS Aortic arch: A 3 vessel arch configuration is present. Dense atherosclerotic calcifications are present at the aortic arch without significant stenosis. There are calcifications associated with the origins the great vessels without significant stenosis. Right carotid system: The right common carotid artery demonstrates mild tortuosity. There is no significant stenosis. Right carotid bifurcation is intact. The cervical right ICA is mildly tortuous without significant stenosis. Left carotid system: Calcifications are present along the anterior and lateral wall of the distal left common carotid artery. There are calcifications at the carotid bifurcation. No significant stenosis is present. Moderate tortuosity is present in the cervical left ICA without significant stenosis. Vertebral arteries: The vertebral artery is originate from the subclavian artery is bilaterally without significant stenosis.  There is calcification of the origins of both vertebral arteries. Left vertebral artery is dominant. No focal stenosis is present in either vertebral artery in the neck. Skeleton: Levoconvex curvature is present in the upper thoracic spine. Multilevel endplate changes are  present from C3-4 through the cervicothoracic junction. Vertebral body heights are maintained. Facet hypertrophy contributes to multilevel foraminal narrowing. No focal lytic or blastic lesions are present. Other neck: Heterogeneous thyroid is present with multiple subcentimeter nodules in inferior extension on the left. No significant adenopathy is present. Salivary glands are within normal limits. Upper chest: Mild dependent atelectasis is present in the upper lobes bilaterally. No focal nodule, mass, or airspace disease is present. The thoracic inlet is within limits. Superior mediastinum is otherwise unremarkable. Review of the MIP images confirms the above findings CTA HEAD FINDINGS Anterior circulation: Atherosclerotic calcifications are present within the cavernous internal carotid artery is bilaterally without significant stenosis relative to the more distal vessels. The ICA termini are within normal limits. The right A1 segment is hypoplastic compared to the left. The anterior communicating artery is patent. The left A1 segment above the M1 segments are normal. The MCA bifurcations are intact. There is asymmetric attenuation of MCA branch vessels on the left without a focal stenosis or occlusion. The bilateral ACA and right MCA branch vessels are within normal limits. Posterior circulation: Left vertebral artery is the dominant vessel. PICA origins are visualized and normal bilaterally. Basilar artery is tortuous. A fetal type right posterior cerebral artery is present. The left posterior cerebral artery originates from a posterior communicating artery and a left P1 segment. The PCA branch vessels demonstrate mild distal irregularity without a significant proximal stenosis or occlusion. Venous sinuses: The dural sinuses are patent. Left transverse sinus is patent. Straight sinus deep cerebral veins are intact. Cortical veins are unremarkable. Anatomic variants: Fetal type right posterior cerebral artery.  Delayed phase: Not performed. Review of the MIP images confirms the above findings IMPRESSION: 1. Asymmetric attenuation of left MCA branch vessels without a significant proximal stenosis or occlusion. 2. Atherosclerotic calcifications at the left greater than right carotid bifurcation without significant stenosis. 3.  Aortic Atherosclerosis (ICD10-I70.0). 4. Mild distal small vessel disease without a significant proximal stenosis, aneurysm, or branch vessel occlusion within the circle of Willis. These results were called by telephone at the time of interpretation on 01/12/2017 at 1:39 PM to Dr. Amie Portland , who verbally acknowledged these results. Electronically Signed   By: San Morelle M.D.   On: 01/12/2017 14:05   Mr Brain Wo Contrast  Result Date: 01/12/2017 CLINICAL DATA:  Stroke.  Right-sided weakness facial droop EXAM: MRI HEAD WITHOUT CONTRAST MRA HEAD WITHOUT CONTRAST TECHNIQUE: Multiplanar, multiecho pulse sequences of the brain and surrounding structures were obtained without intravenous contrast. Angiographic images of the head were obtained using MRA technique without contrast. COMPARISON:  CT perfusion 01/12/2017 FINDINGS: MRI HEAD FINDINGS Brain: Acute infarct in the left posterior insular cortex and left parietal white matter. Probable mild associated hemorrhage in the left insular infarct. Moderate chronic microvascular ischemic changes in the white matter. Chronic ischemia in the right basal ganglia including probable chronic infarct in the right internal capsule. Negative for mass lesion. Moderate atrophy. Vascular: Normal arterial flow voids. Skull and upper cervical spine: Negative Sinuses/Orbits: Small air-fluid levels maxillary sinus bilaterally. Mucosal edema paranasal sinuses. Bilateral lens replacement Other: None MRA HEAD FINDINGS Right vertebral artery dominant and widely patent to the basilar. Small left vertebral artery contributes to the basilar. PICA patent  bilaterally. Basilar patent and tortuous. Superior cerebellar and posterior cerebral arteries patent bilaterally. Fetal origin right posterior cerebral artery. Internal carotid artery widely patent bilaterally. Anterior and middle cerebral artery patent without stenosis or occlusion. IMPRESSION: Small acute infarct in the left posterior insula with associated hemorrhage. Acute infarct in the left parietal white matter Atrophy and moderate chronic ischemic changes. Negative MRA head Electronically Signed   By: Franchot Gallo M.D.   On: 01/12/2017 21:35   Ct Cerebral Perfusion W Contrast  Result Date: 01/12/2017 CLINICAL DATA:  Right-sided weakness.  Right-sided gaze. EXAM: CT PERFUSION BRAIN TECHNIQUE: Multiphase CT imaging of the brain was performed following IV bolus contrast injection. Subsequent parametric perfusion maps were calculated using RAPID software. CONTRAST:  59mL ISOVUE-370 IOPAMIDOL (ISOVUE-370) INJECTION 76% COMPARISON:  CT head without contrast from the same day. FINDINGS: CT Brain Perfusion Findings: CBF (<30%) Volume: 28mL Perfusion (Tmax>6.0s) volume: 61mL Mismatch Volume: 66mL Infarction Location:Posterior left frontal lobe along the primary motor cortex IMPRESSION: Area of focal ischemia along the posterior left frontal lobe at the primary motor cortex without evidence for infarct. Estimated volume is 4 mL. These results were called by telephone at the time of interpretation on 01/12/2017 at 1:39 pm to Dr. Amie Portland , who verbally acknowledged these results. Electronically Signed   By: San Morelle M.D.   On: 01/12/2017 13:54   Mr Jodene Nam Head Wo Contrast  Result Date: 01/12/2017 CLINICAL DATA:  Stroke.  Right-sided weakness facial droop EXAM: MRI HEAD WITHOUT CONTRAST MRA HEAD WITHOUT CONTRAST TECHNIQUE: Multiplanar, multiecho pulse sequences of the brain and surrounding structures were obtained without intravenous contrast. Angiographic images of the head were obtained using MRA  technique without contrast. COMPARISON:  CT perfusion 01/12/2017 FINDINGS: MRI HEAD FINDINGS Brain: Acute infarct in the left posterior insular cortex and left parietal white matter. Probable mild associated hemorrhage in the left insular infarct. Moderate chronic microvascular ischemic changes in the white matter. Chronic ischemia in the right basal ganglia including probable chronic infarct in the right internal capsule. Negative for mass lesion. Moderate atrophy. Vascular: Normal arterial flow voids. Skull and upper cervical spine: Negative Sinuses/Orbits: Small air-fluid levels maxillary sinus bilaterally. Mucosal edema paranasal sinuses. Bilateral lens replacement Other: None MRA HEAD FINDINGS Right vertebral artery dominant and widely patent to the basilar. Small left vertebral artery contributes to the basilar. PICA patent bilaterally. Basilar patent and tortuous. Superior cerebellar and posterior cerebral arteries patent bilaterally. Fetal origin right posterior cerebral artery. Internal carotid artery widely patent bilaterally. Anterior and middle cerebral artery patent without stenosis or occlusion. IMPRESSION: Small acute infarct in the left posterior insula with associated hemorrhage. Acute infarct in the left parietal white matter Atrophy and moderate chronic ischemic changes. Negative MRA head Electronically Signed   By: Franchot Gallo M.D.   On: 01/12/2017 21:35   Ct Head Code Stroke Wo Contrast  Result Date: 01/12/2017 CLINICAL DATA:  Code stroke. Code stroke. Right-sided weakness. Right-sided gaze. EXAM: CT HEAD WITHOUT CONTRAST TECHNIQUE: Contiguous axial images were obtained from the base of the skull through the vertex without intravenous contrast. COMPARISON:  MRI brain 07/20/2010 FINDINGS: Brain: Extensive periventricular and subcortical white matter disease has progressed bilaterally. No acute cortical infarct is present. Basal ganglia are intact bilaterally. Insular ribbon is normal.  White matter changes extend into the brainstem. The cerebellum is unremarkable. Vascular: Vascular calcifications are present in the cavernous internal carotid artery is bilaterally and at the dural margin of the right vertebral artery. There is no hyperdense vessel.  Skull: The calvarium is intact. Study is mildly degraded by patient motion. Sinuses/Orbits: Bilateral maxillary sinus surgery is noted. Small fluid levels are present in the maxillary sinuses bilaterally. Ethmoidectomies are present. Ostium is present in the inferior left frontal sinus. Minimal fluid is present in the right sphenoid sinus. The mastoid air cells are clear bilaterally. Bilateral lens replacements are present. Globes and orbits are within normal limits. ASPECTS [facility] Stroke Program Early CT Score) - Ganglionic level infarction (caudate, lentiform nuclei, internal capsule, insula, M1-M3 cortex): 7/7 - Supraganglionic infarction (M4-M6 cortex): 3/3 Total score (0-10 with 10 being normal): 10/10 IMPRESSION: 1. No acute intracranial abnormality 2. Progressive diffuse white matter disease compatible with chronic microvascular ischemia 3. ASPECTS is 10/10 The above was relayed via text pager to Dr. Rory Percy On 01/12/2017 at 13:27 . Electronically Signed   By: San Morelle M.D.   On: 01/12/2017 13:28     Scheduled Meds: . aspirin  325 mg Oral Daily  . donepezil  10 mg Oral QHS  . enoxaparin (LOVENOX) injection  30 mg Subcutaneous Q24H  . gabapentin  300 mg Oral BID  . levothyroxine  62.5 mcg Oral QAC breakfast  . losartan  25 mg Oral Daily  . memantine  21 mg Oral Daily  . metoprolol succinate  50 mg Oral Daily  . mometasone-formoterol  2 puff Inhalation BID  . simvastatin  40 mg Oral QHS   Continuous Infusions: . sodium chloride 50 mL/hr at 01/12/17 2258     LOS: 2 days   Time Spent in minutes   30 minutes  Khloei Spiker D.O. on 01/14/2017 at 10:03 AM  Between 7am to 7pm - Pager - [phone]  After 7pm go  to [email or web address] - password [email or web address]  And look for the night coverage person covering for me after hours  [facility] Office  [phone]

## 2017-01-14 NOTE — NC FL2 (Signed)
[place] LEVEL OF CARE SCREENING TOOL     IDENTIFICATION  Patient Name: Joanna [name] Birthdate: 08-30-1928 Sex: female Admission Date (Current Location): 01/12/2017  [facility] and [place] Number:  Herbalist and Address:  [facility] [place] [place], [place] [address] [place], [place] [address]      Provider Number: [id number]  Attending Physician Name and Address:  Cristal Ford, DO  Relative Name and Phone Number:       Current Level of Care: Hospital Recommended Level of Care: [place] Prior Approval Number:    Date Approved/Denied:   PASRR Number: [id number]  Discharge Plan: SNF    Current Diagnoses: Patient Active Problem List   Diagnosis Date Noted  . CVA (cerebral vascular accident) ([place]) 01/12/2017  . Dementia 01/12/2017  . Medication management 09/24/2014  . Urinary incontinence 12/09/2012  . Pain in joint, upper arm 10/30/2012  . Hereditary and idiopathic peripheral neuropathy 08/08/2012  . Extrinsic asthma 07/28/2012  . Pure hypercholesterolemia 07/28/2012  . Iron deficiency anemia, unspecified 06/26/2012  . Unspecified constipation 06/23/2012  . Osteoporosis 05/08/2012  . Closed left hip fracture ([place]) 05/04/2012  . Fall due to stumbling 05/04/2012  . Anemia 05/04/2012  . Bronchiectasis without acute exacerbation ([place]) 04/20/2010  . SINUSITIS, CHRONIC 02/03/2007  . 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 Essential hypertension 01/01/2007  . ALLERGIC RHINITIS 01/01/2007  . Chronic obstructive airway disease with asthma ([place]) 01/01/2007  . GERD 01/01/2007  . Irritable bowel syndrome 01/01/2007  . DIVERTICULOSIS, COLON 10/07/2003    Orientation RESPIRATION BLADDER Height & Weight     Self  Normal Continent Weight: 125 lb 0 oz (56.7 kg) Height:     BEHAVIORAL SYMPTOMS/MOOD NEUROLOGICAL BOWEL NUTRITION STATUS      Continent Diet(heart healthy)  AMBULATORY STATUS COMMUNICATION OF NEEDS Skin    Extensive Assist Verbally Normal                       Personal Care Assistance Level of Assistance  Bathing, Feeding, Dressing Bathing Assistance: Maximum assistance Feeding assistance: Limited assistance Dressing Assistance: Maximum assistance     Functional Limitations Info  Sight, Hearing, Speech Sight Info: Adequate Hearing Info: Adequate Speech Info: Adequate    SPECIAL CARE FACTORS FREQUENCY  PT (By licensed PT), OT (By licensed OT)     PT Frequency: 5x/wk OT Frequency: 5x/wk            Contractures Contractures Info: Not present    Additional Factors Info  Code Status, Allergies Code Status Info: Full Allergies Info: Cefuroxime Axetil, Lactose Intolerance (Gi), Clarithromycin, Codeine, Sulfonamide Derivatives           Current Medications (01/14/2017):  This is the current hospital active medication list Current Facility-Administered Medications  Medication Dose Route Frequency Provider Last Rate Last Dose  . 0.9 %  sodium chloride infusion   Intravenous Continuous Eulogio Bear U, DO 50 mL/hr at 01/12/17 2258    . acetaminophen (TYLENOL) tablet 650 mg  650 mg Oral Q4H PRN Eulogio Bear U, DO       Or  . acetaminophen (TYLENOL) solution 650 mg  650 mg Per Tube Q4H PRN Eulogio Bear U, DO       Or  . acetaminophen (TYLENOL) suppository 650 mg  650 mg Rectal Q4H PRN Eliseo Squires, Jessica U, DO      . albuterol (PROVENTIL) (2.5 MG/3ML) 0.083% nebulizer solution 3 mL  3 mL Inhalation Q6H PRN Eulogio Bear  U, DO      . aspirin tablet 325 mg  325 mg Oral Daily Eulogio Bear U, DO   325 mg at 01/13/17 1115  . donepezil (ARICEPT) tablet 10 mg  10 mg Oral QHS Rosalin Hawking, MD      . enoxaparin (LOVENOX) injection 30 mg  30 mg Subcutaneous Q24H Vann, Jessica U, DO   30 mg at 01/12/17 2255  . gabapentin (NEURONTIN) capsule 300 mg  300 mg Oral BID Rosalin Hawking, MD   300 mg at 01/13/17 1115  . levothyroxine (SYNTHROID, LEVOTHROID) tablet 62.5 mcg  62.5 mcg Oral QAC  breakfast Eulogio Bear U, DO   62.5 mcg at 01/13/17 5956  . losartan (COZAAR) tablet 25 mg  25 mg Oral Daily Rosalin Hawking, MD   25 mg at 01/13/17 1115  . memantine (NAMENDA XR) 24 hr capsule 21 mg  21 mg Oral Daily Vann, Jessica U, DO   21 mg at 01/13/17 1114  . metoprolol succinate (TOPROL-XL) 24 hr tablet 50 mg  50 mg Oral Daily Vann, Jessica U, DO   50 mg at 01/13/17 1115  . mometasone-formoterol (DULERA) 200-5 MCG/ACT inhaler 2 puff  2 puff Inhalation BID Eulogio Bear U, DO   2 puff at 01/14/17 0858  . senna-docusate (Senokot-S) tablet 1 tablet  1 tablet Oral QHS PRN Eulogio Bear U, DO      . simvastatin (ZOCOR) tablet 40 mg  40 mg Oral QHS Rosalin Hawking, MD         Discharge Medications: Please see discharge summary for a list of discharge medications.  Relevant Imaging Results:  Relevant Lab Results:   Additional Information SS#: [id number]  Geralynn Ochs, LCSW

## 2017-01-14 NOTE — Progress Notes (Signed)
  Echocardiogram 2D Echocardiogram has been performed.  Joanna [name] T Joanna [name] 01/14/2017, 11:01 AM

## 2017-01-15 MED ORDER — PHENOL 1.4 % MT LIQD
1.0000 | OROMUCOSAL | Status: DC | PRN
  Filled 2017-01-15: qty 177

## 2017-01-15 NOTE — Progress Notes (Signed)
PROGRESS NOTE    Joanna [name]  WNU:[id number] DOB: [birth year] DOA: 01/12/2017 PCP: Lauree Chandler, NP   Chief Complaint  Patient presents with  . Code Stroke    Brief Narrative:  84 y.o. female with medical history significant of dementia, HTN, HLD who lives at home with her son.  She was brought in for evaluation of right sided weakness and some right sided gaze preference. Admitted for CVA. Pending SNF.  Assessment & Plan   Acute CVA -Presented with right-sided weakness -CT head showed no acute intracranial abnormality -CTA Head: Asymmetric attenuation of left MCA branch vessels without a significant proximal stenosis or occlusion -MRI brain: Small acute infarct in the left posterior insula with associated hemorrhage. Acute infarct in the left parietal white matter Atrophy and moderate chronic ischemic changes -Echocardiogram done and pending  -LDL 99, hemoglobin A1c 5.5 -Neurology consulted and appreciated. Recommended continuing Aspirin 325mg  and statin daily at discharge, 30 day event monitor to rule out AF -PT/OT recommended SNF -Social work consulted for SNF placement -Continue aspirin and statin  Essential hypertension -Allow for permissive hypertension given acute CVA -Patient currently on metoprolol and losartan  Hyperlipidemia -lipid panel: TC 24, HDL 64, LDL 99, triglycerides 106 -Continue statin  Dementia -Continue Namenda, Aricept  Hypothyroidism -Continue Synthroid  DVT Prophylaxis  Lovenox  Code Status: Full  Family Communication: None at bedside  Disposition Plan: admitted, pending echocardiogram read, will need SNF at discharge- possibly 01/16/2017  Consultants Neurology  Procedures  Echocardiogram  Antibiotics   Anti-infectives (From admission, onward)   None      Subjective:   Joanell [name] seen and examined today.  Feeling somewhat better today. Denies chest pain, shortness of breath, abdominal pain, dizziness, headache. Has  dementia.  Objective:   Vitals:   01/15/17 0225 01/15/17 0532 01/15/17 0859 01/15/17 1026  BP: (!) 150/59 (!) 185/64  (!) 144/55  Pulse: 63 61  71  Resp: 20 18  17   Temp: 98.8 F (37.1 C) 98.6 F (37 C)  98.6 F (37 C)  TempSrc: Oral Oral  Oral  SpO2: 98% 99% 98% 99%  Weight:        Intake/Output Summary (Last 24 hours) at 01/15/2017 1148 Last data filed at 01/15/2017 0347 Gross per 24 hour  Intake 1192.5 ml  Output -  Net 1192.5 ml   Filed Weights   01/12/17 1350  Weight: 56.7 kg (125 lb 0 oz)    Exam  General: Well developed, elderly, NAD  HEENT: NCAT, mucous membranes moist.   Cardiovascular: S1 S2 auscultated, no murmur, RRR  Respiratory: Clear, no wheezing or rales  Abdomen: Soft, nontender, nondistended, + bowel sounds  Extremities: warm dry without cyanosis clubbing or edema  Neuro: AAOx2 (self only), with dementia. RUE weakness, RLE weakness  Skin: Without rashes exudates or nodules, multiple areas of bruising  Psych: appropriate mood and affect, pleasant   Data Reviewed: I have personally reviewed following labs and imaging studies  CBC: Recent Labs  Lab 01/12/17 1305 01/12/17 1314  WBC 8.7  --   NEUTROABS 5.3  --   HGB 12.2 12.2  HCT 37.8 36.0  MCV 89.2  --   PLT 229  --    Basic Metabolic Panel: Recent Labs  Lab 01/12/17 1305 01/12/17 1314  NA 139 144  K 3.6 3.6  CL 108 109  CO2 23  --   GLUCOSE 104* 101*  BUN 23* 24*  CREATININE 1.04* 1.00  CALCIUM 8.9  --  GFR: Estimated Creatinine Clearance: 36.1 mL/min (by C-G formula based on SCr of 1 mg/dL). Liver Function Tests: Recent Labs  Lab 01/12/17 1305  AST 25  ALT 12*  ALKPHOS 50  BILITOT 0.8  PROT 6.3*  ALBUMIN 3.7   No results for input(s): LIPASE, AMYLASE in the last 168 hours. No results for input(s): AMMONIA in the last 168 hours. Coagulation Profile: Recent Labs  Lab 01/12/17 1305  INR 1.00   Cardiac Enzymes: No results for input(s): CKTOTAL, CKMB,  CKMBINDEX, TROPONINI in the last 168 hours. BNP (last 3 results) No results for input(s): PROBNP in the last 8760 hours. HbA1C: Recent Labs    01/13/17 0433  HGBA1C 5.5   CBG: Recent Labs  Lab 01/12/17 1558  GLUCAP 89   Lipid Profile: Recent Labs    01/13/17 0433  CHOL 184  HDL 64  LDLCALC 99  TRIG 106  CHOLHDL 2.9   Thyroid Function Tests: No results for input(s): TSH, T4TOTAL, FREET4, T3FREE, THYROIDAB in the last 72 hours. Anemia Panel: No results for input(s): VITAMINB12, FOLATE, FERRITIN, TIBC, IRON, RETICCTPCT in the last 72 hours. Urine analysis:    Component Value Date/Time   COLORURINE YELLOW 02/10/2016 [place] 02/10/2016 1519   LABSPEC 1.006 02/10/2016 1519   PHURINE 7.0 02/10/2016 1519   GLUCOSEU NEGATIVE 02/10/2016 1519   GLUCOSEU NEGATIVE 04/19/2015 1335   HGBUR NEGATIVE 02/10/2016 1519   BILIRUBINUR NEGATIVE 02/10/2016 1519   KETONESUR NEGATIVE 02/10/2016 1519   PROTEINUR NEGATIVE 02/10/2016 1519   UROBILINOGEN 0.2 04/19/2015 1335   NITRITE NEGATIVE 02/10/2016 1519   LEUKOCYTESUR NEGATIVE 02/10/2016 1519   Sepsis Labs: @LABRCNTIP (procalcitonin:4,lacticidven:4)  )No results found for this or any previous visit (from the past 240 hour(s)).    Radiology Studies: No results found.   Scheduled Meds: . aspirin  325 mg Oral Daily  . donepezil  10 mg Oral QHS  . enoxaparin (LOVENOX) injection  30 mg Subcutaneous Q24H  . gabapentin  300 mg Oral BID  . levothyroxine  62.5 mcg Oral QAC breakfast  . losartan  25 mg Oral Daily  . memantine  21 mg Oral Daily  . metoprolol succinate  50 mg Oral Daily  . mometasone-formoterol  2 puff Inhalation BID  . simvastatin  40 mg Oral QHS   Continuous Infusions: . sodium chloride 50 mL/hr at 01/14/17 1900     LOS: 3 days   Time Spent in minutes   30 minutes  Javeah Loeza D.O. on 01/15/2017 at 11:48 AM  Between 7am to 7pm - Pager - [phone]  After 7pm go to [email or web address] -  password [email or web address]  And look for the night coverage person covering for me after hours  [facility] Office  [phone]

## 2017-01-16 DIAGNOSIS — R05 Cough: Secondary | ICD-10-CM | POA: Diagnosis not present

## 2017-01-16 DIAGNOSIS — R41841 Cognitive communication deficit: Secondary | ICD-10-CM | POA: Diagnosis not present

## 2017-01-16 DIAGNOSIS — S299XXA Unspecified injury of thorax, initial encounter: Secondary | ICD-10-CM | POA: Diagnosis not present

## 2017-01-16 DIAGNOSIS — E7800 Pure hypercholesterolemia, unspecified: Secondary | ICD-10-CM | POA: Diagnosis not present

## 2017-01-16 DIAGNOSIS — W19XXXA Unspecified fall, initial encounter: Secondary | ICD-10-CM | POA: Diagnosis not present

## 2017-01-16 DIAGNOSIS — G309 Alzheimer's disease, unspecified: Secondary | ICD-10-CM | POA: Diagnosis not present

## 2017-01-16 DIAGNOSIS — Y92129 Unspecified place in nursing home as the place of occurrence of the external cause: Secondary | ICD-10-CM | POA: Diagnosis not present

## 2017-01-16 DIAGNOSIS — E876 Hypokalemia: Secondary | ICD-10-CM | POA: Diagnosis not present

## 2017-01-16 DIAGNOSIS — I63412 Cerebral infarction due to embolism of left middle cerebral artery: Secondary | ICD-10-CM | POA: Diagnosis not present

## 2017-01-16 DIAGNOSIS — N390 Urinary tract infection, site not specified: Secondary | ICD-10-CM | POA: Diagnosis not present

## 2017-01-16 DIAGNOSIS — S1093XA Contusion of unspecified part of neck, initial encounter: Secondary | ICD-10-CM | POA: Diagnosis not present

## 2017-01-16 DIAGNOSIS — R2681 Unsteadiness on feet: Secondary | ICD-10-CM | POA: Diagnosis not present

## 2017-01-16 DIAGNOSIS — E039 Hypothyroidism, unspecified: Secondary | ICD-10-CM

## 2017-01-16 DIAGNOSIS — J449 Chronic obstructive pulmonary disease, unspecified: Secondary | ICD-10-CM | POA: Diagnosis not present

## 2017-01-16 DIAGNOSIS — T148XXA Other injury of unspecified body region, initial encounter: Secondary | ICD-10-CM | POA: Diagnosis not present

## 2017-01-16 DIAGNOSIS — L299 Pruritus, unspecified: Secondary | ICD-10-CM | POA: Diagnosis not present

## 2017-01-16 DIAGNOSIS — I619 Nontraumatic intracerebral hemorrhage, unspecified: Secondary | ICD-10-CM | POA: Diagnosis not present

## 2017-01-16 DIAGNOSIS — J45909 Unspecified asthma, uncomplicated: Secondary | ICD-10-CM | POA: Diagnosis not present

## 2017-01-16 DIAGNOSIS — Z7982 Long term (current) use of aspirin: Secondary | ICD-10-CM | POA: Diagnosis not present

## 2017-01-16 DIAGNOSIS — I10 Essential (primary) hypertension: Secondary | ICD-10-CM | POA: Diagnosis not present

## 2017-01-16 DIAGNOSIS — M549 Dorsalgia, unspecified: Secondary | ICD-10-CM | POA: Diagnosis not present

## 2017-01-16 DIAGNOSIS — S300XXA Contusion of lower back and pelvis, initial encounter: Secondary | ICD-10-CM | POA: Diagnosis not present

## 2017-01-16 DIAGNOSIS — I679 Cerebrovascular disease, unspecified: Secondary | ICD-10-CM | POA: Diagnosis not present

## 2017-01-16 DIAGNOSIS — R4182 Altered mental status, unspecified: Secondary | ICD-10-CM | POA: Diagnosis not present

## 2017-01-16 DIAGNOSIS — S199XXA Unspecified injury of neck, initial encounter: Secondary | ICD-10-CM | POA: Diagnosis not present

## 2017-01-16 DIAGNOSIS — M6281 Muscle weakness (generalized): Secondary | ICD-10-CM | POA: Diagnosis not present

## 2017-01-16 DIAGNOSIS — F0390 Unspecified dementia without behavioral disturbance: Secondary | ICD-10-CM | POA: Diagnosis not present

## 2017-01-16 DIAGNOSIS — S0990XA Unspecified injury of head, initial encounter: Secondary | ICD-10-CM | POA: Diagnosis not present

## 2017-01-16 DIAGNOSIS — T07XXXA Unspecified multiple injuries, initial encounter: Secondary | ICD-10-CM | POA: Diagnosis not present

## 2017-01-16 DIAGNOSIS — R278 Other lack of coordination: Secondary | ICD-10-CM | POA: Diagnosis not present

## 2017-01-16 DIAGNOSIS — Z8673 Personal history of transient ischemic attack (TIA), and cerebral infarction without residual deficits: Secondary | ICD-10-CM | POA: Diagnosis not present

## 2017-01-16 DIAGNOSIS — Y939 Activity, unspecified: Secondary | ICD-10-CM | POA: Diagnosis not present

## 2017-01-16 DIAGNOSIS — M542 Cervicalgia: Secondary | ICD-10-CM | POA: Diagnosis not present

## 2017-01-16 DIAGNOSIS — R260 Ataxic gait: Secondary | ICD-10-CM | POA: Diagnosis not present

## 2017-01-16 DIAGNOSIS — S3992XA Unspecified injury of lower back, initial encounter: Secondary | ICD-10-CM | POA: Diagnosis not present

## 2017-01-16 DIAGNOSIS — R7989 Other specified abnormal findings of blood chemistry: Secondary | ICD-10-CM | POA: Diagnosis not present

## 2017-01-16 DIAGNOSIS — F0280 Dementia in other diseases classified elsewhere without behavioral disturbance: Secondary | ICD-10-CM | POA: Diagnosis not present

## 2017-01-16 DIAGNOSIS — J209 Acute bronchitis, unspecified: Secondary | ICD-10-CM | POA: Diagnosis not present

## 2017-01-16 DIAGNOSIS — Y999 Unspecified external cause status: Secondary | ICD-10-CM | POA: Diagnosis not present

## 2017-01-16 DIAGNOSIS — E785 Hyperlipidemia, unspecified: Secondary | ICD-10-CM | POA: Diagnosis not present

## 2017-01-16 DIAGNOSIS — I639 Cerebral infarction, unspecified: Secondary | ICD-10-CM | POA: Diagnosis not present

## 2017-01-16 DIAGNOSIS — G4700 Insomnia, unspecified: Secondary | ICD-10-CM | POA: Diagnosis not present

## 2017-01-16 LAB — URINALYSIS, ROUTINE W REFLEX MICROSCOPIC
Bilirubin Urine: NEGATIVE
Glucose, UA: NEGATIVE mg/dL
Hgb urine dipstick: NEGATIVE
Ketones, ur: NEGATIVE mg/dL
Leukocytes, UA: NEGATIVE
Nitrite: NEGATIVE
Protein, ur: NEGATIVE mg/dL
Specific Gravity, Urine: 1.01 (ref 1.005–1.030)
pH: 6 (ref 5.0–8.0)

## 2017-01-16 MED ORDER — ASPIRIN 325 MG PO TABS: 325.0000 mg | ORAL_TABLET | Freq: Every day | ORAL | 0 refills | Status: AC

## 2017-01-16 MED ORDER — SIMVASTATIN 40 MG PO TABS: 40.0000 mg | ORAL_TABLET | Freq: Every day | ORAL | 0 refills | Status: DC

## 2017-01-16 NOTE — Progress Notes (Signed)
Physical Therapy Treatment Patient Details Name: Joanna [name] MRN: [id number] DOB: [birth year] Today's Date: 01/16/2017    History of Present Illness 84 y.o. female with medical history significant of dementia, HTN, HLD who lives at home with her son.  She was brought in for evaluation of right sided weakness and some right sided gaze preference    PT Comments    Patient progressing well towards PT goals. Improved ambulation distance today with Min A for balance/safety with constant cues to stay within RW for support. Pt perseverates on wanting to find her shoes but able to be redirected at times. Demonstrates decreased foot clearance RLE. Plans to d/c to SNF. Will follow and progress as tolerated.     Follow Up Recommendations  SNF;Supervision/Assistance - 24 hour     Equipment Recommendations  None recommended by PT    Recommendations for Other Services       Precautions / Restrictions Precautions Precautions: Fall Restrictions Weight Bearing Restrictions: No    Mobility  Bed Mobility               General bed mobility comments: Up in chair upon PT arrival.   Transfers Overall transfer level: Needs assistance Equipment used: Rolling walker (2 wheeled) Transfers: Sit to/from Stand Sit to Stand: Min assist         General transfer comment: Assist to power to standing with cues for hand placement/technique. Stood from Youth worker.   Ambulation/Gait Ambulation/Gait assistance: Min assist Ambulation Distance (Feet): 70 Feet Assistive device: Rolling walker (2 wheeled) Gait Pattern/deviations: Decreased stride length;Shuffle;Step-through pattern;Decreased dorsiflexion - right;Trunk flexed;Narrow base of support Gait velocity: decreased   General Gait Details: Very slow, unsteady gait with constant cues for RW proximity as pt tends to walk with RW too far anterior. Decreased foot clearance RLE.   Stairs            Wheelchair Mobility    Modified Rankin  (Stroke Patients Only) Modified Rankin (Stroke Patients Only) Pre-Morbid Rankin Score: Moderate disability Modified Rankin: Moderately severe disability     Balance Overall balance assessment: Needs assistance Sitting-balance support: Feet supported;No upper extremity supported Sitting balance-Leahy Scale: Fair Sitting balance - Comments: Able to reach outside BoS and donn/doff socks without difficulty.    Standing balance support: During functional activity Standing balance-Leahy Scale: Poor Standing balance comment: Requires BUE support on RW for safety.                             Cognition Arousal/Alertness: Awake/alert Behavior During Therapy: WFL for tasks assessed/performed Overall Cognitive Status: No family/caregiver present to determine baseline cognitive functioning                                 General Comments: Hx of dementia, only oriented to self currently. Perseverates on needing to find her shoes. Able to be redirected at times.       Exercises      General Comments        Pertinent Vitals/Pain Pain Assessment: Faces Faces Pain Scale: Hurts little more Pain Location: bil feet Pain Descriptors / Indicators: Sore Pain Intervention(s): Monitored during session;Repositioned    Home Living                      Prior Function            PT Goals (current  goals can now be found in the care plan section) Progress towards PT goals: Progressing toward goals    Frequency    Min 2X/week      PT Plan Current plan remains appropriate    Co-evaluation              AM-PAC PT "6 Clicks" Daily Activity  Outcome Measure  Difficulty turning over in bed (including adjusting bedclothes, sheets and blankets)?: None Difficulty moving from lying on back to sitting on the side of the bed? : None Difficulty sitting down on and standing up from a chair with arms (e.g., wheelchair, bedside commode, etc,.)?: Unable Help  needed moving to and from a bed to chair (including a wheelchair)?: A Little Help needed walking in hospital room?: A Little Help needed climbing 3-5 steps with a railing? : A Lot 6 Click Score: 17    End of Session Equipment Utilized During Treatment: Gait belt Activity Tolerance: Patient tolerated treatment well Patient left: in chair;with call bell/phone within reach;with chair alarm set Nurse Communication: Mobility status PT Visit Diagnosis: Difficulty in walking, not elsewhere classified (R26.2)     Time: 5681-2751 PT Time Calculation (min) (ACUTE ONLY): 19 min  Charges:  $Gait Training: 8-22 mins                    G Codes:       Wray Kearns, PT, DPT [place] 01/16/2017, 12:22 PM

## 2017-01-16 NOTE — Discharge Summary (Signed)
Physician Discharge Summary  Joanna [name] LKG:[id number] DOB: 09/11/1928 DOA: 01/12/2017  PCP: Lauree Chandler, NP  Admit date: 01/12/2017 Discharge date: 01/16/2017  Time spent: 45 minutes  Recommendations for Outpatient Follow-up:  Patient will be discharged to [place] facility. Continue physical, occupational, speech therapy.  Patient will need to follow up with primary care provider within one week of discharge.  Follow up with neurology in 6 weeks. Cardiology will contact you for an event monitor. Patient should continue medications as prescribed.  Patient should follow a heart healthy diet.   Discharge Diagnoses:  Acute CVA Essential hypertension Hyperlipidemia Dementia Hypothyroidism  Discharge Condition: Stable  Diet recommendation: heart healthy  Filed Weights   01/12/17 1350  Weight: 56.7 kg (125 lb 0 oz)    History of present illness:  On 01/12/2017 by Dr. Eulogio Bear Joanna [name] is a 84 y.o. female with medical history significant of dementia, HTN, HLD who lives at home with her son.  She was brought in for evaluation of right sided weakness and some right sided gaze preference.  Per EMS report, son saw her normal last PM around 7:30.  This AM when she got up she had the arm/leg weakness on the right.   No fever, no chills, no dysuria.  Patient denies recent falls but has multiple areas of bruising on body.    Hospital Course:  Acute CVA -Presented with right-sided weakness -CT head showed no acute intracranial abnormality -CTA Head: Asymmetric attenuation of left MCA branch vessels without a significant proximal stenosis or occlusion -MRI brain: Small acute infarct in the left posterior insula with associated hemorrhage. Acute infarct in the left parietal white matter Atrophy and moderate chronic ischemic changes -Echocardiogram done and pending  -LDL 99, hemoglobin A1c 5.5 -Neurology consulted and appreciated. Recommended continuing  Aspirin 325mg  and statin daily at discharge, 30 day event monitor to rule out AF -PT/OT recommended SNF -Social work consulted for SNF placement -Continue aspirin and statin  Essential hypertension -Allow for permissive hypertension given acute CVA -Patient currently on metoprolol and losartan  Hyperlipidemia -lipid panel: TC 24, HDL 64, LDL 99, triglycerides 106 -Continue statin  Dementia -Continue Namenda, Aricept  Hypothyroidism -Continue Synthroid  Consultants Neurology  Procedures  Echocardiogram  Discharge Exam: Vitals:   01/16/17 0858 01/16/17 0859  BP: (!) 136/51   Pulse: 66   Resp: 20   Temp: 98.3 F (36.8 C)   SpO2: 97% 97%     General: Well developed, elderly, NAD  HEENT: NCAT, mucous membranes moist.  Cardiovascular: S1 S2 auscultated, no murmur, RRR  Respiratory: Clear to auscultation bilaterally with equal chest rise  Abdomen: Soft, nontender, nondistended, + bowel sounds  Extremities: warm dry without cyanosis clubbing or edema  Neuro: AAOx2 (self and place), has dementia, right sided weakness  Discharge Instructions Discharge Instructions    Ambulatory referral to Neurology   Complete by:  As directed    Follow up with stroke clinic Cecille Rubin preferred, if not available, then consider Caesar Chestnut, Leta Baptist or Jaynee Eagles whoever is available) at [facility] in about 6-8 weeks. Thanks.   Discharge instructions   Complete by:  As directed    Patient will be discharged to [place] facility. Continue physical, occupational, speech therapy.  Patient will need to follow up with primary care provider within one week of discharge.  Follow up with neurology in 6 weeks. Cardiology will contact you for an event monitor. Patient should continue medications as prescribed.  Patient  should follow a heart healthy diet.     Allergies as of 01/16/2017      Reactions   Cefuroxime Axetil Hives   Not sure.. Patient can be allergic to it today but a  year from now she may not be.   Lactose Intolerance (gi) Other (See Comments)   Gi upset   Clarithromycin Palpitations   Codeine Palpitations   Sulfonamide Derivatives Palpitations      Medication List    TAKE these medications   acetaminophen 500 MG tablet Commonly known as:  TYLENOL Take 500 mg by mouth every 6 (six) hours as needed for mild pain.   albuterol 108 (90 Base) MCG/ACT inhaler Commonly known as:  PROVENTIL HFA;VENTOLIN HFA Inhale 2 puffs into the lungs every 6 (six) hours as needed for wheezing or shortness of breath.   aspirin 325 MG tablet Take 1 tablet (325 mg total) by mouth daily. Start taking on:  01/17/2017   donepezil 10 MG tablet Commonly known as:  ARICEPT Take 1 tablet (10 mg total) by mouth at bedtime. What changed:  when to take this   gabapentin 300 MG capsule Commonly known as:  NEURONTIN Take 1 capsule (300 mg total) by mouth 2 (two) times daily.   losartan 25 MG tablet Commonly known as:  COZAAR TAKE 1 TABLET BY MOUTH  DAILY What changed:    how much to take  how to take this  when to take this   magnesium hydroxide 400 MG/5ML suspension Commonly known as:  MILK OF MAGNESIA Take 5 mLs by mouth daily as needed for mild constipation.   Memantine HCl ER 21 MG Cp24 Commonly known as:  NAMENDA XR Take 1 tablet by mouth daily. What changed:  Another medication with the same name was removed. Continue taking this medication, and follow the directions you see here.   metoprolol succinate 50 MG 24 hr tablet Commonly known as:  TOPROL-XL Take 1 tablet (50 mg total) by mouth daily. Take with or immediately following a meal.   mometasone-formoterol 200-5 MCG/ACT Aero Commonly known as:  DULERA Inhale 2 puffs into the lungs 2 (two) times daily.   polyethylene glycol packet Commonly known as:  MIRALAX / GLYCOLAX Take 17 g by mouth daily.   simvastatin 40 MG tablet Commonly known as:  ZOCOR Take 1 tablet (40 mg total) by mouth at  bedtime. What changed:    medication strength  how much to take   SYNTHROID 125 MCG tablet Generic drug:  levothyroxine TAKE ONE-HALF TABLET BY  MOUTH EVERY MORNING What changed:    how much to take  how to take this  when to take this      Allergies  Allergen Reactions  . Cefuroxime Axetil Hives    Not sure.. Patient can be allergic to it today but a year from now she may not be.  . Lactose Intolerance (Gi) Other (See Comments)    Gi upset  . Clarithromycin Palpitations  . Codeine Palpitations  . Sulfonamide Derivatives Palpitations    Contact information for follow-up providers    Dennie Bible, NP. Schedule an appointment as soon as possible for a visit in 6 week(s).   Specialty:  Family Medicine Contact information: [address] [place] [address] [phone]        Lauree Chandler, NP. Schedule an appointment as soon as possible for a visit in 1 week(s).   Specialty:  Geriatric Medicine Why:  Hospital follow up Contact information:  [place]. [place] [address] [phone]            Contact information for after-discharge care    Destination    [facility] Follow up.   Service:  Skilled Nursing Contact information: [place] [phone]                   The results of significant diagnostics from this hospitalization (including imaging, microbiology, ancillary and laboratory) are listed below for reference.    Significant Diagnostic Studies: Ct Angio Head W Or Wo Contrast  Result Date: 01/12/2017 CLINICAL DATA:  Code stroke. Right-sided weakness. Right-sided gaze. EXAM: CT ANGIOGRAPHY HEAD AND NECK TECHNIQUE: Multidetector CT imaging of the head and neck was performed using the standard protocol during bolus administration of intravenous contrast. Multiplanar CT image reconstructions and MIPs were obtained to evaluate the  vascular anatomy. Carotid stenosis measurements (when applicable) are obtained utilizing NASCET criteria, using the distal internal carotid diameter as the denominator. CONTRAST:  92mL ISOVUE-370 IOPAMIDOL (ISOVUE-370) INJECTION 76% COMPARISON:  CT head without contrast from the same day. CT perfusion from the same day. FINDINGS: CTA NECK FINDINGS Aortic arch: A 3 vessel arch configuration is present. Dense atherosclerotic calcifications are present at the aortic arch without significant stenosis. There are calcifications associated with the origins the great vessels without significant stenosis. Right carotid system: The right common carotid artery demonstrates mild tortuosity. There is no significant stenosis. Right carotid bifurcation is intact. The cervical right ICA is mildly tortuous without significant stenosis. Left carotid system: Calcifications are present along the anterior and lateral wall of the distal left common carotid artery. There are calcifications at the carotid bifurcation. No significant stenosis is present. Moderate tortuosity is present in the cervical left ICA without significant stenosis. Vertebral arteries: The vertebral artery is originate from the subclavian artery is bilaterally without significant stenosis. There is calcification of the origins of both vertebral arteries. Left vertebral artery is dominant. No focal stenosis is present in either vertebral artery in the neck. Skeleton: Levoconvex curvature is present in the upper thoracic spine. Multilevel endplate changes are present from C3-4 through the cervicothoracic junction. Vertebral body heights are maintained. Facet hypertrophy contributes to multilevel foraminal narrowing. No focal lytic or blastic lesions are present. Other neck: Heterogeneous thyroid is present with multiple subcentimeter nodules in inferior extension on the left. No significant adenopathy is present. Salivary glands are within normal limits. Upper chest:  Mild dependent atelectasis is present in the upper lobes bilaterally. No focal nodule, mass, or airspace disease is present. The thoracic inlet is within limits. Superior mediastinum is otherwise unremarkable. Review of the MIP images confirms the above findings CTA HEAD FINDINGS Anterior circulation: Atherosclerotic calcifications are present within the cavernous internal carotid artery is bilaterally without significant stenosis relative to the more distal vessels. The ICA termini are within normal limits. The right A1 segment is hypoplastic compared to the left. The anterior communicating artery is patent. The left A1 segment above the M1 segments are normal. The MCA bifurcations are intact. There is asymmetric attenuation of MCA branch vessels on the left without a focal stenosis or occlusion. The bilateral ACA and right MCA branch vessels are within normal limits. Posterior circulation: Left vertebral artery is the dominant vessel. PICA origins are visualized and normal bilaterally. Basilar artery is tortuous. A fetal type right posterior cerebral artery is present. The left posterior cerebral artery originates from a posterior communicating artery and  a left P1 segment. The PCA branch vessels demonstrate mild distal irregularity without a significant proximal stenosis or occlusion. Venous sinuses: The dural sinuses are patent. Left transverse sinus is patent. Straight sinus deep cerebral veins are intact. Cortical veins are unremarkable. Anatomic variants: Fetal type right posterior cerebral artery. Delayed phase: Not performed. Review of the MIP images confirms the above findings IMPRESSION: 1. Asymmetric attenuation of left MCA branch vessels without a significant proximal stenosis or occlusion. 2. Atherosclerotic calcifications at the left greater than right carotid bifurcation without significant stenosis. 3.  Aortic Atherosclerosis (ICD10-I70.0). 4. Mild distal small vessel disease without a significant  proximal stenosis, aneurysm, or branch vessel occlusion within the circle of Willis. These results were called by telephone at the time of interpretation on 01/12/2017 at 1:39 PM to Dr. Amie Portland , who verbally acknowledged these results. Electronically Signed   By: San Morelle M.D.   On: 01/12/2017 14:05   Ct Angio Neck W Or Wo Contrast  Result Date: 01/12/2017 CLINICAL DATA:  Code stroke. Right-sided weakness. Right-sided gaze. EXAM: CT ANGIOGRAPHY HEAD AND NECK TECHNIQUE: Multidetector CT imaging of the head and neck was performed using the standard protocol during bolus administration of intravenous contrast. Multiplanar CT image reconstructions and MIPs were obtained to evaluate the vascular anatomy. Carotid stenosis measurements (when applicable) are obtained utilizing NASCET criteria, using the distal internal carotid diameter as the denominator. CONTRAST:  28mL ISOVUE-370 IOPAMIDOL (ISOVUE-370) INJECTION 76% COMPARISON:  CT head without contrast from the same day. CT perfusion from the same day. FINDINGS: CTA NECK FINDINGS Aortic arch: A 3 vessel arch configuration is present. Dense atherosclerotic calcifications are present at the aortic arch without significant stenosis. There are calcifications associated with the origins the great vessels without significant stenosis. Right carotid system: The right common carotid artery demonstrates mild tortuosity. There is no significant stenosis. Right carotid bifurcation is intact. The cervical right ICA is mildly tortuous without significant stenosis. Left carotid system: Calcifications are present along the anterior and lateral wall of the distal left common carotid artery. There are calcifications at the carotid bifurcation. No significant stenosis is present. Moderate tortuosity is present in the cervical left ICA without significant stenosis. Vertebral arteries: The vertebral artery is originate from the subclavian artery is bilaterally without  significant stenosis. There is calcification of the origins of both vertebral arteries. Left vertebral artery is dominant. No focal stenosis is present in either vertebral artery in the neck. Skeleton: Levoconvex curvature is present in the upper thoracic spine. Multilevel endplate changes are present from C3-4 through the cervicothoracic junction. Vertebral body heights are maintained. Facet hypertrophy contributes to multilevel foraminal narrowing. No focal lytic or blastic lesions are present. Other neck: Heterogeneous thyroid is present with multiple subcentimeter nodules in inferior extension on the left. No significant adenopathy is present. Salivary glands are within normal limits. Upper chest: Mild dependent atelectasis is present in the upper lobes bilaterally. No focal nodule, mass, or airspace disease is present. The thoracic inlet is within limits. Superior mediastinum is otherwise unremarkable. Review of the MIP images confirms the above findings CTA HEAD FINDINGS Anterior circulation: Atherosclerotic calcifications are present within the cavernous internal carotid artery is bilaterally without significant stenosis relative to the more distal vessels. The ICA termini are within normal limits. The right A1 segment is hypoplastic compared to the left. The anterior communicating artery is patent. The left A1 segment above the M1 segments are normal. The MCA bifurcations are intact. There is asymmetric attenuation of MCA  branch vessels on the left without a focal stenosis or occlusion. The bilateral ACA and right MCA branch vessels are within normal limits. Posterior circulation: Left vertebral artery is the dominant vessel. PICA origins are visualized and normal bilaterally. Basilar artery is tortuous. A fetal type right posterior cerebral artery is present. The left posterior cerebral artery originates from a posterior communicating artery and a left P1 segment. The PCA branch vessels demonstrate mild  distal irregularity without a significant proximal stenosis or occlusion. Venous sinuses: The dural sinuses are patent. Left transverse sinus is patent. Straight sinus deep cerebral veins are intact. Cortical veins are unremarkable. Anatomic variants: Fetal type right posterior cerebral artery. Delayed phase: Not performed. Review of the MIP images confirms the above findings IMPRESSION: 1. Asymmetric attenuation of left MCA branch vessels without a significant proximal stenosis or occlusion. 2. Atherosclerotic calcifications at the left greater than right carotid bifurcation without significant stenosis. 3.  Aortic Atherosclerosis (ICD10-I70.0). 4. Mild distal small vessel disease without a significant proximal stenosis, aneurysm, or branch vessel occlusion within the circle of Willis. These results were called by telephone at the time of interpretation on 01/12/2017 at 1:39 PM to Dr. Amie Portland , who verbally acknowledged these results. Electronically Signed   By: San Morelle M.D.   On: 01/12/2017 14:05   Mr Brain Wo Contrast  Result Date: 01/12/2017 CLINICAL DATA:  Stroke.  Right-sided weakness facial droop EXAM: MRI HEAD WITHOUT CONTRAST MRA HEAD WITHOUT CONTRAST TECHNIQUE: Multiplanar, multiecho pulse sequences of the brain and surrounding structures were obtained without intravenous contrast. Angiographic images of the head were obtained using MRA technique without contrast. COMPARISON:  CT perfusion 01/12/2017 FINDINGS: MRI HEAD FINDINGS Brain: Acute infarct in the left posterior insular cortex and left parietal white matter. Probable mild associated hemorrhage in the left insular infarct. Moderate chronic microvascular ischemic changes in the white matter. Chronic ischemia in the right basal ganglia including probable chronic infarct in the right internal capsule. Negative for mass lesion. Moderate atrophy. Vascular: Normal arterial flow voids. Skull and upper cervical spine: Negative  Sinuses/Orbits: Small air-fluid levels maxillary sinus bilaterally. Mucosal edema paranasal sinuses. Bilateral lens replacement Other: None MRA HEAD FINDINGS Right vertebral artery dominant and widely patent to the basilar. Small left vertebral artery contributes to the basilar. PICA patent bilaterally. Basilar patent and tortuous. Superior cerebellar and posterior cerebral arteries patent bilaterally. Fetal origin right posterior cerebral artery. Internal carotid artery widely patent bilaterally. Anterior and middle cerebral artery patent without stenosis or occlusion. IMPRESSION: Small acute infarct in the left posterior insula with associated hemorrhage. Acute infarct in the left parietal white matter Atrophy and moderate chronic ischemic changes. Negative MRA head Electronically Signed   By: Franchot Gallo M.D.   On: 01/12/2017 21:35   Ct Cerebral Perfusion W Contrast  Result Date: 01/12/2017 CLINICAL DATA:  Right-sided weakness.  Right-sided gaze. EXAM: CT PERFUSION BRAIN TECHNIQUE: Multiphase CT imaging of the brain was performed following IV bolus contrast injection. Subsequent parametric perfusion maps were calculated using RAPID software. CONTRAST:  13mL ISOVUE-370 IOPAMIDOL (ISOVUE-370) INJECTION 76% COMPARISON:  CT head without contrast from the same day. FINDINGS: CT Brain Perfusion Findings: CBF (<30%) Volume: 39mL Perfusion (Tmax>6.0s) volume: 61mL Mismatch Volume: 63mL Infarction Location:Posterior left frontal lobe along the primary motor cortex IMPRESSION: Area of focal ischemia along the posterior left frontal lobe at the primary motor cortex without evidence for infarct. Estimated volume is 4 mL. These results were called by telephone at the time of interpretation on 01/12/2017  at 1:39 pm to Dr. Amie Portland , who verbally acknowledged these results. Electronically Signed   By: San Morelle M.D.   On: 01/12/2017 13:54   Mr Jodene Nam Head Wo Contrast  Result Date: 01/12/2017 CLINICAL DATA:   Stroke.  Right-sided weakness facial droop EXAM: MRI HEAD WITHOUT CONTRAST MRA HEAD WITHOUT CONTRAST TECHNIQUE: Multiplanar, multiecho pulse sequences of the brain and surrounding structures were obtained without intravenous contrast. Angiographic images of the head were obtained using MRA technique without contrast. COMPARISON:  CT perfusion 01/12/2017 FINDINGS: MRI HEAD FINDINGS Brain: Acute infarct in the left posterior insular cortex and left parietal white matter. Probable mild associated hemorrhage in the left insular infarct. Moderate chronic microvascular ischemic changes in the white matter. Chronic ischemia in the right basal ganglia including probable chronic infarct in the right internal capsule. Negative for mass lesion. Moderate atrophy. Vascular: Normal arterial flow voids. Skull and upper cervical spine: Negative Sinuses/Orbits: Small air-fluid levels maxillary sinus bilaterally. Mucosal edema paranasal sinuses. Bilateral lens replacement Other: None MRA HEAD FINDINGS Right vertebral artery dominant and widely patent to the basilar. Small left vertebral artery contributes to the basilar. PICA patent bilaterally. Basilar patent and tortuous. Superior cerebellar and posterior cerebral arteries patent bilaterally. Fetal origin right posterior cerebral artery. Internal carotid artery widely patent bilaterally. Anterior and middle cerebral artery patent without stenosis or occlusion. IMPRESSION: Small acute infarct in the left posterior insula with associated hemorrhage. Acute infarct in the left parietal white matter Atrophy and moderate chronic ischemic changes. Negative MRA head Electronically Signed   By: Franchot Gallo M.D.   On: 01/12/2017 21:35   Ct Head Code Stroke Wo Contrast  Result Date: 01/12/2017 CLINICAL DATA:  Code stroke. Code stroke. Right-sided weakness. Right-sided gaze. EXAM: CT HEAD WITHOUT CONTRAST TECHNIQUE: Contiguous axial images were obtained from the base of the skull  through the vertex without intravenous contrast. COMPARISON:  MRI brain 07/20/2010 FINDINGS: Brain: Extensive periventricular and subcortical white matter disease has progressed bilaterally. No acute cortical infarct is present. Basal ganglia are intact bilaterally. Insular ribbon is normal. White matter changes extend into the brainstem. The cerebellum is unremarkable. Vascular: Vascular calcifications are present in the cavernous internal carotid artery is bilaterally and at the dural margin of the right vertebral artery. There is no hyperdense vessel. Skull: The calvarium is intact. Study is mildly degraded by patient motion. Sinuses/Orbits: Bilateral maxillary sinus surgery is noted. Small fluid levels are present in the maxillary sinuses bilaterally. Ethmoidectomies are present. Ostium is present in the inferior left frontal sinus. Minimal fluid is present in the right sphenoid sinus. The mastoid air cells are clear bilaterally. Bilateral lens replacements are present. Globes and orbits are within normal limits. ASPECTS [facility] Stroke Program Early CT Score) - Ganglionic level infarction (caudate, lentiform nuclei, internal capsule, insula, M1-M3 cortex): 7/7 - Supraganglionic infarction (M4-M6 cortex): 3/3 Total score (0-10 with 10 being normal): 10/10 IMPRESSION: 1. No acute intracranial abnormality 2. Progressive diffuse white matter disease compatible with chronic microvascular ischemia 3. ASPECTS is 10/10 The above was relayed via text pager to Dr. Rory Percy On 01/12/2017 at 13:27 . Electronically Signed   By: San Morelle M.D.   On: 01/12/2017 13:28    Microbiology: No results found for this or any previous visit (from the past 240 hour(s)).   Labs: Basic Metabolic Panel: Recent Labs  Lab 01/12/17 1305 01/12/17 1314  NA 139 144  K 3.6 3.6  CL 108 109  CO2 23  --   GLUCOSE 104*  101*  BUN 23* 24*  CREATININE 1.04* 1.00  CALCIUM 8.9  --    Liver Function Tests: Recent Labs  Lab  01/12/17 1305  AST 25  ALT 12*  ALKPHOS 50  BILITOT 0.8  PROT 6.3*  ALBUMIN 3.7   No results for input(s): LIPASE, AMYLASE in the last 168 hours. No results for input(s): AMMONIA in the last 168 hours. CBC: Recent Labs  Lab 01/12/17 1305 01/12/17 1314  WBC 8.7  --   NEUTROABS 5.3  --   HGB 12.2 12.2  HCT 37.8 36.0  MCV 89.2  --   PLT 229  --    Cardiac Enzymes: No results for input(s): CKTOTAL, CKMB, CKMBINDEX, TROPONINI in the last 168 hours. BNP: BNP (last 3 results) No results for input(s): BNP in the last 8760 hours.  ProBNP (last 3 results) No results for input(s): PROBNP in the last 8760 hours.  CBG: Recent Labs  Lab 01/12/17 1558  GLUCAP 89       Signed:  Xcaret Morad  [facility] 01/16/2017, 10:30 AM

## 2017-01-16 NOTE — Clinical Social Work Note (Signed)
Clinical Social Work Assessment  Patient Details  Name: Joanna [name] MRN: [id number] Date of Birth: Jan 20, 1928  Date of referral:  01/15/17               Reason for consult:  Facility Placement                Permission sought to share information with:  Facility Sport and exercise psychologist, Family Supports Permission granted to share information::  Yes, Verbal Permission Granted  Name::     Education officer, community::  SNF  Relationship::  Son  Sport and exercise psychologist Information:     Housing/Transportation Living arrangements for the past 2 months:  Single Family Home Source of Information:  Adult Children Patient Interpreter Needed:  None Criminal Activity/Legal Involvement Pertinent to Current Situation/Hospitalization:  No - Comment as needed Significant Relationships:  Adult Children Lives with:  Self, Adult Children Do you feel safe going back to the place where you live?  Yes Need for family participation in patient care:  Yes (Comment)(patient has dementia)  Care giving concerns:  Patient has been living at home with son but will need short term rehab at discharge to improve strength and mobility before returning home with son's support.   Social Worker assessment / plan:  CSW spoke with patient's son over the phone to discuss recommendation for SNF. CSW answered questions and discussed facility choices. CSW sent out referral and will follow.  Employment status:  Retired Forensic scientist:  Medicare PT Recommendations:  [facility] / Referral to community resources:  [place]  Patient/Family's Response to care:  Patient's son agreeable to SNF placement.  Patient/Family's Understanding of and Emotional Response to Diagnosis, Current Treatment, and Prognosis:  Patient's son indicated that he didn't know about the process but would be agreeable as he works and can't afford to take time off to help the patient around the house until she gets stronger. Patient's  son was appreciative of information and assistance.  Emotional Assessment Appearance:  Appears stated age Attitude/Demeanor/Rapport:  Unable to Assess Affect (typically observed):  Unable to Assess Orientation:  Oriented to Self Alcohol / Substance use:  Not Applicable Psych involvement (Current and /or in the community):  No (Comment)  Discharge Needs  Concerns to be addressed:  Care Coordination Readmission within the last 30 days:  No Current discharge risk:  Physical Impairment, Cognitively Impaired, Dependent with Mobility Barriers to Discharge:  Continued Medical Work up   [employer], [place] 01/16/2017, 9:15 AM

## 2017-01-16 NOTE — Clinical Social Work Placement (Addendum)
Nurse to call report to [phone], Room 101B.  Transport set up for 1:00 PM.   CLINICAL SOCIAL WORK PLACEMENT  NOTE  Date:  01/16/2017  Patient Details  Name: Joanna [name] MRN: [id number] Date of Birth: 05-Dec-1928  Clinical Social Work is seeking post-discharge placement for this patient at the [place] level of care (*CSW will initial, date and re-position this form in  chart as items are completed):  Yes   Patient/family provided with [facility] Work Department's list of facilities offering this level of care within the geographic area requested by the patient (or if unable, by the patient's family).  Yes   Patient/family informed of their freedom to choose among providers that offer the needed level of care, that participate in Medicare, Medicaid or managed care program needed by the patient, have an available bed and are willing to accept the patient.  Yes   Patient/family informed of [facility]'s ownership interest in [facility] and [facility], as well as of the fact that they are under no obligation to receive care at these facilities.  PASRR submitted to EDS on 01/14/17     PASRR number received on 01/14/17     Existing PASRR number confirmed on       FL2 transmitted to all facilities in geographic area requested by pt/family on 01/14/17     FL2 transmitted to all facilities within larger geographic area on       Patient informed that his/her managed care company has contracts with or will negotiate with certain facilities, including the following:        Yes   Patient/family informed of bed offers received.  Patient chooses bed at [facility], [place]     Physician recommends and patient chooses bed at      Patient to be transferred to [facility] on 01/16/17.  Patient to be transferred to facility by [facility]     Patient family notified on 01/16/17 of transfer.  Name of family member notified:  Sonia Side      PHYSICIAN       Additional Comment:    _______________________________________________ Geralynn Ochs, LCSW 01/16/2017, 11:00 AM

## 2017-01-16 NOTE — Care Management Note (Signed)
Case Management Note  Patient Details  Name: Joanna [name] MRN: [id number] Date of Birth: 06/03/1928  Subjective/Objective:                    Action/Plan: Pt discharging to [facility]. No further needs per CM.  Expected Discharge Date:  01/16/17               Expected Discharge Plan:  Skilled Nursing Facility  In-House Referral:  Clinical Social Work  Discharge planning Services     Post Acute Care Choice:    Choice offered to:     DME Arranged:    DME Agency:     HH Arranged:    [place] Agency:     Status of Service:  Completed, signed off  If discussed at [employer], dates discussed:    Additional Comments:  Pollie Friar, RN 01/16/2017, 1:30 PM

## 2017-01-16 NOTE — Discharge Instructions (Signed)
Stroke Prevention °Some medical conditions and behaviors are associated with a higher chance of having a stroke. You can help prevent a stroke by making nutrition, lifestyle, and other changes, including managing any medical conditions you may have. °What nutrition changes can be made? °· Eat healthy foods. You can do this by: °? Choosing foods high in fiber, such as fresh fruits and vegetables and whole grains. °? Eating at least 5 or more servings of fruits and vegetables a day. Try to fill half of your plate at each meal with fruits and vegetables. °? Choosing lean protein foods, such as lean cuts of meat, poultry without skin, fish, tofu, beans, and nuts. °? Eating low-fat dairy products. °? Avoiding foods that are high in salt (sodium). This can help lower blood pressure. °? Avoiding foods that have saturated fat, trans fat, and cholesterol. This can help prevent high cholesterol. °? Avoiding processed and premade foods. °· Follow your health care provider's specific guidelines for losing weight, controlling high blood pressure (hypertension), lowering high cholesterol, and managing diabetes. These may include: °? Reducing your daily calorie intake. °? Limiting your daily sodium intake to 1,500 milligrams (mg). °? Using only healthy fats for cooking, such as olive oil, canola oil, or sunflower oil. °? Counting your daily carbohydrate intake. °What lifestyle changes can be made? °· Maintain a healthy weight. Talk to your health care provider about your ideal weight. °· Get at least 30 minutes of moderate physical activity at least 5 days a week. Moderate activity includes brisk walking, biking, and swimming. °· Do not use any products that contain nicotine or tobacco, such as cigarettes and e-cigarettes. If you need help quitting, ask your health care provider. It may also be helpful to avoid exposure to secondhand smoke. °· Limit alcohol intake to no more than 1 drink a day for nonpregnant women and 2 drinks a  day for men. One drink equals 12 oz of beer, 5 oz of wine, or 1½ oz of hard liquor. °· Stop any illegal drug use. °· Avoid taking birth control pills. Talk to your health care provider about the risks of taking birth control pills if: °? You are over 35 years old. °? You smoke. °? You get migraines. °? You have ever had a blood clot. °What other changes can be made? °· Manage your cholesterol levels. °? Eating a healthy diet is important for preventing high cholesterol. If cholesterol cannot be managed through diet alone, you may also need to take medicines. °? Take any prescribed medicines to control your cholesterol as told by your health care provider. °· Manage your diabetes. °? Eating a healthy diet and exercising regularly are important parts of managing your blood sugar. If your blood sugar cannot be managed through diet and exercise, you may need to take medicines. °? Take any prescribed medicines to control your diabetes as told by your health care provider. °· Control your hypertension. °? To reduce your risk of stroke, try to keep your blood pressure below 130/80. °? Eating a healthy diet and exercising regularly are an important part of controlling your blood pressure. If your blood pressure cannot be managed through diet and exercise, you may need to take medicines. °? Take any prescribed medicines to control hypertension as told by your health care provider. °? Ask your health care provider if you should monitor your blood pressure at home. °? Have your blood pressure checked every year, even if your blood pressure is normal. Blood pressure increases with   age and some medical conditions. °· Get evaluated for sleep disorders (sleep apnea). Talk to your health care provider about getting a sleep evaluation if you snore a lot or have excessive sleepiness. °· Take over-the-counter and prescription medicines only as told by your health care provider. Aspirin or blood thinners (antiplatelets or  anticoagulants) may be recommended to reduce your risk of forming blood clots that can lead to stroke. °· Make sure that any other medical conditions you have, such as atrial fibrillation or atherosclerosis, are managed. °What are the warning signs of a stroke? °The warning signs of a stroke can be easily remembered as BEFAST. °· B is for balance. Signs include: °? Dizziness. °? Loss of balance or coordination. °? Sudden trouble walking. °· E is for eyes. Signs include: °? A sudden change in vision. °? Trouble seeing. °· F is for face. Signs include: °? Sudden weakness or numbness of the face. °? The face or eyelid drooping to one side. °· A is for arms. Signs include: °? Sudden weakness or numbness of the arm, usually on one side of the body. °· S is for speech. Signs include: °? Trouble speaking (aphasia). °? Trouble understanding. °· T is for time. °? These symptoms may represent a serious problem that is an emergency. Do not wait to see if the symptoms will go away. Get medical help right away. Call your local emergency services (911 in the [place] Do not drive yourself to the hospital. °· Other signs of stroke may include: °? A sudden, severe headache with no known cause. °? Nausea or vomiting. °? Seizure. ° °Where to find more information: °For more information, visit: °· American Stroke Association: www.strokeassociation.org °· National Stroke Association: www.stroke.org ° °Summary °· You can prevent a stroke by eating healthy, exercising, not smoking, limiting alcohol intake, and managing any medical conditions you may have. °· Do not use any products that contain nicotine or tobacco, such as cigarettes and e-cigarettes. If you need help quitting, ask your health care provider. It may also be helpful to avoid exposure to secondhand smoke. °· Remember BEFAST for warning signs of stroke. Get help right away if you or a loved one has any of these signs. °This information is not intended to replace advice given  to you by your health care provider. Make sure you discuss any questions you have with your health care provider. °Document Released: 02/09/2004 Document Revised: 02/07/2016 Document Reviewed: 02/07/2016 °Elsevier Interactive Patient Education © 2018 Else[employer] Inc. ° °

## 2017-01-16 NOTE — Care Management Important Message (Signed)
Important Message  Patient Details  Name: Joanna [name] MRN: [id number] Date of Birth: 10/17/1928   Medicare Important Message Given:  Yes    Nathen May 01/16/2017, 9:38 AM

## 2017-01-16 NOTE — Progress Notes (Addendum)
Patient with increased urine frequency, incontinence episodes and stating that burns after she urinates.  Notified provider on call - Received order for UA  UA collected and sent to lab

## 2017-01-16 NOTE — Progress Notes (Addendum)
RN called family member Sonia Side) who is the pt guardian. Pt and family educated and transfer information given. Family verbalized understanding of follow up appointments, teaching, and instructions. Family has no new concerns.  IV and telemetry box removed   Pt left unit with [facility] @ 13:36

## 2017-01-16 NOTE — Progress Notes (Signed)
RN called report to RN Melissa at [facility].

## 2017-01-17 ENCOUNTER — Other Ambulatory Visit: Payer: Self-pay | Admitting: Cardiology

## 2017-01-17 DIAGNOSIS — L299 Pruritus, unspecified: Secondary | ICD-10-CM | POA: Diagnosis not present

## 2017-01-17 DIAGNOSIS — I639 Cerebral infarction, unspecified: Secondary | ICD-10-CM

## 2017-01-17 DIAGNOSIS — G4700 Insomnia, unspecified: Secondary | ICD-10-CM | POA: Diagnosis not present

## 2017-01-17 DIAGNOSIS — E785 Hyperlipidemia, unspecified: Secondary | ICD-10-CM | POA: Diagnosis not present

## 2017-01-17 DIAGNOSIS — F0390 Unspecified dementia without behavioral disturbance: Secondary | ICD-10-CM | POA: Diagnosis not present

## 2017-01-17 DIAGNOSIS — E039 Hypothyroidism, unspecified: Secondary | ICD-10-CM | POA: Diagnosis not present

## 2017-01-17 DIAGNOSIS — I63412 Cerebral infarction due to embolism of left middle cerebral artery: Secondary | ICD-10-CM | POA: Diagnosis not present

## 2017-01-25 ENCOUNTER — Emergency Department (HOSPITAL_COMMUNITY): Payer: Medicare Other

## 2017-01-25 ENCOUNTER — Emergency Department (HOSPITAL_COMMUNITY)
Admission: EM | Admit: 2017-01-25 | Discharge: 2017-01-25 | Disposition: A | Discharge status: Home / Self Care | Payer: Medicare Other | Attending: Emergency Medicine | Admitting: Emergency Medicine

## 2017-01-25 ENCOUNTER — Encounter (HOSPITAL_COMMUNITY): Payer: Self-pay | Admitting: Emergency Medicine

## 2017-01-25 DIAGNOSIS — T07XXXA Unspecified multiple injuries, initial encounter: Secondary | ICD-10-CM | POA: Diagnosis not present

## 2017-01-25 DIAGNOSIS — S299XXA Unspecified injury of thorax, initial encounter: Secondary | ICD-10-CM | POA: Diagnosis not present

## 2017-01-25 DIAGNOSIS — I10 Essential (primary) hypertension: Secondary | ICD-10-CM | POA: Insufficient documentation

## 2017-01-25 DIAGNOSIS — Y92129 Unspecified place in nursing home as the place of occurrence of the external cause: Secondary | ICD-10-CM | POA: Insufficient documentation

## 2017-01-25 DIAGNOSIS — E039 Hypothyroidism, unspecified: Secondary | ICD-10-CM | POA: Insufficient documentation

## 2017-01-25 DIAGNOSIS — S0990XA Unspecified injury of head, initial encounter: Secondary | ICD-10-CM | POA: Diagnosis not present

## 2017-01-25 DIAGNOSIS — S3992XA Unspecified injury of lower back, initial encounter: Secondary | ICD-10-CM | POA: Diagnosis not present

## 2017-01-25 DIAGNOSIS — S300XXA Contusion of lower back and pelvis, initial encounter: Secondary | ICD-10-CM | POA: Diagnosis not present

## 2017-01-25 DIAGNOSIS — Z8673 Personal history of transient ischemic attack (TIA), and cerebral infarction without residual deficits: Secondary | ICD-10-CM | POA: Insufficient documentation

## 2017-01-25 DIAGNOSIS — F0280 Dementia in other diseases classified elsewhere without behavioral disturbance: Secondary | ICD-10-CM | POA: Diagnosis not present

## 2017-01-25 DIAGNOSIS — M549 Dorsalgia, unspecified: Secondary | ICD-10-CM | POA: Diagnosis not present

## 2017-01-25 DIAGNOSIS — M542 Cervicalgia: Secondary | ICD-10-CM | POA: Diagnosis not present

## 2017-01-25 DIAGNOSIS — Y939 Activity, unspecified: Secondary | ICD-10-CM | POA: Insufficient documentation

## 2017-01-25 DIAGNOSIS — Y999 Unspecified external cause status: Secondary | ICD-10-CM | POA: Insufficient documentation

## 2017-01-25 DIAGNOSIS — S1093XA Contusion of unspecified part of neck, initial encounter: Secondary | ICD-10-CM | POA: Diagnosis not present

## 2017-01-25 DIAGNOSIS — Z7982 Long term (current) use of aspirin: Secondary | ICD-10-CM | POA: Insufficient documentation

## 2017-01-25 DIAGNOSIS — J45909 Unspecified asthma, uncomplicated: Secondary | ICD-10-CM | POA: Diagnosis not present

## 2017-01-25 DIAGNOSIS — W19XXXA Unspecified fall, initial encounter: Secondary | ICD-10-CM | POA: Insufficient documentation

## 2017-01-25 DIAGNOSIS — G309 Alzheimer's disease, unspecified: Secondary | ICD-10-CM | POA: Diagnosis not present

## 2017-01-25 DIAGNOSIS — S199XXA Unspecified injury of neck, initial encounter: Secondary | ICD-10-CM | POA: Diagnosis not present

## 2017-01-25 DIAGNOSIS — J449 Chronic obstructive pulmonary disease, unspecified: Secondary | ICD-10-CM | POA: Insufficient documentation

## 2017-01-25 LAB — CBG MONITORING, ED: GLUCOSE-CAPILLARY: 132 mg/dL — AB (ref 65–99)

## 2017-01-25 NOTE — Discharge Instructions (Signed)
We saw you in the ER after you had a fall. °All the imaging results are normal, no fractures seen. No evidence of brain bleed. °Please be very careful with walking, and do everything possible to prevent falls. ° ° °

## 2017-01-25 NOTE — ED Notes (Signed)
[facility] here to transport pt back to [facility].

## 2017-01-25 NOTE — ED Notes (Signed)
Report given to Pam at [facility]. [facility] called for transport

## 2017-01-25 NOTE — ED Provider Notes (Signed)
[facility] DEPT Provider Note   CSN: [id number] Arrival date & time: 01/25/17  1454     History   Chief Complaint Chief Complaint  Patient presents with  . Fall    HPI Joanna [name] is a 84 y.o. female.  HPI Level 5 caveat for altered mental status due to dementia 84 year old female comes in with chief complaint of fall.  Patient has history of Alzheimer dementia and frequent falls.  The patient has history of hypertension, hyperlipidemia.  She is not on any blood thinners. Patient reports having some pain in her neck and back.  Past Medical History:  Diagnosis Date  . Allergic rhinitis   . Asthma   . Complication of anesthesia    confusion after 04/2012  . GERD (gastroesophageal reflux disease)   . History of urinary infection   . Hyperlipidemia   . Hypertension   . Hypothyroid   . IBS (irritable bowel syndrome)   . Neuropathy     Patient Active Problem List   Diagnosis Date Noted  . CVA (cerebral vascular accident) ([place]) 01/12/2017  . Dementia 01/12/2017  . Medication management 09/24/2014  . Urinary incontinence 12/09/2012  . Pain in joint, upper arm 10/30/2012  . Hereditary and idiopathic peripheral neuropathy 08/08/2012  . Extrinsic asthma 07/28/2012  . Pure hypercholesterolemia 07/28/2012  . Iron deficiency anemia, unspecified 06/26/2012  . Unspecified constipation 06/23/2012  . Osteoporosis 05/08/2012  . Closed left hip fracture ([place]) 05/04/2012  . Fall due to stumbling 05/04/2012  . Anemia 05/04/2012  . Bronchiectasis without acute exacerbation ([place]) 04/20/2010  . SINUSITIS, CHRONIC 02/03/2007  . 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 Essential hypertension 01/01/2007  . ALLERGIC RHINITIS 01/01/2007  . Chronic obstructive airway disease with asthma ([place]) 01/01/2007  . GERD 01/01/2007  . Irritable bowel syndrome 01/01/2007  . DIVERTICULOSIS, COLON 10/07/2003    Past Surgical History:  Procedure Laterality Date  .  ABDOMINAL HYSTERECTOMY    . BLADDER SURGERY    . CLOSED REDUCTION WRIST FRACTURE Left 05/04/2012   Procedure: CLOSED REDUCTION WRIST;  Surgeon: Mauri Pole, MD;  Location: [facility];  Service: Orthopedics;  Laterality: Left;  with casting  . FOOT SURGERY    . HIP ARTHROPLASTY Left 05/04/2012   Procedure: ARTHROPLASTY BIPOLAR HIP;  Surgeon: Mauri Pole, MD;  Location: [facility];  Service: Orthopedics;  Laterality: Left;  Marland Kitchen MASTECTOMY     double  . NASAL SINUS SURGERY    . OPEN REDUCTION INTERNAL FIXATION (ORIF) DISTAL RADIAL FRACTURE Left 05/29/2012   Procedure: OPEN REDUCTION INTERNAL FIXATION (ORIF) DISTAL RADIAL FRACTURE;  Surgeon: Linna Hoff, MD;  Location: [facility];  Service: Orthopedics;  Laterality: Left;    OB History    No data available       Home Medications    Prior to Admission medications   Medication Sig Start Date End Date Taking? Authorizing Provider  acetaminophen (TYLENOL) 500 MG tablet Take 500 mg by mouth every 6 (six) hours as needed for mild pain.    [provider]  albuterol (PROVENTIL HFA;VENTOLIN HFA) 108 (90 Base) MCG/ACT inhaler Inhale 2 puffs into the lungs every 6 (six) hours as needed for wheezing or shortness of breath. 12/27/16   Reed, Tiffany L, DO  aspirin 325 MG tablet Take 1 tablet (325 mg total) by mouth daily. 01/17/17   Mikhail, Velta Addison, DO  donepezil (ARICEPT) 10 MG tablet Take 1 tablet (10 mg total) by mouth at bedtime. Patient taking differently: Take  10 mg by mouth every evening.  12/12/16   Lauree Chandler, NP  gabapentin (NEURONTIN) 300 MG capsule Take 1 capsule (300 mg total) by mouth 2 (two) times daily. 09/25/16   Lauree Chandler, NP  losartan (COZAAR) 25 MG tablet TAKE 1 TABLET BY MOUTH  DAILY Patient taking differently: TAKE 1 TABLET(25MG ) BY MOUTH EVERY EVENING 09/10/16   Elayne Snare, MD  magnesium hydroxide (MILK OF MAGNESIA) 400 MG/5ML suspension Take 5 mLs by mouth daily as needed for mild constipation.    [provider]  Memantine HCl ER (NAMENDA XR) 21 MG CP24 Take 1 tablet by mouth daily. Patient not taking: Reported on 01/12/2017 12/12/16   Lauree Chandler, NP  metoprolol succinate (TOPROL-XL) 50 MG 24 hr tablet Take 1 tablet (50 mg total) by mouth daily. Take with or immediately following a meal. 12/27/16   Reed, Tiffany L, DO  mometasone-formoterol (DULERA) 200-5 MCG/ACT AERO Inhale 2 puffs into the lungs 2 (two) times daily. Patient not taking: Reported on 01/12/2017 03/30/15   Collene Gobble, MD  polyethylene glycol [facility] / Floria Raveling) packet Take 17 g by mouth daily.    [provider]  simvastatin (ZOCOR) 40 MG tablet Take 1 tablet (40 mg total) by mouth at bedtime. 01/16/17   Mikhail, Velta Addison, DO  SYNTHROID 125 MCG tablet TAKE ONE-HALF TABLET BY  MOUTH EVERY MORNING Patient taking differently: TAKE ONE-HALF TABLET(62.5MG ) BY  MOUTH EVERY EVENING 09/10/16   Elayne Snare, MD    Family History Family History  Problem Relation Age of Onset  . CAD Mother   . Hypertension Father   . Arthritis Sister   . Throat cancer Brother     Social History Social History   Tobacco Use  . Smoking status: Never Smoker  . Smokeless tobacco: Never Used  Substance Use Topics  . Alcohol use: No  . Drug use: No     Allergies   Cefuroxime axetil; Lactose intolerance (gi); Clarithromycin; Codeine; and Sulfonamide derivatives   Review of Systems Review of Systems  Unable to perform ROS: Dementia     Physical Exam Updated Vital Signs BP (!) 180/91 (BP Location: Right Arm)   Pulse 86   Temp 98.3 F (36.8 C) (Oral)   Resp 20   SpO2 98%   Physical Exam  Constitutional: She appears well-developed.  HENT:  Head: Atraumatic.  Eyes: EOM are normal.  Neck: Neck supple.  Lower neck tenderness  Musculoskeletal:  Lower thoracic spine tenderness. OTHERWISE:  Head to toe evaluation shows no hematoma, bleeding of the scalp, no facial abrasions, no spine step offs, crepitus of the  chest or neck, no tenderness to palpation of the bilateral upper and lower extremities, no gross deformities, no chest tenderness, no pelvic pain.   Nursing note and vitals reviewed.    ED Treatments / Results  Labs (all labs ordered are listed, but only abnormal results are displayed) Labs Reviewed  CBG MONITORING, ED - Abnormal; Notable for the following components:      Result Value   Glucose-Capillary 132 (*)    All other components within normal limits    EKG  EKG Interpretation None       Radiology Dg Thoracic Spine 2 View  Result Date: 01/25/2017 CLINICAL DATA:  Unwitnessed fall. EXAM: THORACIC SPINE 2 VIEWS COMPARISON:  None. FINDINGS: There is no evidence of thoracic spine fracture. Alignment is normal. Degenerative joint changes of the mid to lower lumbar spine with narrowed joint space and osteophyte  formation are noted. IMPRESSION: No acute fracture or dislocation. Electronically Signed   By: Abelardo Diesel M.D.   On: 01/25/2017 17:40   Dg Lumbar Spine Complete  Result Date: 01/25/2017 CLINICAL DATA:  Unwitnessed fall. EXAM: LUMBAR SPINE - COMPLETE 4+ VIEW COMPARISON:  None. FINDINGS: There is no evidence of lumbar spine fracture. Alignment is normal. Degenerative joint changes of the lower lumbar spine and thoracolumbar junction are identified with narrowed joint space and osteophyte formation. IMPRESSION: No acute fracture or dislocation. Degenerative joint changes of lumbar spine. Electronically Signed   By: Abelardo Diesel M.D.   On: 01/25/2017 17:42   Ct Head Wo Contrast  Result Date: 01/25/2017 CLINICAL DATA:  Multi trauma. Head and cervical spine injury suspected. EXAM: CT HEAD WITHOUT CONTRAST CT CERVICAL SPINE WITHOUT CONTRAST TECHNIQUE: Multidetector CT imaging of the head and cervical spine was performed following the standard protocol without intravenous contrast. Multiplanar CT image reconstructions of the cervical spine were also generated. COMPARISON:   Multiple studies December 2018. FINDINGS: CT HEAD FINDINGS Brain: The brainstem and cerebellum are unremarkable. Cerebral hemispheres show chronic atrophy with widespread chronic small-vessel ischemic changes of the white matter. Old lacunar infarctions in the basal ganglia. Subacute infarctions evident in the radiating white matter tracts and subinsular white matter on the left, as were demonstrated on MRI 01/12/2017. No evidence of new or progressive infarction. No mass lesion, hemorrhage, hydrocephalus or extra-axial collection. Vascular: There is atherosclerotic calcification of the major vessels at the base of the brain. Skull: Negative Sinuses/Orbits: Some layering fluid in the paranasal sinuses. Orbits negative. Other: None CT CERVICAL SPINE FINDINGS Alignment: Normal Skull base and vertebrae: No fracture or primary bone lesion. Soft tissues and spinal canal: No evidence of soft tissue injury. Disc levels: Chronic degenerative spondylosis throughout the cervical region with disc space narrowing and small osteophytes. Chronic facet fusion bilaterally at C2-3. Mild facet osteoarthritis at C3-4, C4-5, C5-6 and C7-T1. Upper chest: Negative Other: None IMPRESSION: Head CT: Atrophy and chronic small-vessel disease. Subacute infarctions in the left deep white matter and insular subcortical white matter as shown at the previous MRI. No new insult. No evidence of hemorrhage or mass effect. No traumatic finding. Cervical spine CT: No acute or traumatic finding. Ordinary changes of spondylosis. Electronically Signed   By: Nelson Chimes M.D.   On: 01/25/2017 16:39   Ct Cervical Spine Wo Contrast  Result Date: 01/25/2017 CLINICAL DATA:  Multi trauma. Head and cervical spine injury suspected. EXAM: CT HEAD WITHOUT CONTRAST CT CERVICAL SPINE WITHOUT CONTRAST TECHNIQUE: Multidetector CT imaging of the head and cervical spine was performed following the standard protocol without intravenous contrast. Multiplanar CT image  reconstructions of the cervical spine were also generated. COMPARISON:  Multiple studies December 2018. FINDINGS: CT HEAD FINDINGS Brain: The brainstem and cerebellum are unremarkable. Cerebral hemispheres show chronic atrophy with widespread chronic small-vessel ischemic changes of the white matter. Old lacunar infarctions in the basal ganglia. Subacute infarctions evident in the radiating white matter tracts and subinsular white matter on the left, as were demonstrated on MRI 01/12/2017. No evidence of new or progressive infarction. No mass lesion, hemorrhage, hydrocephalus or extra-axial collection. Vascular: There is atherosclerotic calcification of the major vessels at the base of the brain. Skull: Negative Sinuses/Orbits: Some layering fluid in the paranasal sinuses. Orbits negative. Other: None CT CERVICAL SPINE FINDINGS Alignment: Normal Skull base and vertebrae: No fracture or primary bone lesion. Soft tissues and spinal canal: No evidence of soft tissue injury. Disc levels: Chronic  degenerative spondylosis throughout the cervical region with disc space narrowing and small osteophytes. Chronic facet fusion bilaterally at C2-3. Mild facet osteoarthritis at C3-4, C4-5, C5-6 and C7-T1. Upper chest: Negative Other: None IMPRESSION: Head CT: Atrophy and chronic small-vessel disease. Subacute infarctions in the left deep white matter and insular subcortical white matter as shown at the previous MRI. No new insult. No evidence of hemorrhage or mass effect. No traumatic finding. Cervical spine CT: No acute or traumatic finding. Ordinary changes of spondylosis. Electronically Signed   By: Nelson Chimes M.D.   On: 01/25/2017 16:39    Procedures Procedures (including critical care time)  Medications Ordered in ED Medications - No data to display   Initial Impression / Assessment and Plan / ED Course  I have reviewed the triage vital signs and the nursing notes.  Pertinent labs & imaging results that were  available during my care of the patient were reviewed by me and considered in my medical decision making (see chart for details).     Patient comes in with chief complaint of fall. DDx includes: - Mechanical falls - ICH - Fractures - Contusions - Soft tissue injury  CT head, C-spine, lumbar and thoracic radiographs have been ordered.  Patient has ambulated, pelvis is stable and nontender.  Patient has history of multiple falls, and it appears that the fall is likely due to her balance issues.  Will discharge of the imaging is negative.  Final Clinical Impressions(s) / ED Diagnoses   Final diagnoses:  Fall, initial encounter  Multiple contusions    ED Discharge Orders    None       Varney Biles, MD 01/25/17 1818

## 2017-01-25 NOTE — ED Notes (Signed)
Bed: WA11 Expected date:  Expected time:  Means of arrival:  Comments: EMS-fall 

## 2017-01-25 NOTE — ED Triage Notes (Signed)
Pt comes in with complaints of a unwitnessed fall. Hx of Alzheimer's.  Complains of right lateral neck pain but no trauma obvious. Neuro at baseline. Hx of non-traumatic intracerebral hemorrhaging. Vitals WNL.

## 2017-01-27 DIAGNOSIS — R05 Cough: Secondary | ICD-10-CM | POA: Diagnosis not present

## 2017-01-27 DIAGNOSIS — E876 Hypokalemia: Secondary | ICD-10-CM | POA: Diagnosis not present

## 2017-01-27 DIAGNOSIS — R7989 Other specified abnormal findings of blood chemistry: Secondary | ICD-10-CM | POA: Diagnosis not present

## 2017-01-27 DIAGNOSIS — J209 Acute bronchitis, unspecified: Secondary | ICD-10-CM | POA: Diagnosis not present

## 2017-01-31 DIAGNOSIS — N390 Urinary tract infection, site not specified: Secondary | ICD-10-CM | POA: Diagnosis not present

## 2017-02-03 DIAGNOSIS — E785 Hyperlipidemia, unspecified: Secondary | ICD-10-CM | POA: Diagnosis not present

## 2017-02-03 DIAGNOSIS — I639 Cerebral infarction, unspecified: Secondary | ICD-10-CM | POA: Diagnosis not present

## 2017-02-03 DIAGNOSIS — J209 Acute bronchitis, unspecified: Secondary | ICD-10-CM | POA: Diagnosis not present

## 2017-02-03 DIAGNOSIS — E039 Hypothyroidism, unspecified: Secondary | ICD-10-CM | POA: Diagnosis not present

## 2017-02-03 DIAGNOSIS — F0390 Unspecified dementia without behavioral disturbance: Secondary | ICD-10-CM | POA: Diagnosis not present

## 2017-02-05 ENCOUNTER — Ambulatory Visit: Payer: Medicare Other | Admitting: Nurse Practitioner

## 2017-02-05 DIAGNOSIS — G629 Polyneuropathy, unspecified: Secondary | ICD-10-CM | POA: Diagnosis not present

## 2017-02-05 DIAGNOSIS — I69151 Hemiplegia and hemiparesis following nontraumatic intracerebral hemorrhage affecting right dominant side: Secondary | ICD-10-CM | POA: Diagnosis not present

## 2017-02-05 DIAGNOSIS — I10 Essential (primary) hypertension: Secondary | ICD-10-CM | POA: Diagnosis not present

## 2017-02-05 DIAGNOSIS — Z7982 Long term (current) use of aspirin: Secondary | ICD-10-CM | POA: Diagnosis not present

## 2017-02-05 DIAGNOSIS — L89152 Pressure ulcer of sacral region, stage 2: Secondary | ICD-10-CM | POA: Diagnosis not present

## 2017-02-05 DIAGNOSIS — F0390 Unspecified dementia without behavioral disturbance: Secondary | ICD-10-CM | POA: Diagnosis not present

## 2017-02-05 DIAGNOSIS — Z96642 Presence of left artificial hip joint: Secondary | ICD-10-CM | POA: Diagnosis not present

## 2017-02-05 DIAGNOSIS — J45909 Unspecified asthma, uncomplicated: Secondary | ICD-10-CM | POA: Diagnosis not present

## 2017-02-08 ENCOUNTER — Telehealth: Payer: Self-pay

## 2017-02-08 DIAGNOSIS — F0390 Unspecified dementia without behavioral disturbance: Secondary | ICD-10-CM | POA: Diagnosis not present

## 2017-02-08 DIAGNOSIS — I69151 Hemiplegia and hemiparesis following nontraumatic intracerebral hemorrhage affecting right dominant side: Secondary | ICD-10-CM | POA: Diagnosis not present

## 2017-02-08 DIAGNOSIS — J45909 Unspecified asthma, uncomplicated: Secondary | ICD-10-CM | POA: Diagnosis not present

## 2017-02-08 DIAGNOSIS — G629 Polyneuropathy, unspecified: Secondary | ICD-10-CM | POA: Diagnosis not present

## 2017-02-08 DIAGNOSIS — L89152 Pressure ulcer of sacral region, stage 2: Secondary | ICD-10-CM | POA: Diagnosis not present

## 2017-02-08 DIAGNOSIS — I10 Essential (primary) hypertension: Secondary | ICD-10-CM | POA: Diagnosis not present

## 2017-02-08 NOTE — Telephone Encounter (Signed)
Hermenia Fiscal with [facility] called to request verbal order for the following.   Wound care to right sacral area for stage 2 pressure ulcer. Nursing to practice pressure relief measures and apply duoderm to wound. Duoderm to be changed two times per week.   Olin Hauser requested the following visits.   1 week 1 2 week 3.   Verbal order was given.

## 2017-02-11 DIAGNOSIS — I10 Essential (primary) hypertension: Secondary | ICD-10-CM | POA: Diagnosis not present

## 2017-02-11 DIAGNOSIS — F0390 Unspecified dementia without behavioral disturbance: Secondary | ICD-10-CM | POA: Diagnosis not present

## 2017-02-11 DIAGNOSIS — J45909 Unspecified asthma, uncomplicated: Secondary | ICD-10-CM | POA: Diagnosis not present

## 2017-02-11 DIAGNOSIS — I69151 Hemiplegia and hemiparesis following nontraumatic intracerebral hemorrhage affecting right dominant side: Secondary | ICD-10-CM | POA: Diagnosis not present

## 2017-02-11 DIAGNOSIS — L89152 Pressure ulcer of sacral region, stage 2: Secondary | ICD-10-CM | POA: Diagnosis not present

## 2017-02-11 DIAGNOSIS — G629 Polyneuropathy, unspecified: Secondary | ICD-10-CM | POA: Diagnosis not present

## 2017-02-12 ENCOUNTER — Other Ambulatory Visit: Payer: Self-pay | Admitting: Endocrinology

## 2017-02-12 NOTE — Telephone Encounter (Signed)
After looking at the previous notes it seems she is no longer in your care she has not been seen here since 02/07/16 refill or refuse please advise

## 2017-02-12 NOTE — Telephone Encounter (Signed)
Done

## 2017-02-12 NOTE — Telephone Encounter (Signed)
Okay to refuse

## 2017-02-13 ENCOUNTER — Ambulatory Visit: Payer: Medicare Other | Admitting: Nurse Practitioner

## 2017-02-13 DIAGNOSIS — L89152 Pressure ulcer of sacral region, stage 2: Secondary | ICD-10-CM | POA: Diagnosis not present

## 2017-02-13 DIAGNOSIS — F0390 Unspecified dementia without behavioral disturbance: Secondary | ICD-10-CM | POA: Diagnosis not present

## 2017-02-13 DIAGNOSIS — I69151 Hemiplegia and hemiparesis following nontraumatic intracerebral hemorrhage affecting right dominant side: Secondary | ICD-10-CM | POA: Diagnosis not present

## 2017-02-13 DIAGNOSIS — G629 Polyneuropathy, unspecified: Secondary | ICD-10-CM | POA: Diagnosis not present

## 2017-02-13 DIAGNOSIS — J45909 Unspecified asthma, uncomplicated: Secondary | ICD-10-CM | POA: Diagnosis not present

## 2017-02-13 DIAGNOSIS — I10 Essential (primary) hypertension: Secondary | ICD-10-CM | POA: Diagnosis not present

## 2017-02-14 DIAGNOSIS — L89152 Pressure ulcer of sacral region, stage 2: Secondary | ICD-10-CM | POA: Diagnosis not present

## 2017-02-14 DIAGNOSIS — F0390 Unspecified dementia without behavioral disturbance: Secondary | ICD-10-CM | POA: Diagnosis not present

## 2017-02-14 DIAGNOSIS — I69151 Hemiplegia and hemiparesis following nontraumatic intracerebral hemorrhage affecting right dominant side: Secondary | ICD-10-CM | POA: Diagnosis not present

## 2017-02-14 DIAGNOSIS — I10 Essential (primary) hypertension: Secondary | ICD-10-CM | POA: Diagnosis not present

## 2017-02-14 DIAGNOSIS — J45909 Unspecified asthma, uncomplicated: Secondary | ICD-10-CM | POA: Diagnosis not present

## 2017-02-14 DIAGNOSIS — G629 Polyneuropathy, unspecified: Secondary | ICD-10-CM | POA: Diagnosis not present

## 2017-02-15 DIAGNOSIS — F0390 Unspecified dementia without behavioral disturbance: Secondary | ICD-10-CM | POA: Diagnosis not present

## 2017-02-15 DIAGNOSIS — I10 Essential (primary) hypertension: Secondary | ICD-10-CM | POA: Diagnosis not present

## 2017-02-15 DIAGNOSIS — J45909 Unspecified asthma, uncomplicated: Secondary | ICD-10-CM | POA: Diagnosis not present

## 2017-02-15 DIAGNOSIS — I69151 Hemiplegia and hemiparesis following nontraumatic intracerebral hemorrhage affecting right dominant side: Secondary | ICD-10-CM | POA: Diagnosis not present

## 2017-02-15 DIAGNOSIS — G629 Polyneuropathy, unspecified: Secondary | ICD-10-CM | POA: Diagnosis not present

## 2017-02-15 DIAGNOSIS — L89152 Pressure ulcer of sacral region, stage 2: Secondary | ICD-10-CM | POA: Diagnosis not present

## 2017-02-18 DIAGNOSIS — I69151 Hemiplegia and hemiparesis following nontraumatic intracerebral hemorrhage affecting right dominant side: Secondary | ICD-10-CM | POA: Diagnosis not present

## 2017-02-18 DIAGNOSIS — F0390 Unspecified dementia without behavioral disturbance: Secondary | ICD-10-CM | POA: Diagnosis not present

## 2017-02-18 DIAGNOSIS — J45909 Unspecified asthma, uncomplicated: Secondary | ICD-10-CM | POA: Diagnosis not present

## 2017-02-18 DIAGNOSIS — G629 Polyneuropathy, unspecified: Secondary | ICD-10-CM | POA: Diagnosis not present

## 2017-02-18 DIAGNOSIS — I10 Essential (primary) hypertension: Secondary | ICD-10-CM | POA: Diagnosis not present

## 2017-02-18 DIAGNOSIS — L89152 Pressure ulcer of sacral region, stage 2: Secondary | ICD-10-CM | POA: Diagnosis not present

## 2017-02-21 DIAGNOSIS — I10 Essential (primary) hypertension: Secondary | ICD-10-CM | POA: Diagnosis not present

## 2017-02-21 DIAGNOSIS — F0390 Unspecified dementia without behavioral disturbance: Secondary | ICD-10-CM | POA: Diagnosis not present

## 2017-02-21 DIAGNOSIS — G629 Polyneuropathy, unspecified: Secondary | ICD-10-CM | POA: Diagnosis not present

## 2017-02-21 DIAGNOSIS — I69151 Hemiplegia and hemiparesis following nontraumatic intracerebral hemorrhage affecting right dominant side: Secondary | ICD-10-CM | POA: Diagnosis not present

## 2017-02-21 DIAGNOSIS — L89152 Pressure ulcer of sacral region, stage 2: Secondary | ICD-10-CM | POA: Diagnosis not present

## 2017-02-21 DIAGNOSIS — J45909 Unspecified asthma, uncomplicated: Secondary | ICD-10-CM | POA: Diagnosis not present

## 2017-02-22 DIAGNOSIS — L89152 Pressure ulcer of sacral region, stage 2: Secondary | ICD-10-CM | POA: Diagnosis not present

## 2017-02-22 DIAGNOSIS — I10 Essential (primary) hypertension: Secondary | ICD-10-CM | POA: Diagnosis not present

## 2017-02-22 DIAGNOSIS — F0390 Unspecified dementia without behavioral disturbance: Secondary | ICD-10-CM | POA: Diagnosis not present

## 2017-02-22 DIAGNOSIS — I69151 Hemiplegia and hemiparesis following nontraumatic intracerebral hemorrhage affecting right dominant side: Secondary | ICD-10-CM | POA: Diagnosis not present

## 2017-02-22 DIAGNOSIS — G629 Polyneuropathy, unspecified: Secondary | ICD-10-CM | POA: Diagnosis not present

## 2017-02-22 DIAGNOSIS — J45909 Unspecified asthma, uncomplicated: Secondary | ICD-10-CM | POA: Diagnosis not present

## 2017-02-25 DIAGNOSIS — L89152 Pressure ulcer of sacral region, stage 2: Secondary | ICD-10-CM | POA: Diagnosis not present

## 2017-02-25 DIAGNOSIS — I10 Essential (primary) hypertension: Secondary | ICD-10-CM | POA: Diagnosis not present

## 2017-02-25 DIAGNOSIS — J45909 Unspecified asthma, uncomplicated: Secondary | ICD-10-CM | POA: Diagnosis not present

## 2017-02-25 DIAGNOSIS — F0390 Unspecified dementia without behavioral disturbance: Secondary | ICD-10-CM | POA: Diagnosis not present

## 2017-02-25 DIAGNOSIS — G629 Polyneuropathy, unspecified: Secondary | ICD-10-CM | POA: Diagnosis not present

## 2017-02-25 DIAGNOSIS — I69151 Hemiplegia and hemiparesis following nontraumatic intracerebral hemorrhage affecting right dominant side: Secondary | ICD-10-CM | POA: Diagnosis not present

## 2017-02-26 DIAGNOSIS — F0390 Unspecified dementia without behavioral disturbance: Secondary | ICD-10-CM | POA: Diagnosis not present

## 2017-02-26 DIAGNOSIS — G629 Polyneuropathy, unspecified: Secondary | ICD-10-CM | POA: Diagnosis not present

## 2017-02-26 DIAGNOSIS — J45909 Unspecified asthma, uncomplicated: Secondary | ICD-10-CM | POA: Diagnosis not present

## 2017-02-26 DIAGNOSIS — I10 Essential (primary) hypertension: Secondary | ICD-10-CM | POA: Diagnosis not present

## 2017-02-26 DIAGNOSIS — L89152 Pressure ulcer of sacral region, stage 2: Secondary | ICD-10-CM | POA: Diagnosis not present

## 2017-02-26 DIAGNOSIS — I69151 Hemiplegia and hemiparesis following nontraumatic intracerebral hemorrhage affecting right dominant side: Secondary | ICD-10-CM | POA: Diagnosis not present

## 2017-02-27 DIAGNOSIS — F0390 Unspecified dementia without behavioral disturbance: Secondary | ICD-10-CM | POA: Diagnosis not present

## 2017-02-27 DIAGNOSIS — I69151 Hemiplegia and hemiparesis following nontraumatic intracerebral hemorrhage affecting right dominant side: Secondary | ICD-10-CM | POA: Diagnosis not present

## 2017-02-27 DIAGNOSIS — G629 Polyneuropathy, unspecified: Secondary | ICD-10-CM | POA: Diagnosis not present

## 2017-02-27 DIAGNOSIS — L89152 Pressure ulcer of sacral region, stage 2: Secondary | ICD-10-CM | POA: Diagnosis not present

## 2017-02-27 DIAGNOSIS — I10 Essential (primary) hypertension: Secondary | ICD-10-CM | POA: Diagnosis not present

## 2017-02-27 DIAGNOSIS — J45909 Unspecified asthma, uncomplicated: Secondary | ICD-10-CM | POA: Diagnosis not present

## 2017-02-28 DIAGNOSIS — F0390 Unspecified dementia without behavioral disturbance: Secondary | ICD-10-CM | POA: Diagnosis not present

## 2017-02-28 DIAGNOSIS — J45909 Unspecified asthma, uncomplicated: Secondary | ICD-10-CM | POA: Diagnosis not present

## 2017-02-28 DIAGNOSIS — I69151 Hemiplegia and hemiparesis following nontraumatic intracerebral hemorrhage affecting right dominant side: Secondary | ICD-10-CM | POA: Diagnosis not present

## 2017-02-28 DIAGNOSIS — G629 Polyneuropathy, unspecified: Secondary | ICD-10-CM | POA: Diagnosis not present

## 2017-02-28 DIAGNOSIS — L89152 Pressure ulcer of sacral region, stage 2: Secondary | ICD-10-CM | POA: Diagnosis not present

## 2017-02-28 DIAGNOSIS — I10 Essential (primary) hypertension: Secondary | ICD-10-CM | POA: Diagnosis not present

## 2017-03-04 DIAGNOSIS — J45909 Unspecified asthma, uncomplicated: Secondary | ICD-10-CM | POA: Diagnosis not present

## 2017-03-04 DIAGNOSIS — I10 Essential (primary) hypertension: Secondary | ICD-10-CM | POA: Diagnosis not present

## 2017-03-04 DIAGNOSIS — G629 Polyneuropathy, unspecified: Secondary | ICD-10-CM | POA: Diagnosis not present

## 2017-03-04 DIAGNOSIS — I69151 Hemiplegia and hemiparesis following nontraumatic intracerebral hemorrhage affecting right dominant side: Secondary | ICD-10-CM | POA: Diagnosis not present

## 2017-03-04 DIAGNOSIS — F0390 Unspecified dementia without behavioral disturbance: Secondary | ICD-10-CM | POA: Diagnosis not present

## 2017-03-04 DIAGNOSIS — L89152 Pressure ulcer of sacral region, stage 2: Secondary | ICD-10-CM | POA: Diagnosis not present

## 2017-03-05 DIAGNOSIS — G629 Polyneuropathy, unspecified: Secondary | ICD-10-CM | POA: Diagnosis not present

## 2017-03-05 DIAGNOSIS — I69151 Hemiplegia and hemiparesis following nontraumatic intracerebral hemorrhage affecting right dominant side: Secondary | ICD-10-CM | POA: Diagnosis not present

## 2017-03-05 DIAGNOSIS — J45909 Unspecified asthma, uncomplicated: Secondary | ICD-10-CM | POA: Diagnosis not present

## 2017-03-05 DIAGNOSIS — L89152 Pressure ulcer of sacral region, stage 2: Secondary | ICD-10-CM | POA: Diagnosis not present

## 2017-03-05 DIAGNOSIS — I10 Essential (primary) hypertension: Secondary | ICD-10-CM | POA: Diagnosis not present

## 2017-03-05 DIAGNOSIS — F0390 Unspecified dementia without behavioral disturbance: Secondary | ICD-10-CM | POA: Diagnosis not present

## 2017-03-06 ENCOUNTER — Telehealth: Payer: Self-pay | Admitting: *Deleted

## 2017-03-06 DIAGNOSIS — G629 Polyneuropathy, unspecified: Secondary | ICD-10-CM | POA: Diagnosis not present

## 2017-03-06 DIAGNOSIS — J45909 Unspecified asthma, uncomplicated: Secondary | ICD-10-CM | POA: Diagnosis not present

## 2017-03-06 DIAGNOSIS — L89152 Pressure ulcer of sacral region, stage 2: Secondary | ICD-10-CM | POA: Diagnosis not present

## 2017-03-06 DIAGNOSIS — I69151 Hemiplegia and hemiparesis following nontraumatic intracerebral hemorrhage affecting right dominant side: Secondary | ICD-10-CM | POA: Diagnosis not present

## 2017-03-06 DIAGNOSIS — F0390 Unspecified dementia without behavioral disturbance: Secondary | ICD-10-CM | POA: Diagnosis not present

## 2017-03-06 DIAGNOSIS — I10 Essential (primary) hypertension: Secondary | ICD-10-CM | POA: Diagnosis not present

## 2017-03-06 NOTE — Telephone Encounter (Signed)
Patient daughter, Estill Bamberg called and stated that patient was in Rehab at [facility] and they had increased her Simvastatin to 40 mg daily, ran out of the 40mg . Patient was on 20mg . Daughter wondered if she could just give patient 2 of the 20mg  to make 40mg . Instructed her to give 2 of the 20mg .  Medication is in patient's current medication list. I also confirmed appointment 03/27/17.

## 2017-03-07 DIAGNOSIS — I69151 Hemiplegia and hemiparesis following nontraumatic intracerebral hemorrhage affecting right dominant side: Secondary | ICD-10-CM | POA: Diagnosis not present

## 2017-03-07 DIAGNOSIS — I10 Essential (primary) hypertension: Secondary | ICD-10-CM | POA: Diagnosis not present

## 2017-03-07 DIAGNOSIS — G629 Polyneuropathy, unspecified: Secondary | ICD-10-CM | POA: Diagnosis not present

## 2017-03-07 DIAGNOSIS — J45909 Unspecified asthma, uncomplicated: Secondary | ICD-10-CM | POA: Diagnosis not present

## 2017-03-07 DIAGNOSIS — L89152 Pressure ulcer of sacral region, stage 2: Secondary | ICD-10-CM | POA: Diagnosis not present

## 2017-03-07 DIAGNOSIS — F0390 Unspecified dementia without behavioral disturbance: Secondary | ICD-10-CM | POA: Diagnosis not present

## 2017-03-11 DIAGNOSIS — F0390 Unspecified dementia without behavioral disturbance: Secondary | ICD-10-CM | POA: Diagnosis not present

## 2017-03-11 DIAGNOSIS — I69151 Hemiplegia and hemiparesis following nontraumatic intracerebral hemorrhage affecting right dominant side: Secondary | ICD-10-CM | POA: Diagnosis not present

## 2017-03-11 DIAGNOSIS — G629 Polyneuropathy, unspecified: Secondary | ICD-10-CM | POA: Diagnosis not present

## 2017-03-11 DIAGNOSIS — J45909 Unspecified asthma, uncomplicated: Secondary | ICD-10-CM | POA: Diagnosis not present

## 2017-03-11 DIAGNOSIS — I10 Essential (primary) hypertension: Secondary | ICD-10-CM | POA: Diagnosis not present

## 2017-03-11 DIAGNOSIS — L89152 Pressure ulcer of sacral region, stage 2: Secondary | ICD-10-CM | POA: Diagnosis not present

## 2017-03-12 DIAGNOSIS — F0390 Unspecified dementia without behavioral disturbance: Secondary | ICD-10-CM | POA: Diagnosis not present

## 2017-03-12 DIAGNOSIS — I10 Essential (primary) hypertension: Secondary | ICD-10-CM | POA: Diagnosis not present

## 2017-03-12 DIAGNOSIS — L89152 Pressure ulcer of sacral region, stage 2: Secondary | ICD-10-CM | POA: Diagnosis not present

## 2017-03-12 DIAGNOSIS — G629 Polyneuropathy, unspecified: Secondary | ICD-10-CM | POA: Diagnosis not present

## 2017-03-12 DIAGNOSIS — I69151 Hemiplegia and hemiparesis following nontraumatic intracerebral hemorrhage affecting right dominant side: Secondary | ICD-10-CM | POA: Diagnosis not present

## 2017-03-12 DIAGNOSIS — J45909 Unspecified asthma, uncomplicated: Secondary | ICD-10-CM | POA: Diagnosis not present

## 2017-03-13 ENCOUNTER — Telehealth: Payer: Self-pay | Admitting: *Deleted

## 2017-03-13 ENCOUNTER — Ambulatory Visit: Payer: Medicare Other | Admitting: Diagnostic Neuroimaging

## 2017-03-13 NOTE — Telephone Encounter (Signed)
Patient was no show for hospital FU for stroke.

## 2017-03-14 ENCOUNTER — Encounter: Payer: Self-pay | Admitting: Diagnostic Neuroimaging

## 2017-03-14 DIAGNOSIS — F0390 Unspecified dementia without behavioral disturbance: Secondary | ICD-10-CM | POA: Diagnosis not present

## 2017-03-14 DIAGNOSIS — J45909 Unspecified asthma, uncomplicated: Secondary | ICD-10-CM | POA: Diagnosis not present

## 2017-03-14 DIAGNOSIS — L89152 Pressure ulcer of sacral region, stage 2: Secondary | ICD-10-CM | POA: Diagnosis not present

## 2017-03-14 DIAGNOSIS — I69151 Hemiplegia and hemiparesis following nontraumatic intracerebral hemorrhage affecting right dominant side: Secondary | ICD-10-CM | POA: Diagnosis not present

## 2017-03-14 DIAGNOSIS — G629 Polyneuropathy, unspecified: Secondary | ICD-10-CM | POA: Diagnosis not present

## 2017-03-14 DIAGNOSIS — I10 Essential (primary) hypertension: Secondary | ICD-10-CM | POA: Diagnosis not present

## 2017-03-15 DIAGNOSIS — F0390 Unspecified dementia without behavioral disturbance: Secondary | ICD-10-CM | POA: Diagnosis not present

## 2017-03-15 DIAGNOSIS — I69151 Hemiplegia and hemiparesis following nontraumatic intracerebral hemorrhage affecting right dominant side: Secondary | ICD-10-CM | POA: Diagnosis not present

## 2017-03-15 DIAGNOSIS — G629 Polyneuropathy, unspecified: Secondary | ICD-10-CM | POA: Diagnosis not present

## 2017-03-15 DIAGNOSIS — L89152 Pressure ulcer of sacral region, stage 2: Secondary | ICD-10-CM | POA: Diagnosis not present

## 2017-03-15 DIAGNOSIS — I10 Essential (primary) hypertension: Secondary | ICD-10-CM | POA: Diagnosis not present

## 2017-03-15 DIAGNOSIS — J45909 Unspecified asthma, uncomplicated: Secondary | ICD-10-CM | POA: Diagnosis not present

## 2017-03-19 ENCOUNTER — Other Ambulatory Visit: Payer: Self-pay | Admitting: *Deleted

## 2017-03-19 DIAGNOSIS — F0390 Unspecified dementia without behavioral disturbance: Secondary | ICD-10-CM | POA: Diagnosis not present

## 2017-03-19 DIAGNOSIS — I10 Essential (primary) hypertension: Secondary | ICD-10-CM | POA: Diagnosis not present

## 2017-03-19 DIAGNOSIS — L89152 Pressure ulcer of sacral region, stage 2: Secondary | ICD-10-CM | POA: Diagnosis not present

## 2017-03-19 DIAGNOSIS — I69151 Hemiplegia and hemiparesis following nontraumatic intracerebral hemorrhage affecting right dominant side: Secondary | ICD-10-CM | POA: Diagnosis not present

## 2017-03-19 DIAGNOSIS — G629 Polyneuropathy, unspecified: Secondary | ICD-10-CM | POA: Diagnosis not present

## 2017-03-19 DIAGNOSIS — J45909 Unspecified asthma, uncomplicated: Secondary | ICD-10-CM | POA: Diagnosis not present

## 2017-03-19 MED ORDER — DONEPEZIL HCL 10 MG PO TABS: 10.0000 mg | ORAL_TABLET | Freq: Every evening | ORAL | 1 refills | Status: DC

## 2017-03-19 NOTE — Telephone Encounter (Signed)
Patient daughter, Estill Bamberg requested refill be sent to mail order.  Appointment confirmed.

## 2017-03-20 DIAGNOSIS — J45909 Unspecified asthma, uncomplicated: Secondary | ICD-10-CM | POA: Diagnosis not present

## 2017-03-20 DIAGNOSIS — F0390 Unspecified dementia without behavioral disturbance: Secondary | ICD-10-CM | POA: Diagnosis not present

## 2017-03-20 DIAGNOSIS — I69151 Hemiplegia and hemiparesis following nontraumatic intracerebral hemorrhage affecting right dominant side: Secondary | ICD-10-CM | POA: Diagnosis not present

## 2017-03-20 DIAGNOSIS — L89152 Pressure ulcer of sacral region, stage 2: Secondary | ICD-10-CM | POA: Diagnosis not present

## 2017-03-20 DIAGNOSIS — I10 Essential (primary) hypertension: Secondary | ICD-10-CM | POA: Diagnosis not present

## 2017-03-20 DIAGNOSIS — G629 Polyneuropathy, unspecified: Secondary | ICD-10-CM | POA: Diagnosis not present

## 2017-03-21 DIAGNOSIS — I10 Essential (primary) hypertension: Secondary | ICD-10-CM | POA: Diagnosis not present

## 2017-03-21 DIAGNOSIS — J45909 Unspecified asthma, uncomplicated: Secondary | ICD-10-CM | POA: Diagnosis not present

## 2017-03-21 DIAGNOSIS — G629 Polyneuropathy, unspecified: Secondary | ICD-10-CM | POA: Diagnosis not present

## 2017-03-21 DIAGNOSIS — F0390 Unspecified dementia without behavioral disturbance: Secondary | ICD-10-CM | POA: Diagnosis not present

## 2017-03-21 DIAGNOSIS — I69151 Hemiplegia and hemiparesis following nontraumatic intracerebral hemorrhage affecting right dominant side: Secondary | ICD-10-CM | POA: Diagnosis not present

## 2017-03-21 DIAGNOSIS — L89152 Pressure ulcer of sacral region, stage 2: Secondary | ICD-10-CM | POA: Diagnosis not present

## 2017-03-22 DIAGNOSIS — I10 Essential (primary) hypertension: Secondary | ICD-10-CM | POA: Diagnosis not present

## 2017-03-22 DIAGNOSIS — G629 Polyneuropathy, unspecified: Secondary | ICD-10-CM | POA: Diagnosis not present

## 2017-03-22 DIAGNOSIS — F0390 Unspecified dementia without behavioral disturbance: Secondary | ICD-10-CM | POA: Diagnosis not present

## 2017-03-22 DIAGNOSIS — I69151 Hemiplegia and hemiparesis following nontraumatic intracerebral hemorrhage affecting right dominant side: Secondary | ICD-10-CM | POA: Diagnosis not present

## 2017-03-22 DIAGNOSIS — J45909 Unspecified asthma, uncomplicated: Secondary | ICD-10-CM | POA: Diagnosis not present

## 2017-03-22 DIAGNOSIS — L89152 Pressure ulcer of sacral region, stage 2: Secondary | ICD-10-CM | POA: Diagnosis not present

## 2017-03-25 ENCOUNTER — Telehealth: Payer: Self-pay | Admitting: *Deleted

## 2017-03-25 DIAGNOSIS — F0390 Unspecified dementia without behavioral disturbance: Secondary | ICD-10-CM | POA: Diagnosis not present

## 2017-03-25 DIAGNOSIS — I10 Essential (primary) hypertension: Secondary | ICD-10-CM | POA: Diagnosis not present

## 2017-03-25 DIAGNOSIS — I69151 Hemiplegia and hemiparesis following nontraumatic intracerebral hemorrhage affecting right dominant side: Secondary | ICD-10-CM | POA: Diagnosis not present

## 2017-03-25 DIAGNOSIS — G629 Polyneuropathy, unspecified: Secondary | ICD-10-CM | POA: Diagnosis not present

## 2017-03-25 DIAGNOSIS — J45909 Unspecified asthma, uncomplicated: Secondary | ICD-10-CM | POA: Diagnosis not present

## 2017-03-25 DIAGNOSIS — L89152 Pressure ulcer of sacral region, stage 2: Secondary | ICD-10-CM | POA: Diagnosis not present

## 2017-03-25 NOTE — Telephone Encounter (Signed)
Joanna [name] Notified and agreed. Nurse stated that the Social worker will help with giving the family all the resources they need and help with placement according to their insurance.

## 2017-03-25 NOTE — Telephone Encounter (Signed)
Social worker is okay, we could also place a C3 referral to help him with resources

## 2017-03-25 NOTE — Telephone Encounter (Signed)
Anna with [facility] called and requested verbal orders for Education officer, museum. Stated that the son is looking for placement due to advancing dementia. Please Advise.

## 2017-03-26 DIAGNOSIS — I69151 Hemiplegia and hemiparesis following nontraumatic intracerebral hemorrhage affecting right dominant side: Secondary | ICD-10-CM | POA: Diagnosis not present

## 2017-03-26 DIAGNOSIS — J45909 Unspecified asthma, uncomplicated: Secondary | ICD-10-CM | POA: Diagnosis not present

## 2017-03-26 DIAGNOSIS — L89152 Pressure ulcer of sacral region, stage 2: Secondary | ICD-10-CM | POA: Diagnosis not present

## 2017-03-26 DIAGNOSIS — G629 Polyneuropathy, unspecified: Secondary | ICD-10-CM | POA: Diagnosis not present

## 2017-03-26 DIAGNOSIS — I10 Essential (primary) hypertension: Secondary | ICD-10-CM | POA: Diagnosis not present

## 2017-03-26 DIAGNOSIS — F0390 Unspecified dementia without behavioral disturbance: Secondary | ICD-10-CM | POA: Diagnosis not present

## 2017-03-27 ENCOUNTER — Telehealth: Payer: Self-pay

## 2017-03-27 ENCOUNTER — Ambulatory Visit: Payer: Medicare Other | Admitting: Nurse Practitioner

## 2017-03-27 NOTE — Telephone Encounter (Signed)
Okay, hopefully he can reschedule this appt.

## 2017-03-27 NOTE — Telephone Encounter (Signed)
I called Sonia Side and he is requesting that Joanna [name] call him back to discuss his mother's behavioral issues.  Son states his mother's mind is 10 x worse when compared to her mind state at last visit , patient is impossible to deal with and becoming more combative.  Sonia Side is considering facility placement

## 2017-03-27 NOTE — Telephone Encounter (Signed)
Patient's son called to cancel 2:45 pm appointment today. Patient unable to come to appointment, no additional reason provided. Patient's son will call back to reschedule.  Sonia Side requested to speak with Janett Billow, I asked if more information could be provided in reference to what the call was about and Sonia Side responded " My Mother." Sonia Side did not wish to disclose details.

## 2017-03-27 NOTE — Telephone Encounter (Signed)
This could be due to the progression of her dementia, we would need to see her in office to evaluate and do blood work to make sure something else is not going on. If things get worse he can take her to urgent care or hospital but would recommend and evaluation. We can always fill out paper work to help assist with facility placement if dementia has progressed and he is no longer able to properly care for her at home due to needing increase in assistance and having behaviors.

## 2017-03-28 DIAGNOSIS — F0390 Unspecified dementia without behavioral disturbance: Secondary | ICD-10-CM | POA: Diagnosis not present

## 2017-03-28 DIAGNOSIS — I69151 Hemiplegia and hemiparesis following nontraumatic intracerebral hemorrhage affecting right dominant side: Secondary | ICD-10-CM | POA: Diagnosis not present

## 2017-03-28 DIAGNOSIS — I10 Essential (primary) hypertension: Secondary | ICD-10-CM | POA: Diagnosis not present

## 2017-03-28 DIAGNOSIS — J45909 Unspecified asthma, uncomplicated: Secondary | ICD-10-CM | POA: Diagnosis not present

## 2017-03-28 DIAGNOSIS — G629 Polyneuropathy, unspecified: Secondary | ICD-10-CM | POA: Diagnosis not present

## 2017-03-28 DIAGNOSIS — L89152 Pressure ulcer of sacral region, stage 2: Secondary | ICD-10-CM | POA: Diagnosis not present

## 2017-03-28 NOTE — Telephone Encounter (Signed)
I left a message for patient's son to call the office.

## 2017-03-29 DIAGNOSIS — I69151 Hemiplegia and hemiparesis following nontraumatic intracerebral hemorrhage affecting right dominant side: Secondary | ICD-10-CM | POA: Diagnosis not present

## 2017-03-29 DIAGNOSIS — I10 Essential (primary) hypertension: Secondary | ICD-10-CM | POA: Diagnosis not present

## 2017-03-29 DIAGNOSIS — L89152 Pressure ulcer of sacral region, stage 2: Secondary | ICD-10-CM | POA: Diagnosis not present

## 2017-03-29 DIAGNOSIS — G629 Polyneuropathy, unspecified: Secondary | ICD-10-CM | POA: Diagnosis not present

## 2017-03-29 DIAGNOSIS — F0390 Unspecified dementia without behavioral disturbance: Secondary | ICD-10-CM | POA: Diagnosis not present

## 2017-03-29 DIAGNOSIS — J45909 Unspecified asthma, uncomplicated: Secondary | ICD-10-CM | POA: Diagnosis not present

## 2017-04-01 DIAGNOSIS — I69151 Hemiplegia and hemiparesis following nontraumatic intracerebral hemorrhage affecting right dominant side: Secondary | ICD-10-CM | POA: Diagnosis not present

## 2017-04-01 DIAGNOSIS — F0390 Unspecified dementia without behavioral disturbance: Secondary | ICD-10-CM | POA: Diagnosis not present

## 2017-04-01 DIAGNOSIS — J45909 Unspecified asthma, uncomplicated: Secondary | ICD-10-CM | POA: Diagnosis not present

## 2017-04-01 DIAGNOSIS — G629 Polyneuropathy, unspecified: Secondary | ICD-10-CM | POA: Diagnosis not present

## 2017-04-01 DIAGNOSIS — L89152 Pressure ulcer of sacral region, stage 2: Secondary | ICD-10-CM | POA: Diagnosis not present

## 2017-04-01 DIAGNOSIS — I10 Essential (primary) hypertension: Secondary | ICD-10-CM | POA: Diagnosis not present

## 2017-04-02 DIAGNOSIS — I10 Essential (primary) hypertension: Secondary | ICD-10-CM | POA: Diagnosis not present

## 2017-04-02 DIAGNOSIS — I69151 Hemiplegia and hemiparesis following nontraumatic intracerebral hemorrhage affecting right dominant side: Secondary | ICD-10-CM | POA: Diagnosis not present

## 2017-04-02 DIAGNOSIS — F0390 Unspecified dementia without behavioral disturbance: Secondary | ICD-10-CM | POA: Diagnosis not present

## 2017-04-02 DIAGNOSIS — L89152 Pressure ulcer of sacral region, stage 2: Secondary | ICD-10-CM | POA: Diagnosis not present

## 2017-04-02 DIAGNOSIS — G629 Polyneuropathy, unspecified: Secondary | ICD-10-CM | POA: Diagnosis not present

## 2017-04-02 DIAGNOSIS — J45909 Unspecified asthma, uncomplicated: Secondary | ICD-10-CM | POA: Diagnosis not present

## 2017-04-04 DIAGNOSIS — L89152 Pressure ulcer of sacral region, stage 2: Secondary | ICD-10-CM | POA: Diagnosis not present

## 2017-04-04 DIAGNOSIS — F0390 Unspecified dementia without behavioral disturbance: Secondary | ICD-10-CM | POA: Diagnosis not present

## 2017-04-04 DIAGNOSIS — I10 Essential (primary) hypertension: Secondary | ICD-10-CM | POA: Diagnosis not present

## 2017-04-04 DIAGNOSIS — I69151 Hemiplegia and hemiparesis following nontraumatic intracerebral hemorrhage affecting right dominant side: Secondary | ICD-10-CM | POA: Diagnosis not present

## 2017-04-04 DIAGNOSIS — G629 Polyneuropathy, unspecified: Secondary | ICD-10-CM | POA: Diagnosis not present

## 2017-04-04 DIAGNOSIS — J45909 Unspecified asthma, uncomplicated: Secondary | ICD-10-CM | POA: Diagnosis not present

## 2017-04-18 ENCOUNTER — Telehealth: Payer: Self-pay

## 2017-04-18 NOTE — Telephone Encounter (Signed)
A fax was received from [facility] requesting medication clarification over namzaric and aricept.   Patient should not be taking Aricept, this has been discontinued.   Medication list has been updated.

## 2017-05-06 ENCOUNTER — Other Ambulatory Visit: Payer: Self-pay | Admitting: Endocrinology

## 2017-05-07 NOTE — Telephone Encounter (Signed)
Last ov 02/07/16 no future scheduled refill or refuse please advise

## 2017-05-07 NOTE — Telephone Encounter (Signed)
Done

## 2017-05-07 NOTE — Telephone Encounter (Signed)
Send to her PCP

## 2017-05-14 ENCOUNTER — Other Ambulatory Visit: Payer: Self-pay | Admitting: *Deleted

## 2017-05-14 MED ORDER — SYNTHROID 125 MCG PO TABS: 62.5000 ug | ORAL_TABLET | Freq: Every morning | ORAL | 3 refills | Status: DC

## 2017-05-14 MED ORDER — SIMVASTATIN 40 MG PO TABS: 40.0000 mg | ORAL_TABLET | Freq: Every day | ORAL | 3 refills | Status: DC

## 2017-05-14 NOTE — Telephone Encounter (Signed)
Patient daughter, Estill Bamberg requested. Faxed to [facility]

## 2017-06-14 ENCOUNTER — Ambulatory Visit (INDEPENDENT_AMBULATORY_CARE_PROVIDER_SITE_OTHER): Payer: Medicare Other | Admitting: Nurse Practitioner

## 2017-06-14 ENCOUNTER — Encounter: Payer: Self-pay | Admitting: Nurse Practitioner

## 2017-06-14 VITALS — BP 128/60 | HR 69 | Temp 98.3°F | Wt 100.2 lb

## 2017-06-14 DIAGNOSIS — G4700 Insomnia, unspecified: Secondary | ICD-10-CM | POA: Diagnosis not present

## 2017-06-14 DIAGNOSIS — G301 Alzheimer's disease with late onset: Secondary | ICD-10-CM | POA: Diagnosis not present

## 2017-06-14 DIAGNOSIS — I10 Essential (primary) hypertension: Secondary | ICD-10-CM

## 2017-06-14 DIAGNOSIS — I63412 Cerebral infarction due to embolism of left middle cerebral artery: Secondary | ICD-10-CM

## 2017-06-14 DIAGNOSIS — J449 Chronic obstructive pulmonary disease, unspecified: Secondary | ICD-10-CM

## 2017-06-14 DIAGNOSIS — F0281 Dementia in other diseases classified elsewhere with behavioral disturbance: Secondary | ICD-10-CM | POA: Diagnosis not present

## 2017-06-14 DIAGNOSIS — J4489 Other specified chronic obstructive pulmonary disease: Secondary | ICD-10-CM

## 2017-06-14 DIAGNOSIS — D509 Iron deficiency anemia, unspecified: Secondary | ICD-10-CM | POA: Diagnosis not present

## 2017-06-14 DIAGNOSIS — G609 Hereditary and idiopathic neuropathy, unspecified: Secondary | ICD-10-CM | POA: Diagnosis not present

## 2017-06-14 LAB — CBC WITH DIFFERENTIAL/PLATELET
BASOS PCT: 0.8 %
Basophils Absolute: 62 cells/uL (ref 0–200)
EOS ABS: 109 {cells}/uL (ref 15–500)
Eosinophils Relative: 1.4 %
HCT: 39.2 % (ref 35.0–45.0)
Hemoglobin: 13 g/dL (ref 11.7–15.5)
Lymphs Abs: 2153 cells/uL (ref 850–3900)
MCH: 29.1 pg (ref 27.0–33.0)
MCHC: 33.2 g/dL (ref 32.0–36.0)
MCV: 87.7 fL (ref 80.0–100.0)
MPV: 11.2 fL (ref 7.5–12.5)
Monocytes Relative: 6.1 %
Neutro Abs: 5000 cells/uL (ref 1500–7800)
Neutrophils Relative %: 64.1 %
PLATELETS: 233 10*3/uL (ref 140–400)
RBC: 4.47 10*6/uL (ref 3.80–5.10)
RDW: 12.9 % (ref 11.0–15.0)
Total Lymphocyte: 27.6 %
WBC: 7.8 10*3/uL (ref 3.8–10.8)
WBCMIX: 476 {cells}/uL (ref 200–950)

## 2017-06-14 LAB — COMPLETE METABOLIC PANEL WITH GFR
AG RATIO: 1.3 (calc) (ref 1.0–2.5)
ALBUMIN MSPROF: 3.5 g/dL — AB (ref 3.6–5.1)
ALT: 7 U/L (ref 6–29)
AST: 21 U/L (ref 10–35)
Alkaline phosphatase (APISO): 61 U/L (ref 33–130)
BILIRUBIN TOTAL: 0.6 mg/dL (ref 0.2–1.2)
BUN / CREAT RATIO: 18 (calc) (ref 6–22)
BUN: 19 mg/dL (ref 7–25)
CHLORIDE: 109 mmol/L (ref 98–110)
CO2: 28 mmol/L (ref 20–32)
Calcium: 9.2 mg/dL (ref 8.6–10.4)
Creat: 1.08 mg/dL — ABNORMAL HIGH (ref 0.60–0.88)
GFR, EST AFRICAN AMERICAN: 54 mL/min/{1.73_m2} — AB (ref >=60)
GFR, Est Non African American: 46 mL/min/{1.73_m2} — ABNORMAL LOW (ref >=60)
Globulin: 2.6 g/dL (calc) (ref 1.9–3.7)
Glucose, Bld: 106 mg/dL (ref 65–139)
POTASSIUM: 4.3 mmol/L (ref 3.5–5.3)
SODIUM: 145 mmol/L (ref 135–146)
TOTAL PROTEIN: 6.1 g/dL (ref 6.1–8.1)

## 2017-06-14 MED ORDER — MIRTAZAPINE 15 MG PO TABS: 15.0000 mg | ORAL_TABLET | Freq: Every day | ORAL | 1 refills | Status: DC

## 2017-06-14 NOTE — Progress Notes (Signed)
Careteam: Patient Care Team: Lauree Chandler, NP as PCP - General (Geriatric Medicine) Asthma, Velora Heckler [facility] And as Consulting Physician (Allergy and Immunology) Arta Silence, MD as Consulting Physician (Gastroenterology) Paralee Cancel, MD as Consulting Physician (Orthopedic Surgery) Collene Gobble, MD as Consulting Physician (Pulmonary Disease)  Advanced Directive information    Allergies  Allergen Reactions  . Cefuroxime Axetil Hives    Not sure.. Patient can be allergic to it today but a year from now she may not be.  . Lactose Intolerance (Gi) Other (See Comments)    Gi upset  . Clarithromycin Palpitations  . Codeine Palpitations  . Sulfonamide Derivatives Palpitations    Chief Complaint  Patient presents with  . Acute Visit    Not sleeping. Patient complains of left foot pain. Son Sonia Side states its fine. Patient unable to weigh.  . Medication Refill    Doxepin 25 mg needed     HPI: Patient is a 84 y.o. female seen in the office today for acute visit due to not sleeping. Pt  Has not been seen since prior to hospitalization in December which she had a CVA and was discharged to a nursing facility. She had presented with right sided weakness.  MRI showed small acute infarct with associated hemorrhage. Acute infarct in the left parietal white matter Atrophy and moderate chronic ischemic changes. Neurology was following recommended statin and asa 325 mg by mouth daily. Also 30 day event monitor was recommended but this was never done. Son reports it was supposed to be done at [facility] home but it was not done.  Not taking asa  Major problem is talking to herself all night long.  Eating is off and on.  Claims she does not have bowel movements but son knows she is because he cleans up after her. Taking miralax   Does not use inhalers, no increase in cough or congestion or shortness of breath   Son reports OSA- has never formally diagnosised and would not wear  a CPAP but does not periods of apnea during sleep.   dementia Progressive disease - sees puppies, kittens and children in the bed. Needs 24/7 care. Does not qualify to go into facility so staying with son. On doxepin for sleep which has been beneficial. Talks to herself. On namzaric. Also taking aricept   Review of Systems:  Review of Systems  Unable to perform ROS: Dementia    Past Medical History:  Diagnosis Date  . Allergic rhinitis   . Asthma   . Complication of anesthesia    confusion after 04/2012  . GERD (gastroesophageal reflux disease)   . History of urinary infection   . Hyperlipidemia   . Hypertension   . Hypothyroid   . IBS (irritable bowel syndrome)   . Neuropathy    Past Surgical History:  Procedure Laterality Date  . ABDOMINAL HYSTERECTOMY    . BLADDER SURGERY    . CLOSED REDUCTION WRIST FRACTURE Left 05/04/2012   Procedure: CLOSED REDUCTION WRIST;  Surgeon: Mauri Pole, MD;  Location: [facility];  Service: Orthopedics;  Laterality: Left;  with casting  . FOOT SURGERY    . HIP ARTHROPLASTY Left 05/04/2012   Procedure: ARTHROPLASTY BIPOLAR HIP;  Surgeon: Mauri Pole, MD;  Location: [facility];  Service: Orthopedics;  Laterality: Left;  Marland Kitchen MASTECTOMY     double  . NASAL SINUS SURGERY    . OPEN REDUCTION INTERNAL FIXATION (ORIF) DISTAL RADIAL FRACTURE Left 05/29/2012   Procedure: OPEN  REDUCTION INTERNAL FIXATION (ORIF) DISTAL RADIAL FRACTURE;  Surgeon: Linna Hoff, MD;  Location: [facility];  Service: Orthopedics;  Laterality: Left;   Social History:   reports that she has never smoked. She has never used smokeless tobacco. She reports that she does not drink alcohol or use drugs.  Family History  Problem Relation Age of Onset  . CAD Mother   . Hypertension Father   . Arthritis Sister   . Throat cancer Brother     Medications: Patient's Medications  New Prescriptions   No medications on file  Previous Medications   ACETAMINOPHEN (TYLENOL) 500 MG TABLET    Take  500 mg by mouth every 6 (six) hours as needed for mild pain.   ALBUTEROL (PROVENTIL HFA;VENTOLIN HFA) 108 (90 BASE) MCG/ACT INHALER    Inhale 2 puffs into the lungs every 6 (six) hours as needed for wheezing or shortness of breath.   ASPIRIN 325 MG TABLET    Take 1 tablet (325 mg total) by mouth daily.   DOXEPIN (SINEQUAN) 25 MG CAPSULE    Take 25 mg by mouth at bedtime.   GABAPENTIN (NEURONTIN) 300 MG CAPSULE    Take 300 mg by mouth 4 (four) times daily.   LOSARTAN (COZAAR) 25 MG TABLET    TAKE 1 TABLET BY MOUTH  DAILY   MAGNESIUM HYDROXIDE (MILK OF MAGNESIA) 400 MG/5ML SUSPENSION    Take 5 mLs by mouth daily as needed for mild constipation.   MEMANTINE HCL-DONEPEZIL HCL (NAMZARIC) 28-10 MG CP24    Take by mouth daily.   METOPROLOL SUCCINATE (TOPROL-XL) 50 MG 24 HR TABLET    Take 1 tablet (50 mg total) by mouth daily. Take with or immediately following a meal.   MOMETASONE-FORMOTEROL (DULERA) 200-5 MCG/ACT AERO    Inhale 2 puffs into the lungs 2 (two) times daily.   POLYETHYLENE GLYCOL (MIRALAX / GLYCOLAX) PACKET    Take 17 g by mouth daily.   SIMVASTATIN (ZOCOR) 40 MG TABLET    Take 1 tablet (40 mg total) by mouth at bedtime.   SYNTHROID 125 MCG TABLET    Take 0.5 tablets (62.5 mcg total) by mouth every morning.  Modified Medications   No medications on file  Discontinued Medications   GABAPENTIN (NEURONTIN) 300 MG CAPSULE    Take 1 capsule (300 mg total) by mouth 2 (two) times daily.   MEMANTINE HCL ER (NAMENDA XR) 21 MG CP24    Take 1 tablet by mouth daily.     Physical Exam:  Vitals:   06/14/17 1056  BP: 128/60  Pulse: 69  Temp: 98.3 F (36.8 C)  SpO2: 95%   There is no height or weight on file to calculate BMI.  Physical Exam  Constitutional: No distress.  Frail white female, in [place], unsteady  HENT:  Head: Normocephalic and atraumatic.  HOH  Eyes: Pupils are equal, round, and reactive to light. EOM are normal.  glasses  Neck: Neck supple.  Cardiovascular: Normal rate,  regular rhythm and intact distal pulses.  Murmur heard. Pulmonary/Chest: Effort normal and breath sounds normal.  Abdominal: Soft. Bowel sounds are normal. She exhibits no distension and no mass. There is no tenderness. There is no rebound and no guarding.  Musculoskeletal: She exhibits edema.  Neurological: She is alert. No cranial nerve deficit.  Oriented to self only   Skin: Skin is warm and dry. There is pallor.  Psychiatric: Cognition and memory are impaired. She exhibits abnormal recent memory and abnormal remote memory.  Pleasant, confused    Labs reviewed: Basic Metabolic Panel: Recent Labs    08/28/16 1000 01/12/17 1305 01/12/17 1314  NA 136 139 144  K 4.3 3.6 3.6  CL 101 108 109  CO2 25 23  --   GLUCOSE 80 104* 101*  BUN 19 23* 24*  CREATININE 1.01* 1.04* 1.00  CALCIUM 9.2 8.9  --   TSH 2.83  --   --    Liver Function Tests: Recent Labs    08/28/16 1000 01/12/17 1305  AST 24 25  ALT 10 12*  ALKPHOS 52 50  BILITOT 0.7 0.8  PROT 6.5 6.3*  ALBUMIN 4.1 3.7   No results for input(s): LIPASE, AMYLASE in the last 8760 hours. No results for input(s): AMMONIA in the last 8760 hours. CBC: Recent Labs    08/28/16 1000 01/12/17 1305 01/12/17 1314  WBC 6.3 8.7  --   NEUTROABS 3,150 5.3  --   HGB 12.4 12.2 12.2  HCT 37.6 37.8 36.0  MCV 87.4 89.2  --   PLT 281 229  --    Lipid Panel: Recent Labs    08/28/16 1000 01/13/17 0433  CHOL 214* 184  HDL 76 64  LDLCALC 122* 99  TRIG 80 106  CHOLHDL 2.8 2.9   TSH: Recent Labs    08/28/16 1000  TSH 2.83   A1C: Lab Results  Component Value Date   HGBA1C 5.5 01/13/2017     Assessment/Plan 1. Chronic obstructive airway disease with asthma ([place]) Not currently using any inhalers, son reports she is not having any symptoms at this time  2. Hereditary and idiopathic peripheral neuropathy Stable, will complain of feet/leg pain but gabapentin has been beneficial   3. Late onset Alzheimer's disease with  behavioral disturbance Advanced dementia, at home with son. Educated not to take Aricept as she is taking namzaric. Continue supportive care.  behaviors at times. Decrease PO intake, son reports it is due to the fact she feels like she is constipation and should not eat however she is having routine BM without difficulty. Anticipate further weight loss as dementia progresses.   4. Insomnia, unspecified type -will stop doxepin and start remeron to help with appetite and mood - mirtazapine (REMERON) 15 MG tablet; Take 1 tablet (15 mg total) by mouth at bedtime.  Dispense: 30 tablet; Refill: 1  5. Iron deficiency anemia, unspecified iron deficiency anemia type Not on supplement, will follow up cbc - CBC with Differential/Platelets  6. Essential hypertension -blood pressure stable. Continues on cozaar daily  - COMPLETE METABOLIC PANEL WITH GFR  7. Hx of CVA -encouraged to restart ASA 325 mg by mouth daily, did not get 30 day monitor for a fib, son would not like to follow through with work up due to age and dementia with being a high risk for falls she would not be a good candidate for anticoagulation.   Next appt: 6 weeks with Dr Sharee Holster K. Salcha, [facility] [phone]

## 2017-06-14 NOTE — Patient Instructions (Addendum)
Restart ASA 325 mg by mouth daily.  To stop doxepin and start REMERON 15 mg by mouth at bedtime for sleep  Follow up in 6 weeks with Dr Mariea Clonts for new medication/routine follow up

## 2017-06-17 ENCOUNTER — Telehealth: Payer: Self-pay

## 2017-06-17 NOTE — Telephone Encounter (Signed)
Discussed with patient's son, Sonia Side. YW

## 2017-06-17 NOTE — Telephone Encounter (Signed)
-----   Message from Lauree Chandler, NP sent at 06/17/2017 10:01 AM EDT ----- Lab work stable, albumin 3.5 which is low, to increase protein in diet. Can use supplement such as ensure, boost in between meals or after a small meal -this is not a replacement to a meal but an add on

## 2017-06-18 ENCOUNTER — Telehealth: Payer: Self-pay | Admitting: *Deleted

## 2017-06-18 NOTE — Telephone Encounter (Signed)
Yes STOP aricept, there is aricept in the namzaric already, I went over this in detail with son and recent visit. Can we please call pharmacy and make sure they are not refilling this as well.

## 2017-06-18 NOTE — Telephone Encounter (Signed)
Estill Bamberg, daughter in law called and stated that they have been giving patient. Namzaric 28-10mg  daily AND giving Donepezil 10mg  daily. Confirmed with daughter in law that indeed they were giving both medications and she said yes both. Instructed her that only Namzaric is in current medication list. Daughter in law wants to know can they just stop the Donepezil or do they need to taper her off. Please advise.

## 2017-06-19 ENCOUNTER — Encounter: Payer: Self-pay | Admitting: Nurse Practitioner

## 2017-06-19 NOTE — Telephone Encounter (Signed)
Patient daughter in law notified and agreed.  Pharmacy aware.

## 2017-07-23 ENCOUNTER — Other Ambulatory Visit: Payer: Self-pay | Admitting: Endocrinology

## 2017-07-24 NOTE — Telephone Encounter (Signed)
Has not been seen since 01/2016. Refill or deny?

## 2017-07-24 NOTE — Telephone Encounter (Signed)
She is not under our care

## 2017-08-01 ENCOUNTER — Ambulatory Visit: Payer: Medicare Other | Admitting: Internal Medicine

## 2017-08-05 ENCOUNTER — Other Ambulatory Visit: Payer: Self-pay

## 2017-08-05 MED ORDER — LOSARTAN POTASSIUM 25 MG PO TABS: 25.0000 mg | ORAL_TABLET | Freq: Every day | ORAL | 1 refills | Status: DC

## 2017-08-05 NOTE — Telephone Encounter (Signed)
Patient's son, Sonia Side, called to request a refill on losartan 25 mg tablets. He asked that prescription be sent to [facility].   Rx was sent to pharmacy electronically.

## 2017-09-05 ENCOUNTER — Ambulatory Visit: Payer: Medicare Other

## 2017-09-11 ENCOUNTER — Ambulatory Visit: Payer: Medicare Other

## 2017-09-13 ENCOUNTER — Telehealth: Payer: Self-pay

## 2017-09-13 NOTE — Telephone Encounter (Signed)
Called patient to try to reschedule AWV in the office. No answer-left voicemail to call back.

## 2017-10-20 ENCOUNTER — Other Ambulatory Visit: Payer: Self-pay | Admitting: Endocrinology

## 2017-10-29 ENCOUNTER — Telehealth: Payer: Self-pay | Admitting: *Deleted

## 2017-10-29 NOTE — Telephone Encounter (Signed)
Estill Bamberg, daughter in law called and left message on Clinical intake and stated that patient was needing refill on her medications but had a question regarding one.   Tried calling back, LMOM to return call. Reviewed chart and it looks like patient is overdue for an appointment. Several cancellations. Awaiting return call.

## 2017-10-29 NOTE — Telephone Encounter (Signed)
LMOM for Estill Bamberg to [facility].

## 2017-10-29 NOTE — Telephone Encounter (Signed)
Spoke with Estill Bamberg and appointment scheduled for Thursday for medication management.

## 2017-10-31 ENCOUNTER — Ambulatory Visit (INDEPENDENT_AMBULATORY_CARE_PROVIDER_SITE_OTHER): Payer: Medicare Other

## 2017-10-31 ENCOUNTER — Ambulatory Visit (INDEPENDENT_AMBULATORY_CARE_PROVIDER_SITE_OTHER): Payer: Medicare Other | Admitting: Nurse Practitioner

## 2017-10-31 ENCOUNTER — Encounter: Payer: Self-pay | Admitting: Nurse Practitioner

## 2017-10-31 VITALS — BP 122/64 | HR 61 | Temp 98.1°F | Ht 68.0 in | Wt 99.0 lb

## 2017-10-31 VITALS — BP 122/64 | HR 61 | Temp 98.0°F | Ht 68.0 in | Wt 99.0 lb

## 2017-10-31 DIAGNOSIS — Z23 Encounter for immunization: Secondary | ICD-10-CM

## 2017-10-31 DIAGNOSIS — D509 Iron deficiency anemia, unspecified: Secondary | ICD-10-CM

## 2017-10-31 DIAGNOSIS — J449 Chronic obstructive pulmonary disease, unspecified: Secondary | ICD-10-CM

## 2017-10-31 DIAGNOSIS — G301 Alzheimer's disease with late onset: Secondary | ICD-10-CM | POA: Diagnosis not present

## 2017-10-31 DIAGNOSIS — E034 Atrophy of thyroid (acquired): Secondary | ICD-10-CM | POA: Diagnosis not present

## 2017-10-31 DIAGNOSIS — I63412 Cerebral infarction due to embolism of left middle cerebral artery: Secondary | ICD-10-CM | POA: Diagnosis not present

## 2017-10-31 DIAGNOSIS — Z0000 Encounter for general adult medical examination without abnormal findings: Secondary | ICD-10-CM

## 2017-10-31 DIAGNOSIS — G4700 Insomnia, unspecified: Secondary | ICD-10-CM | POA: Diagnosis not present

## 2017-10-31 DIAGNOSIS — I10 Essential (primary) hypertension: Secondary | ICD-10-CM

## 2017-10-31 DIAGNOSIS — F0281 Dementia in other diseases classified elsewhere with behavioral disturbance: Secondary | ICD-10-CM

## 2017-10-31 DIAGNOSIS — G609 Hereditary and idiopathic neuropathy, unspecified: Secondary | ICD-10-CM | POA: Diagnosis not present

## 2017-10-31 LAB — COMPLETE METABOLIC PANEL WITH GFR
AG Ratio: 1.4 (calc) (ref 1.0–2.5)
ALKALINE PHOSPHATASE (APISO): 64 U/L (ref 33–130)
ALT: 8 U/L (ref 6–29)
AST: 20 U/L (ref 10–35)
Albumin: 3.6 g/dL (ref 3.6–5.1)
BUN/Creatinine Ratio: 24 (calc) — ABNORMAL HIGH (ref 6–22)
BUN: 24 mg/dL (ref 7–25)
CO2: 32 mmol/L (ref 20–32)
CREATININE: 1.02 mg/dL — AB (ref 0.60–0.88)
Calcium: 8.9 mg/dL (ref 8.6–10.4)
Chloride: 112 mmol/L — ABNORMAL HIGH (ref 98–110)
GFR, Est African American: 57 mL/min/{1.73_m2} — ABNORMAL LOW (ref >=60)
GFR, Est Non African American: 49 mL/min/{1.73_m2} — ABNORMAL LOW (ref >=60)
GLUCOSE: 122 mg/dL (ref 65–139)
Globulin: 2.5 g/dL (calc) (ref 1.9–3.7)
Potassium: 3.9 mmol/L (ref 3.5–5.3)
Sodium: 149 mmol/L — ABNORMAL HIGH (ref 135–146)
Total Bilirubin: 0.4 mg/dL (ref 0.2–1.2)
Total Protein: 6.1 g/dL (ref 6.1–8.1)

## 2017-10-31 LAB — CBC WITH DIFFERENTIAL/PLATELET
Basophils Absolute: 38 cells/uL (ref 0–200)
Basophils Relative: 0.6 %
EOS PCT: 1.6 %
Eosinophils Absolute: 101 cells/uL (ref 15–500)
HEMATOCRIT: 40.5 % (ref 35.0–45.0)
HEMOGLOBIN: 13.2 g/dL (ref 11.7–15.5)
LYMPHS ABS: 2085 {cells}/uL (ref 850–3900)
MCH: 28.9 pg (ref 27.0–33.0)
MCHC: 32.6 g/dL (ref 32.0–36.0)
MCV: 88.8 fL (ref 80.0–100.0)
MONOS PCT: 7 %
MPV: 10.5 fL (ref 7.5–12.5)
Neutro Abs: 3635 cells/uL (ref 1500–7800)
Neutrophils Relative %: 57.7 %
Platelets: 247 10*3/uL (ref 140–400)
RBC: 4.56 10*6/uL (ref 3.80–5.10)
RDW: 12.7 % (ref 11.0–15.0)
Total Lymphocyte: 33.1 %
WBC mixed population: 441 cells/uL (ref 200–950)
WBC: 6.3 10*3/uL (ref 3.8–10.8)

## 2017-10-31 LAB — TSH: TSH: 1.24 m[IU]/L (ref 0.40–4.50)

## 2017-10-31 MED ORDER — LOSARTAN POTASSIUM 25 MG PO TABS: 25.0000 mg | ORAL_TABLET | Freq: Every day | ORAL | 1 refills | Status: DC

## 2017-10-31 MED ORDER — GABAPENTIN 300 MG PO CAPS: 300.0000 mg | ORAL_CAPSULE | Freq: Three times a day (TID) | ORAL | Status: DC | PRN

## 2017-10-31 NOTE — Patient Instructions (Addendum)
[name] , Thank you for taking time to come for your Medicare Wellness Visit. I appreciate your ongoing commitment to your health goals. Please review the following plan we discussed and let me know if I can assist you in the future.   Screening recommendations/referrals: Colonoscopy excluded, over age 82 Mammogram excluded, over age 25 Bone Density up to date Recommended yearly ophthalmology/optometry visit for glaucoma screening and checkup Recommended yearly dental visit for hygiene and checkup  Vaccinations: Influenza vaccine given to day Pneumococcal vaccine up to date, completed Tdap vaccine up to date, due 08/07/2023 Shingles vaccine due    Advanced directives: Please bring [place] a copy of your health care power attorney  Conditions/risks identified: none   Next appointment: Tyson Dense, RN   Preventive Care 65 Years and Older, Female Preventive care refers to lifestyle choices and visits with your health care provider that can promote health and wellness. What does preventive care include?  A yearly physical exam. This is also called an annual well check.  Dental exams once or twice a year.  Routine eye exams. Ask your health care provider how often you should have your eyes checked.  Personal lifestyle choices, including:  Daily care of your teeth and gums.  Regular physical activity.  Eating a healthy diet.  Avoiding tobacco and drug use.  Limiting alcohol use.  Practicing safe sex.  Taking low-dose aspirin every day.  Taking vitamin and mineral supplements as recommended by your health care provider. What happens during an annual well check? The services and screenings done by your health care provider during your annual well check will depend on your age, overall health, lifestyle risk factors, and family history of disease. Counseling  Your health care provider may ask you questions about your:  Alcohol use.  Tobacco use.  Drug  use.  Emotional well-being.  Home and relationship well-being.  Sexual activity.  Eating habits.  History of falls.  Memory and ability to understand (cognition).  Work and work Statistician.  Reproductive health. Screening  You may have the following tests or measurements:  Height, weight, and BMI.  Blood pressure.  Lipid and cholesterol levels. These may be checked every 5 years, or more frequently if you are over 74 years old.  Skin check.  Lung cancer screening. You may have this screening every year starting at age 89 if you have a 30-pack-year history of smoking and currently smoke or have quit within the past 15 years.  Fecal occult blood test (FOBT) of the stool. You may have this test every year starting at age 30.  Flexible sigmoidoscopy or colonoscopy. You may have a sigmoidoscopy every 5 years or a colonoscopy every 10 years starting at age 65.  Hepatitis C blood test.  Hepatitis B blood test.  Sexually transmitted disease (STD) testing.  Diabetes screening. This is done by checking your blood sugar (glucose) after you have not eaten for a while (fasting). You may have this done every 1-3 years.  Bone density scan. This is done to screen for osteoporosis. You may have this done starting at age 70.  Mammogram. This may be done every 1-2 years. Talk to your health care provider about how often you should have regular mammograms. Talk with your health care provider about your test results, treatment options, and if necessary, the need for more tests. Vaccines  Your health care provider may recommend certain vaccines, such as:  Influenza vaccine. This is recommended every year.  Tetanus, diphtheria, and acellular  pertussis (Tdap, Td) vaccine. You may need a Td booster every 10 years.  Zoster vaccine. You may need this after age 90.  Pneumococcal 13-valent conjugate (PCV13) vaccine. One dose is recommended after age 84.  Pneumococcal polysaccharide  (PPSV23) vaccine. One dose is recommended after age 70. Talk to your health care provider about which screenings and vaccines you need and how often you need them. This information is not intended to replace advice given to you by your health care provider. Make sure you discuss any questions you have with your health care provider. Document Released: 01/28/2015 Document Revised: 09/21/2015 Document Reviewed: 11/02/2014 Elsevier Interactive Patient Education  2017 [place] Prevention in the Home Falls can cause injuries. They can happen to people of all ages. There are many things you can do to make your home safe and to help prevent falls. What can I do on the outside of my home?  Regularly fix the edges of walkways and driveways and fix any cracks.  Remove anything that might make you trip as you walk through a door, such as a raised step or threshold.  Trim any bushes or trees on the path to your home.  Use bright outdoor lighting.  Clear any walking paths of anything that might make someone trip, such as rocks or tools.  Regularly check to see if handrails are loose or broken. Make sure that both sides of any steps have handrails.  Any raised decks and porches should have guardrails on the edges.  Have any leaves, snow, or ice cleared regularly.  Use sand or salt on walking paths during winter.  Clean up any spills in your garage right away. This includes oil or grease spills. What can I do in the bathroom?  Use night lights.  Install grab bars by the toilet and in the tub and shower. Do not use towel bars as grab bars.  Use non-skid mats or decals in the tub or shower.  If you need to sit down in the shower, use a plastic, non-slip stool.  Keep the floor dry. Clean up any water that spills on the floor as soon as it happens.  Remove soap buildup in the tub or shower regularly.  Attach bath mats securely with double-sided non-slip rug tape.  Do not have  throw rugs and other things on the floor that can make you trip. What can I do in the bedroom?  Use night lights.  Make sure that you have a light by your bed that is easy to reach.  Do not use any sheets or blankets that are too big for your bed. They should not hang down onto the floor.  Have a firm chair that has side arms. You can use this for support while you get dressed.  Do not have throw rugs and other things on the floor that can make you trip. What can I do in the kitchen?  Clean up any spills right away.  Avoid walking on wet floors.  Keep items that you use a lot in easy-to-reach places.  If you need to reach something above you, use a strong step stool that has a grab bar.  Keep electrical cords out of the way.  Do not use floor polish or wax that makes floors slippery. If you must use wax, use non-skid floor wax.  Do not have throw rugs and other things on the floor that can make you trip. What can I do with my stairs?  Do not leave any items on the stairs.  Make sure that there are handrails on both sides of the stairs and use them. Fix handrails that are broken or loose. Make sure that handrails are as long as the stairways.  Check any carpeting to make sure that it is firmly attached to the stairs. Fix any carpet that is loose or worn.  Avoid having throw rugs at the top or bottom of the stairs. If you do have throw rugs, attach them to the floor with carpet tape.  Make sure that you have a light switch at the top of the stairs and the bottom of the stairs. If you do not have them, ask someone to add them for you. What else can I do to help prevent falls?  Wear shoes that:  Do not have high heels.  Have rubber bottoms.  Are comfortable and fit you well.  Are closed at the toe. Do not wear sandals.  If you use a stepladder:  Make sure that it is fully opened. Do not climb a closed stepladder.  Make sure that both sides of the stepladder are  locked into place.  Ask someone to hold it for you, if possible.  Clearly mark and make sure that you can see:  Any grab bars or handrails.  First and last steps.  Where the edge of each step is.  Use tools that help you move around (mobility aids) if they are needed. These include:  Canes.  Walkers.  Scooters.  Crutches.  Turn on the lights when you go into a dark area. Replace any light bulbs as soon as they burn out.  Set up your furniture so you have a clear path. Avoid moving your furniture around.  If any of your floors are uneven, fix them.  If there are any pets around you, be aware of where they are.  Review your medicines with your doctor. Some medicines can make you feel dizzy. This can increase your chance of falling. Ask your doctor what other things that you can do to help prevent falls. This information is not intended to replace advice given to you by your health care provider. Make sure you discuss any questions you have with your health care provider. Document Released: 10/28/2008 Document Revised: 06/09/2015 Document Reviewed: 02/05/2014 Elsevier Interactive Patient Education  2017 [employer].

## 2017-10-31 NOTE — Patient Instructions (Addendum)
STOP [place]

## 2017-10-31 NOTE — Progress Notes (Signed)
Careteam: Patient Care Team: Lauree Chandler, NP as PCP - General (Geriatric Medicine) Asthma, Velora Heckler Allergy And as Consulting Physician (Allergy and Immunology) Arta Silence, MD as Consulting Physician (Gastroenterology) Paralee Cancel, MD as Consulting Physician (Orthopedic Surgery) Collene Gobble, MD as Consulting Physician (Pulmonary Disease)  Advanced Directive information    Allergies  Allergen Reactions  . Cefuroxime Axetil Hives    Not sure.. Patient can be allergic to it today but a year from now she may not be.  . Lactose Intolerance (Gi) Other (See Comments)    Gi upset  . Clarithromycin Palpitations  . Codeine Palpitations  . Sulfonamide Derivatives Palpitations    Chief Complaint  Patient presents with  . Medical Management of Chronic Issues    4-5 month follow-up, AWV completed today, - falls, - depression, and MMSE 10/30, failed clock drawing   . Medication Management    Discuss Namzaric      HPI: Patient is a 84 y.o. female seen in the office today for routine follow up.   Dementia- continues to decline. Son and his wife care for her, have to bath her and dress her. She still feeds herself. Continues to have hallucinations but do not frighten her except last night for the first time a women was in her room and it made her upset. Does not feel like she is getting any benefit from the namzaric.    Constipation- no issues while using mirlax daily, eating plenty of fruit and vegetables.   htn- controlled on current regimen. Losartan and metoprolol  Insomnia- continues to have sleep issues on occasion and son will give the doxepin only if needed for "extreme cases"   COPD- not using any inhalers and not having any symptoms of cough/shortness of breath.   Weight loss- expected weight to be way down, down 1 lb since last visit. Continues on nutritional supplement.   Neuropathy- controlled on gabapentin 300mg  TID PRN which is effective- does not need  in every day  Son reports they are more on a comfort appropriate   Review of Systems:  Review of Systems  Unable to perform ROS: Dementia    Past Medical History:  Diagnosis Date  . Allergic rhinitis   . Asthma   . Complication of anesthesia    confusion after 04/2012  . GERD (gastroesophageal reflux disease)   . History of urinary infection   . Hyperlipidemia   . Hypertension   . Hypothyroid   . IBS (irritable bowel syndrome)   . Neuropathy    Past Surgical History:  Procedure Laterality Date  . ABDOMINAL HYSTERECTOMY    . BLADDER SURGERY    . CLOSED REDUCTION WRIST FRACTURE Left 05/04/2012   Procedure: CLOSED REDUCTION WRIST;  Surgeon: Mauri Pole, MD;  Location: [facility];  Service: Orthopedics;  Laterality: Left;  with casting  . FOOT SURGERY    . HIP ARTHROPLASTY Left 05/04/2012   Procedure: ARTHROPLASTY BIPOLAR HIP;  Surgeon: Mauri Pole, MD;  Location: [facility];  Service: Orthopedics;  Laterality: Left;  Marland Kitchen MASTECTOMY     double  . NASAL SINUS SURGERY    . OPEN REDUCTION INTERNAL FIXATION (ORIF) DISTAL RADIAL FRACTURE Left 05/29/2012   Procedure: OPEN REDUCTION INTERNAL FIXATION (ORIF) DISTAL RADIAL FRACTURE;  Surgeon: Linna Hoff, MD;  Location: [facility];  Service: Orthopedics;  Laterality: Left;   Social History:   reports that she has never smoked. She has never used smokeless tobacco. She reports that she does  not drink alcohol or use drugs.  Family History  Problem Relation Age of Onset  . CAD Mother   . Hypertension Father   . Arthritis Sister   . Throat cancer Brother     Medications: Patient's Medications  New Prescriptions   No medications on file  Previous Medications   ACETAMINOPHEN (TYLENOL) 500 MG TABLET    Take 500 mg by mouth every 6 (six) hours as needed for mild pain.   ALBUTEROL (PROVENTIL HFA;VENTOLIN HFA) 108 (90 BASE) MCG/ACT INHALER    Inhale 2 puffs into the lungs every 6 (six) hours as needed for wheezing or shortness of breath.    ASPIRIN 325 MG TABLET    Take 1 tablet (325 mg total) by mouth daily.   DOXEPIN (SINEQUAN) 25 MG CAPSULE    Take 25 mg by mouth at bedtime.   ENSURE (ENSURE)    Take 237 mLs by mouth 3 (three) times daily between meals.   FEEDING SUPPLEMENT (BOOST HIGH PROTEIN) LIQD    Take 1 Container by mouth 3 (three) times daily between meals.   GABAPENTIN (NEURONTIN) 300 MG CAPSULE    Take 300 mg by mouth 4 (four) times daily.   MAGNESIUM HYDROXIDE (MILK OF MAGNESIA) 400 MG/5ML SUSPENSION    Take 5 mLs by mouth daily as needed for mild constipation.   MEMANTINE HCL-DONEPEZIL HCL (NAMZARIC) 28-10 MG CP24    Take by mouth daily.   METOPROLOL SUCCINATE (TOPROL-XL) 50 MG 24 HR TABLET    Take 1 tablet (50 mg total) by mouth daily. Take with or immediately following a meal.   POLYETHYLENE GLYCOL (MIRALAX / GLYCOLAX) PACKET    Take 17 g by mouth daily.   SIMVASTATIN (ZOCOR) 40 MG TABLET    Take 1 tablet (40 mg total) by mouth at bedtime.   SYNTHROID 125 MCG TABLET    Take 0.5 tablets (62.5 mcg total) by mouth every morning.  Modified Medications   Modified Medication Previous Medication   LOSARTAN (COZAAR) 25 MG TABLET losartan (COZAAR) 25 MG tablet      Take 1 tablet (25 mg total) by mouth daily.    Take 1 tablet (25 mg total) by mouth daily.  Discontinued Medications   MIRTAZAPINE (REMERON) 15 MG TABLET    Take 1 tablet (15 mg total) by mouth at bedtime.   MOMETASONE-FORMOTEROL (DULERA) 200-5 MCG/ACT AERO    Inhale 2 puffs into the lungs 2 (two) times daily.     Physical Exam:  Vitals:   10/31/17 1528  BP: 122/64  Pulse: 61  Temp: 98.1 F (36.7 C)  TempSrc: Oral  SpO2: 97%  Weight: 99 lb (44.9 kg)  Height: 5\' 8"  (1.727 m)   Body mass index is 15.05 kg/m.  Physical Exam  Constitutional: No distress.  Frail white female, in [place], unsteady  HENT:  Head: Normocephalic and atraumatic.  HOH  Eyes: Pupils are equal, round, and reactive to light. EOM are normal.  glasses  Neck: Neck supple.    Cardiovascular: Normal rate, regular rhythm and intact distal pulses.  Murmur heard. Pulmonary/Chest: Effort normal and breath sounds normal.  Abdominal: Soft. Bowel sounds are normal. She exhibits no distension and no mass. There is no tenderness. There is no rebound and no guarding.  Musculoskeletal: She exhibits edema.  Neurological: She is alert. No cranial nerve deficit.  Oriented to self only   Skin: Skin is warm and dry. There is pallor.  Psychiatric: Cognition and memory are impaired. She exhibits abnormal recent  memory and abnormal remote memory.  Pleasant, confused    Labs reviewed: Basic Metabolic Panel: Recent Labs    01/12/17 1305 01/12/17 1314 06/14/17 1144  NA 139 144 145  K 3.6 3.6 4.3  CL 108 109 109  CO2 23  --  28  GLUCOSE 104* 101* 106  BUN 23* 24* 19  CREATININE 1.04* 1.00 1.08*  CALCIUM 8.9  --  9.2   Liver Function Tests: Recent Labs    01/12/17 1305 06/14/17 1144  AST 25 21  ALT 12* 7  ALKPHOS 50  --   BILITOT 0.8 0.6  PROT 6.3* 6.1  ALBUMIN 3.7  --    No results for input(s): LIPASE, AMYLASE in the last 8760 hours. No results for input(s): AMMONIA in the last 8760 hours. CBC: Recent Labs    01/12/17 1305 01/12/17 1314 06/14/17 1144  WBC 8.7  --  7.8  NEUTROABS 5.3  --  5,000  HGB 12.2 12.2 13.0  HCT 37.8 36.0 39.2  MCV 89.2  --  87.7  PLT 229  --  233   Lipid Panel: Recent Labs    01/13/17 0433  CHOL 184  HDL 64  LDLCALC 99  TRIG 106  CHOLHDL 2.9   TSH: No results for input(s): TSH in the last 8760 hours. A1C: Lab Results  Component Value Date   HGBA1C 5.5 01/13/2017     Assessment/Plan 1. Chronic obstructive airway disease with asthma ([place]) Never been a smoker but exposed to second hand   2. Hereditary and idiopathic peripheral neuropathy -stable on gabapentin, does not use routinely but as needed, some days are worse than others. Son reports at most will use 3 tablets daily - gabapentin (NEURONTIN) 300 MG  capsule; Take 1 capsule (300 mg total) by mouth 3 (three) times daily as needed.  3. Late onset Alzheimer's disease with behavioral disturbance ([place]) Progressive decline. Requiring assistance with all ADLs and mostly staying in bed. living with son and daughter in law, son does not feel like namzaric is providing benefit, will stop at this time. Son reports he wants to focus on comfort at this time. He has liberalized diet. Request DNR status.  - DNR (Do Not Resuscitate)  4. Iron deficiency anemia, unspecified iron deficiency anemia type Will follow up cbc  5. Insomnia, unspecified type -uses doxepin with good results PRN  6. Essential hypertension -controlled on current regimen.  - COMPLETE METABOLIC PANEL WITH GFR  7. Cerebrovascular accident (CVA) due to embolism of left middle cerebral artery (HCC) -continues on ASA 325 mg daily, discussed zocor with son and at this time with like to stop medication due to age and progression of dementia.  - CBC with Differential/Platelets  8. Hypothyroidism due to acquired atrophy of thyroid -continues on synthroid, will follow up TSH - TSH  Next appt: 4 months, sooner if needed  Maleena Eddleman K. [place], [facility] [phone]

## 2017-10-31 NOTE — Progress Notes (Signed)
Subjective:   Joanna [name] is a 84 y.o. female who presents for Medicare Annual (Subsequent) preventive examination  Last AWV-09/07/2016    Objective:     Vitals: BP 122/64 (BP Location: Right Arm, Patient Position: Sitting)   Pulse 61   Temp 98 F (36.7 C) (Oral)   Ht 5\' 8"  (1.727 m)   Wt 99 lb (44.9 kg)   SpO2 97%   BMI 15.05 kg/m   Body mass index is 15.05 kg/m.  Advanced Directives 10/31/2017 01/25/2017 01/12/2017 09/25/2016 09/07/2016 08/28/2016 02/10/2016  Does Patient Have a Medical Advance Directive? Yes No No No No No Yes  Type of Advance Directive [place]  Does patient want to make changes to medical advance directive? No - Patient declined - - - - - No - Patient declined  Copy of [place] in Chart? No - copy requested - - - - - -  Would patient like information on creating a medical advance directive? - - No - Patient declined - Yes (MAU/Ambulatory/Procedural Areas - Information given) - -  Pre-existing out of facility DNR order (yellow form or pink MOST form) - - - - - - -    Tobacco Social History   Tobacco Use  Smoking Status Never Smoker  Smokeless Tobacco Never Used     Counseling given: Not Answered   Clinical Intake:  Pre-visit preparation completed: No  Pain : No/denies pain     Diabetes: No  How often do you need to have someone help you when you read instructions, pamphlets, or other written materials from your doctor or pharmacy?: 3 - Sometimes What is the last grade level you completed in school?: High School  Interpreter Needed?: No  Information entered by :: Tyson Dense, RN  Past Medical History:  Diagnosis Date  . Allergic rhinitis   . Asthma   . Complication of anesthesia    confusion after 04/2012  . GERD (gastroesophageal reflux disease)   . History of urinary infection   . Hyperlipidemia   . Hypertension   . Hypothyroid   . IBS (irritable  bowel syndrome)   . Neuropathy    Past Surgical History:  Procedure Laterality Date  . ABDOMINAL HYSTERECTOMY    . BLADDER SURGERY    . CLOSED REDUCTION WRIST FRACTURE Left 05/04/2012   Procedure: CLOSED REDUCTION WRIST;  Surgeon: Mauri Pole, MD;  Location: [facility];  Service: Orthopedics;  Laterality: Left;  with casting  . FOOT SURGERY    . HIP ARTHROPLASTY Left 05/04/2012   Procedure: ARTHROPLASTY BIPOLAR HIP;  Surgeon: Mauri Pole, MD;  Location[facility];  Service: Orthopedics;  Laterality: Left;  Marland Kitchen MASTECTOMY     double  . NASAL SINUS SURGERY    . OPEN REDUCTION INTERNAL FIXATION (ORIF) DISTAL RADIAL FRACTURE Left 05/29/2012   Procedure: OPEN REDUCTION INTERNAL FIXATION (ORIF) DISTAL RADIAL FRACTURE;  Surgeon: Linna Hoff, MD;  Location: [facility];  Service: Orthopedics;  Laterality: Left;   Family History  Problem Relation Age of Onset  . CAD Mother   . Hypertension Father   . Arthritis Sister   . Throat cancer Brother    Social History   Socioeconomic History  . Marital status: Widowed    Spouse name: Not on file  . Number of children: 2  . Years of education: Not on file  . Highest education level: Not on file  Occupational History  .  Occupation: unemployed  Social Needs  . Financial resource strain: Not hard at all  . Food insecurity:    Worry: Never true    Inability: Never true  . Transportation needs:    Medical: No    Non-medical: No  Tobacco Use  . Smoking status: Never Smoker  . Smokeless tobacco: Never Used  Substance and Sexual Activity  . Alcohol use: No  . Drug use: No  . Sexual activity: Not Currently  Lifestyle  . Physical activity:    Days per week: 0 days    Minutes per session: 0 min  . Stress: Not at all  Relationships  . Social connections:    Talks on phone: More than three times a week    Gets together: More than three times a week    Attends religious service: Never    Active member of club or organization: No    Attends  meetings of clubs or organizations: Never    Relationship status: Widowed  Other Topics Concern  . Not on file  Social History Narrative   Social History      Diet? No restrictions      Do you drink/eat things with caffeine? yes      Marital status?      widowed                              What year were you married?      Do you live in a house, apartment, assisted living, condo, trailer, etc.? apartment      Is it one or more stories? one      How many persons live in your home? 1      Do you have any pets in your home? (please list) no      Highest level of education completed? 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       no                               Type & how often?       Do you have a living will?      Do you have a DNR form?                                  If not, do you want to discuss one?      Do you have signed POA/HPOA for forms?  yes      Functional Status      Do you have difficulty bathing or dressing yourself? No- assistance given to get in and out shower      Do you have difficulty preparing food or eating? no      Do you have difficulty managing your medications? yes      Do you have difficulty managing your finances? yes      Do you have difficulty affording your medications? no    Outpatient Encounter Medications as of 10/31/2017  Medication Sig  . acetaminophen (TYLENOL) 500 MG tablet Take 500 mg by mouth every 6 (six) hours as needed  for mild pain.  Marland Kitchen albuterol (PROVENTIL HFA;VENTOLIN HFA) 108 (90 Base) MCG/ACT inhaler Inhale 2 puffs into the lungs every 6 (six) hours as needed for wheezing or shortness of breath.  Marland Kitchen aspirin 325 MG tablet Take 1 tablet (325 mg total) by mouth daily.  Marland Kitchen ENSURE (ENSURE) Take 237 mLs by mouth 3 (three) times daily between meals.  . gabapentin  (NEURONTIN) 300 MG capsule Take 300 mg by mouth 4 (four) times daily.  Marland Kitchen losartan (COZAAR) 25 MG tablet Take 1 tablet (25 mg total) by mouth daily.  . Memantine HCl-Donepezil HCl (NAMZARIC) 28-10 MG CP24 Take by mouth daily.  . metoprolol succinate (TOPROL-XL) 50 MG 24 hr tablet Take 1 tablet (50 mg total) by mouth daily. Take with or immediately following a meal.  . polyethylene glycol (MIRALAX / GLYCOLAX) packet Take 17 g by mouth daily.  . simvastatin (ZOCOR) 40 MG tablet Take 1 tablet (40 mg total) by mouth at bedtime.  Marland Kitchen SYNTHROID 125 MCG tablet Take 0.5 tablets (62.5 mcg total) by mouth every morning.  . [DISCONTINUED] losartan (COZAAR) 25 MG tablet Take 1 tablet (25 mg total) by mouth daily.  Marland Kitchen doxepin (SINEQUAN) 25 MG capsule Take 25 mg by mouth at bedtime.  . feeding supplement (BOOST HIGH PROTEIN) LIQD Take 1 Container by mouth 3 (three) times daily between meals.  . magnesium hydroxide (MILK OF MAGNESIA) 400 MG/5ML suspension Take 5 mLs by mouth daily as needed for mild constipation.  . mirtazapine (REMERON) 15 MG tablet Take 1 tablet (15 mg total) by mouth at bedtime. (Patient not taking: Reported on 10/31/2017)  . mometasone-formoterol (DULERA) 200-5 MCG/ACT AERO Inhale 2 puffs into the lungs 2 (two) times daily. (Patient not taking: Reported on 10/31/2017)   No facility-administered encounter medications on file as of 10/31/2017.     Activities of Daily Living In your present state of health, do you have any difficulty performing the following activities: 10/31/2017  Hearing? N  Vision? N  Difficulty concentrating or making decisions? Y  Walking or climbing stairs? Y  Dressing or bathing? Y  Doing errands, shopping? Y  Preparing Food and eating ? Y  Using the Toilet? Y  In the past six months, have you accidently leaked urine? Y  Do you have problems with loss of bowel control? Y  Managing your Medications? Y  Managing your Finances? Y  Housekeeping or managing your  Housekeeping? Y  Some recent data might be hidden    Patient Care Team: Lauree Chandler, NP as PCP - General (Geriatric Medicine) Asthma, Velora Heckler Allergy And as Consulting Physician (Allergy and Immunology) Arta Silence, MD as Consulting Physician (Gastroenterology) Paralee Cancel, MD as Consulting Physician (Orthopedic Surgery) Collene Gobble, MD as Consulting Physician (Pulmonary Disease)    Assessment:   This is a routine wellness examination for [facility].  Exercise Activities and Dietary recommendations Current Exercise Habits: The patient does not participate in regular exercise at present, Exercise limited by: neurologic condition(s);orthopedic condition(s)  Goals    . Exercise 3x per week (30 min per time)     Patient will start walking with family throughout the week       Fall Risk Fall Risk  10/31/2017 06/14/2017 12/27/2016 09/25/2016 09/07/2016  Falls in the past year? No Yes No Yes Yes  Number falls in past yr: - 2 or more - 2 or more 1  Injury with Fall? - No - Yes Yes  Comment - - - - R ankle  Risk Factor  Category  - High Fall Risk - - -   Is the patient's home free of loose throw rugs in walkways, pet beds, electrical cords, etc?   yes      Grab bars in the bathroom? yes      Handrails on the stairs?   yes      Adequate lighting?   yes   Depression Screen PHQ 2/9 Scores 10/31/2017 12/27/2016 09/07/2016  PHQ - 2 Score 0 0 1     Cognitive Function MMSE - Mini Mental State Exam 10/31/2017 09/07/2016  Orientation to time 0 2  Orientation to Place 2 1  Registration 3 3  Attention/ Calculation 0 5  Recall 0 0  Language- name 2 objects 2 2  Language- repeat 0 0  Language- follow 3 step command 3 3  Language- read & follow direction 0 1  Write a sentence 0 1  Copy design 0 1  Total score 10 19        Immunization History  Administered Date(s) Administered  . Influenza Split 09/25/2011  . Influenza Whole 10/18/2008, 10/18/2009, 09/16/2010  .  Influenza, High Dose Seasonal PF 02/07/2016, 09/07/2016, 10/31/2017  . Influenza,inj,Quad PF,6+ Mos 10/21/2012, 09/24/2014  . Influenza-Unspecified 09/10/2013  . Pneumococcal Conjugate-13 09/30/2013  . Pneumococcal Polysaccharide-23 10/15/2005  . Tdap 08/06/2013    Qualifies for Shingles Vaccine? Yes, educated and will think about it  Screening Tests Health Maintenance  Topic Date Due  . TETANUS/TDAP  08/07/2023  . INFLUENZA VACCINE  Completed  . DEXA SCAN  Completed  . PNA vac Low Risk Adult  Completed    Cancer Screenings: Lung: Low Dose CT Chest recommended if Age 2-80 years, 30 pack-year currently smoking OR have quit w/in 15years. Patient does not qualify. Breast:  Up to date on Mammogram? Yes   Up to date of Bone Density/Dexa? Yes Colorectal: up to date  Additional Screenings:  Hepatitis C Screening: declined Flu vaccine due: high dose given     Plan:    I have personally reviewed and addressed the Medicare Annual Wellness questionnaire and have noted the following in the patient's chart:  A. Medical and social history B. Use of alcohol, tobacco or illicit drugs  C. Current medications and supplements D. Functional ability and status E.  Nutritional status F.  Physical activity G. Advance directives H. List of other physicians I.  Hospitalizations, surgeries, and ER visits in previous 12 months J.  [place] to include hearing, vision, cognitive, depression L. Referrals and appointments - none  In addition, I have reviewed and discussed with patient certain preventive protocols, quality metrics, and best practice recommendations. A written personalized care plan for preventive services as well as general preventive health recommendations were provided to patient.  See attached scanned questionnaire for additional information.   Signed,   Tyson Dense, RN Nurse Health Advisor  Patient concerns: patient's son says her memory is still a concern  and would possibly like to d/c memory medication

## 2017-12-08 ENCOUNTER — Other Ambulatory Visit: Payer: Self-pay | Admitting: Internal Medicine

## 2017-12-09 ENCOUNTER — Other Ambulatory Visit: Payer: Self-pay | Admitting: *Deleted

## 2017-12-09 DIAGNOSIS — G609 Hereditary and idiopathic neuropathy, unspecified: Secondary | ICD-10-CM

## 2017-12-09 MED ORDER — GABAPENTIN 300 MG PO CAPS: 300.0000 mg | ORAL_CAPSULE | Freq: Three times a day (TID) | ORAL | 1 refills | Status: DC | PRN

## 2018-03-03 ENCOUNTER — Ambulatory Visit: Payer: Medicare Other | Admitting: Nurse Practitioner

## 2018-04-07 ENCOUNTER — Other Ambulatory Visit: Payer: Self-pay | Admitting: Internal Medicine

## 2018-04-07 DIAGNOSIS — G609 Hereditary and idiopathic neuropathy, unspecified: Secondary | ICD-10-CM

## 2018-04-13 ENCOUNTER — Other Ambulatory Visit: Payer: Self-pay | Admitting: Nurse Practitioner

## 2018-04-14 ENCOUNTER — Ambulatory Visit (INDEPENDENT_AMBULATORY_CARE_PROVIDER_SITE_OTHER): Payer: Medicare Other | Admitting: Nurse Practitioner

## 2018-04-14 ENCOUNTER — Other Ambulatory Visit: Payer: Self-pay

## 2018-04-14 ENCOUNTER — Encounter: Payer: Self-pay | Admitting: Nurse Practitioner

## 2018-04-14 DIAGNOSIS — J449 Chronic obstructive pulmonary disease, unspecified: Secondary | ICD-10-CM | POA: Diagnosis not present

## 2018-04-14 DIAGNOSIS — I10 Essential (primary) hypertension: Secondary | ICD-10-CM | POA: Diagnosis not present

## 2018-04-14 DIAGNOSIS — F0281 Dementia in other diseases classified elsewhere with behavioral disturbance: Secondary | ICD-10-CM

## 2018-04-14 DIAGNOSIS — G301 Alzheimer's disease with late onset: Secondary | ICD-10-CM | POA: Diagnosis not present

## 2018-04-14 DIAGNOSIS — E039 Hypothyroidism, unspecified: Secondary | ICD-10-CM | POA: Diagnosis not present

## 2018-04-14 DIAGNOSIS — G609 Hereditary and idiopathic neuropathy, unspecified: Secondary | ICD-10-CM

## 2018-04-14 NOTE — Progress Notes (Signed)
This service is provided via telemedicine  No vital signs collected/recorded due to the encounter was a telemedicine visit.   Location of patient (ex: home, work): Home  Patient consents to a telephone visit:  Joanna [name]   Location of the provider (ex: office, home): [facility], Office   Name of any referring provider:  Sherrie Mustache, NP   Names of all persons participating in the telemedicine service and their role in the encounter:  S.Chrae B/CMA, Dewaine Oats Carlos American, NP, Joanna [name] (son), and Joanna [name] (daughter in law)  Time spent on call:  7 min, 49 sec   Virtual Visit via Telephone Note  I connected with Joanna [name] on 04/14/18 at  1:00 PM EDT by telephone and verified that I am speaking with the correct person using two identifiers.   I discussed the limitations, risks, security and privacy concerns of performing an evaluation and management service by telephone and the availability of in person appointments. I also discussed with the patient that there may be a patient responsible charge related to this service. The patient expressed understanding and agreed to proceed.      Careteam: Patient Care Team: Lauree Chandler, NP as PCP - General (Geriatric Medicine) Asthma, Velora Heckler All[facility] And as Consulting Physician (Allergy and Immunology) Arta Silence, MD as Consulting Physician (Gastroenterology) Paralee Cancel, MD as Consulting Physician (Orthopedic Surgery) Collene Gobble, MD as Consulting Physician (Pulmonary Disease)  Advanced Directive information    Allergies  Allergen Reactions  . Cefuroxime Axetil Hives    Not sure.. Patient can be allergic to it today but a year from now she may not be.  . Lactose Intolerance (Gi) Other (See Comments)    Gi upset  . Clarithromycin Palpitations  . Codeine Palpitations  . Sulfonamide Derivatives Palpitations    Chief Complaint  Patient presents with  . Medical Management of Chronic Issues    5 month  follow-up   . URI    Patient with productive cough-green      HPI: Patient is a 85 y.o. female for routine follow up on chronic condition. Pts son Joanna [name] provides much of the information.   hypernatremia sodium was elevated at last OV son reports they stopped all added sodium.   Dementia- continues to decline. Son and his wife care for her, reports end stage. No longer talking, walking. Son has to bath her, dress her and feed her currently. Losing most of her functions. Comfort approach.   Constipation- no longer an issue. Does not need medication.  htn- continues on Losartan and metoprolol. Does not take blood pressure at home.   Insomnia- no longer an issue, no taking medication   COPD- not using any inhalers and not having any symptoms of cough/shortness of breath.   Weight loss- son does not know if she has lost more weight, eats well when she is fed.   Neuropathy- controlled on gabapentin 300mg  TID PRN which is effective- does not need in every day. Does not complain of pain.  Hypothyroid- continues on synthroid 125 mcg daily.    Review of Systems: provided by son.  Review of Systems  Constitutional: Negative for chills, fever and weight loss.  HENT: Negative for tinnitus.        Trouble swallowing  Respiratory: Positive for cough (with swallowing). Negative for sputum production and shortness of breath.   Cardiovascular: Negative for chest pain, palpitations and leg swelling.  Gastrointestinal: Negative for abdominal pain, constipation, diarrhea and heartburn.  Genitourinary: Negative  for dysuria, frequency and urgency.       Incontinence of bowel and bladder  Musculoskeletal: Negative for back pain, falls, joint pain and myalgias.  Skin: Negative.   Neurological: Negative for dizziness and headaches.  Psychiatric/Behavioral: Positive for memory loss. The patient does not have insomnia.     Past Medical History:  Diagnosis Date  . Allergic rhinitis   . Asthma    . Complication of anesthesia    confusion after 04/2012  . GERD (gastroesophageal reflux disease)   . History of urinary infection   . Hyperlipidemia   . Hypertension   . Hypothyroid   . IBS (irritable bowel syndrome)   . Neuropathy    Past Surgical History:  Procedure Laterality Date  . ABDOMINAL HYSTERECTOMY    . BLADDER SURGERY    . CLOSED REDUCTION WRIST FRACTURE Left 05/04/2012   Procedure: CLOSED REDUCTION WRIST;  Surgeon: Mauri Pole, MD;  Location: [facility];  Service: Orthopedics;  Laterality: Left;  with casting  . FOOT SURGERY    . HIP ARTHROPLASTY Left 05/04/2012   Procedure: ARTHROPLASTY BIPOLAR HIP;  Surgeon: Mauri Pole, MD;  Location: [facility];  Service: Orthopedics;  Laterality: Left;  Marland Kitchen MASTECTOMY     double  . NASAL SINUS SURGERY    . OPEN REDUCTION INTERNAL FIXATION (ORIF) DISTAL RADIAL FRACTURE Left 05/29/2012   Procedure: OPEN REDUCTION INTERNAL FIXATION (ORIF) DISTAL RADIAL FRACTURE;  Surgeon: Linna Hoff, MD;  Location: [facility];  Service: Orthopedics;  Laterality: Left;   Social History:   reports that she has never smoked. She has never used smokeless tobacco. She reports that she does not drink alcohol or use drugs.  Family History  Problem Relation Age of Onset  . CAD Mother   . Hypertension Father   . Arthritis Sister   . Throat cancer Brother     Medications: Patient's Medications  New Prescriptions   No medications on file  Previous Medications   ACETAMINOPHEN (TYLENOL) 500 MG TABLET    Take 500 mg by mouth every 6 (six) hours as needed for mild pain.   ALBUTEROL (PROVENTIL HFA;VENTOLIN HFA) 108 (90 BASE) MCG/ACT INHALER    Inhale 2 puffs into the lungs every 6 (six) hours as needed for wheezing or shortness of breath.   ASPIRIN 325 MG TABLET    Take 1 tablet (325 mg total) by mouth daily.   ENSURE (ENSURE)    Take 237 mLs by mouth 3 (three) times daily between meals.   GABAPENTIN (NEURONTIN) 300 MG CAPSULE    TAKE 1 CAPSULE BY MOUTH 3  TIMES  DAILY AS NEEDED   LOSARTAN (COZAAR) 25 MG TABLET    TAKE 1 TABLET BY MOUTH  DAILY   METOPROLOL SUCCINATE (TOPROL-XL) 50 MG 24 HR TABLET    TAKE 1 TABLET BY MOUTH  DAILY WITH OR IMMEDIATLEY  FOLLOWING A MEAL   SYNTHROID 125 MCG TABLET    Take 0.5 tablets (62.5 mcg total) by mouth every morning.  Modified Medications   No medications on file  Discontinued Medications   DOXEPIN (SINEQUAN) 25 MG CAPSULE    Take 25 mg by mouth at bedtime.   FEEDING SUPPLEMENT (BOOST HIGH PROTEIN) LIQD    Take 1 Container by mouth 3 (three) times daily between meals.   MAGNESIUM HYDROXIDE (MILK OF MAGNESIA) 400 MG/5ML SUSPENSION    Take 5 mLs by mouth daily as needed for mild constipation.   POLYETHYLENE GLYCOL (MIRALAX / GLYCOLAX) PACKET  Take 17 g by mouth daily.     Physical Exam:Unable due to being a televisit  Labs reviewed: Basic Metabolic Panel: Recent Labs    06/14/17 1144 10/31/17 1612  NA 145 149*  K 4.3 3.9  CL 109 112*  CO2 28 32  GLUCOSE 106 122  BUN 19 24  CREATININE 1.08* 1.02*  CALCIUM 9.2 8.9  TSH  --  1.24   Liver Function Tests: Recent Labs    06/14/17 1144 10/31/17 1612  AST 21 20  ALT 7 8  BILITOT 0.6 0.4  PROT 6.1 6.1   No results for input(s): LIPASE, AMYLASE in the last 8760 hours. No results for input(s): AMMONIA in the last 8760 hours. CBC: Recent Labs    06/14/17 1144 10/31/17 1612  WBC 7.8 6.3  NEUTROABS 5,000 3,635  HGB 13.0 13.2  HCT 39.2 40.5  MCV 87.7 88.8  PLT 233 247   Lipid Panel: No results for input(s): CHOL, HDL, LDLCALC, TRIG, CHOLHDL, LDLDIRECT in the last 8760 hours. TSH: Recent Labs    10/31/17 1612  TSH 1.24   A1C: Lab Results  Component Value Date   HGBA1C 5.5 01/13/2017     Assessment/Plan 1. Late onset Alzheimer's disease with behavioral disturbance [facility]) Son is primary caregiver, provides 24/7 care. She is total care. Now nonverbal and son has noticed she has difficulty swallowing. Comfort approach. - Ambulatory  referral to Hospice  2. Essential hypertension continues on losartan and toprol, does not take blood pressure at home.   3. Chronic obstructive airway disease with asthma (HCC) Stable, without increase in shortness of breath or recent exacerbation.   4. Hereditary and idiopathic peripheral neuropathy Controlled on gabapentin.   5. Acquired hypothyroidism Continues on synthroid    Atleigh Gruen K. Harle Battiest  [facility] [phone]   Follow Up Instructions:    I discussed the assessment and treatment plan with the patient. The patient was provided an opportunity to ask questions and all were answered. The patient agreed with the plan and demonstrated an understanding of the instructions.   The patient was advised to call back or seek an in-person evaluation if the symptoms worsen or if the condition fails to improve as anticipated.  I provided 15 minutes of non-face-to-face time during this encounter.   Leigh Aurora [place], [place]

## 2018-04-14 NOTE — Patient Instructions (Signed)
Hospice referral has been placed, they will be in touch with you.

## 2018-04-18 DIAGNOSIS — E039 Hypothyroidism, unspecified: Secondary | ICD-10-CM | POA: Diagnosis not present

## 2018-04-18 DIAGNOSIS — G309 Alzheimer's disease, unspecified: Secondary | ICD-10-CM | POA: Diagnosis not present

## 2018-04-18 DIAGNOSIS — I69318 Other symptoms and signs involving cognitive functions following cerebral infarction: Secondary | ICD-10-CM | POA: Diagnosis not present

## 2018-04-18 DIAGNOSIS — F0280 Dementia in other diseases classified elsewhere without behavioral disturbance: Secondary | ICD-10-CM | POA: Diagnosis not present

## 2018-04-18 DIAGNOSIS — J449 Chronic obstructive pulmonary disease, unspecified: Secondary | ICD-10-CM | POA: Diagnosis not present

## 2018-04-18 DIAGNOSIS — I10 Essential (primary) hypertension: Secondary | ICD-10-CM | POA: Diagnosis not present

## 2018-04-18 DIAGNOSIS — E43 Unspecified severe protein-calorie malnutrition: Secondary | ICD-10-CM | POA: Diagnosis not present

## 2018-04-21 DIAGNOSIS — I10 Essential (primary) hypertension: Secondary | ICD-10-CM | POA: Diagnosis not present

## 2018-04-21 DIAGNOSIS — F0280 Dementia in other diseases classified elsewhere without behavioral disturbance: Secondary | ICD-10-CM | POA: Diagnosis not present

## 2018-04-21 DIAGNOSIS — J449 Chronic obstructive pulmonary disease, unspecified: Secondary | ICD-10-CM | POA: Diagnosis not present

## 2018-04-21 DIAGNOSIS — E43 Unspecified severe protein-calorie malnutrition: Secondary | ICD-10-CM | POA: Diagnosis not present

## 2018-04-21 DIAGNOSIS — I69318 Other symptoms and signs involving cognitive functions following cerebral infarction: Secondary | ICD-10-CM | POA: Diagnosis not present

## 2018-04-21 DIAGNOSIS — G309 Alzheimer's disease, unspecified: Secondary | ICD-10-CM | POA: Diagnosis not present

## 2018-05-10 ENCOUNTER — Other Ambulatory Visit: Payer: Self-pay | Admitting: Internal Medicine

## 2018-05-16 DIAGNOSIS — G309 Alzheimer's disease, unspecified: Secondary | ICD-10-CM | POA: Diagnosis not present

## 2018-05-16 DIAGNOSIS — F0280 Dementia in other diseases classified elsewhere without behavioral disturbance: Secondary | ICD-10-CM | POA: Diagnosis not present

## 2018-05-16 DIAGNOSIS — E039 Hypothyroidism, unspecified: Secondary | ICD-10-CM | POA: Diagnosis not present

## 2018-05-16 DIAGNOSIS — J449 Chronic obstructive pulmonary disease, unspecified: Secondary | ICD-10-CM | POA: Diagnosis not present

## 2018-05-16 DIAGNOSIS — E43 Unspecified severe protein-calorie malnutrition: Secondary | ICD-10-CM | POA: Diagnosis not present

## 2018-05-16 DIAGNOSIS — I10 Essential (primary) hypertension: Secondary | ICD-10-CM | POA: Diagnosis not present

## 2018-05-16 DIAGNOSIS — I69318 Other symptoms and signs involving cognitive functions following cerebral infarction: Secondary | ICD-10-CM | POA: Diagnosis not present

## 2018-06-06 DIAGNOSIS — J449 Chronic obstructive pulmonary disease, unspecified: Secondary | ICD-10-CM | POA: Diagnosis not present

## 2018-06-06 DIAGNOSIS — I69318 Other symptoms and signs involving cognitive functions following cerebral infarction: Secondary | ICD-10-CM | POA: Diagnosis not present

## 2018-06-06 DIAGNOSIS — I10 Essential (primary) hypertension: Secondary | ICD-10-CM | POA: Diagnosis not present

## 2018-06-06 DIAGNOSIS — E43 Unspecified severe protein-calorie malnutrition: Secondary | ICD-10-CM | POA: Diagnosis not present

## 2018-06-06 DIAGNOSIS — F0280 Dementia in other diseases classified elsewhere without behavioral disturbance: Secondary | ICD-10-CM | POA: Diagnosis not present

## 2018-06-06 DIAGNOSIS — G309 Alzheimer's disease, unspecified: Secondary | ICD-10-CM | POA: Diagnosis not present

## 2018-06-07 DIAGNOSIS — J449 Chronic obstructive pulmonary disease, unspecified: Secondary | ICD-10-CM | POA: Diagnosis not present

## 2018-06-07 DIAGNOSIS — I10 Essential (primary) hypertension: Secondary | ICD-10-CM | POA: Diagnosis not present

## 2018-06-07 DIAGNOSIS — G309 Alzheimer's disease, unspecified: Secondary | ICD-10-CM | POA: Diagnosis not present

## 2018-06-07 DIAGNOSIS — E43 Unspecified severe protein-calorie malnutrition: Secondary | ICD-10-CM | POA: Diagnosis not present

## 2018-06-07 DIAGNOSIS — I69318 Other symptoms and signs involving cognitive functions following cerebral infarction: Secondary | ICD-10-CM | POA: Diagnosis not present

## 2018-06-07 DIAGNOSIS — F0280 Dementia in other diseases classified elsewhere without behavioral disturbance: Secondary | ICD-10-CM | POA: Diagnosis not present

## 2018-06-08 DIAGNOSIS — I10 Essential (primary) hypertension: Secondary | ICD-10-CM | POA: Diagnosis not present

## 2018-06-08 DIAGNOSIS — G309 Alzheimer's disease, unspecified: Secondary | ICD-10-CM | POA: Diagnosis not present

## 2018-06-08 DIAGNOSIS — E43 Unspecified severe protein-calorie malnutrition: Secondary | ICD-10-CM | POA: Diagnosis not present

## 2018-06-08 DIAGNOSIS — I69318 Other symptoms and signs involving cognitive functions following cerebral infarction: Secondary | ICD-10-CM | POA: Diagnosis not present

## 2018-06-08 DIAGNOSIS — F0280 Dementia in other diseases classified elsewhere without behavioral disturbance: Secondary | ICD-10-CM | POA: Diagnosis not present

## 2018-06-08 DIAGNOSIS — J449 Chronic obstructive pulmonary disease, unspecified: Secondary | ICD-10-CM | POA: Diagnosis not present

## 2018-06-09 DIAGNOSIS — I10 Essential (primary) hypertension: Secondary | ICD-10-CM | POA: Diagnosis not present

## 2018-06-09 DIAGNOSIS — I69318 Other symptoms and signs involving cognitive functions following cerebral infarction: Secondary | ICD-10-CM | POA: Diagnosis not present

## 2018-06-09 DIAGNOSIS — F0280 Dementia in other diseases classified elsewhere without behavioral disturbance: Secondary | ICD-10-CM | POA: Diagnosis not present

## 2018-06-09 DIAGNOSIS — J449 Chronic obstructive pulmonary disease, unspecified: Secondary | ICD-10-CM | POA: Diagnosis not present

## 2018-06-09 DIAGNOSIS — G309 Alzheimer's disease, unspecified: Secondary | ICD-10-CM | POA: Diagnosis not present

## 2018-06-09 DIAGNOSIS — E43 Unspecified severe protein-calorie malnutrition: Secondary | ICD-10-CM | POA: Diagnosis not present

## 2018-06-10 DIAGNOSIS — F0280 Dementia in other diseases classified elsewhere without behavioral disturbance: Secondary | ICD-10-CM | POA: Diagnosis not present

## 2018-06-10 DIAGNOSIS — G309 Alzheimer's disease, unspecified: Secondary | ICD-10-CM | POA: Diagnosis not present

## 2018-06-10 DIAGNOSIS — E43 Unspecified severe protein-calorie malnutrition: Secondary | ICD-10-CM | POA: Diagnosis not present

## 2018-06-10 DIAGNOSIS — J449 Chronic obstructive pulmonary disease, unspecified: Secondary | ICD-10-CM | POA: Diagnosis not present

## 2018-06-10 DIAGNOSIS — I10 Essential (primary) hypertension: Secondary | ICD-10-CM | POA: Diagnosis not present

## 2018-06-10 DIAGNOSIS — I69318 Other symptoms and signs involving cognitive functions following cerebral infarction: Secondary | ICD-10-CM | POA: Diagnosis not present

## 2018-06-11 DIAGNOSIS — F0280 Dementia in other diseases classified elsewhere without behavioral disturbance: Secondary | ICD-10-CM | POA: Diagnosis not present

## 2018-06-11 DIAGNOSIS — E43 Unspecified severe protein-calorie malnutrition: Secondary | ICD-10-CM | POA: Diagnosis not present

## 2018-06-11 DIAGNOSIS — I69318 Other symptoms and signs involving cognitive functions following cerebral infarction: Secondary | ICD-10-CM | POA: Diagnosis not present

## 2018-06-11 DIAGNOSIS — J449 Chronic obstructive pulmonary disease, unspecified: Secondary | ICD-10-CM | POA: Diagnosis not present

## 2018-06-11 DIAGNOSIS — G309 Alzheimer's disease, unspecified: Secondary | ICD-10-CM | POA: Diagnosis not present

## 2018-06-11 DIAGNOSIS — I10 Essential (primary) hypertension: Secondary | ICD-10-CM | POA: Diagnosis not present

## 2018-06-12 DIAGNOSIS — I69318 Other symptoms and signs involving cognitive functions following cerebral infarction: Secondary | ICD-10-CM | POA: Diagnosis not present

## 2018-06-12 DIAGNOSIS — F0280 Dementia in other diseases classified elsewhere without behavioral disturbance: Secondary | ICD-10-CM | POA: Diagnosis not present

## 2018-06-12 DIAGNOSIS — E43 Unspecified severe protein-calorie malnutrition: Secondary | ICD-10-CM | POA: Diagnosis not present

## 2018-06-12 DIAGNOSIS — I10 Essential (primary) hypertension: Secondary | ICD-10-CM | POA: Diagnosis not present

## 2018-06-12 DIAGNOSIS — G309 Alzheimer's disease, unspecified: Secondary | ICD-10-CM | POA: Diagnosis not present

## 2018-06-12 DIAGNOSIS — J449 Chronic obstructive pulmonary disease, unspecified: Secondary | ICD-10-CM | POA: Diagnosis not present

## 2018-06-13 DIAGNOSIS — E43 Unspecified severe protein-calorie malnutrition: Secondary | ICD-10-CM | POA: Diagnosis not present

## 2018-06-13 DIAGNOSIS — F0280 Dementia in other diseases classified elsewhere without behavioral disturbance: Secondary | ICD-10-CM | POA: Diagnosis not present

## 2018-06-13 DIAGNOSIS — G309 Alzheimer's disease, unspecified: Secondary | ICD-10-CM | POA: Diagnosis not present

## 2018-06-13 DIAGNOSIS — J449 Chronic obstructive pulmonary disease, unspecified: Secondary | ICD-10-CM | POA: Diagnosis not present

## 2018-06-13 DIAGNOSIS — I69318 Other symptoms and signs involving cognitive functions following cerebral infarction: Secondary | ICD-10-CM | POA: Diagnosis not present

## 2018-06-13 DIAGNOSIS — I10 Essential (primary) hypertension: Secondary | ICD-10-CM | POA: Diagnosis not present

## 2018-06-15 ENCOUNTER — Other Ambulatory Visit: Payer: Self-pay | Admitting: Nurse Practitioner

## 2018-06-15 DIAGNOSIS — G609 Hereditary and idiopathic neuropathy, unspecified: Secondary | ICD-10-CM

## 2018-06-16 DEATH — deceased

## 2018-11-03 ENCOUNTER — Ambulatory Visit: Payer: Self-pay

## 2018-11-03 ENCOUNTER — Encounter: Payer: Medicare Other | Admitting: Nurse Practitioner
